# Patient Record
Sex: Female | Born: 1965 | Race: White | Hispanic: No | Smoking: Former smoker
Health system: Southern US, Community
[De-identification: ages and names within clinical notes are randomized; demographics above are authoritative.]

## PROBLEM LIST (undated history)

## (undated) DIAGNOSIS — C801 Malignant (primary) neoplasm, unspecified: Secondary | ICD-10-CM

## (undated) DIAGNOSIS — Z789 Other specified health status: Secondary | ICD-10-CM

## (undated) DIAGNOSIS — D649 Anemia, unspecified: Secondary | ICD-10-CM

## (undated) DIAGNOSIS — C539 Malignant neoplasm of cervix uteri, unspecified: Secondary | ICD-10-CM

## (undated) HISTORY — PX: MASS EXCISION: SHX2000

## (undated) HISTORY — PX: TONSILLECTOMY: SUR1361

## (undated) HISTORY — DX: Other specified health status: Z78.9

---

## 1998-04-24 ENCOUNTER — Emergency Department (HOSPITAL_COMMUNITY): Admission: EM | Admit: 1998-04-24 | Discharge: 1998-04-24 | Payer: Self-pay | Admitting: Internal Medicine

## 2008-08-10 ENCOUNTER — Emergency Department: Payer: Self-pay | Admitting: Emergency Medicine

## 2014-03-21 ENCOUNTER — Emergency Department: Payer: Self-pay | Admitting: Emergency Medicine

## 2018-12-07 DIAGNOSIS — J069 Acute upper respiratory infection, unspecified: Secondary | ICD-10-CM | POA: Diagnosis not present

## 2018-12-07 DIAGNOSIS — J0140 Acute pansinusitis, unspecified: Secondary | ICD-10-CM | POA: Diagnosis not present

## 2019-04-02 DIAGNOSIS — R109 Unspecified abdominal pain: Secondary | ICD-10-CM | POA: Diagnosis not present

## 2019-04-02 DIAGNOSIS — M545 Low back pain: Secondary | ICD-10-CM | POA: Diagnosis not present

## 2019-04-02 DIAGNOSIS — K5900 Constipation, unspecified: Secondary | ICD-10-CM | POA: Diagnosis not present

## 2019-04-03 DIAGNOSIS — R109 Unspecified abdominal pain: Secondary | ICD-10-CM | POA: Diagnosis not present

## 2019-04-06 ENCOUNTER — Telehealth: Payer: Self-pay | Admitting: Nurse Practitioner

## 2019-04-06 DIAGNOSIS — R1033 Periumbilical pain: Secondary | ICD-10-CM | POA: Diagnosis not present

## 2019-04-06 DIAGNOSIS — M545 Low back pain: Secondary | ICD-10-CM | POA: Diagnosis not present

## 2019-04-06 DIAGNOSIS — R1084 Generalized abdominal pain: Secondary | ICD-10-CM | POA: Diagnosis not present

## 2019-04-06 DIAGNOSIS — F17210 Nicotine dependence, cigarettes, uncomplicated: Secondary | ICD-10-CM | POA: Diagnosis not present

## 2019-04-06 DIAGNOSIS — R000 Tachycardia, unspecified: Secondary | ICD-10-CM | POA: Diagnosis not present

## 2019-04-06 DIAGNOSIS — R140 Abdominal distension (gaseous): Secondary | ICD-10-CM | POA: Diagnosis not present

## 2019-04-06 NOTE — Telephone Encounter (Signed)
Called pt to go over Script Screening for Covid-19, no answer left vm

## 2019-04-09 ENCOUNTER — Ambulatory Visit (INDEPENDENT_AMBULATORY_CARE_PROVIDER_SITE_OTHER): Payer: BLUE CROSS/BLUE SHIELD | Admitting: Nurse Practitioner

## 2019-04-09 ENCOUNTER — Other Ambulatory Visit: Payer: Self-pay

## 2019-04-09 ENCOUNTER — Ambulatory Visit
Admission: RE | Admit: 2019-04-09 | Discharge: 2019-04-09 | Disposition: A | Discharge status: Home / Self Care | Payer: BLUE CROSS/BLUE SHIELD | Source: Ambulatory Visit | Attending: Nurse Practitioner | Admitting: Nurse Practitioner

## 2019-04-09 ENCOUNTER — Encounter: Payer: Self-pay | Admitting: Nurse Practitioner

## 2019-04-09 VITALS — BP 126/80 | HR 94 | Temp 98.2°F | Ht 61.0 in | Wt 120.0 lb

## 2019-04-09 DIAGNOSIS — F17210 Nicotine dependence, cigarettes, uncomplicated: Secondary | ICD-10-CM | POA: Diagnosis not present

## 2019-04-09 DIAGNOSIS — Z1329 Encounter for screening for other suspected endocrine disorder: Secondary | ICD-10-CM | POA: Diagnosis not present

## 2019-04-09 DIAGNOSIS — M545 Low back pain, unspecified: Secondary | ICD-10-CM

## 2019-04-09 DIAGNOSIS — Z114 Encounter for screening for human immunodeficiency virus [HIV]: Secondary | ICD-10-CM

## 2019-04-09 DIAGNOSIS — R1084 Generalized abdominal pain: Secondary | ICD-10-CM

## 2019-04-09 DIAGNOSIS — Z13220 Encounter for screening for lipoid disorders: Secondary | ICD-10-CM

## 2019-04-09 DIAGNOSIS — E559 Vitamin D deficiency, unspecified: Secondary | ICD-10-CM

## 2019-04-09 LAB — UA/M W/RFLX CULTURE, ROUTINE
Bilirubin, UA: NEGATIVE
Glucose, UA: NEGATIVE
Ketones, UA: NEGATIVE
Leukocytes,UA: NEGATIVE
Nitrite, UA: NEGATIVE
Protein,UA: NEGATIVE
RBC, UA: NEGATIVE
Specific Gravity, UA: <1.005 — ABNORMAL LOW (ref 1.005–1.030)
Urobilinogen, Ur: 0.2 mg/dL (ref 0.2–1.0)
pH, UA: 6.5 (ref 5.0–7.5)

## 2019-04-09 MED ORDER — CYCLOBENZAPRINE HCL 10 MG PO TABS: 10.0000 mg | ORAL_TABLET | Freq: Three times a day (TID) | ORAL | 1 refills | Status: DC | PRN

## 2019-04-09 MED ORDER — MELOXICAM 7.5 MG PO TABS: 7.5000 mg | ORAL_TABLET | Freq: Two times a day (BID) | ORAL | 0 refills | Status: DC

## 2019-04-09 NOTE — Assessment & Plan Note (Signed)
I have recommended complete cessation of tobacco use. I have discussed various options available for assistance with tobacco cessation including over the counter methods (Nicotine gum, patch and lozenges). We also discussed prescription options (Chantix, Nicotine Inhaler / Nasal Spray). The patient is not interested in pursuing any prescription tobacco cessation options at this time.  

## 2019-04-09 NOTE — Patient Instructions (Signed)
Address: [address] [place], [place] [address] Phone: [phone]  Acute Back Pain, Adult Acute back pain is sudden and usually short-lived. It is often caused by an injury to the muscles and tissues in the back. The injury may result from:  A muscle or ligament getting overstretched or torn (strained). Ligaments are tissues that connect bones to each other. Lifting something improperly can cause a back strain.  Wear and tear (degeneration) of the spinal disks. Spinal disks are circular tissue that provides cushioning between the bones of the spine (vertebrae).  Twisting motions, such as while playing sports or doing yard work.  A hit to the back.  Arthritis. You may have a physical exam, lab tests, and imaging tests to find the cause of your pain. Acute back pain usually goes away with rest and home care. Follow these instructions at home: Managing pain, stiffness, and swelling  Take over-the-counter and prescription medicines only as told by your health care provider.  Your health care provider may recommend applying ice during the first 24-48 hours after your pain starts. To do this: ? Put ice in a plastic bag. ? Place a towel between your skin and the bag. ? Leave the ice on for 20 minutes, 2-3 times a day.  If directed, apply heat to the affected area as often as told by your health care provider. Use the heat source that your health care provider recommends, such as a moist heat pack or a heating pad. ? Place a towel between your skin and the heat source. ? Leave the heat on for 20-30 minutes. ? Remove the heat if your skin turns bright red. This is especially important if you are unable to feel pain, heat, or cold. You have a greater risk of getting burned. Activity   Do not stay in bed. Staying in bed for more than 1-2 days can delay your recovery.  Sit up and stand up straight. Avoid leaning forward when you sit, or hunching over when you stand. ? If you  work at a desk, sit close to it so you do not need to lean over. Keep your chin tucked in. Keep your neck drawn back, and keep your elbows bent at a right angle. Your arms should look like the letter "L." ? Sit high and close to the steering wheel when you drive. Add lower back (lumbar) support to your car seat, if needed.  Take short walks on even surfaces as soon as you are able. Try to increase the length of time you walk each day.  Do not sit, drive, or stand in one place for more than 30 minutes at a time. Sitting or standing for long periods of time can put stress on your back.  Do not drive or use heavy machinery while taking prescription pain medicine.  Use proper lifting techniques. When you bend and lift, use positions that put less stress on your back: ? [place] your knees. ? Keep the load close to your body. ? Avoid twisting.  Exercise regularly as told by your health care provider. Exercising helps your back heal faster and helps prevent back injuries by keeping muscles strong and flexible.  Work with a physical therapist to make a safe exercise program, as recommended by your health care provider. Do any exercises as told by your physical therapist. Lifestyle  Maintain a healthy weight. Extra weight puts stress on your back and makes it difficult to have good posture.  Avoid activities or  situations that make you feel anxious or stressed. Stress and anxiety increase muscle tension and can make back pain worse. Learn ways to manage anxiety and stress, such as through exercise. General instructions  Sleep on a firm mattress in a comfortable position. Try lying on your side with your knees slightly bent. If you lie on your back, put a pillow under your knees.  Follow your treatment plan as told by your health care provider. This may include: ? Cognitive or behavioral therapy. ? Acupuncture or massage therapy. ? Meditation or yoga. Contact a health care provider if:  You have  pain that is not relieved with rest or medicine.  You have increasing pain going down into your legs or buttocks.  Your pain does not improve after 2 weeks.  You have pain at night.  You lose weight without trying.  You have a fever or chills. Get help right away if:  You develop new bowel or bladder control problems.  You have unusual weakness or numbness in your arms or legs.  You develop nausea or vomiting.  You develop abdominal pain.  You feel faint. Summary  Acute back pain is sudden and usually short-lived.  Use proper lifting techniques. When you bend and lift, use positions that put less stress on your back.  Take over-the-counter and prescription medicines and apply heat or ice as directed by your health care provider. This information is not intended to replace advice given to you by your health care provider. Make sure you discuss any questions you have with your health care provider. Document Released: 10/25/2005 Document Revised: 06/01/2018 Document Reviewed: 06/08/2017 Elsevier Interactive Patient Education  2019 [employer].

## 2019-04-09 NOTE — Progress Notes (Signed)
New Patient Office Visit  Subjective:  Patient ID: Mariah Mcmahon, female    DOB: 03-01-1966  Age: 53 y.o. MRN: [id number]  CC:  Chief Complaint  Patient presents with  . New Patient (Initial Visit)  . Back Injury    Injured x 4 weeks ago. Did not let it rest after hurting it. Was initially 6/10, after continuing to press it was 11/10. Pain radiating down legs. Some bloating. Did see [facility] and then [facility]   . Health Maintenance    Will come back to address health maintenacne.     HPI Mariah Mcmahon presents for new patient visit to establish care.  Introduced to Designer, jewellery role and practice setting.  All questions answered.  Reports abdominal and back pain started 4 weeks ago.  She was working out at home, thought "I had just pulled something".  Then it began getting worse..  Reports pain started getting worse, thought it was a pinch nerve or herniated disc as pain started going different places.  States when the back pain comes "then the stomach bloat starts".  States they did do labs and imaging at recent UC visit at [place], near [facility].  States imaging was "only of belly", but this was "normal they told me".  No access to these records in [place].  At home her work out includes HIITT training, but due to gym being closed she had been doing work-out at home.  At this time she reports bother the back and the abdominal pain have improved some over past week from previous discomfort.  BACK PAIN Seen at [facility] ED on 04/06/2019 for abdominal pain and lower back pain.  Declined labs at ED visit as she reported she had labs a few days prior at [facility], but no notes in Care Everywhere on this visit.  Was prescribed Colace and Meloxicam, they also recommended referral to GI by her PCP.  At time she had no PCP until initial visit with this provider today.  Duration: weeks Mechanism of injury: exercising at home 4 weeks ago Location: low back , on both sides "right by my spine" Onset:  gradual Severity: 10/10 Quality: dull, aching and throbbing Frequency: constant Radiation: R leg above the knee and L leg above the knee Aggravating factors: laying Alleviating factors: ice, heat and NSAIDs & Meloxicam, but after in her for 10-12 hours the pain starts again Status: fluctuating Treatments attempted: rest, ice, heat and ibuprofen  Relief with NSAIDs?: moderate Nighttime pain:  yes Paresthesias / decreased sensation:  no Bowel / bladder incontinence:  no Fevers:  no Dysuria / urinary frequency:  no  ABDOMINAL PAIN  Seen at [facility] ED on 04/06/2019 for abdominal pain. Declined labs at ED visit as she reported she had labs a few days prior at [facility], but no notes in Care Everywhere on this visit.  Was prescribed Colace and Meloxicam, they also recommended referral to GI by her PCP.  At time she had no PCP until initial visit with this provider today. States the back pain started before the abdominal pain and feels two are related.  She reports she feels part of it is she went from "working out 6 days a week to barely nothing", feels that this affect bowel.  Currently has BM once a day, prior to Covid she had been having twice a day.  No straining.  Denies loose stools or constipation.   Duration:weeks Onset: gradual Severity: 10/10 Quality: dull and aching Location:  suprapubic". "  lower abdominal quadrants  Episode duration:  Radiation: yes, down into groin Frequency: intermittent Alleviating factors: Meloxicam, but after in her for 10-12 hours the pain starts again Aggravating factors: movement Status: fluctuating Treatments attempted: none Fever: no Nausea: no Vomiting: no Weight loss: no Decreased appetite: no Diarrhea: no Constipation: no Blood in stool: no Heartburn: no Jaundice: no Rash: no Dysuria/urinary frequency: no Hematuria: no History of sexually transmitted disease: no Recurrent NSAID use: only recently   NICOTINE DEPENDENCE: Is a current 1/2 PPD  smoker.  Discussed options for quiting and she is not interested at this time.  Agrees to preventative referrals at next visit, including colonoscopy and mammogram.  Discussed CT scan at age 33 for lung CA screening.  History reviewed. No pertinent past medical history.  Past Surgical History:  Procedure Laterality Date  . MASS EXCISION     Throat  . TONSILLECTOMY      Family History  Problem Relation Age of Onset  . Brain cancer Mother 39  . Alcohol abuse Father   . Heart disease Father   . Prostate cancer Father   . Heart attack Father     Social History   Socioeconomic History  . Marital status: Divorced    Spouse name: Not on file  . Number of children: Not on file  . Years of education: Not on file  . Highest education level: Not on file  Occupational History  . Occupation: [employer]    Employer: [employer]  . Financial resource strain: Not hard at all  . Food insecurity:    Worry: Never true    Inability: Never true  . Transportation needs:    Medical: No    Non-medical: No  Tobacco Use  . Smoking status: Current Every Day Smoker    Packs/day: 0.50    Years: 30.00    Pack years: 15.00    Types: Cigarettes  . Smokeless tobacco: Never Used  Substance and Sexual Activity  . Alcohol use: Not Currently  . Drug use: Never  . Sexual activity: Yes    Comment: not at moment  Lifestyle  . Physical activity:    Days per week: 6 days    Minutes per session: 40 min  . Stress: Not at all  Relationships  . Social connections:    Talks on phone: More than three times a week    Gets together: More than three times a week    Attends religious service: Never    Active member of club or organization: No    Attends meetings of clubs or organizations: Never    Relationship status: Divorced  . Intimate partner violence:    Fear of current or ex partner: No    Emotionally abused: No    Physically abused: No    Forced sexual activity: No   Other Topics Concern  . Not on file  Social History Narrative  . Not on file    ROS Review of Systems  Constitutional: Negative for activity change, appetite change, diaphoresis, fatigue and fever.  Respiratory: Negative for cough, chest tightness and shortness of breath.   Cardiovascular: Negative for chest pain, palpitations and leg swelling.  Gastrointestinal: Positive for abdominal pain. Negative for abdominal distention, constipation, diarrhea, nausea and vomiting.  Endocrine: Negative for cold intolerance, heat intolerance, polydipsia, polyphagia and polyuria.  Genitourinary: Negative for decreased urine volume, difficulty urinating, dysuria, flank pain, frequency, urgency and vaginal discharge.  Musculoskeletal: Positive for back pain.  Neurological:  Negative for dizziness, syncope, weakness, light-headedness, numbness and headaches.  Psychiatric/Behavioral: Negative.     Objective:   Today's Vitals: BP 126/80   Pulse 94   Temp 98.2 F (36.8 C) (Oral)   Ht 5\' 1"  (1.549 m)   Wt 120 lb (54.4 kg)   LMP 04/06/2019   SpO2 99%   BMI 22.67 kg/m   Physical Exam Vitals signs and nursing note reviewed.  Constitutional:      General: She is awake.     Appearance: She is well-developed.  HENT:     Head: Normocephalic.     Right Ear: Hearing normal.     Left Ear: Hearing normal.     Nose: Nose normal.     Mouth/Throat:     Mouth: Mucous membranes are moist.  Eyes:     General: Lids are normal.        Right eye: No discharge.        Left eye: No discharge.     Conjunctiva/sclera: Conjunctivae normal.     Pupils: Pupils are equal, round, and reactive to light.  Neck:     Musculoskeletal: Normal range of motion and neck supple.     Thyroid: No thyromegaly.     Vascular: No carotid bruit or JVD.  Cardiovascular:     Rate and Rhythm: Normal rate and regular rhythm.     Heart sounds: Normal heart sounds. No murmur. No gallop.   Pulmonary:     Effort: Pulmonary effort is  normal.     Breath sounds: Normal breath sounds.  Abdominal:     General: Bowel sounds are normal.     Palpations: Abdomen is soft. There is no hepatomegaly or splenomegaly.     Tenderness: There is abdominal tenderness in the suprapubic area. There is no right CVA tenderness, left CVA tenderness, guarding or rebound. Negative signs include Murphy's sign.     Comments: Mild suprapubic tenderness reported.    Musculoskeletal:     Lumbar back: She exhibits normal range of motion, no tenderness, no edema, no laceration, no pain and no spasm.     Right lower leg: No edema.     Left lower leg: No edema.     Comments: Full ROM lower back with no report of pain or tenderness.  No rashes noted.  Lymphadenopathy:     Cervical: No cervical adenopathy.  Skin:    General: Skin is warm and dry.  Neurological:     Mental Status: She is alert and oriented to person, place, and time.  Psychiatric:        Attention and Perception: Attention normal.        Mood and Affect: Mood normal.        Behavior: Behavior normal. Behavior is cooperative.        Thought Content: Thought content normal.        Judgment: Judgment normal.     Assessment & Plan:   Problem List Items Addressed This Visit      Other   Generalized abdominal pain    Acute, suspect related to back injury.  Will obtain baseline labs and urine.  She refuses GI referral at this time, wishes to have back assessed first by ortho and at next visit she has agreed to referral to GI for colonoscopy and preventative screenings.  Return in 4 weeks.      Relevant Orders   UA/M w/rflx Culture, Routine   Acute midline low back pain without sciatica - Primary  Acute x 4 weeks, suspect musculoskeletal due to recent injury while working out.  Imagining lumbar spine ordered.  Flexeril script sent, to take at night only and instructed not to take if working or driving.  Ortho referral for further evaluation due to c/o pain at night and radiation to  abdomen, discussed that possible referral to PT may be placed dependent on imaging results.  Will obtain baseline labs and urine at visit today, which she agrees with.  Refill on Meloxicam, recommend minimal use only and to try Tylenol + alternating heat/ice + rest at home.  Recommend use of back support while at work.  Return in 4 weeks.      Relevant Medications   cyclobenzaprine (FLEXERIL) 10 MG tablet   meloxicam (MOBIC) 7.5 MG tablet   Other Relevant Orders   DG Lumbar Spine Complete   CBC with Differential/Platelet   Comprehensive metabolic panel   Ambulatory referral to Orthopedics   Nicotine dependence, cigarettes, uncomplicated    I have recommended complete cessation of tobacco use. I have discussed various options available for assistance with tobacco cessation including over the counter methods (Nicotine gum, patch and lozenges). We also discussed prescription options (Chantix, Nicotine Inhaler / Nasal Spray). The patient is not interested in pursuing any prescription tobacco cessation options at this time.       Other Visit Diagnoses    Vitamin D deficiency       Reports h/o low level in past, recheck today.   Relevant Orders   VITAMIN D 25 Hydroxy (Vit-D Deficiency, Fractures)   Thyroid disorder screen       Since obtaining labs today patient agreed to initial screening labs along with labs for acute issues. TSH panel ordered.   Relevant Orders   TSH   Screening for HIV (human immunodeficiency virus)       Since obtaining labs today patient agreed to initial screening labs along with labs for acute issues. HIV screen ordered.   Relevant Orders   HIV Antibody (routine testing w rflx)   Screening cholesterol level       Since obtaining labs today patient agreed to initial screening labs along with labs for acute issues. Lipid panel ordered.   Relevant Orders   Lipid Panel w/o Chol/HDL Ratio      Outpatient Encounter Medications as of 04/09/2019  Medication Sig  .  meloxicam (MOBIC) 7.5 MG tablet Take 1 tablet (7.5 mg total) by mouth 2 (two) times a day.  . [DISCONTINUED] meloxicam (MOBIC) 7.5 MG tablet Take 7.5 mg by mouth 2 (two) times a day.  . cyclobenzaprine (FLEXERIL) 10 MG tablet Take 1 tablet (10 mg total) by mouth 3 (three) times daily as needed for muscle spasms.  Marland Kitchen docusate sodium (COLACE) 100 MG capsule Take by mouth.  . [DISCONTINUED] meloxicam (MOBIC) 7.5 MG tablet    No facility-administered encounter medications on file as of 04/09/2019.     Follow-up: Return in about 4 weeks (around 05/07/2019) for Annual physical.   Venita Lick, NP

## 2019-04-09 NOTE — Assessment & Plan Note (Signed)
Acute x 4 weeks, suspect musculoskeletal due to recent injury while working out.  Imagining lumbar spine ordered.  Flexeril script sent, to take at night only and instructed not to take if working or driving.  Ortho referral for further evaluation due to c/o pain at night and radiation to abdomen, discussed that possible referral to PT may be placed dependent on imaging results.  Will obtain baseline labs and urine at visit today, which she agrees with.  Refill on Meloxicam, recommend minimal use only and to try Tylenol + alternating heat/ice + rest at home.  Recommend use of back support while at work.  Return in 4 weeks.

## 2019-04-09 NOTE — Assessment & Plan Note (Signed)
Acute, suspect related to back injury.  Will obtain baseline labs and urine.  She refuses GI referral at this time, wishes to have back assessed first by ortho and at next visit she has agreed to referral to GI for colonoscopy and preventative screenings.  Return in 4 weeks.

## 2019-04-10 LAB — COMPREHENSIVE METABOLIC PANEL
ALT: 6 IU/L (ref 0–32)
AST: 24 IU/L (ref 0–40)
Albumin/Globulin Ratio: 1.4 (ref 1.2–2.2)
Albumin: 4 g/dL (ref 3.8–4.8)
Alkaline Phosphatase: 79 IU/L (ref 39–117)
BUN/Creatinine Ratio: 18 (ref 9–23)
BUN: 12 mg/dL (ref 6–24)
Bilirubin Total: 0.4 mg/dL (ref 0.0–1.2)
CO2: 23 mmol/L (ref 20–29)
Calcium: 9.4 mg/dL (ref 8.7–10.2)
Chloride: 99 mmol/L (ref 96–106)
Creatinine, Ser: 0.65 mg/dL (ref 0.57–1.00)
GFR calc Af Amer: 120 mL/min/{1.73_m2} (ref >59)
GFR calc non Af Amer: 104 mL/min/{1.73_m2} (ref >59)
Globulin, Total: 2.8 g/dL (ref 1.5–4.5)
Glucose: 87 mg/dL (ref 65–99)
Potassium: 4.5 mmol/L (ref 3.5–5.2)
Sodium: 138 mmol/L (ref 134–144)
Total Protein: 6.8 g/dL (ref 6.0–8.5)

## 2019-04-10 LAB — HIV ANTIBODY (ROUTINE TESTING W REFLEX): HIV Screen 4th Generation wRfx: NONREACTIVE

## 2019-04-10 LAB — CBC WITH DIFFERENTIAL/PLATELET
Basophils Absolute: 0.1 10*3/uL (ref 0.0–0.2)
Basos: 1 %
EOS (ABSOLUTE): 0.2 10*3/uL (ref 0.0–0.4)
Eos: 2 %
Hematocrit: 44.2 % (ref 34.0–46.6)
Hemoglobin: 14.5 g/dL (ref 11.1–15.9)
Immature Grans (Abs): 0 10*3/uL (ref 0.0–0.1)
Immature Granulocytes: 0 %
Lymphocytes Absolute: 1.3 10*3/uL (ref 0.7–3.1)
Lymphs: 16 %
MCH: 32.4 pg (ref 26.6–33.0)
MCHC: 32.8 g/dL (ref 31.5–35.7)
MCV: 99 fL — ABNORMAL HIGH (ref 79–97)
Monocytes Absolute: 0.7 10*3/uL (ref 0.1–0.9)
Monocytes: 9 %
Neutrophils Absolute: 5.9 10*3/uL (ref 1.4–7.0)
Neutrophils: 72 %
Platelets: 401 10*3/uL (ref 150–450)
RBC: 4.48 x10E6/uL (ref 3.77–5.28)
RDW: 13 % (ref 11.7–15.4)
WBC: 8.2 10*3/uL (ref 3.4–10.8)

## 2019-04-10 LAB — TSH: TSH: 1.26 u[IU]/mL (ref 0.450–4.500)

## 2019-04-10 LAB — LIPID PANEL W/O CHOL/HDL RATIO
Cholesterol, Total: 181 mg/dL (ref 100–199)
HDL: 55 mg/dL (ref >39)
LDL Calculated: 109 mg/dL — ABNORMAL HIGH (ref 0–99)
Triglycerides: 85 mg/dL (ref 0–149)
VLDL Cholesterol Cal: 17 mg/dL (ref 5–40)

## 2019-04-10 LAB — VITAMIN D 25 HYDROXY (VIT D DEFICIENCY, FRACTURES): Vit D, 25-Hydroxy: 36.5 ng/mL (ref 30.0–100.0)

## 2019-04-13 ENCOUNTER — Other Ambulatory Visit: Payer: Self-pay | Admitting: Nurse Practitioner

## 2019-04-13 MED ORDER — METHOCARBAMOL 500 MG PO TABS: 500.0000 mg | ORAL_TABLET | Freq: Two times a day (BID) | ORAL | 0 refills | Status: DC | PRN

## 2019-04-13 NOTE — Progress Notes (Signed)
Robaxin script for muscle pain.

## 2019-04-16 ENCOUNTER — Telehealth: Payer: Self-pay | Admitting: Nurse Practitioner

## 2019-04-16 NOTE — Telephone Encounter (Signed)
Called pt to go over script screening for covid-19 no answer left vm °

## 2019-04-16 NOTE — Telephone Encounter (Signed)
Appointment scheduled.

## 2019-04-17 ENCOUNTER — Other Ambulatory Visit: Payer: Self-pay

## 2019-04-17 ENCOUNTER — Ambulatory Visit (INDEPENDENT_AMBULATORY_CARE_PROVIDER_SITE_OTHER): Payer: BC Managed Care – PPO | Admitting: Nurse Practitioner

## 2019-04-17 ENCOUNTER — Other Ambulatory Visit (HOSPITAL_COMMUNITY)
Admission: RE | Admit: 2019-04-17 | Discharge: 2019-04-17 | Disposition: A | Discharge status: Home / Self Care | Payer: BC Managed Care – PPO | Source: Ambulatory Visit | Attending: Nurse Practitioner | Admitting: Nurse Practitioner

## 2019-04-17 ENCOUNTER — Encounter: Payer: Self-pay | Admitting: Nurse Practitioner

## 2019-04-17 VITALS — BP 129/79 | HR 88 | Temp 98.9°F | Ht 61.0 in | Wt 122.0 lb

## 2019-04-17 DIAGNOSIS — R1084 Generalized abdominal pain: Secondary | ICD-10-CM | POA: Insufficient documentation

## 2019-04-17 LAB — UA/M W/RFLX CULTURE, ROUTINE
Bilirubin, UA: NEGATIVE
Glucose, UA: NEGATIVE
Ketones, UA: NEGATIVE
Leukocytes,UA: NEGATIVE
Nitrite, UA: NEGATIVE
Protein,UA: NEGATIVE
Specific Gravity, UA: <1.005 — ABNORMAL LOW (ref 1.005–1.030)
Urobilinogen, Ur: 0.2 mg/dL (ref 0.2–1.0)
pH, UA: 6 (ref 5.0–7.5)

## 2019-04-17 LAB — PREGNANCY, URINE: Preg Test, Ur: NEGATIVE

## 2019-04-17 LAB — MICROSCOPIC EXAMINATION
Bacteria, UA: NONE SEEN
RBC, Urine: NONE SEEN /hpf (ref 0–2)
WBC, UA: NONE SEEN /hpf (ref 0–5)

## 2019-04-17 NOTE — Assessment & Plan Note (Addendum)
Acute, back pain improved but now reports ongoing bloating & bleeding after recent period and noticing "knot" in lower abdomen.  Pap obtained, although had scant blood on sample; will follow results.  UA positive for blood, but negative otherwise.  Pregnancy testing negative.  Urgent referral placed to GYN due to ongoing abdominal discomfort and recent continued bleeding + findings on exam today.  Will benefit from further assessment and recommendations.  Recent CBC with WNL H/H, MCV 99.  Return in 2 weeks for follow-up or sooner if worsening symptoms present.

## 2019-04-17 NOTE — Progress Notes (Signed)
BP 129/79   Pulse 88   Temp 98.9 F (37.2 C) (Oral)   Ht 5\' 1"  (1.549 m)   Wt 122 lb (55.3 kg)   LMP 04/06/2019   SpO2 99%   BMI 23.05 kg/m    Subjective:    Patient ID: Mariah Mcmahon, female    DOB: 08-04-1966, 53 y.o.   MRN: [id number]  HPI: Mariah Mcmahon is a 53 y.o. female  Chief Complaint  Patient presents with  . Abdominal Pain    x 5 weeks. Worsening. Feels like she is 3 months pregnant due to bloating.    ABDOMINAL PAIN & VAGINAL BLEEDING      Seen 04/09/2019 for new patient visit and complaint of back pain and abdominal pain.  Reports back pain has improved, but continues to have abdominal pain.  Had been seen at [facility] ED on 04/06/2019 and she declined labs and was given Meloxicam and Colace.  Reports pain when she sits, like a knot in her lower abdomen and bloating.  States the "knot" is in suprapubic area, she has never noticed it before and feels it has gotten bigger.  States knot at times move.      States last month, May, had an extended period (heavy for 3 days and then "kept bleeding" == total of 10 days) and then recent period (LMP 1 1/2 week ago) was normal (one week, heavy for first two to three days == baseline for her), although she reports she continues to bleed sporadically since this time.  Is using tampon today, but reports light periodic bleeding today. At baseline is having to use tampon or pad daily with ongoing bleeding, mostly light flow but had one episode that was slightly heavier.  States this ongoing bleeding is very light, but occasionally when has  bowel movement she notices more bright red blood from vagina. Did not have this pattern in May, no continued bleeding after her period in May.  Recent period is the first time she has continued to bleed, she has noticed continued bleeding every day at "some point".  No pap smear in awhile, "could not tell how long it has been".  Presently not been taking Colace.  On regular basis has two bowel movements a day, no  straining.   She states with pain medicine abdominal discomfort improves at this time.  Reports she can sleep now, which she could not do before.  States pain is not as severe as she felt when first visited provider.  Has noticed bloating over past month, especially since recent ongoing bleeding. No sexual activity in 3 months and no recent STD.  She denies SOB, CP, N&V, or dizziness.  Continues to smoke every day.  Duration:weeks Onset: gradual Severity: 6/10 at worst and 2/10 at best Quality: sharp and aching Location:  suprapubic". "lower abdominal quadrants  Episode duration: all day wax and wane Radiation: no Frequency: intermittent Alleviating factors: muscle relaxers and Motrin Aggravating factors: unknown Status: fluctuating Treatments attempted: none Fever: no Nausea: no Vomiting: no Weight loss: no Decreased appetite: no Diarrhea: no Constipation: no Blood in stool: no Heartburn: no Jaundice: no Rash: no Dysuria/urinary frequency: no Hematuria: no History of sexually transmitted disease: no Recurrent NSAID use: no  Relevant past medical, surgical, family and social history reviewed and updated as indicated. Interim medical history since our last visit reviewed. Allergies and medications reviewed and updated.  Review of Systems  Constitutional: Negative for activity change, appetite change, diaphoresis, fatigue and fever.  Respiratory: Negative for cough, chest tightness and shortness of breath.   Cardiovascular: Negative for chest pain, palpitations and leg swelling.  Gastrointestinal: Positive for abdominal distention and abdominal pain. Negative for constipation, diarrhea, nausea and vomiting.  Endocrine: Negative for cold intolerance and heat intolerance.  Genitourinary: Positive for menstrual problem and vaginal bleeding. Negative for dysuria, flank pain, hematuria, pelvic pain, urgency, vaginal discharge and vaginal pain.  Neurological: Negative for dizziness,  syncope, weakness, light-headedness, numbness and headaches.  Psychiatric/Behavioral: Negative.     Per HPI unless specifically indicated above     Objective:    BP 129/79   Pulse 88   Temp 98.9 F (37.2 C) (Oral)   Ht 5\' 1"  (1.549 m)   Wt 122 lb (55.3 kg)   LMP 04/06/2019   SpO2 99%   BMI 23.05 kg/m   Wt Readings from Last 3 Encounters:  04/17/19 122 lb (55.3 kg)  04/09/19 120 lb (54.4 kg)    Physical Exam Vitals signs and nursing note reviewed.  Constitutional:      General: She is awake. She is not in acute distress.    Appearance: She is well-developed. She is not ill-appearing.  HENT:     Head: Normocephalic.     Right Ear: Hearing normal.     Left Ear: Hearing normal.     Nose: Nose normal.     Mouth/Throat:     Mouth: Mucous membranes are moist.  Eyes:     General: Lids are normal.        Right eye: No discharge.        Left eye: No discharge.     Conjunctiva/sclera: Conjunctivae normal.     Pupils: Pupils are equal, round, and reactive to light.  Neck:     Musculoskeletal: Normal range of motion and neck supple.     Thyroid: No thyromegaly.     Vascular: No carotid bruit.  Cardiovascular:     Rate and Rhythm: Normal rate and regular rhythm.     Heart sounds: Normal heart sounds. No murmur. No gallop.   Pulmonary:     Effort: Pulmonary effort is normal. No accessory muscle usage or respiratory distress.     Breath sounds: Normal breath sounds.  Abdominal:     General: Bowel sounds are normal.     Palpations: Abdomen is soft. There is no hepatomegaly or splenomegaly.     Hernia: There is no hernia in the right inguinal area or left inguinal area.  Genitourinary:    Exam position: Lithotomy position.     Labia:        Right: No rash.        Left: No rash.      Vagina: Normal.     Cervix: Cervical bleeding present. No cervical motion tenderness, discharge or erythema.     Adnexa: Right adnexa normal and left adnexa normal.     Comments: Uterus size  on palpation slightly larger than a grapefruit, had patient palpate abdomen and she endorses "knot" she has noticed is same area as uterus which she reports not noticing before.  Noted scant old brown blood in vaginal vault and on speculum.  Cervix viewed and pap obtained.  On internal exam cervix midline and firm to palpation with no CMT. Musculoskeletal:     Right lower leg: No edema.     Left lower leg: No edema.  Skin:    General: Skin is warm and dry.  Neurological:     Mental Status:  She is alert and oriented to person, place, and time.  Psychiatric:        Attention and Perception: Attention normal.        Mood and Affect: Mood normal.        Behavior: Behavior normal. Behavior is cooperative.        Thought Content: Thought content normal.        Judgment: Judgment normal.     Results for orders placed or performed in visit on 04/17/19  Microscopic Examination  Result Value Ref Range   WBC, UA None seen 0 - 5 /hpf   RBC None seen 0 - 2 /hpf   Epithelial Cells (non renal) 0-10 0 - 10 /hpf   Bacteria, UA None seen None seen/Few  UA/M w/rflx Culture, Routine  Result Value Ref Range   Specific Gravity, UA <1.005 (L) 1.005 - 1.030   pH, UA 6.0 5.0 - 7.5   Color, UA Yellow Yellow   Appearance Ur Clear Clear   Leukocytes,UA Negative Negative   Protein,UA Negative Negative/Trace   Glucose, UA Negative Negative   Ketones, UA Negative Negative   RBC, UA Trace (A) Negative   Bilirubin, UA Negative Negative   Urobilinogen, Ur 0.2 0.2 - 1.0 mg/dL   Nitrite, UA Negative Negative   Microscopic Examination See below:   Pregnancy, urine  Result Value Ref Range   Preg Test, Ur Negative Negative      Assessment & Plan:   Problem List Items Addressed This Visit      Other   Generalized abdominal pain - Primary    Acute, back pain improved but now reports ongoing bloating & bleeding after recent period and noticing "knot" in lower abdomen.  Pap obtained, although had scant blood  on sample; will follow results.  UA positive for blood, but negative otherwise.  Pregnancy testing negative.  Urgent referral placed to GYN due to ongoing abdominal discomfort and recent continued bleeding + findings on exam today.  Will benefit from further assessment and recommendations.  Recent CBC with WNL H/H, MCV 99.  Return in 2 weeks for follow-up or sooner if worsening symptoms present.      Relevant Orders   UA/M w/rflx Culture, Routine (Completed)   Pregnancy, urine (Completed)   Ambulatory referral to Gynecology   Cytology - PAP       Follow up plan: Return in about 2 weeks (around 05/01/2019) for follow-up vaginal bleeding.

## 2019-04-17 NOTE — Patient Instructions (Signed)
Abnormal Uterine Bleeding  Abnormal uterine bleeding means bleeding more than usual from your uterus. It can include:   Bleeding between periods.   Bleeding after sex.   Bleeding that is heavier than normal.   Periods that last longer than usual.   Bleeding after you have stopped having your period (menopause).  There are many problems that may cause this. You should see a doctor for any kind of bleeding that is not normal. Treatment depends on the cause of the bleeding.  Follow these instructions at home:   Watch your condition for any changes.   Do not use tampons, douche, or have sex, if your doctor tells you not to.   Change your pads often.   Get regular well-woman exams. Make sure they include a pelvic exam and cervical cancer screening.   Keep all follow-up visits as told by your doctor. This is important.  Contact a doctor if:   The bleeding lasts more than one week.   You feel dizzy at times.   You feel like you are going to throw up (nauseous).   You throw up.  Get help right away if:   You pass out.   You have to change pads every hour.   You have belly (abdominal) pain.   You have a fever.   You get sweaty.   You get weak.   You passing large blood clots from your vagina.  Summary   Abnormal uterine bleeding means bleeding more than usual from your uterus.   There are many problems that may cause this. You should see a doctor for any kind of bleeding that is not normal.   Treatment depends on the cause of the bleeding.  This information is not intended to replace advice given to you by your health care provider. Make sure you discuss any questions you have with your health care provider.  Document Released: 08/22/2009 Document Revised: 10/19/2016 Document Reviewed: 10/19/2016  Elsevier Interactive Patient Education  2019 [employer] Inc[employer]

## 2019-04-20 LAB — CYTOLOGY - PAP: HPV: DETECTED — AB

## 2019-04-24 ENCOUNTER — Encounter: Payer: Self-pay | Admitting: Obstetrics and Gynecology

## 2019-04-24 ENCOUNTER — Ambulatory Visit (INDEPENDENT_AMBULATORY_CARE_PROVIDER_SITE_OTHER): Payer: BC Managed Care – PPO | Admitting: Obstetrics and Gynecology

## 2019-04-24 ENCOUNTER — Other Ambulatory Visit: Payer: Self-pay

## 2019-04-24 ENCOUNTER — Other Ambulatory Visit (HOSPITAL_COMMUNITY)
Admission: RE | Admit: 2019-04-24 | Discharge: 2019-04-24 | Disposition: A | Discharge status: Home / Self Care | Payer: BC Managed Care – PPO | Source: Ambulatory Visit | Attending: Obstetrics and Gynecology | Admitting: Obstetrics and Gynecology

## 2019-04-24 VITALS — BP 146/90 | HR 123 | Ht 62.0 in | Wt 118.0 lb

## 2019-04-24 DIAGNOSIS — B977 Papillomavirus as the cause of diseases classified elsewhere: Secondary | ICD-10-CM

## 2019-04-24 DIAGNOSIS — K6289 Other specified diseases of anus and rectum: Secondary | ICD-10-CM | POA: Insufficient documentation

## 2019-04-24 DIAGNOSIS — R102 Pelvic and perineal pain: Secondary | ICD-10-CM | POA: Diagnosis not present

## 2019-04-24 DIAGNOSIS — N939 Abnormal uterine and vaginal bleeding, unspecified: Secondary | ICD-10-CM

## 2019-04-24 DIAGNOSIS — N72 Inflammatory disease of cervix uteri: Secondary | ICD-10-CM | POA: Diagnosis not present

## 2019-04-24 DIAGNOSIS — D060 Carcinoma in situ of endocervix: Secondary | ICD-10-CM | POA: Diagnosis not present

## 2019-04-24 DIAGNOSIS — R87613 High grade squamous intraepithelial lesion on cytologic smear of cervix (HGSIL): Secondary | ICD-10-CM | POA: Diagnosis not present

## 2019-04-24 DIAGNOSIS — D069 Carcinoma in situ of cervix, unspecified: Secondary | ICD-10-CM | POA: Diagnosis not present

## 2019-04-24 DIAGNOSIS — N888 Other specified noninflammatory disorders of cervix uteri: Secondary | ICD-10-CM

## 2019-04-24 NOTE — Progress Notes (Signed)
Obstetrics & Gynecology Office Visit   Chief Complaint:  Chief Complaint  Patient presents with  . Vaginal Bleeding    referred by Mariah Mcmahon PCP    History of Present Illness:Mariah Mcmahon is a 53 y.o. woman who presents today in consultation at the request of Mariah Guarneri, NP at [facility] for AUB, pelvic pain, and pap obtained on 04/17/2019 showing HGSIL and HPV positive.  Denies a history of prior abnormal pap smears but some time since her last pap  BUN/Creatnine normal on 04/09/2019.  Reports abdominal pain, back pain, and distention over the past month.  Menses have become heavy and painful with irregular bleeding off and on.  She is having some bleeding today.  Pain is present outside of menses. She is a smoker   Pap/Treatment History: No prior pap available  Review of Systems: Review of Systems  Constitutional: Negative.   Gastrointestinal: Positive for abdominal pain.  Genitourinary: Negative.   Musculoskeletal: Positive for back pain.     Past Medical History:  Past Medical History:  Diagnosis Date  . No pertinent past medical history     Past Surgical History:  Past Surgical History:  Procedure Laterality Date  . MASS EXCISION     Throat  . TONSILLECTOMY      Gynecologic History: Patient's last menstrual period was 04/06/2019.  Obstetric History: G2P0011  Family History:  Family History  Problem Relation Age of Onset  . Brain cancer Mother 73  . Alcohol abuse Father   . Heart disease Father   . Prostate cancer Father   . Heart attack Father     Social History:  Social History   Socioeconomic History  . Marital status: Divorced    Spouse name: Not on file  . Number of children: Not on file  . Years of education: Not on file  . Highest education level: Not on file  Occupational History  . Occupation: [employer]    Employer: [name]  . Financial resource strain: Not hard at all  . Food  insecurity    Worry: Never true    Inability: Never true  . Transportation needs    Medical: No    Non-medical: No  Tobacco Use  . Smoking status: Current Every Day Smoker    Packs/day: 0.50    Years: 30.00    Pack years: 15.00    Types: Cigarettes  . Smokeless tobacco: Never Used  Substance and Sexual Activity  . Alcohol use: Not Currently  . Drug use: Never  . Sexual activity: Yes    Comment: not at moment  Lifestyle  . Physical activity    Days per week: 6 days    Minutes per session: 40 min  . Stress: Not at all  Relationships  . Social connections    Talks on phone: More than three times a week    Gets together: More than three times a week    Attends religious service: Never    Active member of club or organization: No    Attends meetings of clubs or organizations: Never    Relationship status: Divorced  . Intimate partner violence    Fear of current or ex partner: No    Emotionally abused: No    Physically abused: No    Forced sexual activity: No  Other Topics Concern  . Not on file  Social History Narrative  . Not on file    Allergies:  No Known Allergies  Medications: Prior to Admission medications   Medication Sig Start Date End Date Taking? Authorizing Provider  cyclobenzaprine (FLEXERIL) 10 MG tablet Take 1 tablet (10 mg total) by mouth 3 (three) times daily as needed for muscle spasms. 04/09/19  Yes Cannady, Jolene T, NP  meloxicam (MOBIC) 7.5 MG tablet Take 1 tablet (7.5 mg total) by mouth 2 (two) times a day. 04/09/19  Yes Cannady, Jolene T, NP  methocarbamol (ROBAXIN) 500 MG tablet Take 1 tablet (500 mg total) by mouth 2 (two) times daily as needed for muscle spasms (for lower back pain). 04/13/19  Yes Venita Lick, NP    Physical Exam Vitals:  Vitals:   04/24/19 1505  BP: (!) 146/90  Pulse: (!) 123   Patient's last menstrual period was 04/06/2019.  General: NAD HEENT: normocephalic, anicteric Thyroid: no enlargement, no palpable nodules  Abdomen: soft, suprapubic tenderness, no rebound, no guarding, umbilicus normal without lesions particularly no sister Wynona Schiefelbein node.   Pulmonary: No increased work of breathing Genitourinary:  External: Normal external female genitalia.  Normal urethral meatus, normal  Bartholin's and Skene's glands.    Vagina: Normal vaginal mucosa, no evidence of prolapse.    Cervix: Grossly abnormal in appearance, the cervix is deviated to the patient's right secondary to enlargement and mass effect of the left aspect of the cervix, tissue around the cervical os is friable with contact bleeding noted. Firm barrel shaped cervix on exam, no clear parametrial involvement noted.  Uterus: Irregular contour, non-mobile.  No CMT  Adnexa: ovaries non-enlarged, no adnexal masses  Rectal: deferred  Lymphatic: no evidence of inguinal lymphadenopathy Extremities: no edema, erythema, or tenderness Neurologic: Grossly intact Psychiatric: mood appropriate, affect full  Female chaperone present for pelvic and breast  portions of the physical exam   GYNECOLOGY CLINIC COLPOSCOPY PROCEDURE NOTE  53 y.o. [id number] here for colposcopy for high-grade squamous intraepithelial neoplasia  (HGSIL-encompassing moderate and severe dysplasia)  pap smear on 04/16/18. Discussed underlying role for HPV infection in the development of cervical dysplasia, its natural history and progression/regression, need for surveillance.  Is the patient  pregnant: No LMP: Patient's last menstrual period was 04/06/2019. Smoking status:  reports that she has been smoking cigarettes. She has a 15.00 pack-year smoking history. She has never used smokeless tobacco.  Patient given informed consent, signed copy in the chart, time out was performed.  The patient was position in dorsal lithotomy position. Speculum was placed the cervix was visualized.   After application of acetic acid colposcopic inspection of the cervix was undertaken.   Colposcopy  adequate, full visualization of transformation zone: No, secondary to angle of cervix and mass effect of left portion of the cervix Cervix grossly enlarged, friable with contact bleeding; corresponding biopsies obtained at 12 O'Clock, 3 O'clock, and 5 O'Clock.   ECC specimen obtained:  Yes  All specimens were labeled and sent to pathology.   Patient was given post procedure instructions.  Will follow up pathology and manage accordingly.  Routine preventative health maintenance measures emphasized.  Assessment: 53 y.o. G2P0011 follow up for HSIL HPV positive pap  Plan: Problem List Items Addressed This Visit    None    Visit Diagnoses    Abnormal uterine bleeding    -  Primary   Relevant Orders   US Transvaginal Non-OB   Pelvic pain in female       Relevant Orders   US Transvaginal Non-OB   HSIL (high grade squamous intraepithelial lesion) on  Pap smear of cervix       Relevant Orders   Surgical pathology   High risk human papilloma virus (HPV) infection of cervix       Relevant Orders   Surgical pathology      - Colposcopy conducted today.  Exam is very concerning with barell shaped cervix for uterine carcinoma.  A cervical fibroid is in the differential but deemed less likely given overall appearance and HSIL pap.   I shared my concerns with the patient and discussed that I would be contacting her once biopsy results are available.  Should we have normal pathology will proceed with TVUS to further work up.  - I had a lengthly discussion with Aaren D Mcmahon  regarding the cause of dysplasia of the lower genital tract (including immunosuppression in the setting of HPV exposure and tobacco exposure). I explained the potential for progression to invasive malignancy, the recurrent nature of these lesions (and the need for close continued followup). Results of today's pap will dictate need for further evaluation and follow up per ASCCP guidelines..  - She is comfortable with the plan and had  her questions answered.  - Return in about 1 week (around 05/01/2019) for GYN and follow up TVUS.   Malachy Mood, MD, [facility], [facility] 04/24/2019, 5:30 PM

## 2019-04-25 ENCOUNTER — Telehealth: Payer: Self-pay

## 2019-04-25 NOTE — Telephone Encounter (Signed)
Saw them on Friday and spoke to patient.  She saw GYN yesterday and they did colposcopy to assess for cervical CA.  Thank you.

## 2019-04-25 NOTE — Telephone Encounter (Signed)
Incoming call from [facility] with [facility] pathology, wanted to let us know that the pap was abnormal, please see results

## 2019-04-27 ENCOUNTER — Other Ambulatory Visit: Payer: Self-pay | Admitting: Obstetrics and Gynecology

## 2019-04-27 DIAGNOSIS — C530 Malignant neoplasm of endocervix: Secondary | ICD-10-CM

## 2019-04-27 DIAGNOSIS — D4959 Neoplasm of unspecified behavior of other genitourinary organ: Secondary | ICD-10-CM

## 2019-04-27 NOTE — Progress Notes (Signed)
Referral received from Dr. Georgianne Fick. He would like her to be seen 6/24 if possible. Records reviewed. Requested Ct CAP prior to visit if possible. We will arrange for appointment 6/24 at 1345. New patient coordinator will contact with the details.

## 2019-05-01 ENCOUNTER — Ambulatory Visit
Admission: RE | Admit: 2019-05-01 | Discharge: 2019-05-01 | Disposition: A | Discharge status: Home / Self Care | Payer: BC Managed Care – PPO | Source: Ambulatory Visit | Attending: Obstetrics and Gynecology | Admitting: Obstetrics and Gynecology

## 2019-05-01 ENCOUNTER — Other Ambulatory Visit: Payer: Self-pay

## 2019-05-01 DIAGNOSIS — D4959 Neoplasm of unspecified behavior of other genitourinary organ: Secondary | ICD-10-CM | POA: Diagnosis not present

## 2019-05-01 DIAGNOSIS — C539 Malignant neoplasm of cervix uteri, unspecified: Secondary | ICD-10-CM | POA: Diagnosis not present

## 2019-05-01 MED ORDER — IOHEXOL 300 MG/ML  SOLN
75.0000 mL | Freq: Once | INTRAMUSCULAR | Status: AC | PRN
  Administered 2019-05-01: 75 mL via INTRAVENOUS

## 2019-05-02 ENCOUNTER — Inpatient Hospital Stay
Admission: RE | Admit: 2019-05-02 | Discharge: 2019-05-02 | Disposition: A | Discharge status: Home / Self Care | Payer: BC Managed Care – PPO | Source: Ambulatory Visit | Attending: Radiation Oncology | Admitting: Radiation Oncology

## 2019-05-02 ENCOUNTER — Inpatient Hospital Stay: Payer: BC Managed Care – PPO | Attending: Obstetrics and Gynecology | Admitting: Obstetrics and Gynecology

## 2019-05-02 ENCOUNTER — Other Ambulatory Visit: Payer: Self-pay

## 2019-05-02 VITALS — BP 157/84 | HR 101 | Temp 99.0°F | Resp 20 | Ht 62.0 in | Wt 119.0 lb

## 2019-05-02 DIAGNOSIS — C530 Malignant neoplasm of endocervix: Secondary | ICD-10-CM | POA: Insufficient documentation

## 2019-05-02 DIAGNOSIS — C539 Malignant neoplasm of cervix uteri, unspecified: Secondary | ICD-10-CM | POA: Diagnosis not present

## 2019-05-02 DIAGNOSIS — D4959 Neoplasm of unspecified behavior of other genitourinary organ: Secondary | ICD-10-CM

## 2019-05-02 DIAGNOSIS — G893 Neoplasm related pain (acute) (chronic): Secondary | ICD-10-CM | POA: Diagnosis not present

## 2019-05-02 DIAGNOSIS — F17210 Nicotine dependence, cigarettes, uncomplicated: Secondary | ICD-10-CM | POA: Diagnosis not present

## 2019-05-02 NOTE — Progress Notes (Signed)
Referrals sent to medical and radiation oncology. PET ordered. Dr. Baruch Gouty meeting her today at initial gyn onc visit with Dr. Fransisca Connors

## 2019-05-02 NOTE — Progress Notes (Signed)
Gynecologic Oncology Consult Visit   Referring Provider: Dr. Georgianne Fick  Chief Complaint: Malignant Neoplasm of Endocervix  Subjective:  Mariah Mcmahon is a 53 y.o. G22P0011 female who is seen in consultation from Dr. Georgianne Fick for malignant neoplasm of endocervix.   Initially presented to PCP for abnormal uterine bleeding, pelvic and back pain.  Lumbar spine x-ray was normal.  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  She reported heavy and painful irregular bleeding.  Pain is present outside of menses.  She is a smoker with 15-pack-year history.  Denies history of abnormal Paps but prior results not available for review.  She presented to Dr. Georgianne Fick on 04/24/2019.  On exam, barrel-shaped with mass-effect of left portion of cervix, cervix grossly enlarged, friable with contact bleeding colposcopy was performed.  Urine pregnancy test was negative.    Diagnosis: 1.  Cervix, biopsy, 12:00 -High-grade squamous intraepithelial lesion, CIN-3 2.  Cervix, biopsy, 3:00 -High-grade squamous intraepithelial lesion, CIN-3 3.  Cervix, biopsy, 5:00 -High-grade squamous intraepithelial lesion, CIN-3 with foci suspicious for early stromal microinvasion 4.  Endocervix, curettage -High-grade squamous intraepithelial lesion, CIN-3  HIV screening-non-reactive (04/09/2019)  05/02/2019- CT C/A/P 1. Heterogeneous enlargement of the uterus and cervix with apparent soft tissue thickening in the upper vagina. Cervical and vaginal tissues not well evaluated by CT. 2. Bulky retroperitoneal lymphadenopathy in the abdomen with bilateral common iliac and pelvic sidewall lymphadenopathy in the pelvis. Imaging features consistent with metastatic disease. 3. 5 cm soft tissue lesion to the left of the bladder is consistent with a metastatic deposit. 4. Mild to moderate right hydroureteronephrosis with decreased perfusion to the right kidney. Right ureteral obstruction is at the level of the right pelvic  sidewall. 5. 2.2 cm mixed lytic and lucent lesion in the left sacrum, indeterminate, but metastatic disease not excluded.  6. Several 3-4 mm nodules identified in the lungs. Close attention on follow-up recommended as metastatic disease not excluded.  7. Small volume ascites.   Problem List: Patient Active Problem List   Diagnosis Date Noted  . Generalized abdominal pain 04/09/2019  . Acute midline low back pain without sciatica 04/09/2019  . Nicotine dependence, cigarettes, uncomplicated 38/88/2800    Past Medical History: Past Medical History:  Diagnosis Date  . No pertinent past medical history     Past Surgical History: Past Surgical History:  Procedure Laterality Date  . MASS EXCISION     Throat  . TONSILLECTOMY      Past Gynecologic History:  History of abnormal Paps: Per HPI Contraception: Sexually active: Not currently Menarche: 8th grade Details: 5 days  History of OCP/HRT use:   OB History:  OB History  Gravida Para Term Preterm AB Living  '2 1     1 1  '$ SAB TAB Ectopic Multiple Live Births               # Outcome Date GA Lbr Len/2nd Weight Sex Delivery Anes PTL Lv  2 AB           1 Para             Family History: Family History  Problem Relation Age of Onset  . Brain cancer Mother 62  . Alcohol abuse Father   . Heart disease Father   . Prostate cancer Father   . Heart attack Father     Social History: Social History   Socioeconomic History  . Marital status: Divorced    Spouse name: Not on file  .  Number of children: Not on file  . Years of education: Not on file  . Highest education level: Not on file  Occupational History  . Occupation: [employer]    Employer: [employer]  . Financial resource strain: Not hard at all  . Food insecurity    Worry: Never true    Inability: Never true  . Transportation needs    Medical: No    Non-medical: No  Tobacco Use  . Smoking status: Current Every Day Smoker     Packs/day: 0.50    Years: 30.00    Pack years: 15.00    Types: Cigarettes  . Smokeless tobacco: Never Used  Substance and Sexual Activity  . Alcohol use: Not Currently  . Drug use: Never  . Sexual activity: Yes    Comment: not at moment  Lifestyle  . Physical activity    Days per week: 6 days    Minutes per session: 40 min  . Stress: Not at all  Relationships  . Social connections    Talks on phone: More than three times a week    Gets together: More than three times a week    Attends religious service: Never    Active member of club or organization: No    Attends meetings of clubs or organizations: Never    Relationship status: Divorced  . Intimate partner violence    Fear of current or ex partner: No    Emotionally abused: No    Physically abused: No    Forced sexual activity: No  Other Topics Concern  . Not on file  Social History Narrative  . Not on file    Allergies: No Known Allergies  Current Medications: Current Outpatient Medications  Medication Sig Dispense Refill  . cyclobenzaprine (FLEXERIL) 10 MG tablet Take 1 tablet (10 mg total) by mouth 3 (three) times daily as needed for muscle spasms. 30 tablet 1  . meloxicam (MOBIC) 7.5 MG tablet Take 1 tablet (7.5 mg total) by mouth 2 (two) times a day. 60 tablet 0  . methocarbamol (ROBAXIN) 500 MG tablet Take 1 tablet (500 mg total) by mouth 2 (two) times daily as needed for muscle spasms (for lower back pain). 30 tablet 0   No current facility-administered medications for this visit.     Review of Systems General: negative for fevers, chills, fatigue, changes in sleep, changes in weight or appetite Skin: negative for changes in color, texture, moles or lesions Eyes: negative for changes in vision, pain, diplopia HEENT: negative for change in hearing, pain, discharge, tinnitus, vertigo, voice changes, sore throat, neck masses Pulmonary: negative for dyspnea, orthopnea, productive cough Cardiac: negative for  palpitations, syncope, pain, discomfort, pressure Gastrointestinal: negative for dysphagia, nausea, vomiting, jaundice, pain, constipation, diarrhea, hematemesis, hematochezia Genitourinary/Sexual: negative for dysuria, discharge, hesitancy, nocturia, retention, stones, infections, STD's, incontinence Ob/Gyn: positive for bleeding Musculoskeletal: negative for pain, stiffness, swelling, range of motion limitation Hematology: negative for easy bruising, bleeding Neurologic/Psych: negative for headaches, seizures, paralysis, weakness, tremor, change in gait, change in sensation, mood swings, depression, anxiety, change in memory   Objective:  Physical Examination:  Today's Vitals   05/02/19 1332  Resp: 20  Temp: 99 F (37.2 C)  TempSrc: Tympanic  Weight: 119 lb (54 kg)  Height: '5\' 2"'$  (1.575 m)  PainSc: 5   PainLoc: Back   Body mass index is 21.77 kg/m.  LMP 04/23/2019     ECOG Performance Status: 1 - Symptomatic but completely ambulatory  GENERAL: Patient is a well appearing female in no acute distress HEENT:  Sclerae anicteric.  Oropharynx clear and moist. Neck is supple.  NODES:  No cervical, supraclavicular, or axillary lymphadenopathy palpated.  LUNGS:  Clear to auscultation bilaterally.  No wheezes or rhonchi. HEART:  Regular rate and rhythm. No murmur appreciated. ABDOMEN:  Soft, nontender.  Positive, normoactive bowel sounds. No organomegaly palpated. MSK:  No focal spinal tenderness to palpation.  EXTREMITIES:  No peripheral edema.   SKIN:  Clear with no obvious rashes or skin changes.  NEURO:  Nonfocal. Well oriented.  Appropriate affect.  Pelvic: Exam Chaperoned by NP EGBUS: no lesions Cervix: Large barrel cervix that is irregular. No exophytic tumor, it is all growing into the cervix an expanding it.  Vagina: Blood in vault. No lesions, no discharge Uterus: 14 week size and prominent anteriorly.   Bimanual/RV: no masses, parametria smooth Rectovaginal:  confirmatory  Lab Review No labs on site today  Radiologic Imaging: Imaging independently reviewed by Dr. Fransisca Connors.  He agrees with findings.  Results discussed in detail with patient. -CT chest/abdomen/pelvis 05/01/2019 -DG lumbar spine 04/09/2019    Assessment:  Mariah Mcmahon is a 53 y.o. 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  There is 2.2 cm mixed lytic and lucent lesion in the left sacrum, indeterminate, but metastatic disease not excluded.  Several 3-4 mm nodules identified in the lungs.  Also has area in the sacrum concerning for metastatic disease. Right hydronephrosis noted with some decreased function. Cervical biopsy only suggestive of microinvasion, but biopsy surely just was not deep enough as the tumor is all submucosal and up in the cervix. Do not think another cervical biopsy is needed to establish the diagnosis.  CT scan clearly shows advanced cervical cancer.   Medical co-morbidities complicating care: Marland Kitchen  Plan:   Problem List Items Addressed This Visit    None    Visit Diagnoses    Malignant neoplasm of cervix, unspecified site [facility])    -  Primary     Discussed the clinical features of cervical cancer with extensive adenopathy and possible sacral metastasis.  Discussed with Dr Baruch Gouty from [place] and we will order PET scan to better evaluate metastatic disease.  Based on CT scan she could be amenable to treatment with chemoradiation including external radiation and brachytherapy with curative intent.  However, if metastatic disease goes higher than renals or if other disease found beyond a traditional radiation field, it may be best to start treatment with systemic therapy, carboplatin/taxol +/- bevacizumab. Then can decide on how radiation can be used to add to her treatment after a few cycles.  Unless she is having dramatic response, additional biopsy  will be needed to assess for TMB, MSI, PDL1 to determine if pembrolizumab would be an option.   She is taking meloxicam for pain and declines oxycodone presently.   Will order PET scan and then she will see Dr Baruch Gouty and Med Onc for treatment planning.  Also will refer to Dr Erlene Quan in Urology for right ureteral stent placement.   Discussed the extensive nature of her cervical cancer and its potentially life threatening nature.  Told her that the prognosis would become clearer in the next few months.    The patient's diagnosis, an outline of the further diagnostic and laboratory studies which will be required, the recommendation for surgery, and alternatives were discussed  with her and her accompanying family members.  All questions were answered to their satisfaction.  A total of 60 minutes were spent with the patient/family today; 40 % was spent in education, counseling and coordination of care for cervical cancer.    Mellody Drown, MD  CC:  Malachy Mood, MD [address] [place],  [place] [address] [phone]

## 2019-05-02 NOTE — Patient Instructions (Signed)
Steps to Quit Smoking    Smoking tobacco can be bad for your health. It can also affect almost every organ in your body. Smoking puts you and people around you at risk for many serious long-lasting (chronic) diseases. Quitting smoking is hard, but it is one of the best things that you can do for your health. It is never too late to quit.  What are the benefits of quitting smoking?  When you quit smoking, you lower your risk for getting serious diseases and conditions. They can include:  · Lung cancer or lung disease.  · Heart disease.  · Stroke.  · Heart attack.  · Not being able to have children (infertility).  · Weak bones (osteoporosis) and broken bones (fractures).  If you have coughing, wheezing, and shortness of breath, those symptoms may get better when you quit. You may also get sick less often. If you are pregnant, quitting smoking can help to lower your chances of having a baby of low birth weight.  What can I do to help me quit smoking?  Talk with your doctor about what can help you quit smoking. Some things you can do (strategies) include:  · Quitting smoking totally, instead of slowly cutting back how much you smoke over a period of time.  · Going to in-person counseling. You are more likely to quit if you go to many counseling sessions.  · Using resources and support systems, such as:  ? Online chats with a counselor.  ? Phone quitlines.  ? Printed self-help materials.  ? Support groups or group counseling.  ? Text messaging programs.  ? Mobile phone apps or applications.  · Taking medicines. Some of these medicines may have nicotine in them. If you are pregnant or breastfeeding, do not take any medicines to quit smoking unless your doctor says it is okay. Talk with your doctor about counseling or other things that can help you.  Talk with your doctor about using more than one strategy at the same time, such as taking medicines while you are also going to in-person counseling. This can help make  quitting easier.  What things can I do to make it easier to quit?  Quitting smoking might feel very hard at first, but there is a lot that you can do to make it easier. Take these steps:  · Talk to your family and friends. Ask them to support and encourage you.  · Call phone quitlines, reach out to support groups, or work with a counselor.  · Ask people who smoke to not smoke around you.  · Avoid places that make you want (trigger) to smoke, such as:  ? Bars.  ? Parties.  ? Smoke-break areas at work.  · Spend time with people who do not smoke.  · Lower the stress in your life. Stress can make you want to smoke. Try these things to help your stress:  ? Getting regular exercise.  ? Deep-breathing exercises.  ? Yoga.  ? Meditating.  ? Doing a body scan. To do this, close your eyes, focus on one area of your body at a time from head to toe, and notice which parts of your body are tense. Try to relax the muscles in those areas.  · Download or buy apps on your mobile phone or tablet that can help you stick to your quit plan. There are many free apps, such as QuitGuide from the CDC (Centers for Disease Control and Prevention). You can find more   support from smokefree.gov and other websites.  This information is not intended to replace advice given to you by your health care provider. Make sure you discuss any questions you have with your health care provider.  Document Released: 08/21/2009 Document Revised: 06/22/2016 Document Reviewed: 03/11/2015  Elsevier Interactive Patient Education © 2019 Elsevier Inc.

## 2019-05-03 ENCOUNTER — Other Ambulatory Visit: Payer: Self-pay | Admitting: Nurse Practitioner

## 2019-05-03 MED ORDER — CYCLOBENZAPRINE HCL 10 MG PO TABS: 10.0000 mg | ORAL_TABLET | Freq: Three times a day (TID) | ORAL | 1 refills | Status: DC | PRN

## 2019-05-03 MED ORDER — MELOXICAM 7.5 MG PO TABS: 7.5000 mg | ORAL_TABLET | Freq: Two times a day (BID) | ORAL | 1 refills | Status: DC

## 2019-05-03 MED ORDER — METHOCARBAMOL 500 MG PO TABS: 500.0000 mg | ORAL_TABLET | Freq: Two times a day (BID) | ORAL | 1 refills | Status: DC | PRN

## 2019-05-03 NOTE — Consult Note (Signed)
NEW PATIENT EVALUATION  Name: Mariah Mcmahon  MRN: [id number]  Date:   05/02/2019     DOB: Aug 23, 1966   This 53 y.o. female patient presents to the clinic for initial evaluation of obviously advanced cervical squamous cell carcinoma HPV positive as well as HGSIL.  REFERRING PHYSICIAN: Venita Lick, NP  CHIEF COMPLAINT: No chief complaint on file.   DIAGNOSIS: The encounter diagnosis was Malignant neoplasm of cervix, unspecified site [facility]).   PREVIOUS INVESTIGATIONS:  CT scans reviewed PET CT scan ordered Pathology report reviewed Clinical notes reviewed  HPI: Patient is a 53 year old female who presented with abnormal uterine bleeding lower back pain.  She underwent a examination and Pap smear on June June 9 showing HGSIL and HPV positive showing high-grade squamous epithelial lesion with early stromal invasion.  CT scan showed heterogeneous enlargement the uterus and cervix.  There was bulky retroperitoneal lymphadenopathy in the abdomen bilateral common iliac and pelvic sidewall lymphadenopathy in the pelvis consistent with metastatic disease.  There is also a 5 cm soft tissue lesion in the left of the bladder consistent with metastatic deposit.  She also has hydronephrosis on the right kidney with ureteral obstruction at the level of the right pelvic wall.  There was also a 2 cm mixed lucent lytic and lucent lesion left sacrum indeterminate but metastatic disease not excluded.  There are also several subcentimeter nodules identified in the lungs.  On examination by Dr. Georgianne Fick on 616 she had a barrel-shaped uterus with mass-effect on left posterior portion of the cervix.  She is seen today in conjunction with Dr. Fransisca Connors for evaluation.  Dr. Fransisca Connors does not feel the need for another biopsy.  We have ordered a PET CT scan for better delineation of her staging.  She continues to have some vaginal bleeding and lower back pain.  PLANNED TREATMENT REGIMEN: Probable concurrent  chemoradiation to be better delineated after PET CT scan  PAST MEDICAL HISTORY:  has a past medical history of No pertinent past medical history.    PAST SURGICAL HISTORY:  Past Surgical History:  Procedure Laterality Date  . MASS EXCISION     Throat  . TONSILLECTOMY      FAMILY HISTORY: family history includes Alcohol abuse in her father; Brain cancer (age of onset: 7) in her mother; Diabetes in her father; Heart attack in her father; Heart disease in her father; Prostate cancer in her father.  SOCIAL HISTORY:  reports that she has been smoking cigarettes. She has a 15.00 pack-year smoking history. She has never used smokeless tobacco. She reports previous alcohol use. She reports that she does not use drugs.  ALLERGIES: Patient has no known allergies.  MEDICATIONS:  Current Outpatient Medications  Medication Sig Dispense Refill  . cyclobenzaprine (FLEXERIL) 10 MG tablet Take 1 tablet (10 mg total) by mouth 3 (three) times daily as needed for muscle spasms. 90 tablet 1  . meloxicam (MOBIC) 7.5 MG tablet Take 1 tablet (7.5 mg total) by mouth 2 (two) times a day. 120 tablet 1  . methocarbamol (ROBAXIN) 500 MG tablet Take 1 tablet (500 mg total) by mouth 2 (two) times daily as needed for muscle spasms (for lower back pain). 120 tablet 1   No current facility-administered medications for this encounter.     ECOG PERFORMANCE STATUS:  1 - Symptomatic but completely ambulatory  REVIEW OF SYSTEMS: Patient denies any weight loss, fatigue, weakness, fever, chills or night sweats. Patient denies any loss of vision, blurred vision. Patient denies  any ringing  of the ears or hearing loss. No irregular heartbeat. Patient denies heart murmur or history of fainting. Patient denies any chest pain or pain radiating to her upper extremities. Patient denies any shortness of breath, difficulty breathing at night, cough or hemoptysis. Patient denies any swelling in the lower legs. Patient denies any  nausea vomiting, vomiting of blood, or coffee ground material in the vomitus. Patient denies any stomach pain. Patient states has had normal bowel movements no significant constipation or diarrhea. Patient denies any dysuria, hematuria or significant nocturia. Patient denies any problems walking, swelling in the joints or loss of balance. Patient denies any skin changes, loss of hair or loss of weight. Patient denies any excessive worrying or anxiety or significant depression. Patient denies any problems with insomnia. Patient denies excessive thirst, polyuria, polydipsia. Patient denies any swollen glands, patient denies easy bruising or easy bleeding. Patient denies any recent infections, allergies or URI. Patient "s visual fields have not changed significantly in recent time.   PHYSICAL EXAM: LMP 04/23/2019  Well-developed well-nourished patient in NAD. HEENT reveals PERLA, EOMI, discs not visualized.  Oral cavity is clear. No oral mucosal lesions are identified. Neck is clear without evidence of cervical or supraclavicular adenopathy. Lungs are clear to A&P. Cardiac examination is essentially unremarkable with regular rate and rhythm without murmur rub or thrill. Abdomen is benign with no organomegaly or masses noted. Motor sensory and DTR levels are equal and symmetric in the upper and lower extremities. Cranial nerves II through XII are grossly intact. Proprioception is intact. No peripheral adenopathy or edema is identified. No motor or sensory levels are noted. Crude visual fields are within normal range.  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  She also a PET CT scan for definitive staging.  Patient may benefit from upfront chemotherapy based on the extreme  bulky nature of her periaortic lymph node involvement.  If there is no further distant disease would treat with curative intent with concurrent chemoradiation as well as brachytherapy.  Risks and benefits of treatment were reviewed with the patient.  Side effect such as lower blood count skin reaction increased chance of diarrhea and increased lower urinary tract symptoms all were discussed in detail with the patient.  I have set her up for follow-up a day or so after PET scan and also she will meet with medical oncology on that day.  I will brief medical oncology Dr. Grayland Ormond prior to his seeing the patient.  Patient comprehends her initial treatment plan well.  I would like to take this opportunity to thank you for allowing me to participate in the care of your patient.Noreene Filbert, MD

## 2019-05-03 NOTE — Progress Notes (Signed)
Refills on Meloxicam and muscle relaxer's provided.

## 2019-05-04 ENCOUNTER — Encounter: Payer: Self-pay | Admitting: Nurse Practitioner

## 2019-05-04 DIAGNOSIS — C539 Malignant neoplasm of cervix uteri, unspecified: Secondary | ICD-10-CM | POA: Insufficient documentation

## 2019-05-06 NOTE — Progress Notes (Signed)
[place]  [phone] [phone]  ID: Mariah Mcmahon OB: 1966-05-12  MR#: [id number]  IHK#:[id number]  Patient Care Team: Venita Lick, NP as PCP - General (Nurse Practitioner) Clent Jacks, RN as Oncology Nurse Navigator  CHIEF COMPLAINT: Stage IIIb cervical cancer  INTERVAL HISTORY: Patient is a 53 year old female who initially presented with back pain and abnormal uterine bleeding.  Subsequent imaging and biopsy revealed the above-stated cervical cancer.  She otherwise feels well and is asymptomatic.  She has no neurologic complaints.  She denies any recent fevers or illnesses.  She has a good appetite and denies weight loss.  She has no chest pain, shortness of breath, cough, or hemoptysis.  She denies any nausea, vomiting, constipation, or diarrhea.  She has no urinary complaints.  Patient otherwise feels well and offers no further specific complaints today.  REVIEW OF SYSTEMS:   Review of Systems  Constitutional: Negative.  Negative for fever, malaise/fatigue and weight loss.  Respiratory: Negative.  Negative for cough and shortness of breath.   Cardiovascular: Negative.  Negative for chest pain and leg swelling.  Gastrointestinal: Negative.  Negative for abdominal pain.  Genitourinary: Negative.  Negative for hematuria.       Abnormal uterine bleeding.  Musculoskeletal: Negative.  Negative for back pain.  Skin: Negative.  Negative for rash.  Neurological: Negative.  Negative for dizziness, focal weakness, weakness and headaches.  Psychiatric/Behavioral: The patient is nervous/anxious.     As per HPI. Otherwise, a complete review of systems is negative.  PAST MEDICAL HISTORY: Past Medical History:  Diagnosis Date   No pertinent past medical history     PAST SURGICAL HISTORY: Past Surgical History:  Procedure Laterality Date   MASS EXCISION     Throat   TONSILLECTOMY      FAMILY HISTORY: Family History  Problem  Relation Age of Onset   Brain cancer Mother 65   Alcohol abuse Father    Heart disease Father    Prostate cancer Father    Heart attack Father    Diabetes Father     ADVANCED DIRECTIVES (Y/N):  N  HEALTH MAINTENANCE: Social History   Tobacco Use   Smoking status: Current Every Day Smoker    Packs/day: 0.50    Years: 30.00    Pack years: 15.00    Types: Cigarettes   Smokeless tobacco: Never Used  Substance Use Topics   Alcohol use: Not Currently   Drug use: Never     Colonoscopy:  PAP:  Bone density:  Lipid panel:  No Known Allergies  Current Outpatient Medications  Medication Sig Dispense Refill   meloxicam (MOBIC) 7.5 MG tablet Take 1 tablet (7.5 mg total) by mouth 2 (two) times a day. 120 tablet 1   methocarbamol (ROBAXIN) 500 MG tablet Take 1 tablet (500 mg total) by mouth 2 (two) times daily as needed for muscle spasms (for lower back pain). 120 tablet 1   cyclobenzaprine (FLEXERIL) 10 MG tablet Take 1 tablet (10 mg total) by mouth 3 (three) times daily as needed for muscle spasms. (Patient not taking: Reported on 05/09/2019) 90 tablet 1   No current facility-administered medications for this visit.     OBJECTIVE: Vitals:   05/09/19 1138  BP: (!) 143/90  Pulse: 78  Temp: 97.8 F (36.6 C)     Body mass index is 21.38 kg/m.    ECOG FS:0 - Asymptomatic  General: Well-developed, well-nourished, no acute distress. Eyes: Pink conjunctiva, anicteric sclera. HEENT:  Normocephalic, moist mucous membranes, clear oropharnyx. Lungs: Clear to auscultation bilaterally. Heart: Regular rate and rhythm. No rubs, murmurs, or gallops. Abdomen: Soft, nontender, nondistended. No organomegaly noted, normoactive bowel sounds. Musculoskeletal: No edema, cyanosis, or clubbing. Neuro: Alert, answering all questions appropriately. Cranial nerves grossly intact. Skin: No rashes or petechiae noted. Psych: Normal affect. Lymphatics: No cervical, calvicular, axillary or  inguinal LAD.   LAB RESULTS:  Lab Results  Component Value Date   NA 138 04/09/2019   K 4.5 04/09/2019   CL 99 04/09/2019   CO2 23 04/09/2019   GLUCOSE 87 04/09/2019   BUN 12 04/09/2019   CREATININE 0.65 04/09/2019   CALCIUM 9.4 04/09/2019   PROT 6.8 04/09/2019   ALBUMIN 4.0 04/09/2019   AST 24 04/09/2019   ALT 6 04/09/2019   ALKPHOS 79 04/09/2019   BILITOT 0.4 04/09/2019   GFRNONAA 104 04/09/2019   GFRAA 120 04/09/2019    Lab Results  Component Value Date   WBC 8.2 04/09/2019   NEUTROABS 5.9 04/09/2019   HGB 14.5 04/09/2019   HCT 44.2 04/09/2019   MCV 99 (H) 04/09/2019   PLT 401 04/09/2019     STUDIES: Ct Chest W Contrast  Result Date: 05/01/2019 CLINICAL DATA:  Abnormal Pap smear.  Cervical neoplasm.  Staging. EXAM: CT CHEST, ABDOMEN, AND PELVIS WITH CONTRAST TECHNIQUE: Multidetector CT imaging of the chest, abdomen and pelvis was performed following the standard protocol during bolus administration of intravenous contrast. CONTRAST:  55mL OMNIPAQUE IOHEXOL 300 MG/ML  SOLN COMPARISON:  None. FINDINGS: CT CHEST FINDINGS Cardiovascular: The heart size is normal. No substantial pericardial effusion. No thoracic aortic aneurysm. Mediastinum/Nodes: No mediastinal lymphadenopathy. There is no hilar lymphadenopathy. Small lymph nodes are seen in the axillary regions bilaterally. The esophagus has normal imaging features. Lungs/Pleura: Scattered tiny centrilobular ground-glass nodules are seen in the lungs bilaterally with an upper lung predominance. Imaging features compatible with smoking related lung disease (respiratory bronchiolitis-associated intersitial lung disease). 3 mm right upper lobe solid nodule is visible on image 54/series 4. 3 mm right middle lobe nodule is visible on 76/4. 4 mm left lower lobe nodule identified on 101/4. No focal airspace consolidation no pleural effusion. Musculoskeletal: No worrisome lytic or sclerotic osseous abnormality. CT ABDOMEN PELVIS  FINDINGS Hepatobiliary: No suspicious focal abnormality within the liver parenchyma. There is no evidence for gallstones, gallbladder wall thickening, or pericholecystic fluid. No intrahepatic or extrahepatic biliary dilation. Pancreas: No focal mass lesion. No dilatation of the main duct. No intraparenchymal cyst. No peripancreatic edema. Spleen: No splenomegaly. No focal mass lesion. Adrenals/Urinary Tract: No adrenal nodule or mass. Mild to moderate right hydroureteronephrosis identified with right ureteral dilatation extending down to the right pelvic sidewall. Decreased perfusion to the right kidney is compatible with obstructive uropathy. Left kidney and ureter unremarkable. Bladder is nondistended. Stomach/Bowel: Stomach is unremarkable. No gastric wall thickening. No evidence of outlet obstruction. Duodenum is normally positioned as is the ligament of Treitz. No small bowel wall thickening. No small bowel dilatation. No gross colonic mass. No colonic wall thickening. Vascular/Lymphatic: There is abdominal aortic atherosclerosis without aneurysm. Portal vein and superior mesenteric vein are patent. Bulky retroperitoneal lymphadenopathy is identified in the abdomen. Nodal conglomeration circumferentially encases the aorta just inferior to the renal arteries. This nodal mass measures 3.4 x 5.3 cm (image 58/series 2). Just proximal to the aortic bifurcation, nodal conglomeration encasing the distal aorta measures 3.5 x 3.2 cm. 1.3 cm short axis right common iliac node is visible on 77/2. 1.0 cm short  axis lymph node is seen along the left common iliac chain (81/2). Lymphadenopathy is noted in the pelvic sidewalls bilaterally, right greater than left. Index 1.8 cm short axis right pelvic sidewall node is visible on 91/2. Another right pelvic sidewall lymph node measures 2.4 cm short axis on 95/2. Reproductive: Uterus appears enlarged and heterogeneous, measuring 14.2 x 7.3 x 7.0 cm. Cervix is prominent  heterogeneous with apparent wall thickening in the upper vagina. Other: Small volume free fluid noted adjacent to the liver and in the cul-de-sac. 3.3 x 4.9 cm irregular soft tissue lesion is identified to the left of the bladder on 102/2. Nodularity in the cul-de-sac is suspicious for peritoneal disease. Musculoskeletal: 2.2 cm mixed lytic and lucent lesion is identified in the left sacrum with tiny sclerotic foci evident in the left iliac bone. IMPRESSION: 1. Heterogeneous enlargement of the uterus and cervix with apparent soft tissue thickening in the upper vagina. Cervical and vaginal tissues not well evaluated by CT. 2. Bulky retroperitoneal lymphadenopathy in the abdomen with bilateral common iliac and pelvic sidewall lymphadenopathy in the pelvis. Imaging features consistent with metastatic disease. 3. 5 cm soft tissue lesion to the left of the bladder is consistent with a metastatic deposit. 4. Mild to moderate right hydroureteronephrosis with decreased perfusion to the right kidney. Right ureteral obstruction is at the level of the right pelvic sidewall. 5. 2.2 cm mixed lytic and lucent lesion in the left sacrum, indeterminate, but metastatic disease not excluded. 6. Several 3-4 mm nodules identified in the lungs. Close attention on follow-up recommended as metastatic disease not excluded. 7. Small volume ascites. Electronically Signed   By: Misty Stanley M.D.   On: 05/01/2019 16:38   Ct Abdomen Pelvis W Contrast  Result Date: 05/01/2019 CLINICAL DATA:  Abnormal Pap smear.  Cervical neoplasm.  Staging. EXAM: CT CHEST, ABDOMEN, AND PELVIS WITH CONTRAST TECHNIQUE: Multidetector CT imaging of the chest, abdomen and pelvis was performed following the standard protocol during bolus administration of intravenous contrast. CONTRAST:  8mL OMNIPAQUE IOHEXOL 300 MG/ML  SOLN COMPARISON:  None. FINDINGS: CT CHEST FINDINGS Cardiovascular: The heart size is normal. No substantial pericardial effusion. No thoracic  aortic aneurysm. Mediastinum/Nodes: No mediastinal lymphadenopathy. There is no hilar lymphadenopathy. Small lymph nodes are seen in the axillary regions bilaterally. The esophagus has normal imaging features. Lungs/Pleura: Scattered tiny centrilobular ground-glass nodules are seen in the lungs bilaterally with an upper lung predominance. Imaging features compatible with smoking related lung disease (respiratory bronchiolitis-associated intersitial lung disease). 3 mm right upper lobe solid nodule is visible on image 54/series 4. 3 mm right middle lobe nodule is visible on 76/4. 4 mm left lower lobe nodule identified on 101/4. No focal airspace consolidation no pleural effusion. Musculoskeletal: No worrisome lytic or sclerotic osseous abnormality. CT ABDOMEN PELVIS FINDINGS Hepatobiliary: No suspicious focal abnormality within the liver parenchyma. There is no evidence for gallstones, gallbladder wall thickening, or pericholecystic fluid. No intrahepatic or extrahepatic biliary dilation. Pancreas: No focal mass lesion. No dilatation of the main duct. No intraparenchymal cyst. No peripancreatic edema. Spleen: No splenomegaly. No focal mass lesion. Adrenals/Urinary Tract: No adrenal nodule or mass. Mild to moderate right hydroureteronephrosis identified with right ureteral dilatation extending down to the right pelvic sidewall. Decreased perfusion to the right kidney is compatible with obstructive uropathy. Left kidney and ureter unremarkable. Bladder is nondistended. Stomach/Bowel: Stomach is unremarkable. No gastric wall thickening. No evidence of outlet obstruction. Duodenum is normally positioned as is the ligament of Treitz. No small bowel wall  thickening. No small bowel dilatation. No gross colonic mass. No colonic wall thickening. Vascular/Lymphatic: There is abdominal aortic atherosclerosis without aneurysm. Portal vein and superior mesenteric vein are patent. Bulky retroperitoneal lymphadenopathy is  identified in the abdomen. Nodal conglomeration circumferentially encases the aorta just inferior to the renal arteries. This nodal mass measures 3.4 x 5.3 cm (image 58/series 2). Just proximal to the aortic bifurcation, nodal conglomeration encasing the distal aorta measures 3.5 x 3.2 cm. 1.3 cm short axis right common iliac node is visible on 77/2. 1.0 cm short axis lymph node is seen along the left common iliac chain (81/2). Lymphadenopathy is noted in the pelvic sidewalls bilaterally, right greater than left. Index 1.8 cm short axis right pelvic sidewall node is visible on 91/2. Another right pelvic sidewall lymph node measures 2.4 cm short axis on 95/2. Reproductive: Uterus appears enlarged and heterogeneous, measuring 14.2 x 7.3 x 7.0 cm. Cervix is prominent heterogeneous with apparent wall thickening in the upper vagina. Other: Small volume free fluid noted adjacent to the liver and in the cul-de-sac. 3.3 x 4.9 cm irregular soft tissue lesion is identified to the left of the bladder on 102/2. Nodularity in the cul-de-sac is suspicious for peritoneal disease. Musculoskeletal: 2.2 cm mixed lytic and lucent lesion is identified in the left sacrum with tiny sclerotic foci evident in the left iliac bone. IMPRESSION: 1. Heterogeneous enlargement of the uterus and cervix with apparent soft tissue thickening in the upper vagina. Cervical and vaginal tissues not well evaluated by CT. 2. Bulky retroperitoneal lymphadenopathy in the abdomen with bilateral common iliac and pelvic sidewall lymphadenopathy in the pelvis. Imaging features consistent with metastatic disease. 3. 5 cm soft tissue lesion to the left of the bladder is consistent with a metastatic deposit. 4. Mild to moderate right hydroureteronephrosis with decreased perfusion to the right kidney. Right ureteral obstruction is at the level of the right pelvic sidewall. 5. 2.2 cm mixed lytic and lucent lesion in the left sacrum, indeterminate, but metastatic  disease not excluded. 6. Several 3-4 mm nodules identified in the lungs. Close attention on follow-up recommended as metastatic disease not excluded. 7. Small volume ascites. Electronically Signed   By: Misty Stanley M.D.   On: 05/01/2019 16:38   Nm Pet Image Initial (pi) Skull Base To Thigh  Result Date: 05/07/2019 CLINICAL DATA:  Initial treatment strategy for cervical cancer. EXAM: NUCLEAR MEDICINE PET SKULL BASE TO THIGH TECHNIQUE: 6.5 mCi F-18 FDG was injected intravenously. Full-ring PET imaging was performed from the skull base to thigh after the radiotracer. CT data was obtained and used for attenuation correction and anatomic localization. Fasting blood glucose: 94 mg/dl COMPARISON:  05/01/2019 CT chest, abdomen and pelvis. FINDINGS: Mediastinal blood pool activity: SUV max 2.4 Liver activity: SUV max NA NECK: No hypermetabolic lymph nodes in the neck. Incidental CT findings: none CHEST: No hypermetabolic axillary, mediastinal or hilar lymph nodes. No hypermetabolic pulmonary findings. Incidental CT findings: No acute consolidative airspace disease or lung masses. A few scattered small solid pulmonary nodules in both lungs, largest 4 mm in the medial basilar left lower lobe (series 3/image 119), below PET resolution. ABDOMEN/PELVIS: Hypermetabolic bilateral retrocrural lymphadenopathy. Representative 1.0 cm left retrocrural node with max SUV 7.0 (series 3/image 127). Multiple enlarged hypermetabolic left para-aortic and aortocaval nodes. Representative 2.9 cm left para-aortic node with max SUV 12.8 (series 3/image 152). Representative 1.9 cm aortocaval node with max SUV 9.7 (series 3/image 158). Hypermetabolic bilateral common iliac, right external iliac and right internal iliac lymphadenopathy.  Representative 2.1 cm left common iliac node with max SUV 12.3 (series 3/image 186). Representative 1.8 cm right external iliac node with max SUV 16.5 (series 3/image 212). Representative 1.3 cm right internal  iliac node with max SUV 13.1 (series 3/image 202). Hypermetabolic mass in the cervix with max SUV 19.1, poorly delineated on the noncontrast CT images. Hypermetabolism throughout the enlarged lower uterine segment and body of the uterus compatible with malignant involvement of the uterus with max SUV 14.7. Small foci of hypermetabolism at the periphery of the tampon within the anterior vagina without mass correlate on the CT images, favor urinary contamination. Hypermetabolic 6.1 x 3.4 cm soft tissue implant in the anterior left pelvis (series 3/image 216) surrounded by small volume pelvic ascites with max SUV 6.5 (series 3/image 216). No abnormal hypermetabolic activity within the liver, pancreas, adrenal glands, or spleen. Incidental CT findings: Mildly atherosclerotic nonaneurysmal abdominal aorta. Mild right hydroureteronephrosis to the level of the upper pelvic right ureter. SKELETON: No focal hypermetabolic activity to suggest skeletal metastasis. No hypermetabolism associated with a mixed lytic and sclerotic upper left sacral lesion on series 3/image 192. Incidental CT findings: none IMPRESSION: 1. Hypermetabolic mass in the cervix compatible with known primary cervical malignancy. Hypermetabolism throughout the enlarged lower uterine segment and body of the uterus compatible with malignant involvement of the uterus. 2. Hypermetabolic right internal and external iliac, bilateral common iliac, aortocaval, left para-aortic and bilateral retrocrural lymphadenopathy compatible with metastatic nodal disease. 3. Hypermetabolic soft tissue implant in the anterior left pelvis surrounded by small volume pelvic ascites, favor pelvic peritoneal metastasis. 4. No hypermetabolic osseous metastatic disease. No hypermetabolism associated with the mixed lytic and sclerotic upper left sacral lesion, which is probably benign. 5. Tiny bilateral scattered solid pulmonary nodules, below PET resolution, indeterminate, recommend  attention on close chest CT follow-up in 3 months. 6.  Aortic Atherosclerosis (ICD10-I70.0). Electronically Signed   By: Ilona Sorrel M.D.   On: 05/07/2019 16:02    ASSESSMENT: Stage IIIb cervical cancer.  PLAN:    1.  Stage IIIb cervical cancer: Biopsy results from April 24, 2019 confirmed the diagnosis.  PET scan results from May 07, 2019 reviewed independently and reported as above with extensive nodal disease as well as mass-effect causing possible hydronephrosis.  Case discussed with gynecology oncology as well as radiation oncology.  Patient is not a surgical candidate as of right now, but will likely benefit from concurrent radiation along with weekly cisplatin 40 mg/m2.  This will be followed by [facility] at [facility].  If patient still has residual disease after her brachii therapy, she will benefit from systemic treatment with carboplatinum/Taxol or cisplatin/Taxol plus or minus bevacizumab.  Return to clinic on Thursday, May 16, 2021 initiate cycle 1 of weekly cisplatin. 2.  Hydronephrosis: Seen on CT scan.  Patient's creatinine is within normal limits.  Monitor, particularly while getting cisplatin. 3.  Venous access: Port placement was discussed, but patient would like to defer at this time.  I spent a total of 60 minutes face-to-face with the patient of which greater than 50% of the visit was spent in counseling and coordination of care as detailed above.  Patient expressed understanding and was in agreement with this plan. She also understands that She can call clinic at any time with any questions, concerns, or complaints.   Cancer Staging Cervical cancer ([place]) Staging form: Cervix Uteri, AJCC 8th Edition - Clinical: FIGO Stage IIIB (cT3b, cN1, cM0) - Signed by Lloyd Huger, MD on  05/09/2019   Lloyd Huger, MD   05/09/2019 6:32 PM

## 2019-05-07 ENCOUNTER — Encounter
Admission: RE | Admit: 2019-05-07 | Discharge: 2019-05-07 | Disposition: A | Discharge status: Home / Self Care | Payer: BC Managed Care – PPO | Source: Ambulatory Visit | Attending: Obstetrics and Gynecology | Admitting: Obstetrics and Gynecology

## 2019-05-07 ENCOUNTER — Other Ambulatory Visit: Payer: Self-pay

## 2019-05-07 DIAGNOSIS — D4959 Neoplasm of unspecified behavior of other genitourinary organ: Secondary | ICD-10-CM | POA: Insufficient documentation

## 2019-05-07 DIAGNOSIS — C539 Malignant neoplasm of cervix uteri, unspecified: Secondary | ICD-10-CM | POA: Diagnosis not present

## 2019-05-07 LAB — GLUCOSE, CAPILLARY: Glucose-Capillary: 94 mg/dL (ref 70–99)

## 2019-05-07 MED ORDER — FLUDEOXYGLUCOSE F - 18 (FDG) INJECTION
6.2000 | Freq: Once | INTRAVENOUS | Status: AC | PRN
  Administered 2019-05-07: 6.5 via INTRAVENOUS

## 2019-05-08 ENCOUNTER — Other Ambulatory Visit: Payer: BC Managed Care – PPO

## 2019-05-08 ENCOUNTER — Ambulatory Visit: Payer: BC Managed Care – PPO | Admitting: Obstetrics and Gynecology

## 2019-05-09 ENCOUNTER — Inpatient Hospital Stay: Payer: BC Managed Care – PPO | Attending: Oncology | Admitting: Oncology

## 2019-05-09 ENCOUNTER — Ambulatory Visit
Admission: RE | Admit: 2019-05-09 | Discharge: 2019-05-09 | Disposition: A | Discharge status: Home / Self Care | Payer: BC Managed Care – PPO | Source: Ambulatory Visit | Attending: Radiation Oncology | Admitting: Radiation Oncology

## 2019-05-09 ENCOUNTER — Encounter: Payer: Self-pay | Admitting: Radiation Oncology

## 2019-05-09 ENCOUNTER — Other Ambulatory Visit: Payer: Self-pay

## 2019-05-09 ENCOUNTER — Encounter: Payer: Self-pay | Admitting: Oncology

## 2019-05-09 VITALS — BP 162/96 | HR 113 | Temp 98.9°F | Resp 18 | Wt 117.1 lb

## 2019-05-09 VITALS — BP 143/90 | HR 78 | Temp 97.8°F | Ht 62.0 in | Wt 116.9 lb

## 2019-05-09 DIAGNOSIS — Z5111 Encounter for antineoplastic chemotherapy: Secondary | ICD-10-CM | POA: Insufficient documentation

## 2019-05-09 DIAGNOSIS — Z79899 Other long term (current) drug therapy: Secondary | ICD-10-CM | POA: Insufficient documentation

## 2019-05-09 DIAGNOSIS — C539 Malignant neoplasm of cervix uteri, unspecified: Secondary | ICD-10-CM | POA: Insufficient documentation

## 2019-05-09 DIAGNOSIS — R2242 Localized swelling, mass and lump, left lower limb: Secondary | ICD-10-CM | POA: Insufficient documentation

## 2019-05-09 DIAGNOSIS — Z923 Personal history of irradiation: Secondary | ICD-10-CM | POA: Diagnosis not present

## 2019-05-09 DIAGNOSIS — G893 Neoplasm related pain (acute) (chronic): Secondary | ICD-10-CM | POA: Insufficient documentation

## 2019-05-09 DIAGNOSIS — C530 Malignant neoplasm of endocervix: Secondary | ICD-10-CM | POA: Insufficient documentation

## 2019-05-09 DIAGNOSIS — C786 Secondary malignant neoplasm of retroperitoneum and peritoneum: Secondary | ICD-10-CM | POA: Diagnosis not present

## 2019-05-09 DIAGNOSIS — D649 Anemia, unspecified: Secondary | ICD-10-CM | POA: Diagnosis not present

## 2019-05-09 DIAGNOSIS — F17210 Nicotine dependence, cigarettes, uncomplicated: Secondary | ICD-10-CM | POA: Diagnosis not present

## 2019-05-09 DIAGNOSIS — R591 Generalized enlarged lymph nodes: Secondary | ICD-10-CM | POA: Diagnosis not present

## 2019-05-09 DIAGNOSIS — R918 Other nonspecific abnormal finding of lung field: Secondary | ICD-10-CM | POA: Diagnosis not present

## 2019-05-09 DIAGNOSIS — R87820 Cervical low risk human papillomavirus (HPV) DNA test positive: Secondary | ICD-10-CM | POA: Diagnosis not present

## 2019-05-09 DIAGNOSIS — R0602 Shortness of breath: Secondary | ICD-10-CM | POA: Insufficient documentation

## 2019-05-09 DIAGNOSIS — N131 Hydronephrosis with ureteral stricture, not elsewhere classified: Secondary | ICD-10-CM | POA: Diagnosis not present

## 2019-05-09 MED ORDER — ONDANSETRON HCL 8 MG PO TABS: 8.0000 mg | ORAL_TABLET | Freq: Two times a day (BID) | ORAL | 2 refills | Status: DC | PRN

## 2019-05-09 MED ORDER — PROCHLORPERAZINE MALEATE 10 MG PO TABS: 10.0000 mg | ORAL_TABLET | Freq: Four times a day (QID) | ORAL | 2 refills | Status: DC | PRN

## 2019-05-09 NOTE — Progress Notes (Signed)
Radiation Oncology Follow up Note  Name: Mariah Mcmahon   Date:   05/09/2019 MRN:  [id number] DOB: 03/20/1966    This 53 y.o. 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  She had a PET CT scan.  Again confirming hypermetabolic activity in the cervix compatible with known malignancy.  Hypermetabolic activity was noted throughout the enlarged lower uterine segment body of the uterus compatible with malignant involvement of the uterus.  There was also internal/external iliac bilateral common iliac aortocaval left periaortic and bilateral retrocrural lymphadenopathy compatible with metastatic disease there is also hypermetabolic soft tissue implant in the anterior left pelvis favoring pelvic peritoneal metastasis.  There was no hypermetabolic osseous disease and a lesion of appearing sclerotic and lytic in the left sacral region is probably benign.  She does have tiny bilateral scattered sub-solid pulmonary nodules below PET resolution which we will follow.  She continues to bleed.  She is seen today for recommendations and will be also seeing medical oncology today.  COMPLICATIONS OF TREATMENT: none  FOLLOW UP COMPLIANCE: keeps appointments   PHYSICAL EXAM:  BP (!) 162/96   Pulse (!) 113   Temp 98.9 F (37.2 C) (Tympanic)   Resp 18   Wt 117 lb 1 oz (53.1 kg)   LMP 04/23/2019 Comment: Chart says 04/23/2019. Pt said she started bleeding in May and hasn't stopped yet.  BMI 21.41 kg/m  Well-developed well-nourished patient in NAD. HEENT reveals PERLA, EOMI, discs not visualized.  Oral cavity is clear. No oral mucosal lesions are identified. Neck is clear without evidence of cervical or supraclavicular adenopathy. Lungs are clear to A&P.  Cardiac examination is essentially unremarkable with regular rate and rhythm without murmur rub or thrill. Abdomen is benign with no organomegaly or masses noted. Motor sensory and DTR levels are equal and symmetric in the upper and lower extremities. Cranial nerves II through XII are grossly intact. Proprioception is intact. No peripheral adenopathy or edema is identified. No motor or sensory levels are noted. Crude visual fields are within normal range.  RADIOLOGY RESULTS: PET CT scan reviewed compatible with above-stated findings  PLAN: At this time I like to go ahead with concurrent chemoradiation.  Based on the periaortic metastatic disease with split my treatments into extended whole pelvic field followed by periaortic radiation.  Do not believe the patient can tolerate the extremely large field needed to incorporate all tumor in a single course of treatment.  I have also discussed with medical oncology concurrent chemotherapy which will be arranged.  I would plan on delivering 4500 cGy to her whole pelvis using PET CT fusion study for treatment field delineation.  Risks and benefits of treatment occluding increased lower urinary tract symptoms diarrhea fatigue alteration of blood counts skin reaction all were discussed with the patient.  Patient will also be seeing urology for consideration of stent placement.  Patient will be seeing Dr. Grayland Ormond this morning.  We will coordinate her care.  There will be extra effort by both professional staff as well as technical staff to coordinate and manage concurrent chemoradiation and ensuing side effects during  treatments.  Patient comprehends her treatment plan well.  I would like to take this opportunity to thank you for allowing me to participate in  the care of your patient.Noreene Filbert, MD

## 2019-05-09 NOTE — Progress Notes (Signed)
START OFF PATHWAY REGIMEN - Uterine   [id number]:Cisplatin 40 mg/m2 IV D1 q7 Days + RT:   A cycle is every 7 days:     Cisplatin   **Always confirm dose/schedule in your pharmacy ordering system**  Patient Characteristics: Endometrioid Histology, Newly Diagnosed, Medically Inoperable, Clinical Stage III Histology: Endometrioid Histology Therapeutic Status: Newly Diagnosed AJCC T Category: T3a AJCC N Category: N2a AJCC M Category: M0 AJCC 8 Stage Grouping: IIIC2 Surgical Status: Medically Inoperable Intent of Therapy: Non-Curative / Palliative Intent, Discussed with Patient

## 2019-05-09 NOTE — Progress Notes (Signed)
Patient is here today to establish care for her malignant neoplasm of cervix.

## 2019-05-10 ENCOUNTER — Ambulatory Visit
Admission: RE | Admit: 2019-05-10 | Discharge: 2019-05-10 | Disposition: A | Discharge status: Home / Self Care | Payer: BC Managed Care – PPO | Source: Ambulatory Visit | Attending: Radiation Oncology | Admitting: Radiation Oncology

## 2019-05-10 ENCOUNTER — Other Ambulatory Visit: Payer: Self-pay

## 2019-05-10 DIAGNOSIS — C772 Secondary and unspecified malignant neoplasm of intra-abdominal lymph nodes: Secondary | ICD-10-CM | POA: Insufficient documentation

## 2019-05-10 DIAGNOSIS — N289 Disorder of kidney and ureter, unspecified: Secondary | ICD-10-CM | POA: Insufficient documentation

## 2019-05-10 DIAGNOSIS — Z510 Encounter for antineoplastic radiation therapy: Secondary | ICD-10-CM | POA: Insufficient documentation

## 2019-05-10 DIAGNOSIS — D649 Anemia, unspecified: Secondary | ICD-10-CM | POA: Diagnosis not present

## 2019-05-10 DIAGNOSIS — Z5111 Encounter for antineoplastic chemotherapy: Secondary | ICD-10-CM | POA: Insufficient documentation

## 2019-05-10 DIAGNOSIS — C539 Malignant neoplasm of cervix uteri, unspecified: Secondary | ICD-10-CM | POA: Diagnosis not present

## 2019-05-10 DIAGNOSIS — C530 Malignant neoplasm of endocervix: Secondary | ICD-10-CM | POA: Diagnosis not present

## 2019-05-10 NOTE — Patient Instructions (Signed)
Cisplatin injection What is this medicine? CISPLATIN (SIS pla tin) is a chemotherapy drug. It targets fast dividing cells, like cancer cells, and causes these cells to die. This medicine is used to treat many types of cancer like bladder, ovarian, and testicular cancers. This medicine may be used for other purposes; ask your health care provider or pharmacist if you have questions. COMMON BRAND NAME(S): Platinol, Platinol -AQ What should I tell my health care provider before I take this medicine? They need to know if you have any of these conditions:  blood disorders  hearing problems  kidney disease  recent or ongoing radiation therapy  an unusual or allergic reaction to cisplatin, carboplatin, other chemotherapy, other medicines, foods, dyes, or preservatives  pregnant or trying to get pregnant  breast-feeding How should I use this medicine? This drug is given as an infusion into a vein. It is administered in a hospital or clinic by a specially trained health care professional. Talk to your pediatrician regarding the use of this medicine in children. Special care may be needed. Overdosage: If you think you have taken too much of this medicine contact a poison control center or emergency room at once. NOTE: This medicine is only for you. Do not share this medicine with others. What if I miss a dose? It is important not to miss a dose. Call your doctor or health care professional if you are unable to keep an appointment. What may interact with this medicine?  dofetilide  foscarnet  medicines for seizures  medicines to increase blood counts like filgrastim, pegfilgrastim, sargramostim  probenecid  pyridoxine used with altretamine  rituximab  some antibiotics like amikacin, gentamicin, neomycin, polymyxin B, streptomycin, tobramycin  sulfinpyrazone  vaccines  zalcitabine Talk to your doctor or health care professional before taking any of these  medicines:  acetaminophen  aspirin  ibuprofen  ketoprofen  naproxen This list may not describe all possible interactions. Give your health care provider a list of all the medicines, herbs, non-prescription drugs, or dietary supplements you use. Also tell them if you smoke, drink alcohol, or use illegal drugs. Some items may interact with your medicine. What should I watch for while using this medicine? Your condition will be monitored carefully while you are receiving this medicine. You will need important blood work done while you are taking this medicine. This drug may make you feel generally unwell. This is not uncommon, as chemotherapy can affect healthy cells as well as cancer cells. Report any side effects. Continue your course of treatment even though you feel ill unless your doctor tells you to stop. In some cases, you may be given additional medicines to help with side effects. Follow all directions for their use. Call your doctor or health care professional for advice if you get a fever, chills or sore throat, or other symptoms of a cold or flu. Do not treat yourself. This drug decreases your body's ability to fight infections. Try to avoid being around people who are sick. This medicine may increase your risk to bruise or bleed. Call your doctor or health care professional if you notice any unusual bleeding. Be careful brushing and flossing your teeth or using a toothpick because you may get an infection or bleed more easily. If you have any dental work done, tell your dentist you are receiving this medicine. Avoid taking products that contain aspirin, acetaminophen, ibuprofen, naproxen, or ketoprofen unless instructed by your doctor. These medicines may hide a fever. Do not become pregnant while  taking this medicine. Women should inform their doctor if they wish to become pregnant or think they might be pregnant. There is a potential for serious side effects to an unborn child. Talk  to your health care professional or pharmacist for more information. Do not breast-feed an infant while taking this medicine. Drink fluids as directed while you are taking this medicine. This will help protect your kidneys. Call your doctor or health care professional if you get diarrhea. Do not treat yourself. What side effects may I notice from receiving this medicine? Side effects that you should report to your doctor or health care professional as soon as possible:  allergic reactions like skin rash, itching or hives, swelling of the face, lips, or tongue  signs of infection - fever or chills, cough, sore throat, pain or difficulty passing urine  signs of decreased platelets or bleeding - bruising, pinpoint red spots on the skin, black, tarry stools, nosebleeds  signs of decreased red blood cells - unusually weak or tired, fainting spells, lightheadedness  breathing problems  changes in hearing  gout pain  low blood counts - This drug may decrease the number of white blood cells, red blood cells and platelets. You may be at increased risk for infections and bleeding.  nausea and vomiting  pain, swelling, redness or irritation at the injection site  pain, tingling, numbness in the hands or feet  problems with balance, movement  trouble passing urine or change in the amount of urine Side effects that usually do not require medical attention (report to your doctor or health care professional if they continue or are bothersome):  changes in vision  loss of appetite  metallic taste in the mouth or changes in taste This list may not describe all possible side effects. Call your doctor for medical advice about side effects. You may report side effects to FDA at 1-800-FDA-1088. Where should I keep my medicine? This drug is given in a hospital or clinic and will not be stored at home. NOTE: This sheet is a summary. It may not cover all possible information. If you have questions  about this medicine, talk to your doctor, pharmacist, or health care provider.  2020 [employer] (2008-01-30 14:40:54)

## 2019-05-12 DIAGNOSIS — Z006 Encounter for examination for normal comparison and control in clinical research program: Secondary | ICD-10-CM | POA: Insufficient documentation

## 2019-05-12 DIAGNOSIS — Z7189 Other specified counseling: Secondary | ICD-10-CM | POA: Insufficient documentation

## 2019-05-12 NOTE — Progress Notes (Signed)
[place]  [phone] [phone]  ID: Mariah Mcmahon OB: 1966/05/13  MR#: [id number]  IBB#:[id number]  Patient Care Team: Venita Lick, NP as PCP - General (Nurse Practitioner) Clent Jacks, RN as Oncology Nurse Navigator  CHIEF COMPLAINT: Stage IIIb cervical cancer  INTERVAL HISTORY: Patient returns to clinic today for further evaluation and initiation of cycle 1 of weekly cisplatin.  She continues to be anxious, but otherwise feels well.  She continues to have abnormal vaginal bleeding. She has no neurologic complaints.  She denies any recent fevers or illnesses.  She has a good appetite and denies weight loss.  She has no chest pain, shortness of breath, cough, or hemoptysis.  She denies any nausea, vomiting, constipation, or diarrhea.  She has no urinary complaints.  Patient offers no further specific complaints today.  REVIEW OF SYSTEMS:   Review of Systems  Constitutional: Negative.  Negative for fever, malaise/fatigue and weight loss.  Respiratory: Negative.  Negative for cough and shortness of breath.   Cardiovascular: Negative.  Negative for chest pain and leg swelling.  Gastrointestinal: Negative.  Negative for abdominal pain.  Genitourinary: Negative.  Negative for hematuria.       Abnormal uterine bleeding.  Musculoskeletal: Negative.  Negative for back pain.  Skin: Negative.  Negative for rash.  Neurological: Negative.  Negative for dizziness, focal weakness, weakness and headaches.  Psychiatric/Behavioral: The patient is nervous/anxious.     As per HPI. Otherwise, a complete review of systems is negative.  PAST MEDICAL HISTORY: Past Medical History:  Diagnosis Date   No pertinent past medical history     PAST SURGICAL HISTORY: Past Surgical History:  Procedure Laterality Date   MASS EXCISION     Throat   TONSILLECTOMY      FAMILY HISTORY: Family History  Problem Relation Age of Onset   Brain cancer  Mother 44   Alcohol abuse Father    Heart disease Father    Prostate cancer Father    Heart attack Father    Diabetes Father     ADVANCED DIRECTIVES (Y/N):  N  HEALTH MAINTENANCE: Social History   Tobacco Use   Smoking status: Current Every Day Smoker    Packs/day: 0.50    Years: 30.00    Pack years: 15.00    Types: Cigarettes   Smokeless tobacco: Never Used  Substance Use Topics   Alcohol use: Not Currently   Drug use: Never     Colonoscopy:  PAP:  Bone density:  Lipid panel:  No Known Allergies  Current Outpatient Medications  Medication Sig Dispense Refill   meloxicam (MOBIC) 7.5 MG tablet Take 1 tablet (7.5 mg total) by mouth 2 (two) times a day. 120 tablet 1   methocarbamol (ROBAXIN) 500 MG tablet Take 1 tablet (500 mg total) by mouth 2 (two) times daily as needed for muscle spasms (for lower back pain). 120 tablet 1   ondansetron (ZOFRAN) 8 MG tablet Take 1 tablet (8 mg total) by mouth 2 (two) times daily as needed. Start on the third day after chemotherapy. 30 tablet 2   prochlorperazine (COMPAZINE) 10 MG tablet Take 1 tablet (10 mg total) by mouth every 6 (six) hours as needed (Nausea or vomiting). 60 tablet 2   traMADol (ULTRAM) 50 MG tablet Take 1 tablet (50 mg total) by mouth every 6 (six) hours as needed. 30 tablet 0   No current facility-administered medications for this visit.     OBJECTIVE: Vitals:  05/17/19 0929  BP: (!) 162/89  Pulse: 91  Temp: 97.7 F (36.5 C)     Body mass index is 22.45 kg/m.    ECOG FS:0 - Asymptomatic  General: Well-developed, well-nourished, no acute distress. Eyes: Pink conjunctiva, anicteric sclera. HEENT: Normocephalic, moist mucous membranes. Lungs: Clear to auscultation bilaterally. Heart: Regular rate and rhythm. No rubs, murmurs, or gallops. Abdomen: Soft, nontender, nondistended. No organomegaly noted, normoactive bowel sounds. Musculoskeletal: No edema, cyanosis, or clubbing. Neuro: Alert,  answering all questions appropriately. Cranial nerves grossly intact. Skin: No rashes or petechiae noted. Psych: Normal affect.   LAB RESULTS:  Lab Results  Component Value Date   NA 140 05/17/2019   K 4.8 05/17/2019   CL 103 05/17/2019   CO2 26 05/17/2019   GLUCOSE 104 (H) 05/17/2019   BUN 25 (H) 05/17/2019   CREATININE 1.28 (H) 05/17/2019   CALCIUM 9.5 05/17/2019   PROT 6.8 04/09/2019   ALBUMIN 4.0 04/09/2019   AST 24 04/09/2019   ALT 6 04/09/2019   ALKPHOS 79 04/09/2019   BILITOT 0.4 04/09/2019   GFRNONAA 49 (L) 05/17/2019   GFRAA 56 (L) 05/17/2019    Lab Results  Component Value Date   WBC 9.1 05/17/2019   NEUTROABS 6.3 05/17/2019   HGB 8.7 (L) 05/17/2019   HCT 27.0 (L) 05/17/2019   MCV 95.7 05/17/2019   PLT 411 (H) 05/17/2019     STUDIES: Ct Chest W Contrast  Result Date: 05/01/2019 CLINICAL DATA:  Abnormal Pap smear.  Cervical neoplasm.  Staging. EXAM: CT CHEST, ABDOMEN, AND PELVIS WITH CONTRAST TECHNIQUE: Multidetector CT imaging of the chest, abdomen and pelvis was performed following the standard protocol during bolus administration of intravenous contrast. CONTRAST:  59m OMNIPAQUE IOHEXOL 300 MG/ML  SOLN COMPARISON:  None. FINDINGS: CT CHEST FINDINGS Cardiovascular: The heart size is normal. No substantial pericardial effusion. No thoracic aortic aneurysm. Mediastinum/Nodes: No mediastinal lymphadenopathy. There is no hilar lymphadenopathy. Small lymph nodes are seen in the axillary regions bilaterally. The esophagus has normal imaging features. Lungs/Pleura: Scattered tiny centrilobular ground-glass nodules are seen in the lungs bilaterally with an upper lung predominance. Imaging features compatible with smoking related lung disease (respiratory bronchiolitis-associated intersitial lung disease). 3 mm right upper lobe solid nodule is visible on image 54/series 4. 3 mm right middle lobe nodule is visible on 76/4. 4 mm left lower lobe nodule identified on 101/4. No  focal airspace consolidation no pleural effusion. Musculoskeletal: No worrisome lytic or sclerotic osseous abnormality. CT ABDOMEN PELVIS FINDINGS Hepatobiliary: No suspicious focal abnormality within the liver parenchyma. There is no evidence for gallstones, gallbladder wall thickening, or pericholecystic fluid. No intrahepatic or extrahepatic biliary dilation. Pancreas: No focal mass lesion. No dilatation of the main duct. No intraparenchymal cyst. No peripancreatic edema. Spleen: No splenomegaly. No focal mass lesion. Adrenals/Urinary Tract: No adrenal nodule or mass. Mild to moderate right hydroureteronephrosis identified with right ureteral dilatation extending down to the right pelvic sidewall. Decreased perfusion to the right kidney is compatible with obstructive uropathy. Left kidney and ureter unremarkable. Bladder is nondistended. Stomach/Bowel: Stomach is unremarkable. No gastric wall thickening. No evidence of outlet obstruction. Duodenum is normally positioned as is the ligament of Treitz. No small bowel wall thickening. No small bowel dilatation. No gross colonic mass. No colonic wall thickening. Vascular/Lymphatic: There is abdominal aortic atherosclerosis without aneurysm. Portal vein and superior mesenteric vein are patent. Bulky retroperitoneal lymphadenopathy is identified in the abdomen. Nodal conglomeration circumferentially encases the aorta just inferior to the renal arteries. This  nodal mass measures 3.4 x 5.3 cm (image 58/series 2). Just proximal to the aortic bifurcation, nodal conglomeration encasing the distal aorta measures 3.5 x 3.2 cm. 1.3 cm short axis right common iliac node is visible on 77/2. 1.0 cm short axis lymph node is seen along the left common iliac chain (81/2). Lymphadenopathy is noted in the pelvic sidewalls bilaterally, right greater than left. Index 1.8 cm short axis right pelvic sidewall node is visible on 91/2. Another right pelvic sidewall lymph node measures 2.4 cm  short axis on 95/2. Reproductive: Uterus appears enlarged and heterogeneous, measuring 14.2 x 7.3 x 7.0 cm. Cervix is prominent heterogeneous with apparent wall thickening in the upper vagina. Other: Small volume free fluid noted adjacent to the liver and in the cul-de-sac. 3.3 x 4.9 cm irregular soft tissue lesion is identified to the left of the bladder on 102/2. Nodularity in the cul-de-sac is suspicious for peritoneal disease. Musculoskeletal: 2.2 cm mixed lytic and lucent lesion is identified in the left sacrum with tiny sclerotic foci evident in the left iliac bone. IMPRESSION: 1. Heterogeneous enlargement of the uterus and cervix with apparent soft tissue thickening in the upper vagina. Cervical and vaginal tissues not well evaluated by CT. 2. Bulky retroperitoneal lymphadenopathy in the abdomen with bilateral common iliac and pelvic sidewall lymphadenopathy in the pelvis. Imaging features consistent with metastatic disease. 3. 5 cm soft tissue lesion to the left of the bladder is consistent with a metastatic deposit. 4. Mild to moderate right hydroureteronephrosis with decreased perfusion to the right kidney. Right ureteral obstruction is at the level of the right pelvic sidewall. 5. 2.2 cm mixed lytic and lucent lesion in the left sacrum, indeterminate, but metastatic disease not excluded. 6. Several 3-4 mm nodules identified in the lungs. Close attention on follow-up recommended as metastatic disease not excluded. 7. Small volume ascites. Electronically Signed   By: Misty Stanley M.D.   On: 05/01/2019 16:38   Ct Abdomen Pelvis W Contrast  Result Date: 05/01/2019 CLINICAL DATA:  Abnormal Pap smear.  Cervical neoplasm.  Staging. EXAM: CT CHEST, ABDOMEN, AND PELVIS WITH CONTRAST TECHNIQUE: Multidetector CT imaging of the chest, abdomen and pelvis was performed following the standard protocol during bolus administration of intravenous contrast. CONTRAST:  63m OMNIPAQUE IOHEXOL 300 MG/ML  SOLN COMPARISON:   None. FINDINGS: CT CHEST FINDINGS Cardiovascular: The heart size is normal. No substantial pericardial effusion. No thoracic aortic aneurysm. Mediastinum/Nodes: No mediastinal lymphadenopathy. There is no hilar lymphadenopathy. Small lymph nodes are seen in the axillary regions bilaterally. The esophagus has normal imaging features. Lungs/Pleura: Scattered tiny centrilobular ground-glass nodules are seen in the lungs bilaterally with an upper lung predominance. Imaging features compatible with smoking related lung disease (respiratory bronchiolitis-associated intersitial lung disease). 3 mm right upper lobe solid nodule is visible on image 54/series 4. 3 mm right middle lobe nodule is visible on 76/4. 4 mm left lower lobe nodule identified on 101/4. No focal airspace consolidation no pleural effusion. Musculoskeletal: No worrisome lytic or sclerotic osseous abnormality. CT ABDOMEN PELVIS FINDINGS Hepatobiliary: No suspicious focal abnormality within the liver parenchyma. There is no evidence for gallstones, gallbladder wall thickening, or pericholecystic fluid. No intrahepatic or extrahepatic biliary dilation. Pancreas: No focal mass lesion. No dilatation of the main duct. No intraparenchymal cyst. No peripancreatic edema. Spleen: No splenomegaly. No focal mass lesion. Adrenals/Urinary Tract: No adrenal nodule or mass. Mild to moderate right hydroureteronephrosis identified with right ureteral dilatation extending down to the right pelvic sidewall. Decreased perfusion to the  right kidney is compatible with obstructive uropathy. Left kidney and ureter unremarkable. Bladder is nondistended. Stomach/Bowel: Stomach is unremarkable. No gastric wall thickening. No evidence of outlet obstruction. Duodenum is normally positioned as is the ligament of Treitz. No small bowel wall thickening. No small bowel dilatation. No gross colonic mass. No colonic wall thickening. Vascular/Lymphatic: There is abdominal aortic  atherosclerosis without aneurysm. Portal vein and superior mesenteric vein are patent. Bulky retroperitoneal lymphadenopathy is identified in the abdomen. Nodal conglomeration circumferentially encases the aorta just inferior to the renal arteries. This nodal mass measures 3.4 x 5.3 cm (image 58/series 2). Just proximal to the aortic bifurcation, nodal conglomeration encasing the distal aorta measures 3.5 x 3.2 cm. 1.3 cm short axis right common iliac node is visible on 77/2. 1.0 cm short axis lymph node is seen along the left common iliac chain (81/2). Lymphadenopathy is noted in the pelvic sidewalls bilaterally, right greater than left. Index 1.8 cm short axis right pelvic sidewall node is visible on 91/2. Another right pelvic sidewall lymph node measures 2.4 cm short axis on 95/2. Reproductive: Uterus appears enlarged and heterogeneous, measuring 14.2 x 7.3 x 7.0 cm. Cervix is prominent heterogeneous with apparent wall thickening in the upper vagina. Other: Small volume free fluid noted adjacent to the liver and in the cul-de-sac. 3.3 x 4.9 cm irregular soft tissue lesion is identified to the left of the bladder on 102/2. Nodularity in the cul-de-sac is suspicious for peritoneal disease. Musculoskeletal: 2.2 cm mixed lytic and lucent lesion is identified in the left sacrum with tiny sclerotic foci evident in the left iliac bone. IMPRESSION: 1. Heterogeneous enlargement of the uterus and cervix with apparent soft tissue thickening in the upper vagina. Cervical and vaginal tissues not well evaluated by CT. 2. Bulky retroperitoneal lymphadenopathy in the abdomen with bilateral common iliac and pelvic sidewall lymphadenopathy in the pelvis. Imaging features consistent with metastatic disease. 3. 5 cm soft tissue lesion to the left of the bladder is consistent with a metastatic deposit. 4. Mild to moderate right hydroureteronephrosis with decreased perfusion to the right kidney. Right ureteral obstruction is at the  level of the right pelvic sidewall. 5. 2.2 cm mixed lytic and lucent lesion in the left sacrum, indeterminate, but metastatic disease not excluded. 6. Several 3-4 mm nodules identified in the lungs. Close attention on follow-up recommended as metastatic disease not excluded. 7. Small volume ascites. Electronically Signed   By: Misty Stanley M.D.   On: 05/01/2019 16:38   Nm Pet Image Initial (pi) Skull Base To Thigh  Result Date: 05/07/2019 CLINICAL DATA:  Initial treatment strategy for cervical cancer. EXAM: NUCLEAR MEDICINE PET SKULL BASE TO THIGH TECHNIQUE: 6.5 mCi F-18 FDG was injected intravenously. Full-ring PET imaging was performed from the skull base to thigh after the radiotracer. CT data was obtained and used for attenuation correction and anatomic localization. Fasting blood glucose: 94 mg/dl COMPARISON:  05/01/2019 CT chest, abdomen and pelvis. FINDINGS: Mediastinal blood pool activity: SUV max 2.4 Liver activity: SUV max NA NECK: No hypermetabolic lymph nodes in the neck. Incidental CT findings: none CHEST: No hypermetabolic axillary, mediastinal or hilar lymph nodes. No hypermetabolic pulmonary findings. Incidental CT findings: No acute consolidative airspace disease or lung masses. A few scattered small solid pulmonary nodules in both lungs, largest 4 mm in the medial basilar left lower lobe (series 3/image 119), below PET resolution. ABDOMEN/PELVIS: Hypermetabolic bilateral retrocrural lymphadenopathy. Representative 1.0 cm left retrocrural node with max SUV 7.0 (series 3/image 127). Multiple enlarged hypermetabolic  left para-aortic and aortocaval nodes. Representative 2.9 cm left para-aortic node with max SUV 12.8 (series 3/image 152). Representative 1.9 cm aortocaval node with max SUV 9.7 (series 3/image 158). Hypermetabolic bilateral common iliac, right external iliac and right internal iliac lymphadenopathy. Representative 2.1 cm left common iliac node with max SUV 12.3 (series 3/image 186).  Representative 1.8 cm right external iliac node with max SUV 16.5 (series 3/image 212). Representative 1.3 cm right internal iliac node with max SUV 13.1 (series 3/image 202). Hypermetabolic mass in the cervix with max SUV 19.1, poorly delineated on the noncontrast CT images. Hypermetabolism throughout the enlarged lower uterine segment and body of the uterus compatible with malignant involvement of the uterus with max SUV 14.7. Small foci of hypermetabolism at the periphery of the tampon within the anterior vagina without mass correlate on the CT images, favor urinary contamination. Hypermetabolic 6.1 x 3.4 cm soft tissue implant in the anterior left pelvis (series 3/image 216) surrounded by small volume pelvic ascites with max SUV 6.5 (series 3/image 216). No abnormal hypermetabolic activity within the liver, pancreas, adrenal glands, or spleen. Incidental CT findings: Mildly atherosclerotic nonaneurysmal abdominal aorta. Mild right hydroureteronephrosis to the level of the upper pelvic right ureter. SKELETON: No focal hypermetabolic activity to suggest skeletal metastasis. No hypermetabolism associated with a mixed lytic and sclerotic upper left sacral lesion on series 3/image 192. Incidental CT findings: none IMPRESSION: 1. Hypermetabolic mass in the cervix compatible with known primary cervical malignancy. Hypermetabolism throughout the enlarged lower uterine segment and body of the uterus compatible with malignant involvement of the uterus. 2. Hypermetabolic right internal and external iliac, bilateral common iliac, aortocaval, left para-aortic and bilateral retrocrural lymphadenopathy compatible with metastatic nodal disease. 3. Hypermetabolic soft tissue implant in the anterior left pelvis surrounded by small volume pelvic ascites, favor pelvic peritoneal metastasis. 4. No hypermetabolic osseous metastatic disease. No hypermetabolism associated with the mixed lytic and sclerotic upper left sacral lesion,  which is probably benign. 5. Tiny bilateral scattered solid pulmonary nodules, below PET resolution, indeterminate, recommend attention on close chest CT follow-up in 3 months. 6.  Aortic Atherosclerosis (ICD10-I70.0). Electronically Signed   By: Ilona Sorrel M.D.   On: 05/07/2019 16:02    ASSESSMENT: Stage IIIb cervical cancer.  PLAN:    1.  Stage IIIb cervical cancer: Biopsy results from April 24, 2019 confirmed the diagnosis.  PET scan results from May 07, 2019 reviewed independently and reported as above with extensive nodal disease as well as mass-effect causing possible hydronephrosis.  Case discussed with gynecology oncology as well as radiation oncology.  Patient is not a surgical candidate as of right now, but will likely benefit from daily XRT along with concurrent weekly cisplatin 40 mg/m2.  Plan to reimage after the conclusion of XRT to determine if brachii therapy is possible.  We will also considered rebiopsy with interventional radiology to obtain additional tissue for PDL-1 testing.  If there is still residual disease, she benefit from systemic treatment with carboplatinum/Taxol or cisplatin/Taxol plus or minus bevacizumab.  Proceed with cycle 1 of weekly cisplatin today. 2.  Hydronephrosis: Seen on CT scan.  Monitor closely. 3.  Venous access: Port placement was discussed, but patient would like to defer at this time. 4.  Renal insufficiency: Patient's creatinine has increased to 1.28.  Monitor. 5.  Anemia: Patient's hemoglobin is 8.7, will draw iron stores with next laboratory visit and consider IV iron in the future.   Patient expressed understanding and was in agreement with this plan.  She also understands that She can call clinic at any time with any questions, concerns, or complaints.   Cancer Staging Cervical cancer ([place]) Staging form: Cervix Uteri, AJCC 8th Edition - Clinical: FIGO Stage IIIB (cT3b, cN1, cM0) - Signed by Lloyd Huger, MD on 05/09/2019   Lloyd Huger, MD   05/20/2019 6:19 AM

## 2019-05-14 ENCOUNTER — Other Ambulatory Visit: Payer: Self-pay

## 2019-05-14 ENCOUNTER — Inpatient Hospital Stay: Payer: BC Managed Care – PPO

## 2019-05-14 DIAGNOSIS — N289 Disorder of kidney and ureter, unspecified: Secondary | ICD-10-CM | POA: Diagnosis not present

## 2019-05-14 DIAGNOSIS — C530 Malignant neoplasm of endocervix: Secondary | ICD-10-CM | POA: Diagnosis not present

## 2019-05-14 DIAGNOSIS — Z5111 Encounter for antineoplastic chemotherapy: Secondary | ICD-10-CM | POA: Diagnosis not present

## 2019-05-14 DIAGNOSIS — C539 Malignant neoplasm of cervix uteri, unspecified: Secondary | ICD-10-CM | POA: Diagnosis not present

## 2019-05-14 DIAGNOSIS — C772 Secondary and unspecified malignant neoplasm of intra-abdominal lymph nodes: Secondary | ICD-10-CM | POA: Diagnosis not present

## 2019-05-14 DIAGNOSIS — Z510 Encounter for antineoplastic radiation therapy: Secondary | ICD-10-CM | POA: Diagnosis not present

## 2019-05-14 DIAGNOSIS — D649 Anemia, unspecified: Secondary | ICD-10-CM | POA: Diagnosis not present

## 2019-05-16 ENCOUNTER — Ambulatory Visit
Admission: RE | Admit: 2019-05-16 | Discharge: 2019-05-16 | Disposition: A | Discharge status: Home / Self Care | Payer: BC Managed Care – PPO | Source: Ambulatory Visit | Attending: Radiation Oncology | Admitting: Radiation Oncology

## 2019-05-16 ENCOUNTER — Other Ambulatory Visit: Payer: Self-pay

## 2019-05-16 ENCOUNTER — Encounter: Payer: BLUE CROSS/BLUE SHIELD | Admitting: Nurse Practitioner

## 2019-05-16 DIAGNOSIS — C539 Malignant neoplasm of cervix uteri, unspecified: Secondary | ICD-10-CM | POA: Diagnosis not present

## 2019-05-16 DIAGNOSIS — C772 Secondary and unspecified malignant neoplasm of intra-abdominal lymph nodes: Secondary | ICD-10-CM | POA: Diagnosis not present

## 2019-05-16 DIAGNOSIS — N289 Disorder of kidney and ureter, unspecified: Secondary | ICD-10-CM | POA: Diagnosis not present

## 2019-05-16 DIAGNOSIS — D649 Anemia, unspecified: Secondary | ICD-10-CM | POA: Diagnosis not present

## 2019-05-16 DIAGNOSIS — Z510 Encounter for antineoplastic radiation therapy: Secondary | ICD-10-CM | POA: Diagnosis not present

## 2019-05-16 DIAGNOSIS — C530 Malignant neoplasm of endocervix: Secondary | ICD-10-CM | POA: Diagnosis not present

## 2019-05-16 DIAGNOSIS — Z5111 Encounter for antineoplastic chemotherapy: Secondary | ICD-10-CM | POA: Diagnosis not present

## 2019-05-17 ENCOUNTER — Other Ambulatory Visit: Payer: Self-pay

## 2019-05-17 ENCOUNTER — Inpatient Hospital Stay (HOSPITAL_BASED_OUTPATIENT_CLINIC_OR_DEPARTMENT_OTHER): Payer: BC Managed Care – PPO | Admitting: Oncology

## 2019-05-17 ENCOUNTER — Encounter: Payer: Self-pay | Admitting: Oncology

## 2019-05-17 ENCOUNTER — Inpatient Hospital Stay: Payer: BC Managed Care – PPO

## 2019-05-17 ENCOUNTER — Ambulatory Visit
Admission: RE | Admit: 2019-05-17 | Discharge: 2019-05-17 | Disposition: A | Discharge status: Home / Self Care | Payer: BC Managed Care – PPO | Source: Ambulatory Visit | Attending: Radiation Oncology | Admitting: Radiation Oncology

## 2019-05-17 ENCOUNTER — Other Ambulatory Visit: Payer: Self-pay | Admitting: *Deleted

## 2019-05-17 VITALS — BP 162/89 | HR 91 | Temp 97.7°F | Ht 61.0 in | Wt 118.8 lb

## 2019-05-17 DIAGNOSIS — N131 Hydronephrosis with ureteral stricture, not elsewhere classified: Secondary | ICD-10-CM

## 2019-05-17 DIAGNOSIS — C539 Malignant neoplasm of cervix uteri, unspecified: Secondary | ICD-10-CM

## 2019-05-17 DIAGNOSIS — Z5111 Encounter for antineoplastic chemotherapy: Secondary | ICD-10-CM | POA: Diagnosis not present

## 2019-05-17 DIAGNOSIS — N289 Disorder of kidney and ureter, unspecified: Secondary | ICD-10-CM | POA: Diagnosis not present

## 2019-05-17 DIAGNOSIS — C772 Secondary and unspecified malignant neoplasm of intra-abdominal lymph nodes: Secondary | ICD-10-CM | POA: Diagnosis not present

## 2019-05-17 DIAGNOSIS — C530 Malignant neoplasm of endocervix: Secondary | ICD-10-CM | POA: Diagnosis not present

## 2019-05-17 DIAGNOSIS — F17210 Nicotine dependence, cigarettes, uncomplicated: Secondary | ICD-10-CM

## 2019-05-17 DIAGNOSIS — Z510 Encounter for antineoplastic radiation therapy: Secondary | ICD-10-CM | POA: Diagnosis not present

## 2019-05-17 DIAGNOSIS — Z7189 Other specified counseling: Secondary | ICD-10-CM

## 2019-05-17 DIAGNOSIS — Z79899 Other long term (current) drug therapy: Secondary | ICD-10-CM

## 2019-05-17 DIAGNOSIS — D649 Anemia, unspecified: Secondary | ICD-10-CM

## 2019-05-17 DIAGNOSIS — C786 Secondary malignant neoplasm of retroperitoneum and peritoneum: Secondary | ICD-10-CM

## 2019-05-17 LAB — CBC WITH DIFFERENTIAL/PLATELET
Abs Immature Granulocytes: 0.02 10*3/uL (ref 0.00–0.07)
Basophils Absolute: 0.1 10*3/uL (ref 0.0–0.1)
Basophils Relative: 1 %
Eosinophils Absolute: 0.2 10*3/uL (ref 0.0–0.5)
Eosinophils Relative: 2 %
HCT: 27 % — ABNORMAL LOW (ref 36.0–46.0)
Hemoglobin: 8.7 g/dL — ABNORMAL LOW (ref 12.0–15.0)
Immature Granulocytes: 0 %
Lymphocytes Relative: 16 %
Lymphs Abs: 1.5 10*3/uL (ref 0.7–4.0)
MCH: 30.9 pg (ref 26.0–34.0)
MCHC: 32.2 g/dL (ref 30.0–36.0)
MCV: 95.7 fL (ref 80.0–100.0)
Monocytes Absolute: 1.1 10*3/uL — ABNORMAL HIGH (ref 0.1–1.0)
Monocytes Relative: 12 %
Neutro Abs: 6.3 10*3/uL (ref 1.7–7.7)
Neutrophils Relative %: 69 %
Platelets: 411 10*3/uL — ABNORMAL HIGH (ref 150–400)
RBC: 2.82 MIL/uL — ABNORMAL LOW (ref 3.87–5.11)
RDW: 13.3 % (ref 11.5–15.5)
WBC: 9.1 10*3/uL (ref 4.0–10.5)
nRBC: 0 % (ref 0.0–0.2)

## 2019-05-17 LAB — BASIC METABOLIC PANEL
Anion gap: 11 (ref 5–15)
BUN: 25 mg/dL — ABNORMAL HIGH (ref 6–20)
CO2: 26 mmol/L (ref 22–32)
Calcium: 9.5 mg/dL (ref 8.9–10.3)
Chloride: 103 mmol/L (ref 98–111)
Creatinine, Ser: 1.28 mg/dL — ABNORMAL HIGH (ref 0.44–1.00)
GFR calc Af Amer: 56 mL/min — ABNORMAL LOW (ref >60)
GFR calc non Af Amer: 49 mL/min — ABNORMAL LOW (ref >60)
Glucose, Bld: 104 mg/dL — ABNORMAL HIGH (ref 70–99)
Potassium: 4.8 mmol/L (ref 3.5–5.1)
Sodium: 140 mmol/L (ref 135–145)

## 2019-05-17 MED ORDER — PALONOSETRON HCL INJECTION 0.25 MG/5ML
0.2500 mg | Freq: Once | INTRAVENOUS | Status: AC
  Administered 2019-05-17: 13:00:00 0.25 mg via INTRAVENOUS
  Filled 2019-05-17: qty 5

## 2019-05-17 MED ORDER — SODIUM CHLORIDE 0.9 % IV SOLN
Freq: Once | INTRAVENOUS | Status: AC
  Administered 2019-05-17: 13:00:00 via INTRAVENOUS
  Filled 2019-05-17: qty 5

## 2019-05-17 MED ORDER — TRAMADOL HCL 50 MG PO TABS: 50.0000 mg | ORAL_TABLET | Freq: Four times a day (QID) | ORAL | 0 refills | Status: DC | PRN

## 2019-05-17 MED ORDER — POTASSIUM CHLORIDE 2 MEQ/ML IV SOLN
Freq: Once | INTRAVENOUS | Status: AC
  Administered 2019-05-17: 11:00:00 via INTRAVENOUS
  Filled 2019-05-17: qty 1000

## 2019-05-17 MED ORDER — SODIUM CHLORIDE 0.9 % IV SOLN
40.0000 mg/m2 | Freq: Once | INTRAVENOUS | Status: AC
  Administered 2019-05-17: 14:00:00 61 mg via INTRAVENOUS
  Filled 2019-05-17: qty 61

## 2019-05-17 MED ORDER — SODIUM CHLORIDE 0.9 % IV SOLN
Freq: Once | INTRAVENOUS | Status: AC
  Administered 2019-05-17: 11:00:00 via INTRAVENOUS
  Filled 2019-05-17: qty 250

## 2019-05-17 NOTE — Progress Notes (Signed)
Patient is here today to start her new chemo therapy. Patient would like to get a refill on her Meloxicam.

## 2019-05-17 NOTE — Progress Notes (Signed)
[place]  Telephone:([phone] [phone]  Patient Care Team: Venita Lick, NP as PCP - General (Nurse Practitioner) Clent Jacks, RN as Oncology Nurse Navigator   Name of the patient: Mariah Mcmahon  [id number]  1966-04-05   Date of visit: 05/17/19  Diagnosis-stage IIIb cervical cancer  Chief complaint/Reason for visit- Initial Meeting for [facility], preparing for starting chemotherapy  Heme/Onc history:  Oncology History  Cervical cancer ([place])  05/04/2019 Initial Diagnosis   Cervical cancer ([place])   05/09/2019 Cancer Staging   Staging form: Cervix Uteri, AJCC 8th Edition - Clinical: FIGO Stage IIIB (cT3b, cN1, cM0) - Signed by Lloyd Huger, MD on 05/09/2019   05/17/2019 -  Chemotherapy   The patient had palonosetron (ALOXI) injection 0.25 mg, 0.25 mg, Intravenous,  Once, 1 of 7 cycles CISplatin (PLATINOL) 61 mg in sodium chloride 0.9 % 250 mL chemo infusion, 40 mg/m2 = 61 mg, Intravenous,  Once, 1 of 7 cycles fosaprepitant (EMEND) 150 mg, dexamethasone (DECADRON) 12 mg in sodium chloride 0.9 % 145 mL IVPB, , Intravenous,  Once, 1 of 7 cycles  for chemotherapy treatment.      Interval history-patient is a 53 year old female who presents to chemo care clinic today for initial meeting in preparation for starting chemotherapy. I introduced the chemo care clinic and we discussed that the role of the clinic is to assist those who are at an increased risk of emergency room visits and/or complications during the course of chemotherapy treatment. We discussed that the increased risk takes into account factors such as age, performance status, and co-morbidities. We also discussed that for some, this might include barriers to care such as not having a primary care provider, lack of insurance/transportation, or not being able to afford medications. We discussed that the goal of the program is to help prevent  unplanned ER visits and help reduce complications during chemotherapy. We do this by discussing specific risk factors to each individual and identifying ways that we can help improve these risk factors and reduce barriers to care.   ECOG FS:1 - Symptomatic but completely ambulatory  Review of systems- Review of Systems  Constitutional: Positive for malaise/fatigue. Negative for chills, fever and weight loss.  HENT: Negative for congestion, ear pain and tinnitus.   Eyes: Negative.  Negative for blurred vision and double vision.  Respiratory: Negative.  Negative for cough, sputum production and shortness of breath.   Cardiovascular: Negative.  Negative for chest pain, palpitations and leg swelling.  Gastrointestinal: Positive for abdominal pain. Negative for constipation, diarrhea, nausea and vomiting.  Genitourinary: Positive for flank pain. Negative for dysuria, frequency and urgency.  Musculoskeletal: Negative for back pain and falls.  Skin: Negative.  Negative for rash.  Neurological: Negative.  Negative for weakness and headaches.  Endo/Heme/Allergies: Negative.  Does not bruise/bleed easily.  Psychiatric/Behavioral: Negative for depression. The patient is nervous/anxious. The patient does not have insomnia.      Current treatment-cycle 1 weekly cisplatin with concurrent radiation beginning today.  No Known Allergies  Past Medical History:  Diagnosis Date   No pertinent past medical history     Past Surgical History:  Procedure Laterality Date   MASS EXCISION     Throat   TONSILLECTOMY      Social History   Socioeconomic History   Marital status: Divorced    Spouse name: Not on file   Number of children: Not on file   Years  of education: Not on file   Highest education level: Not on file  Occupational History   Occupation: [employer]    Employer: [employer] resource strain: Not hard at all   Food insecurity    Worry:  Never true    Inability: Never true   Transportation needs    Medical: No    Non-medical: No  Tobacco Use   Smoking status: Current Every Day Smoker    Packs/day: 0.50    Years: 30.00    Pack years: 15.00    Types: Cigarettes   Smokeless tobacco: Never Used  Substance and Sexual Activity   Alcohol use: Not Currently   Drug use: Never   Sexual activity: Yes    Comment: not at moment  Lifestyle   Physical activity    Days per week: 6 days    Minutes per session: 40 min   Stress: Not at all  Relationships   Social connections    Talks on phone: More than three times a week    Gets together: More than three times a week    Attends religious service: Never    Active member of club or organization: No    Attends meetings of clubs or organizations: Never    Relationship status: Divorced   Intimate partner violence    Fear of current or ex partner: No    Emotionally abused: No    Physically abused: No    Forced sexual activity: No  Other Topics Concern   Not on file  Social History Narrative   Not on file    Family History  Problem Relation Age of Onset   Brain cancer Mother 72   Alcohol abuse Father    Heart disease Father    Prostate cancer Father    Heart attack Father    Diabetes Father      Current Outpatient Medications:    meloxicam (MOBIC) 7.5 MG tablet, Take 1 tablet (7.5 mg total) by mouth 2 (two) times a day., Disp: 120 tablet, Rfl: 1   methocarbamol (ROBAXIN) 500 MG tablet, Take 1 tablet (500 mg total) by mouth 2 (two) times daily as needed for muscle spasms (for lower back pain)., Disp: 120 tablet, Rfl: 1   ondansetron (ZOFRAN) 8 MG tablet, Take 1 tablet (8 mg total) by mouth 2 (two) times daily as needed. Start on the third day after chemotherapy., Disp: 30 tablet, Rfl: 2   prochlorperazine (COMPAZINE) 10 MG tablet, Take 1 tablet (10 mg total) by mouth every 6 (six) hours as needed (Nausea or vomiting)., Disp: 60 tablet, Rfl: 2    traMADol (ULTRAM) 50 MG tablet, Take 1 tablet (50 mg total) by mouth every 6 (six) hours as needed., Disp: 30 tablet, Rfl: 0  Physical exam: There were no vitals filed for this visit. Physical Exam Constitutional:      Appearance: Normal appearance.  HENT:     Head: Normocephalic and atraumatic.  Eyes:     Pupils: Pupils are equal, round, and reactive to light.  Neck:     Musculoskeletal: Normal range of motion.  Cardiovascular:     Rate and Rhythm: Normal rate and regular rhythm.     Heart sounds: Normal heart sounds. No murmur.  Pulmonary:     Effort: Pulmonary effort is normal.     Breath sounds: Normal breath sounds. No wheezing.  Abdominal:     General: Bowel sounds are normal. There is no distension.  Palpations: Abdomen is soft.     Tenderness: There is no abdominal tenderness.  Musculoskeletal: Normal range of motion.  Skin:    General: Skin is warm and dry.     Findings: No rash.  Neurological:     Mental Status: She is alert and oriented to person, place, and time.  Psychiatric:        Judgment: Judgment normal.      CMP Latest Ref Rng & Units 05/17/2019  Glucose 70 - 99 mg/dL 104(H)  BUN 6 - 20 mg/dL 25(H)  Creatinine 0.44 - 1.00 mg/dL 1.28(H)  Sodium 135 - 145 mmol/L 140  Potassium 3.5 - 5.1 mmol/L 4.8  Chloride 98 - 111 mmol/L 103  CO2 22 - 32 mmol/L 26  Calcium 8.9 - 10.3 mg/dL 9.5  Total Protein 6.0 - 8.5 g/dL -  Total Bilirubin 0.0 - 1.2 mg/dL -  Alkaline Phos 39 - 117 IU/L -  AST 0 - 40 IU/L -  ALT 0 - 32 IU/L -   CBC Latest Ref Rng & Units 05/17/2019  WBC 4.0 - 10.5 K/uL 9.1  Hemoglobin 12.0 - 15.0 g/dL 8.7(L)  Hematocrit 36.0 - 46.0 % 27.0(L)  Platelets 150 - 400 K/uL 411(H)    No images are attached to the encounter.  Ct Chest W Contrast  Result Date: 05/01/2019 CLINICAL DATA:  Abnormal Pap smear.  Cervical neoplasm.  Staging. EXAM: CT CHEST, ABDOMEN, AND PELVIS WITH CONTRAST TECHNIQUE: Multidetector CT imaging of the chest, abdomen and  pelvis was performed following the standard protocol during bolus administration of intravenous contrast. CONTRAST:  82m OMNIPAQUE IOHEXOL 300 MG/ML  SOLN COMPARISON:  None. FINDINGS: CT CHEST FINDINGS Cardiovascular: The heart size is normal. No substantial pericardial effusion. No thoracic aortic aneurysm. Mediastinum/Nodes: No mediastinal lymphadenopathy. There is no hilar lymphadenopathy. Small lymph nodes are seen in the axillary regions bilaterally. The esophagus has normal imaging features. Lungs/Pleura: Scattered tiny centrilobular ground-glass nodules are seen in the lungs bilaterally with an upper lung predominance. Imaging features compatible with smoking related lung disease (respiratory bronchiolitis-associated intersitial lung disease). 3 mm right upper lobe solid nodule is visible on image 54/series 4. 3 mm right middle lobe nodule is visible on 76/4. 4 mm left lower lobe nodule identified on 101/4. No focal airspace consolidation no pleural effusion. Musculoskeletal: No worrisome lytic or sclerotic osseous abnormality. CT ABDOMEN PELVIS FINDINGS Hepatobiliary: No suspicious focal abnormality within the liver parenchyma. There is no evidence for gallstones, gallbladder wall thickening, or pericholecystic fluid. No intrahepatic or extrahepatic biliary dilation. Pancreas: No focal mass lesion. No dilatation of the main duct. No intraparenchymal cyst. No peripancreatic edema. Spleen: No splenomegaly. No focal mass lesion. Adrenals/Urinary Tract: No adrenal nodule or mass. Mild to moderate right hydroureteronephrosis identified with right ureteral dilatation extending down to the right pelvic sidewall. Decreased perfusion to the right kidney is compatible with obstructive uropathy. Left kidney and ureter unremarkable. Bladder is nondistended. Stomach/Bowel: Stomach is unremarkable. No gastric wall thickening. No evidence of outlet obstruction. Duodenum is normally positioned as is the ligament of Treitz.  No small bowel wall thickening. No small bowel dilatation. No gross colonic mass. No colonic wall thickening. Vascular/Lymphatic: There is abdominal aortic atherosclerosis without aneurysm. Portal vein and superior mesenteric vein are patent. Bulky retroperitoneal lymphadenopathy is identified in the abdomen. Nodal conglomeration circumferentially encases the aorta just inferior to the renal arteries. This nodal mass measures 3.4 x 5.3 cm (image 58/series 2). Just proximal to the aortic bifurcation, nodal conglomeration encasing the  distal aorta measures 3.5 x 3.2 cm. 1.3 cm short axis right common iliac node is visible on 77/2. 1.0 cm short axis lymph node is seen along the left common iliac chain (81/2). Lymphadenopathy is noted in the pelvic sidewalls bilaterally, right greater than left. Index 1.8 cm short axis right pelvic sidewall node is visible on 91/2. Another right pelvic sidewall lymph node measures 2.4 cm short axis on 95/2. Reproductive: Uterus appears enlarged and heterogeneous, measuring 14.2 x 7.3 x 7.0 cm. Cervix is prominent heterogeneous with apparent wall thickening in the upper vagina. Other: Small volume free fluid noted adjacent to the liver and in the cul-de-sac. 3.3 x 4.9 cm irregular soft tissue lesion is identified to the left of the bladder on 102/2. Nodularity in the cul-de-sac is suspicious for peritoneal disease. Musculoskeletal: 2.2 cm mixed lytic and lucent lesion is identified in the left sacrum with tiny sclerotic foci evident in the left iliac bone. IMPRESSION: 1. Heterogeneous enlargement of the uterus and cervix with apparent soft tissue thickening in the upper vagina. Cervical and vaginal tissues not well evaluated by CT. 2. Bulky retroperitoneal lymphadenopathy in the abdomen with bilateral common iliac and pelvic sidewall lymphadenopathy in the pelvis. Imaging features consistent with metastatic disease. 3. 5 cm soft tissue lesion to the left of the bladder is consistent with  a metastatic deposit. 4. Mild to moderate right hydroureteronephrosis with decreased perfusion to the right kidney. Right ureteral obstruction is at the level of the right pelvic sidewall. 5. 2.2 cm mixed lytic and lucent lesion in the left sacrum, indeterminate, but metastatic disease not excluded. 6. Several 3-4 mm nodules identified in the lungs. Close attention on follow-up recommended as metastatic disease not excluded. 7. Small volume ascites. Electronically Signed   By: Misty Stanley M.D.   On: 05/01/2019 16:38   Ct Abdomen Pelvis W Contrast  Result Date: 05/01/2019 CLINICAL DATA:  Abnormal Pap smear.  Cervical neoplasm.  Staging. EXAM: CT CHEST, ABDOMEN, AND PELVIS WITH CONTRAST TECHNIQUE: Multidetector CT imaging of the chest, abdomen and pelvis was performed following the standard protocol during bolus administration of intravenous contrast. CONTRAST:  15m OMNIPAQUE IOHEXOL 300 MG/ML  SOLN COMPARISON:  None. FINDINGS: CT CHEST FINDINGS Cardiovascular: The heart size is normal. No substantial pericardial effusion. No thoracic aortic aneurysm. Mediastinum/Nodes: No mediastinal lymphadenopathy. There is no hilar lymphadenopathy. Small lymph nodes are seen in the axillary regions bilaterally. The esophagus has normal imaging features. Lungs/Pleura: Scattered tiny centrilobular ground-glass nodules are seen in the lungs bilaterally with an upper lung predominance. Imaging features compatible with smoking related lung disease (respiratory bronchiolitis-associated intersitial lung disease). 3 mm right upper lobe solid nodule is visible on image 54/series 4. 3 mm right middle lobe nodule is visible on 76/4. 4 mm left lower lobe nodule identified on 101/4. No focal airspace consolidation no pleural effusion. Musculoskeletal: No worrisome lytic or sclerotic osseous abnormality. CT ABDOMEN PELVIS FINDINGS Hepatobiliary: No suspicious focal abnormality within the liver parenchyma. There is no evidence for  gallstones, gallbladder wall thickening, or pericholecystic fluid. No intrahepatic or extrahepatic biliary dilation. Pancreas: No focal mass lesion. No dilatation of the main duct. No intraparenchymal cyst. No peripancreatic edema. Spleen: No splenomegaly. No focal mass lesion. Adrenals/Urinary Tract: No adrenal nodule or mass. Mild to moderate right hydroureteronephrosis identified with right ureteral dilatation extending down to the right pelvic sidewall. Decreased perfusion to the right kidney is compatible with obstructive uropathy. Left kidney and ureter unremarkable. Bladder is nondistended. Stomach/Bowel: Stomach is unremarkable. No  gastric wall thickening. No evidence of outlet obstruction. Duodenum is normally positioned as is the ligament of Treitz. No small bowel wall thickening. No small bowel dilatation. No gross colonic mass. No colonic wall thickening. Vascular/Lymphatic: There is abdominal aortic atherosclerosis without aneurysm. Portal vein and superior mesenteric vein are patent. Bulky retroperitoneal lymphadenopathy is identified in the abdomen. Nodal conglomeration circumferentially encases the aorta just inferior to the renal arteries. This nodal mass measures 3.4 x 5.3 cm (image 58/series 2). Just proximal to the aortic bifurcation, nodal conglomeration encasing the distal aorta measures 3.5 x 3.2 cm. 1.3 cm short axis right common iliac node is visible on 77/2. 1.0 cm short axis lymph node is seen along the left common iliac chain (81/2). Lymphadenopathy is noted in the pelvic sidewalls bilaterally, right greater than left. Index 1.8 cm short axis right pelvic sidewall node is visible on 91/2. Another right pelvic sidewall lymph node measures 2.4 cm short axis on 95/2. Reproductive: Uterus appears enlarged and heterogeneous, measuring 14.2 x 7.3 x 7.0 cm. Cervix is prominent heterogeneous with apparent wall thickening in the upper vagina. Other: Small volume free fluid noted adjacent to the  liver and in the cul-de-sac. 3.3 x 4.9 cm irregular soft tissue lesion is identified to the left of the bladder on 102/2. Nodularity in the cul-de-sac is suspicious for peritoneal disease. Musculoskeletal: 2.2 cm mixed lytic and lucent lesion is identified in the left sacrum with tiny sclerotic foci evident in the left iliac bone. IMPRESSION: 1. Heterogeneous enlargement of the uterus and cervix with apparent soft tissue thickening in the upper vagina. Cervical and vaginal tissues not well evaluated by CT. 2. Bulky retroperitoneal lymphadenopathy in the abdomen with bilateral common iliac and pelvic sidewall lymphadenopathy in the pelvis. Imaging features consistent with metastatic disease. 3. 5 cm soft tissue lesion to the left of the bladder is consistent with a metastatic deposit. 4. Mild to moderate right hydroureteronephrosis with decreased perfusion to the right kidney. Right ureteral obstruction is at the level of the right pelvic sidewall. 5. 2.2 cm mixed lytic and lucent lesion in the left sacrum, indeterminate, but metastatic disease not excluded. 6. Several 3-4 mm nodules identified in the lungs. Close attention on follow-up recommended as metastatic disease not excluded. 7. Small volume ascites. Electronically Signed   By: Misty Stanley M.D.   On: 05/01/2019 16:38   Nm Pet Image Initial (pi) Skull Base To Thigh  Result Date: 05/07/2019 CLINICAL DATA:  Initial treatment strategy for cervical cancer. EXAM: NUCLEAR MEDICINE PET SKULL BASE TO THIGH TECHNIQUE: 6.5 mCi F-18 FDG was injected intravenously. Full-ring PET imaging was performed from the skull base to thigh after the radiotracer. CT data was obtained and used for attenuation correction and anatomic localization. Fasting blood glucose: 94 mg/dl COMPARISON:  05/01/2019 CT chest, abdomen and pelvis. FINDINGS: Mediastinal blood pool activity: SUV max 2.4 Liver activity: SUV max NA NECK: No hypermetabolic lymph nodes in the neck. Incidental CT  findings: none CHEST: No hypermetabolic axillary, mediastinal or hilar lymph nodes. No hypermetabolic pulmonary findings. Incidental CT findings: No acute consolidative airspace disease or lung masses. A few scattered small solid pulmonary nodules in both lungs, largest 4 mm in the medial basilar left lower lobe (series 3/image 119), below PET resolution. ABDOMEN/PELVIS: Hypermetabolic bilateral retrocrural lymphadenopathy. Representative 1.0 cm left retrocrural node with max SUV 7.0 (series 3/image 127). Multiple enlarged hypermetabolic left para-aortic and aortocaval nodes. Representative 2.9 cm left para-aortic node with max SUV 12.8 (series 3/image 152). Representative 1.9  cm aortocaval node with max SUV 9.7 (series 3/image 158). Hypermetabolic bilateral common iliac, right external iliac and right internal iliac lymphadenopathy. Representative 2.1 cm left common iliac node with max SUV 12.3 (series 3/image 186). Representative 1.8 cm right external iliac node with max SUV 16.5 (series 3/image 212). Representative 1.3 cm right internal iliac node with max SUV 13.1 (series 3/image 202). Hypermetabolic mass in the cervix with max SUV 19.1, poorly delineated on the noncontrast CT images. Hypermetabolism throughout the enlarged lower uterine segment and body of the uterus compatible with malignant involvement of the uterus with max SUV 14.7. Small foci of hypermetabolism at the periphery of the tampon within the anterior vagina without mass correlate on the CT images, favor urinary contamination. Hypermetabolic 6.1 x 3.4 cm soft tissue implant in the anterior left pelvis (series 3/image 216) surrounded by small volume pelvic ascites with max SUV 6.5 (series 3/image 216). No abnormal hypermetabolic activity within the liver, pancreas, adrenal glands, or spleen. Incidental CT findings: Mildly atherosclerotic nonaneurysmal abdominal aorta. Mild right hydroureteronephrosis to the level of the upper pelvic right ureter.  SKELETON: No focal hypermetabolic activity to suggest skeletal metastasis. No hypermetabolism associated with a mixed lytic and sclerotic upper left sacral lesion on series 3/image 192. Incidental CT findings: none IMPRESSION: 1. Hypermetabolic mass in the cervix compatible with known primary cervical malignancy. Hypermetabolism throughout the enlarged lower uterine segment and body of the uterus compatible with malignant involvement of the uterus. 2. Hypermetabolic right internal and external iliac, bilateral common iliac, aortocaval, left para-aortic and bilateral retrocrural lymphadenopathy compatible with metastatic nodal disease. 3. Hypermetabolic soft tissue implant in the anterior left pelvis surrounded by small volume pelvic ascites, favor pelvic peritoneal metastasis. 4. No hypermetabolic osseous metastatic disease. No hypermetabolism associated with the mixed lytic and sclerotic upper left sacral lesion, which is probably benign. 5. Tiny bilateral scattered solid pulmonary nodules, below PET resolution, indeterminate, recommend attention on close chest CT follow-up in 3 months. 6.  Aortic Atherosclerosis (ICD10-I70.0). Electronically Signed   By: Ilona Sorrel M.D.   On: 05/07/2019 16:02     Assessment and plan- Patient is a 53 y.o. female who presents to [facility] for initial meeting in preparation for starting chemotherapy for the treatment of weekly cisplatin with concurrent radiation that begins today 05/17/2019.   1. Cancer-stage III cervical cancer.  Patient initially presented with back pain and abnormal uterine bleeding prompting imaging and biopsy which revealed cervical cancer.  PET scan on 05/07/2019 reported with extensive nodal disease as well as mass effect causing possible hydronephrosis.  Plan is for concurrent radiation along with weekly cisplatin followed by brachii [facility] at [facility].  If residual disease status post brachii therapy, plan is for systemic treatment with carbo/Taxol  or cisplatin/Taxol plus or minus bevacizumab.  Scheduled for first cycle of weekly cisplatin today.  2. Chemo Care Clinic/High Risk for ER/Hospitalization during chemotherapy- We discussed the role of the chemo care clinic and identified patient specific risk factors. I discussed that patient was identified as high risk primarily based on: Stage of cancer and possible hydronephrosis that was identified on CT scan.  Labs are within normal limits.   Current PCP-Jolene Deri Fuelling, NP Hospital Admissions- 0 ED Visits-visit on 04/06/2019 resulting in her diagnosis.  3. Social Determinants of Health- we discussed that social determinants of health may have significant impacts on health and outcomes for cancer patients.  Today we discussed specific social determinants of performance status, alcohol use, depression, financial needs, food insecurity,  housing, interpersonal violence, social connections, stress, tobacco use, and transportation.  After lengthy discussion the following were identified as areas of need:   Based on financial insecurity: Estefania lives alone but her son lives close by.  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  She is not retired but has been encouraged to stay at home given current COVID-19 pandemic and diagnosis.  FMLA including short-term and long-term disability paperwork has been filled out and sent to appropriate individuals.  We discussed that living with cancer can create tremendous financial burden.  We discussed options for assistance. I asked that if assistance is needed in affording medications or paying bills to please let [place]now so that we can provide assistance.  Based on food insecurity-we discussed options for food including social services.  We will also notify [facility]ab[facility] Listercrater to see if cancer center can provide support.  Patient informed of food pantry at cancer center and was provided with care package today.  Please notify nursing if un-met needs.    Based on concern of stress-we discussed options for managing stress including healthy eating, exercise as well as participating in no charge counseling services at the cancer center and support groups.  If these are of interest, patient can notify either myself or primary nursing team.  Based on transportation need: She drives herself and has support from her son should she need it. We discussed options for transportation including [employer]a, paratransit, bus routes, link transit, taxi/uber/lyft, and cancer center van.  I have notified primary oncology team who will help assist with arranging vLucianne Leitransportation for appointments when needed. We also discussed options for transportation on short notice/acute visits.   4. Co-morbidities Complicating Care: None.  Patient has minimal pain in bilateral hips and suprapubic area.  Previously on Robaxin and Mobic.  Recently taken off Mobic due to concerns of bleeding and switched to tramadol 50 mg every 6 hours as needed.  5. Palliative Care- based on stage of cancer and/or identified needs today, I will refer patient to palliative care for goals of care and advanced care planning.  This is scheduled for 05/24/2019.  We also [facility] and methods of contacting clinic/provider. She denies needing specific assistance at this time and She will be followed by , KMariea ClontsRN (Nurse Navigator).   Visit Diagnosis 1. Malignant neoplasm of cervix, unspecified site ([facility]     Patient expressed understanding and was in agreement with this plan. She also understands that She can call clinic at any time with any questions, concerns, or complaints.   A total of (25) minutes of face-to-face time was spent with this patient with greater than 50% of that time in counseling and care-coordination.  JFaythe Casa NP, AGNP-C [place](work cell) [phone] (office)  CC: Dr. FGrayland Ormond

## 2019-05-17 NOTE — Progress Notes (Signed)
Request for PDL-1 and MSI sent to [facility] Pathology for [id number].

## 2019-05-18 ENCOUNTER — Other Ambulatory Visit: Payer: Self-pay

## 2019-05-18 ENCOUNTER — Ambulatory Visit
Admission: RE | Admit: 2019-05-18 | Discharge: 2019-05-18 | Disposition: A | Discharge status: Home / Self Care | Payer: BC Managed Care – PPO | Source: Ambulatory Visit | Attending: Radiation Oncology | Admitting: Radiation Oncology

## 2019-05-18 ENCOUNTER — Telehealth: Payer: Self-pay

## 2019-05-18 DIAGNOSIS — D649 Anemia, unspecified: Secondary | ICD-10-CM | POA: Diagnosis not present

## 2019-05-18 DIAGNOSIS — Z510 Encounter for antineoplastic radiation therapy: Secondary | ICD-10-CM | POA: Diagnosis not present

## 2019-05-18 DIAGNOSIS — C530 Malignant neoplasm of endocervix: Secondary | ICD-10-CM | POA: Diagnosis not present

## 2019-05-18 DIAGNOSIS — C539 Malignant neoplasm of cervix uteri, unspecified: Secondary | ICD-10-CM | POA: Diagnosis not present

## 2019-05-18 DIAGNOSIS — C772 Secondary and unspecified malignant neoplasm of intra-abdominal lymph nodes: Secondary | ICD-10-CM | POA: Diagnosis not present

## 2019-05-18 DIAGNOSIS — Z5111 Encounter for antineoplastic chemotherapy: Secondary | ICD-10-CM | POA: Diagnosis not present

## 2019-05-18 DIAGNOSIS — N289 Disorder of kidney and ureter, unspecified: Secondary | ICD-10-CM | POA: Diagnosis not present

## 2019-05-18 NOTE — Telephone Encounter (Signed)
Discussed with IR who recommends repeating cervical biopsy. Based on review of imaging, they recommend endocervical biopsy at 9:00. Discussed with Dr. Fransisca Connors who advises that it may be difficult to get enough tissue as a cervical biopsy and that if she fails primary chemo-radiation then he recommends ct directed biopsy.

## 2019-05-18 NOTE — Telephone Encounter (Signed)
Received call from [place], at [facility], Pathology. There is insufficient tissue on block to run PDL-1 or MSI.

## 2019-05-18 NOTE — Telephone Encounter (Signed)
Ok, thanks.

## 2019-05-18 NOTE — Telephone Encounter (Signed)
error 

## 2019-05-21 ENCOUNTER — Ambulatory Visit
Admission: RE | Admit: 2019-05-21 | Discharge: 2019-05-21 | Disposition: A | Discharge status: Home / Self Care | Payer: BC Managed Care – PPO | Source: Ambulatory Visit | Attending: Radiation Oncology | Admitting: Radiation Oncology

## 2019-05-21 ENCOUNTER — Other Ambulatory Visit: Payer: Self-pay

## 2019-05-21 DIAGNOSIS — C539 Malignant neoplasm of cervix uteri, unspecified: Secondary | ICD-10-CM | POA: Diagnosis not present

## 2019-05-21 DIAGNOSIS — N289 Disorder of kidney and ureter, unspecified: Secondary | ICD-10-CM | POA: Diagnosis not present

## 2019-05-21 DIAGNOSIS — D649 Anemia, unspecified: Secondary | ICD-10-CM | POA: Diagnosis not present

## 2019-05-21 DIAGNOSIS — Z510 Encounter for antineoplastic radiation therapy: Secondary | ICD-10-CM | POA: Diagnosis not present

## 2019-05-21 DIAGNOSIS — C772 Secondary and unspecified malignant neoplasm of intra-abdominal lymph nodes: Secondary | ICD-10-CM | POA: Diagnosis not present

## 2019-05-21 DIAGNOSIS — C530 Malignant neoplasm of endocervix: Secondary | ICD-10-CM | POA: Diagnosis not present

## 2019-05-21 DIAGNOSIS — Z5111 Encounter for antineoplastic chemotherapy: Secondary | ICD-10-CM | POA: Diagnosis not present

## 2019-05-22 ENCOUNTER — Ambulatory Visit
Admission: RE | Admit: 2019-05-22 | Discharge: 2019-05-22 | Disposition: A | Discharge status: Home / Self Care | Payer: BC Managed Care – PPO | Source: Ambulatory Visit | Attending: Radiation Oncology | Admitting: Radiation Oncology

## 2019-05-22 ENCOUNTER — Other Ambulatory Visit: Payer: Self-pay

## 2019-05-22 DIAGNOSIS — Z510 Encounter for antineoplastic radiation therapy: Secondary | ICD-10-CM | POA: Diagnosis not present

## 2019-05-22 DIAGNOSIS — C530 Malignant neoplasm of endocervix: Secondary | ICD-10-CM | POA: Diagnosis not present

## 2019-05-22 DIAGNOSIS — C539 Malignant neoplasm of cervix uteri, unspecified: Secondary | ICD-10-CM | POA: Diagnosis not present

## 2019-05-22 DIAGNOSIS — Z5111 Encounter for antineoplastic chemotherapy: Secondary | ICD-10-CM | POA: Diagnosis not present

## 2019-05-22 DIAGNOSIS — D649 Anemia, unspecified: Secondary | ICD-10-CM | POA: Diagnosis not present

## 2019-05-22 DIAGNOSIS — C772 Secondary and unspecified malignant neoplasm of intra-abdominal lymph nodes: Secondary | ICD-10-CM | POA: Diagnosis not present

## 2019-05-22 DIAGNOSIS — N289 Disorder of kidney and ureter, unspecified: Secondary | ICD-10-CM | POA: Diagnosis not present

## 2019-05-23 ENCOUNTER — Other Ambulatory Visit: Payer: Self-pay

## 2019-05-23 ENCOUNTER — Ambulatory Visit
Admission: RE | Admit: 2019-05-23 | Discharge: 2019-05-23 | Disposition: A | Discharge status: Home / Self Care | Payer: BC Managed Care – PPO | Source: Ambulatory Visit | Attending: Radiation Oncology | Admitting: Radiation Oncology

## 2019-05-23 ENCOUNTER — Telehealth: Payer: Self-pay

## 2019-05-23 DIAGNOSIS — N289 Disorder of kidney and ureter, unspecified: Secondary | ICD-10-CM | POA: Diagnosis not present

## 2019-05-23 DIAGNOSIS — D649 Anemia, unspecified: Secondary | ICD-10-CM | POA: Diagnosis not present

## 2019-05-23 DIAGNOSIS — C539 Malignant neoplasm of cervix uteri, unspecified: Secondary | ICD-10-CM | POA: Diagnosis not present

## 2019-05-23 DIAGNOSIS — C530 Malignant neoplasm of endocervix: Secondary | ICD-10-CM | POA: Diagnosis not present

## 2019-05-23 DIAGNOSIS — Z510 Encounter for antineoplastic radiation therapy: Secondary | ICD-10-CM | POA: Diagnosis not present

## 2019-05-23 DIAGNOSIS — Z5111 Encounter for antineoplastic chemotherapy: Secondary | ICD-10-CM | POA: Diagnosis not present

## 2019-05-23 DIAGNOSIS — C772 Secondary and unspecified malignant neoplasm of intra-abdominal lymph nodes: Secondary | ICD-10-CM | POA: Diagnosis not present

## 2019-05-23 NOTE — Telephone Encounter (Signed)
Patient's FMLA form was filled out and faxed.

## 2019-05-24 ENCOUNTER — Inpatient Hospital Stay: Payer: BC Managed Care – PPO

## 2019-05-24 ENCOUNTER — Other Ambulatory Visit: Payer: Self-pay

## 2019-05-24 ENCOUNTER — Inpatient Hospital Stay (HOSPITAL_BASED_OUTPATIENT_CLINIC_OR_DEPARTMENT_OTHER): Payer: BC Managed Care – PPO | Admitting: Hospice and Palliative Medicine

## 2019-05-24 ENCOUNTER — Encounter: Payer: Self-pay | Admitting: Oncology

## 2019-05-24 ENCOUNTER — Inpatient Hospital Stay (HOSPITAL_BASED_OUTPATIENT_CLINIC_OR_DEPARTMENT_OTHER): Payer: BC Managed Care – PPO | Admitting: Oncology

## 2019-05-24 ENCOUNTER — Ambulatory Visit
Admission: RE | Admit: 2019-05-24 | Discharge: 2019-05-24 | Disposition: A | Discharge status: Home / Self Care | Payer: BC Managed Care – PPO | Source: Ambulatory Visit | Attending: Radiation Oncology | Admitting: Radiation Oncology

## 2019-05-24 VITALS — BP 150/88 | HR 87 | Temp 98.5°F | Ht 61.0 in | Wt 117.0 lb

## 2019-05-24 DIAGNOSIS — N131 Hydronephrosis with ureteral stricture, not elsewhere classified: Secondary | ICD-10-CM

## 2019-05-24 DIAGNOSIS — Z515 Encounter for palliative care: Secondary | ICD-10-CM

## 2019-05-24 DIAGNOSIS — C539 Malignant neoplasm of cervix uteri, unspecified: Secondary | ICD-10-CM

## 2019-05-24 DIAGNOSIS — G893 Neoplasm related pain (acute) (chronic): Secondary | ICD-10-CM

## 2019-05-24 DIAGNOSIS — C530 Malignant neoplasm of endocervix: Secondary | ICD-10-CM | POA: Diagnosis not present

## 2019-05-24 DIAGNOSIS — D649 Anemia, unspecified: Secondary | ICD-10-CM | POA: Diagnosis not present

## 2019-05-24 DIAGNOSIS — Z510 Encounter for antineoplastic radiation therapy: Secondary | ICD-10-CM | POA: Diagnosis not present

## 2019-05-24 DIAGNOSIS — Z79899 Other long term (current) drug therapy: Secondary | ICD-10-CM

## 2019-05-24 DIAGNOSIS — C786 Secondary malignant neoplasm of retroperitoneum and peritoneum: Secondary | ICD-10-CM

## 2019-05-24 DIAGNOSIS — F17210 Nicotine dependence, cigarettes, uncomplicated: Secondary | ICD-10-CM

## 2019-05-24 DIAGNOSIS — D500 Iron deficiency anemia secondary to blood loss (chronic): Secondary | ICD-10-CM

## 2019-05-24 DIAGNOSIS — Z5111 Encounter for antineoplastic chemotherapy: Secondary | ICD-10-CM | POA: Diagnosis not present

## 2019-05-24 DIAGNOSIS — C772 Secondary and unspecified malignant neoplasm of intra-abdominal lymph nodes: Secondary | ICD-10-CM | POA: Diagnosis not present

## 2019-05-24 DIAGNOSIS — N289 Disorder of kidney and ureter, unspecified: Secondary | ICD-10-CM | POA: Diagnosis not present

## 2019-05-24 LAB — BASIC METABOLIC PANEL
Anion gap: 10 (ref 5–15)
BUN: 20 mg/dL (ref 6–20)
CO2: 25 mmol/L (ref 22–32)
Calcium: 8.7 mg/dL — ABNORMAL LOW (ref 8.9–10.3)
Chloride: 101 mmol/L (ref 98–111)
Creatinine, Ser: 1.26 mg/dL — ABNORMAL HIGH (ref 0.44–1.00)
GFR calc Af Amer: 58 mL/min — ABNORMAL LOW (ref >60)
GFR calc non Af Amer: 50 mL/min — ABNORMAL LOW (ref >60)
Glucose, Bld: 122 mg/dL — ABNORMAL HIGH (ref 70–99)
Potassium: 3.9 mmol/L (ref 3.5–5.1)
Sodium: 136 mmol/L (ref 135–145)

## 2019-05-24 LAB — CBC WITH DIFFERENTIAL/PLATELET
Abs Immature Granulocytes: 0.16 10*3/uL — ABNORMAL HIGH (ref 0.00–0.07)
Basophils Absolute: 0 10*3/uL (ref 0.0–0.1)
Basophils Relative: 0 %
Eosinophils Absolute: 0.1 10*3/uL (ref 0.0–0.5)
Eosinophils Relative: 1 %
HCT: 23.3 % — ABNORMAL LOW (ref 36.0–46.0)
Hemoglobin: 7.4 g/dL — ABNORMAL LOW (ref 12.0–15.0)
Immature Granulocytes: 2 %
Lymphocytes Relative: 6 %
Lymphs Abs: 0.5 10*3/uL — ABNORMAL LOW (ref 0.7–4.0)
MCH: 30.1 pg (ref 26.0–34.0)
MCHC: 31.8 g/dL (ref 30.0–36.0)
MCV: 94.7 fL (ref 80.0–100.0)
Monocytes Absolute: 0.7 10*3/uL (ref 0.1–1.0)
Monocytes Relative: 8 %
Neutro Abs: 7.5 10*3/uL (ref 1.7–7.7)
Neutrophils Relative %: 83 %
Platelets: 437 10*3/uL — ABNORMAL HIGH (ref 150–400)
RBC: 2.46 MIL/uL — ABNORMAL LOW (ref 3.87–5.11)
RDW: 13.5 % (ref 11.5–15.5)
WBC: 9.1 10*3/uL (ref 4.0–10.5)
nRBC: 0 % (ref 0.0–0.2)

## 2019-05-24 LAB — IRON AND TIBC
Iron: 19 ug/dL — ABNORMAL LOW (ref 28–170)
Saturation Ratios: 7 % — ABNORMAL LOW (ref 10.4–31.8)
TIBC: 262 ug/dL (ref 250–450)
UIBC: 243 ug/dL

## 2019-05-24 LAB — FERRITIN: Ferritin: 70 ng/mL (ref 11–307)

## 2019-05-24 MED ORDER — PALONOSETRON HCL INJECTION 0.25 MG/5ML
0.2500 mg | Freq: Once | INTRAVENOUS | Status: AC
  Administered 2019-05-24: 0.25 mg via INTRAVENOUS
  Filled 2019-05-24: qty 5

## 2019-05-24 MED ORDER — SODIUM CHLORIDE 0.9 % IV SOLN
Freq: Once | INTRAVENOUS | Status: AC
  Administered 2019-05-24: 11:00:00 via INTRAVENOUS
  Filled 2019-05-24: qty 250

## 2019-05-24 MED ORDER — POTASSIUM CHLORIDE 2 MEQ/ML IV SOLN
Freq: Once | INTRAVENOUS | Status: AC
  Administered 2019-05-24: 11:00:00 via INTRAVENOUS
  Filled 2019-05-24: qty 1000

## 2019-05-24 MED ORDER — SODIUM CHLORIDE 0.9 % IV SOLN
40.0000 mg/m2 | Freq: Once | INTRAVENOUS | Status: AC
  Administered 2019-05-24: 61 mg via INTRAVENOUS
  Filled 2019-05-24: qty 61

## 2019-05-24 MED ORDER — HYDROCODONE-ACETAMINOPHEN 5-325 MG PO TABS: 1.0000 | ORAL_TABLET | Freq: Four times a day (QID) | ORAL | 0 refills | Status: DC | PRN

## 2019-05-24 MED ORDER — SODIUM CHLORIDE 0.9 % IV SOLN
Freq: Once | INTRAVENOUS | Status: AC
  Administered 2019-05-24: 13:00:00 via INTRAVENOUS
  Filled 2019-05-24: qty 5

## 2019-05-24 NOTE — Progress Notes (Signed)
[place]  [phone] [phone]   Name: KEYONNA COMUNALE Date: 05/24/2019 MRN: [id number]  DOB: September 02, 1966  Patient Care Team: Venita Lick, NP as PCP - General (Nurse Practitioner) Clent Jacks, RN as Oncology Nurse Navigator    REASON FOR CONSULTATION: Palliative Care consult requested for this 53 y.o. 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  Patient is felt not to be a surgical candidate and treatment was initiated with concurrent chemoradiation.  Patient was referred to palliative care to help address goals and manage ongoing symptoms.   SOCIAL HISTORY:     reports that she has been smoking cigarettes. She has a 15.00 pack-year smoking history. She has never used smokeless tobacco. She reports previous alcohol use. She reports that she does not use drugs.  Patient is not married.  She lives at home alone.  She has a son who is involved in her care, who lives nearby.  Patient previously worked at [employer] in [place] and recently stopped working due to [employer].  ADVANCE DIRECTIVES:  Does not have  CODE STATUS:   PAST MEDICAL HISTORY: Past Medical History:  Diagnosis Date   No pertinent past medical history     PAST SURGICAL HISTORY:  Past Surgical History:  Procedure Laterality Date   MASS EXCISION     Throat   TONSILLECTOMY      HEMATOLOGY/ONCOLOGY HISTORY:  Oncology History  Cervical cancer ([place])  05/04/2019 Initial Diagnosis   Cervical cancer ([place])   05/09/2019 Cancer Staging   Staging form: Cervix Uteri, AJCC 8th Edition - Clinical: FIGO Stage IIIB (cT3b, cN1, cM0) - Signed by Lloyd Huger, MD on 05/09/2019   05/17/2019 -  Chemotherapy   The patient had palonosetron (ALOXI) injection 0.25 mg, 0.25 mg, Intravenous,  Once, 2 of 7 cycles Administration:  0.25 mg (05/17/2019) CISplatin (PLATINOL) 61 mg in sodium chloride 0.9 % 250 mL chemo infusion, 40 mg/m2 = 61 mg, Intravenous,  Once, 2 of 7 cycles Administration: 61 mg (05/17/2019) fosaprepitant (EMEND) 150 mg, dexamethasone (DECADRON) 12 mg in sodium chloride 0.9 % 145 mL IVPB, , Intravenous,  Once, 2 of 7 cycles Administration:  (05/17/2019)  for chemotherapy treatment.      ALLERGIES:  has No Known Allergies.  MEDICATIONS:  Current Outpatient Medications  Medication Sig Dispense Refill   cyclobenzaprine (FLEXERIL) 10 MG tablet Take 1 tablet by mouth 1 day or 1 dose.     meloxicam (MOBIC) 7.5 MG tablet Take 1 tablet (7.5 mg total) by mouth 2 (two) times a day. 120 tablet 1   methocarbamol (ROBAXIN) 500 MG tablet Take 1 tablet (500 mg total) by mouth 2 (two) times daily as needed for muscle spasms (for lower back pain). 120 tablet 1   ondansetron (ZOFRAN) 8 MG tablet Take 1 tablet (8 mg total) by mouth 2 (two) times daily as needed. Start on the third day after chemotherapy. 30 tablet 2   prochlorperazine (COMPAZINE) 10 MG tablet Take 1 tablet (10 mg total) by mouth every 6 (six) hours as needed (Nausea or vomiting). 60 tablet 2   traMADol (ULTRAM) 50 MG tablet Take 1 tablet (50 mg total) by mouth every 6 (six) hours as needed. 30 tablet 0   No current facility-administered medications for this visit.    Facility-Administered Medications Ordered in Other Visits  Medication Dose Route Frequency Provider  Last Rate Last Dose   dextrose 5 % and 0.45% NaCl 1,000 mL with potassium chloride 20 mEq, magnesium sulfate 12 mEq infusion   Intravenous Once Lloyd Huger, MD   Stopped at 05/24/19 1513    VITAL SIGNS: There were no vitals taken for this visit. There were no vitals filed for this visit.  Estimated body mass index is 22.11 kg/m as calculated from the following:   Height as of an earlier encounter on 05/24/19: '5\' 1"'$  (1.549 m).   Weight as of an earlier encounter on 05/24/19:  117 lb (53.1 kg).  LABS: CBC:    Component Value Date/Time   WBC 9.1 05/24/2019 0842   HGB 7.4 (L) 05/24/2019 0842   HGB 14.5 04/09/2019 0918   HCT 23.3 (L) 05/24/2019 0842   HCT 44.2 04/09/2019 0918   PLT 437 (H) 05/24/2019 0842   PLT 401 04/09/2019 0918   MCV 94.7 05/24/2019 0842   MCV 99 (H) 04/09/2019 0918   NEUTROABS 7.5 05/24/2019 0842   NEUTROABS 5.9 04/09/2019 0918   LYMPHSABS 0.5 (L) 05/24/2019 0842   LYMPHSABS 1.3 04/09/2019 0918   MONOABS 0.7 05/24/2019 0842   EOSABS 0.1 05/24/2019 0842   EOSABS 0.2 04/09/2019 0918   BASOSABS 0.0 05/24/2019 0842   BASOSABS 0.1 04/09/2019 0918   Comprehensive Metabolic Panel:    Component Value Date/Time   NA 136 05/24/2019 0842   NA 138 04/09/2019 0918   K 3.9 05/24/2019 0842   CL 101 05/24/2019 0842   CO2 25 05/24/2019 0842   BUN 20 05/24/2019 0842   BUN 12 04/09/2019 0918   CREATININE 1.26 (H) 05/24/2019 0842   GLUCOSE 122 (H) 05/24/2019 0842   CALCIUM 8.7 (L) 05/24/2019 0842   AST 24 04/09/2019 0918   ALT 6 04/09/2019 0918   ALKPHOS 79 04/09/2019 0918   BILITOT 0.4 04/09/2019 0918   PROT 6.8 04/09/2019 0918   ALBUMIN 4.0 04/09/2019 0918    RADIOGRAPHIC STUDIES: Ct Chest W Contrast  Result Date: 05/01/2019 CLINICAL DATA:  Abnormal Pap smear.  Cervical neoplasm.  Staging. EXAM: CT CHEST, ABDOMEN, AND PELVIS WITH CONTRAST TECHNIQUE: Multidetector CT imaging of the chest, abdomen and pelvis was performed following the standard protocol during bolus administration of intravenous contrast. CONTRAST:  49m OMNIPAQUE IOHEXOL 300 MG/ML  SOLN COMPARISON:  None. FINDINGS: CT CHEST FINDINGS Cardiovascular: The heart size is normal. No substantial pericardial effusion. No thoracic aortic aneurysm. Mediastinum/Nodes: No mediastinal lymphadenopathy. There is no hilar lymphadenopathy. Small lymph nodes are seen in the axillary regions bilaterally. The esophagus has normal imaging features. Lungs/Pleura: Scattered tiny centrilobular  ground-glass nodules are seen in the lungs bilaterally with an upper lung predominance. Imaging features compatible with smoking related lung disease (respiratory bronchiolitis-associated intersitial lung disease). 3 mm right upper lobe solid nodule is visible on image 54/series 4. 3 mm right middle lobe nodule is visible on 76/4. 4 mm left lower lobe nodule identified on 101/4. No focal airspace consolidation no pleural effusion. Musculoskeletal: No worrisome lytic or sclerotic osseous abnormality. CT ABDOMEN PELVIS FINDINGS Hepatobiliary: No suspicious focal abnormality within the liver parenchyma. There is no evidence for gallstones, gallbladder wall thickening, or pericholecystic fluid. No intrahepatic or extrahepatic biliary dilation. Pancreas: No focal mass lesion. No dilatation of the main duct. No intraparenchymal cyst. No peripancreatic edema. Spleen: No splenomegaly. No focal mass lesion. Adrenals/Urinary Tract: No adrenal nodule or mass. Mild to moderate right hydroureteronephrosis identified with right ureteral dilatation extending down to the right pelvic sidewall. Decreased  perfusion to the right kidney is compatible with obstructive uropathy. Left kidney and ureter unremarkable. Bladder is nondistended. Stomach/Bowel: Stomach is unremarkable. No gastric wall thickening. No evidence of outlet obstruction. Duodenum is normally positioned as is the ligament of Treitz. No small bowel wall thickening. No small bowel dilatation. No gross colonic mass. No colonic wall thickening. Vascular/Lymphatic: There is abdominal aortic atherosclerosis without aneurysm. Portal vein and superior mesenteric vein are patent. Bulky retroperitoneal lymphadenopathy is identified in the abdomen. Nodal conglomeration circumferentially encases the aorta just inferior to the renal arteries. This nodal mass measures 3.4 x 5.3 cm (image 58/series 2). Just proximal to the aortic bifurcation, nodal conglomeration encasing the distal  aorta measures 3.5 x 3.2 cm. 1.3 cm short axis right common iliac node is visible on 77/2. 1.0 cm short axis lymph node is seen along the left common iliac chain (81/2). Lymphadenopathy is noted in the pelvic sidewalls bilaterally, right greater than left. Index 1.8 cm short axis right pelvic sidewall node is visible on 91/2. Another right pelvic sidewall lymph node measures 2.4 cm short axis on 95/2. Reproductive: Uterus appears enlarged and heterogeneous, measuring 14.2 x 7.3 x 7.0 cm. Cervix is prominent heterogeneous with apparent wall thickening in the upper vagina. Other: Small volume free fluid noted adjacent to the liver and in the cul-de-sac. 3.3 x 4.9 cm irregular soft tissue lesion is identified to the left of the bladder on 102/2. Nodularity in the cul-de-sac is suspicious for peritoneal disease. Musculoskeletal: 2.2 cm mixed lytic and lucent lesion is identified in the left sacrum with tiny sclerotic foci evident in the left iliac bone. IMPRESSION: 1. Heterogeneous enlargement of the uterus and cervix with apparent soft tissue thickening in the upper vagina. Cervical and vaginal tissues not well evaluated by CT. 2. Bulky retroperitoneal lymphadenopathy in the abdomen with bilateral common iliac and pelvic sidewall lymphadenopathy in the pelvis. Imaging features consistent with metastatic disease. 3. 5 cm soft tissue lesion to the left of the bladder is consistent with a metastatic deposit. 4. Mild to moderate right hydroureteronephrosis with decreased perfusion to the right kidney. Right ureteral obstruction is at the level of the right pelvic sidewall. 5. 2.2 cm mixed lytic and lucent lesion in the left sacrum, indeterminate, but metastatic disease not excluded. 6. Several 3-4 mm nodules identified in the lungs. Close attention on follow-up recommended as metastatic disease not excluded. 7. Small volume ascites. Electronically Signed   By: Misty Stanley M.D.   On: 05/01/2019 16:38   Ct Abdomen Pelvis  W Contrast  Result Date: 05/01/2019 CLINICAL DATA:  Abnormal Pap smear.  Cervical neoplasm.  Staging. EXAM: CT CHEST, ABDOMEN, AND PELVIS WITH CONTRAST TECHNIQUE: Multidetector CT imaging of the chest, abdomen and pelvis was performed following the standard protocol during bolus administration of intravenous contrast. CONTRAST:  26m OMNIPAQUE IOHEXOL 300 MG/ML  SOLN COMPARISON:  None. FINDINGS: CT CHEST FINDINGS Cardiovascular: The heart size is normal. No substantial pericardial effusion. No thoracic aortic aneurysm. Mediastinum/Nodes: No mediastinal lymphadenopathy. There is no hilar lymphadenopathy. Small lymph nodes are seen in the axillary regions bilaterally. The esophagus has normal imaging features. Lungs/Pleura: Scattered tiny centrilobular ground-glass nodules are seen in the lungs bilaterally with an upper lung predominance. Imaging features compatible with smoking related lung disease (respiratory bronchiolitis-associated intersitial lung disease). 3 mm right upper lobe solid nodule is visible on image 54/series 4. 3 mm right middle lobe nodule is visible on 76/4. 4 mm left lower lobe nodule identified on 101/4. No focal airspace  consolidation no pleural effusion. Musculoskeletal: No worrisome lytic or sclerotic osseous abnormality. CT ABDOMEN PELVIS FINDINGS Hepatobiliary: No suspicious focal abnormality within the liver parenchyma. There is no evidence for gallstones, gallbladder wall thickening, or pericholecystic fluid. No intrahepatic or extrahepatic biliary dilation. Pancreas: No focal mass lesion. No dilatation of the main duct. No intraparenchymal cyst. No peripancreatic edema. Spleen: No splenomegaly. No focal mass lesion. Adrenals/Urinary Tract: No adrenal nodule or mass. Mild to moderate right hydroureteronephrosis identified with right ureteral dilatation extending down to the right pelvic sidewall. Decreased perfusion to the right kidney is compatible with obstructive uropathy. Left  kidney and ureter unremarkable. Bladder is nondistended. Stomach/Bowel: Stomach is unremarkable. No gastric wall thickening. No evidence of outlet obstruction. Duodenum is normally positioned as is the ligament of Treitz. No small bowel wall thickening. No small bowel dilatation. No gross colonic mass. No colonic wall thickening. Vascular/Lymphatic: There is abdominal aortic atherosclerosis without aneurysm. Portal vein and superior mesenteric vein are patent. Bulky retroperitoneal lymphadenopathy is identified in the abdomen. Nodal conglomeration circumferentially encases the aorta just inferior to the renal arteries. This nodal mass measures 3.4 x 5.3 cm (image 58/series 2). Just proximal to the aortic bifurcation, nodal conglomeration encasing the distal aorta measures 3.5 x 3.2 cm. 1.3 cm short axis right common iliac node is visible on 77/2. 1.0 cm short axis lymph node is seen along the left common iliac chain (81/2). Lymphadenopathy is noted in the pelvic sidewalls bilaterally, right greater than left. Index 1.8 cm short axis right pelvic sidewall node is visible on 91/2. Another right pelvic sidewall lymph node measures 2.4 cm short axis on 95/2. Reproductive: Uterus appears enlarged and heterogeneous, measuring 14.2 x 7.3 x 7.0 cm. Cervix is prominent heterogeneous with apparent wall thickening in the upper vagina. Other: Small volume free fluid noted adjacent to the liver and in the cul-de-sac. 3.3 x 4.9 cm irregular soft tissue lesion is identified to the left of the bladder on 102/2. Nodularity in the cul-de-sac is suspicious for peritoneal disease. Musculoskeletal: 2.2 cm mixed lytic and lucent lesion is identified in the left sacrum with tiny sclerotic foci evident in the left iliac bone. IMPRESSION: 1. Heterogeneous enlargement of the uterus and cervix with apparent soft tissue thickening in the upper vagina. Cervical and vaginal tissues not well evaluated by CT. 2. Bulky retroperitoneal  lymphadenopathy in the abdomen with bilateral common iliac and pelvic sidewall lymphadenopathy in the pelvis. Imaging features consistent with metastatic disease. 3. 5 cm soft tissue lesion to the left of the bladder is consistent with a metastatic deposit. 4. Mild to moderate right hydroureteronephrosis with decreased perfusion to the right kidney. Right ureteral obstruction is at the level of the right pelvic sidewall. 5. 2.2 cm mixed lytic and lucent lesion in the left sacrum, indeterminate, but metastatic disease not excluded. 6. Several 3-4 mm nodules identified in the lungs. Close attention on follow-up recommended as metastatic disease not excluded. 7. Small volume ascites. Electronically Signed   By: Misty Stanley M.D.   On: 05/01/2019 16:38   Nm Pet Image Initial (pi) Skull Base To Thigh  Result Date: 05/07/2019 CLINICAL DATA:  Initial treatment strategy for cervical cancer. EXAM: NUCLEAR MEDICINE PET SKULL BASE TO THIGH TECHNIQUE: 6.5 mCi F-18 FDG was injected intravenously. Full-ring PET imaging was performed from the skull base to thigh after the radiotracer. CT data was obtained and used for attenuation correction and anatomic localization. Fasting blood glucose: 94 mg/dl COMPARISON:  05/01/2019 CT chest, abdomen and pelvis. FINDINGS:  Mediastinal blood pool activity: SUV max 2.4 Liver activity: SUV max NA NECK: No hypermetabolic lymph nodes in the neck. Incidental CT findings: none CHEST: No hypermetabolic axillary, mediastinal or hilar lymph nodes. No hypermetabolic pulmonary findings. Incidental CT findings: No acute consolidative airspace disease or lung masses. A few scattered small solid pulmonary nodules in both lungs, largest 4 mm in the medial basilar left lower lobe (series 3/image 119), below PET resolution. ABDOMEN/PELVIS: Hypermetabolic bilateral retrocrural lymphadenopathy. Representative 1.0 cm left retrocrural node with max SUV 7.0 (series 3/image 127). Multiple enlarged  hypermetabolic left para-aortic and aortocaval nodes. Representative 2.9 cm left para-aortic node with max SUV 12.8 (series 3/image 152). Representative 1.9 cm aortocaval node with max SUV 9.7 (series 3/image 158). Hypermetabolic bilateral common iliac, right external iliac and right internal iliac lymphadenopathy. Representative 2.1 cm left common iliac node with max SUV 12.3 (series 3/image 186). Representative 1.8 cm right external iliac node with max SUV 16.5 (series 3/image 212). Representative 1.3 cm right internal iliac node with max SUV 13.1 (series 3/image 202). Hypermetabolic mass in the cervix with max SUV 19.1, poorly delineated on the noncontrast CT images. Hypermetabolism throughout the enlarged lower uterine segment and body of the uterus compatible with malignant involvement of the uterus with max SUV 14.7. Small foci of hypermetabolism at the periphery of the tampon within the anterior vagina without mass correlate on the CT images, favor urinary contamination. Hypermetabolic 6.1 x 3.4 cm soft tissue implant in the anterior left pelvis (series 3/image 216) surrounded by small volume pelvic ascites with max SUV 6.5 (series 3/image 216). No abnormal hypermetabolic activity within the liver, pancreas, adrenal glands, or spleen. Incidental CT findings: Mildly atherosclerotic nonaneurysmal abdominal aorta. Mild right hydroureteronephrosis to the level of the upper pelvic right ureter. SKELETON: No focal hypermetabolic activity to suggest skeletal metastasis. No hypermetabolism associated with a mixed lytic and sclerotic upper left sacral lesion on series 3/image 192. Incidental CT findings: none IMPRESSION: 1. Hypermetabolic mass in the cervix compatible with known primary cervical malignancy. Hypermetabolism throughout the enlarged lower uterine segment and body of the uterus compatible with malignant involvement of the uterus. 2. Hypermetabolic right internal and external iliac, bilateral common iliac,  aortocaval, left para-aortic and bilateral retrocrural lymphadenopathy compatible with metastatic nodal disease. 3. Hypermetabolic soft tissue implant in the anterior left pelvis surrounded by small volume pelvic ascites, favor pelvic peritoneal metastasis. 4. No hypermetabolic osseous metastatic disease. No hypermetabolism associated with the mixed lytic and sclerotic upper left sacral lesion, which is probably benign. 5. Tiny bilateral scattered solid pulmonary nodules, below PET resolution, indeterminate, recommend attention on close chest CT follow-up in 3 months. 6.  Aortic Atherosclerosis (ICD10-I70.0). Electronically Signed   By: Ilona Sorrel M.D.   On: 05/07/2019 16:02    PERFORMANCE STATUS (ECOG) : 1 - Symptomatic but completely ambulatory  Review of Systems Unless otherwise noted, a complete review of systems is negative.  Physical Exam General: NAD, frail appearing, thin Pulmonary: Unlabored Extremities: no edema Skin: no rashes Neurological: Weakness but otherwise nonfocal  IMPRESSION: I met with patient today while she received her infusion.  Introduced palliative care services and attempted establish therapeutic rapport.  Patient reports doing reasonably well.  She has had fairly persistent abdominal pain but did not like the way the tramadol made her feel.  She felt the tramadol increased her bloating and nausea.  She would like to try another pain regimen.  We will discontinue tramadol and start PRN Norco.  Patient denies other distressing  symptoms.  She reports her appetite is adequate.  No weight loss noted.    She denies insomnia and feels she is coping reasonably well with her diagnosis.  Patient lives at home alone.  She has a son who is involved.  Patient is no longer working.  We discussed ACP documents and patient took home with her a MOST Form to review.  Patient verbalized an interest in planning for the possibility of a poor outcome in the future.  However, her  goals seem aligned with current treatment plan.  PLAN: -Continue current scope of treatment -DC tramadol and meloxicam (history of bleeding) -Start Norco 5-325 mg every 6 hours as needed for pain -Prophylactic bowel regimen -ACP documents and most form reviewed -RTC in 2 weeks   Patient expressed understanding and was in agreement with this plan. She also understands that She can call the clinic at any time with any questions, concerns, or complaints.     Time Total: 30 minutes  Visit consisted of counseling and education dealing with the complex and emotionally intense issues of symptom management and palliative care in the setting of serious and potentially life-threatening illness.Greater than 50%  of this time was spent counseling and coordinating care related to the above assessment and plan.  Signed by: Altha Harm, PhD, NP-C [phone] (Work Cell)

## 2019-05-24 NOTE — Progress Notes (Signed)
Patient stated that she had been doing better now. However, on Saturday and Sunday she was not feeling well. Patient had been nauseated but doing better now. Patient is currently feeling anxious.

## 2019-05-24 NOTE — Progress Notes (Signed)
[place]  [phone] [phone]  ID: Mariah Mcmahon OB: Feb 09, 1966  MR#: [id number]  MWN#:[id number]  Patient Care Team: Venita Lick, NP as PCP - General (Nurse Practitioner) Clent Jacks, RN as Oncology Nurse Navigator  CHIEF COMPLAINT: Stage IIIb cervical cancer  INTERVAL HISTORY: Patient returns to clinic today for further evaluation and consideration of cycle 2 of weekly cisplatin.  She tolerated her first infusion well without significant side effects.  She continues to have mild abdominal pain and bloating, but her abnormal vaginal bleeding has improved.  She continues to be anxious.  She has no neurologic complaints.  She denies any recent fevers or illnesses.  She has a good appetite and denies weight loss.  She has no chest pain, shortness of breath, cough, or hemoptysis.  She denies any nausea, vomiting, constipation, or diarrhea.  She has no urinary complaints.  Patient offers no further specific complaints today.  REVIEW OF SYSTEMS:   Review of Systems  Constitutional: Negative.  Negative for fever, malaise/fatigue and weight loss.  Respiratory: Negative.  Negative for cough and shortness of breath.   Cardiovascular: Negative.  Negative for chest pain and leg swelling.  Gastrointestinal: Positive for abdominal pain.  Genitourinary: Negative.  Negative for hematuria.       Abnormal uterine bleeding.  Musculoskeletal: Negative.  Negative for back pain.  Skin: Negative.  Negative for rash.  Neurological: Negative.  Negative for dizziness, focal weakness, weakness and headaches.  Psychiatric/Behavioral: The patient is nervous/anxious.     As per HPI. Otherwise, a complete review of systems is negative.  PAST MEDICAL HISTORY: Past Medical History:  Diagnosis Date   No pertinent past medical history     PAST SURGICAL HISTORY: Past Surgical History:  Procedure Laterality Date   MASS EXCISION     Throat    TONSILLECTOMY      FAMILY HISTORY: Family History  Problem Relation Age of Onset   Brain cancer Mother 35   Alcohol abuse Father    Heart disease Father    Prostate cancer Father    Heart attack Father    Diabetes Father     ADVANCED DIRECTIVES (Y/N):  N  HEALTH MAINTENANCE: Social History   Tobacco Use   Smoking status: Current Every Day Smoker    Packs/day: 0.50    Years: 30.00    Pack years: 15.00    Types: Cigarettes   Smokeless tobacco: Never Used  Substance Use Topics   Alcohol use: Not Currently   Drug use: Never     Colonoscopy:  PAP:  Bone density:  Lipid panel:  No Known Allergies  Current Outpatient Medications  Medication Sig Dispense Refill   cyclobenzaprine (FLEXERIL) 10 MG tablet Take 1 tablet by mouth 1 day or 1 dose.     methocarbamol (ROBAXIN) 500 MG tablet Take 1 tablet (500 mg total) by mouth 2 (two) times daily as needed for muscle spasms (for lower back pain). 120 tablet 1   ondansetron (ZOFRAN) 8 MG tablet Take 1 tablet (8 mg total) by mouth 2 (two) times daily as needed. Start on the third day after chemotherapy. 30 tablet 2   prochlorperazine (COMPAZINE) 10 MG tablet Take 1 tablet (10 mg total) by mouth every 6 (six) hours as needed (Nausea or vomiting). 60 tablet 2   HYDROcodone-acetaminophen (NORCO) 5-325 MG tablet Take 1 tablet by mouth every 6 (six) hours as needed for moderate pain. 45 tablet 0   No current facility-administered medications  for this visit.     OBJECTIVE: Vitals:   05/24/19 0933  BP: (!) 150/88  Pulse: 87  Temp: 98.5 F (36.9 C)     Body mass index is 22.11 kg/m.    ECOG FS:0 - Asymptomatic  General: Well-developed, well-nourished, no acute distress. Eyes: Pink conjunctiva, anicteric sclera. HEENT: Normocephalic, moist mucous membranes. Lungs: Clear to auscultation bilaterally. Heart: Regular rate and rhythm. No rubs, murmurs, or gallops. Abdomen: Soft, nontender, nondistended. No organomegaly  noted, normoactive bowel sounds. Musculoskeletal: No edema, cyanosis, or clubbing. Neuro: Alert, answering all questions appropriately. Cranial nerves grossly intact. Skin: No rashes or petechiae noted. Psych: Normal affect.  LAB RESULTS:  Lab Results  Component Value Date   NA 136 05/24/2019   K 3.9 05/24/2019   CL 101 05/24/2019   CO2 25 05/24/2019   GLUCOSE 122 (H) 05/24/2019   BUN 20 05/24/2019   CREATININE 1.26 (H) 05/24/2019   CALCIUM 8.7 (L) 05/24/2019   PROT 6.8 04/09/2019   ALBUMIN 4.0 04/09/2019   AST 24 04/09/2019   ALT 6 04/09/2019   ALKPHOS 79 04/09/2019   BILITOT 0.4 04/09/2019   GFRNONAA 50 (L) 05/24/2019   GFRAA 58 (L) 05/24/2019    Lab Results  Component Value Date   WBC 9.1 05/24/2019   NEUTROABS 7.5 05/24/2019   HGB 7.4 (L) 05/24/2019   HCT 23.3 (L) 05/24/2019   MCV 94.7 05/24/2019   PLT 437 (H) 05/24/2019     STUDIES: Ct Chest W Contrast  Result Date: 05/01/2019 CLINICAL DATA:  Abnormal Pap smear.  Cervical neoplasm.  Staging. EXAM: CT CHEST, ABDOMEN, AND PELVIS WITH CONTRAST TECHNIQUE: Multidetector CT imaging of the chest, abdomen and pelvis was performed following the standard protocol during bolus administration of intravenous contrast. CONTRAST:  78m OMNIPAQUE IOHEXOL 300 MG/ML  SOLN COMPARISON:  None. FINDINGS: CT CHEST FINDINGS Cardiovascular: The heart size is normal. No substantial pericardial effusion. No thoracic aortic aneurysm. Mediastinum/Nodes: No mediastinal lymphadenopathy. There is no hilar lymphadenopathy. Small lymph nodes are seen in the axillary regions bilaterally. The esophagus has normal imaging features. Lungs/Pleura: Scattered tiny centrilobular ground-glass nodules are seen in the lungs bilaterally with an upper lung predominance. Imaging features compatible with smoking related lung disease (respiratory bronchiolitis-associated intersitial lung disease). 3 mm right upper lobe solid nodule is visible on image 54/series 4. 3 mm  right middle lobe nodule is visible on 76/4. 4 mm left lower lobe nodule identified on 101/4. No focal airspace consolidation no pleural effusion. Musculoskeletal: No worrisome lytic or sclerotic osseous abnormality. CT ABDOMEN PELVIS FINDINGS Hepatobiliary: No suspicious focal abnormality within the liver parenchyma. There is no evidence for gallstones, gallbladder wall thickening, or pericholecystic fluid. No intrahepatic or extrahepatic biliary dilation. Pancreas: No focal mass lesion. No dilatation of the main duct. No intraparenchymal cyst. No peripancreatic edema. Spleen: No splenomegaly. No focal mass lesion. Adrenals/Urinary Tract: No adrenal nodule or mass. Mild to moderate right hydroureteronephrosis identified with right ureteral dilatation extending down to the right pelvic sidewall. Decreased perfusion to the right kidney is compatible with obstructive uropathy. Left kidney and ureter unremarkable. Bladder is nondistended. Stomach/Bowel: Stomach is unremarkable. No gastric wall thickening. No evidence of outlet obstruction. Duodenum is normally positioned as is the ligament of Treitz. No small bowel wall thickening. No small bowel dilatation. No gross colonic mass. No colonic wall thickening. Vascular/Lymphatic: There is abdominal aortic atherosclerosis without aneurysm. Portal vein and superior mesenteric vein are patent. Bulky retroperitoneal lymphadenopathy is identified in the abdomen. Nodal conglomeration circumferentially  encases the aorta just inferior to the renal arteries. This nodal mass measures 3.4 x 5.3 cm (image 58/series 2). Just proximal to the aortic bifurcation, nodal conglomeration encasing the distal aorta measures 3.5 x 3.2 cm. 1.3 cm short axis right common iliac node is visible on 77/2. 1.0 cm short axis lymph node is seen along the left common iliac chain (81/2). Lymphadenopathy is noted in the pelvic sidewalls bilaterally, right greater than left. Index 1.8 cm short axis right  pelvic sidewall node is visible on 91/2. Another right pelvic sidewall lymph node measures 2.4 cm short axis on 95/2. Reproductive: Uterus appears enlarged and heterogeneous, measuring 14.2 x 7.3 x 7.0 cm. Cervix is prominent heterogeneous with apparent wall thickening in the upper vagina. Other: Small volume free fluid noted adjacent to the liver and in the cul-de-sac. 3.3 x 4.9 cm irregular soft tissue lesion is identified to the left of the bladder on 102/2. Nodularity in the cul-de-sac is suspicious for peritoneal disease. Musculoskeletal: 2.2 cm mixed lytic and lucent lesion is identified in the left sacrum with tiny sclerotic foci evident in the left iliac bone. IMPRESSION: 1. Heterogeneous enlargement of the uterus and cervix with apparent soft tissue thickening in the upper vagina. Cervical and vaginal tissues not well evaluated by CT. 2. Bulky retroperitoneal lymphadenopathy in the abdomen with bilateral common iliac and pelvic sidewall lymphadenopathy in the pelvis. Imaging features consistent with metastatic disease. 3. 5 cm soft tissue lesion to the left of the bladder is consistent with a metastatic deposit. 4. Mild to moderate right hydroureteronephrosis with decreased perfusion to the right kidney. Right ureteral obstruction is at the level of the right pelvic sidewall. 5. 2.2 cm mixed lytic and lucent lesion in the left sacrum, indeterminate, but metastatic disease not excluded. 6. Several 3-4 mm nodules identified in the lungs. Close attention on follow-up recommended as metastatic disease not excluded. 7. Small volume ascites. Electronically Signed   By: Misty Stanley M.D.   On: 05/01/2019 16:38   Ct Abdomen Pelvis W Contrast  Result Date: 05/01/2019 CLINICAL DATA:  Abnormal Pap smear.  Cervical neoplasm.  Staging. EXAM: CT CHEST, ABDOMEN, AND PELVIS WITH CONTRAST TECHNIQUE: Multidetector CT imaging of the chest, abdomen and pelvis was performed following the standard protocol during bolus  administration of intravenous contrast. CONTRAST:  24m OMNIPAQUE IOHEXOL 300 MG/ML  SOLN COMPARISON:  None. FINDINGS: CT CHEST FINDINGS Cardiovascular: The heart size is normal. No substantial pericardial effusion. No thoracic aortic aneurysm. Mediastinum/Nodes: No mediastinal lymphadenopathy. There is no hilar lymphadenopathy. Small lymph nodes are seen in the axillary regions bilaterally. The esophagus has normal imaging features. Lungs/Pleura: Scattered tiny centrilobular ground-glass nodules are seen in the lungs bilaterally with an upper lung predominance. Imaging features compatible with smoking related lung disease (respiratory bronchiolitis-associated intersitial lung disease). 3 mm right upper lobe solid nodule is visible on image 54/series 4. 3 mm right middle lobe nodule is visible on 76/4. 4 mm left lower lobe nodule identified on 101/4. No focal airspace consolidation no pleural effusion. Musculoskeletal: No worrisome lytic or sclerotic osseous abnormality. CT ABDOMEN PELVIS FINDINGS Hepatobiliary: No suspicious focal abnormality within the liver parenchyma. There is no evidence for gallstones, gallbladder wall thickening, or pericholecystic fluid. No intrahepatic or extrahepatic biliary dilation. Pancreas: No focal mass lesion. No dilatation of the main duct. No intraparenchymal cyst. No peripancreatic edema. Spleen: No splenomegaly. No focal mass lesion. Adrenals/Urinary Tract: No adrenal nodule or mass. Mild to moderate right hydroureteronephrosis identified with right ureteral dilatation extending  down to the right pelvic sidewall. Decreased perfusion to the right kidney is compatible with obstructive uropathy. Left kidney and ureter unremarkable. Bladder is nondistended. Stomach/Bowel: Stomach is unremarkable. No gastric wall thickening. No evidence of outlet obstruction. Duodenum is normally positioned as is the ligament of Treitz. No small bowel wall thickening. No small bowel dilatation. No  gross colonic mass. No colonic wall thickening. Vascular/Lymphatic: There is abdominal aortic atherosclerosis without aneurysm. Portal vein and superior mesenteric vein are patent. Bulky retroperitoneal lymphadenopathy is identified in the abdomen. Nodal conglomeration circumferentially encases the aorta just inferior to the renal arteries. This nodal mass measures 3.4 x 5.3 cm (image 58/series 2). Just proximal to the aortic bifurcation, nodal conglomeration encasing the distal aorta measures 3.5 x 3.2 cm. 1.3 cm short axis right common iliac node is visible on 77/2. 1.0 cm short axis lymph node is seen along the left common iliac chain (81/2). Lymphadenopathy is noted in the pelvic sidewalls bilaterally, right greater than left. Index 1.8 cm short axis right pelvic sidewall node is visible on 91/2. Another right pelvic sidewall lymph node measures 2.4 cm short axis on 95/2. Reproductive: Uterus appears enlarged and heterogeneous, measuring 14.2 x 7.3 x 7.0 cm. Cervix is prominent heterogeneous with apparent wall thickening in the upper vagina. Other: Small volume free fluid noted adjacent to the liver and in the cul-de-sac. 3.3 x 4.9 cm irregular soft tissue lesion is identified to the left of the bladder on 102/2. Nodularity in the cul-de-sac is suspicious for peritoneal disease. Musculoskeletal: 2.2 cm mixed lytic and lucent lesion is identified in the left sacrum with tiny sclerotic foci evident in the left iliac bone. IMPRESSION: 1. Heterogeneous enlargement of the uterus and cervix with apparent soft tissue thickening in the upper vagina. Cervical and vaginal tissues not well evaluated by CT. 2. Bulky retroperitoneal lymphadenopathy in the abdomen with bilateral common iliac and pelvic sidewall lymphadenopathy in the pelvis. Imaging features consistent with metastatic disease. 3. 5 cm soft tissue lesion to the left of the bladder is consistent with a metastatic deposit. 4. Mild to moderate right  hydroureteronephrosis with decreased perfusion to the right kidney. Right ureteral obstruction is at the level of the right pelvic sidewall. 5. 2.2 cm mixed lytic and lucent lesion in the left sacrum, indeterminate, but metastatic disease not excluded. 6. Several 3-4 mm nodules identified in the lungs. Close attention on follow-up recommended as metastatic disease not excluded. 7. Small volume ascites. Electronically Signed   By: Misty Stanley M.D.   On: 05/01/2019 16:38   Nm Pet Image Initial (pi) Skull Base To Thigh  Result Date: 05/07/2019 CLINICAL DATA:  Initial treatment strategy for cervical cancer. EXAM: NUCLEAR MEDICINE PET SKULL BASE TO THIGH TECHNIQUE: 6.5 mCi F-18 FDG was injected intravenously. Full-ring PET imaging was performed from the skull base to thigh after the radiotracer. CT data was obtained and used for attenuation correction and anatomic localization. Fasting blood glucose: 94 mg/dl COMPARISON:  05/01/2019 CT chest, abdomen and pelvis. FINDINGS: Mediastinal blood pool activity: SUV max 2.4 Liver activity: SUV max NA NECK: No hypermetabolic lymph nodes in the neck. Incidental CT findings: none CHEST: No hypermetabolic axillary, mediastinal or hilar lymph nodes. No hypermetabolic pulmonary findings. Incidental CT findings: No acute consolidative airspace disease or lung masses. A few scattered small solid pulmonary nodules in both lungs, largest 4 mm in the medial basilar left lower lobe (series 3/image 119), below PET resolution. ABDOMEN/PELVIS: Hypermetabolic bilateral retrocrural lymphadenopathy. Representative 1.0 cm left retrocrural node  with max SUV 7.0 (series 3/image 127). Multiple enlarged hypermetabolic left para-aortic and aortocaval nodes. Representative 2.9 cm left para-aortic node with max SUV 12.8 (series 3/image 152). Representative 1.9 cm aortocaval node with max SUV 9.7 (series 3/image 158). Hypermetabolic bilateral common iliac, right external iliac and right internal  iliac lymphadenopathy. Representative 2.1 cm left common iliac node with max SUV 12.3 (series 3/image 186). Representative 1.8 cm right external iliac node with max SUV 16.5 (series 3/image 212). Representative 1.3 cm right internal iliac node with max SUV 13.1 (series 3/image 202). Hypermetabolic mass in the cervix with max SUV 19.1, poorly delineated on the noncontrast CT images. Hypermetabolism throughout the enlarged lower uterine segment and body of the uterus compatible with malignant involvement of the uterus with max SUV 14.7. Small foci of hypermetabolism at the periphery of the tampon within the anterior vagina without mass correlate on the CT images, favor urinary contamination. Hypermetabolic 6.1 x 3.4 cm soft tissue implant in the anterior left pelvis (series 3/image 216) surrounded by small volume pelvic ascites with max SUV 6.5 (series 3/image 216). No abnormal hypermetabolic activity within the liver, pancreas, adrenal glands, or spleen. Incidental CT findings: Mildly atherosclerotic nonaneurysmal abdominal aorta. Mild right hydroureteronephrosis to the level of the upper pelvic right ureter. SKELETON: No focal hypermetabolic activity to suggest skeletal metastasis. No hypermetabolism associated with a mixed lytic and sclerotic upper left sacral lesion on series 3/image 192. Incidental CT findings: none IMPRESSION: 1. Hypermetabolic mass in the cervix compatible with known primary cervical malignancy. Hypermetabolism throughout the enlarged lower uterine segment and body of the uterus compatible with malignant involvement of the uterus. 2. Hypermetabolic right internal and external iliac, bilateral common iliac, aortocaval, left para-aortic and bilateral retrocrural lymphadenopathy compatible with metastatic nodal disease. 3. Hypermetabolic soft tissue implant in the anterior left pelvis surrounded by small volume pelvic ascites, favor pelvic peritoneal metastasis. 4. No hypermetabolic osseous  metastatic disease. No hypermetabolism associated with the mixed lytic and sclerotic upper left sacral lesion, which is probably benign. 5. Tiny bilateral scattered solid pulmonary nodules, below PET resolution, indeterminate, recommend attention on close chest CT follow-up in 3 months. 6.  Aortic Atherosclerosis (ICD10-I70.0). Electronically Signed   By: Ilona Sorrel M.D.   On: 05/07/2019 16:02    ASSESSMENT: Stage IIIb cervical cancer.  PLAN:    1.  Stage IIIb cervical cancer: Biopsy results from April 24, 2019 confirmed the diagnosis.  PET scan results from May 07, 2019 reviewed independently with extensive nodal disease as well as mass-effect causing possible hydronephrosis.  Case discussed with gynecology oncology as well as radiation oncology.  Patient is not a surgical candidate as of right now, but will likely benefit from daily XRT along with concurrent weekly cisplatin 40 mg/m2.  Plan to reimage after the conclusion of XRT to determine if brachii therapy is possible.  We will also considered rebiopsy with interventional radiology to obtain additional tissue for PDL-1 testing.  If there is still residual disease, she benefit from systemic treatment with carboplatinum/Taxol or cisplatin/Taxol plus or minus bevacizumab.  Continue daily XRT.  Proceed with cycle 2 of weekly cisplatin today.  Return to clinic in 1 week for further evaluation and consideration of cycle 3. 2.  Hydronephrosis: Seen on CT scan.  Monitor closely. 3.  Venous access: Port placement was discussed, but patient would like to defer at this time. 4.  Renal insufficiency: Patient's creatinine is increased, but stable at 1.26. 5.  Anemia: Patient's hemoglobin has trended down and  is now 7.4.  She has a decreased total iron and iron saturation ratio, therefore will receive Feraheme with her next infusion. 6.  Abdominal pain: Continue hydrocodone as prescribed.   Patient expressed understanding and was in agreement with this  plan. She also understands that She can call clinic at any time with any questions, concerns, or complaints.   Cancer Staging Cervical cancer ([place]) Staging form: Cervix Uteri, AJCC 8th Edition - Clinical: FIGO Stage IIIB (cT3b, cN1, cM0) - Signed by Lloyd Huger, MD on 05/09/2019   Lloyd Huger, MD   05/25/2019 6:42 AM

## 2019-05-25 ENCOUNTER — Ambulatory Visit
Admission: RE | Admit: 2019-05-25 | Discharge: 2019-05-25 | Disposition: A | Discharge status: Home / Self Care | Payer: BC Managed Care – PPO | Source: Ambulatory Visit | Attending: Radiation Oncology | Admitting: Radiation Oncology

## 2019-05-25 ENCOUNTER — Other Ambulatory Visit: Payer: Self-pay

## 2019-05-25 DIAGNOSIS — D649 Anemia, unspecified: Secondary | ICD-10-CM | POA: Diagnosis not present

## 2019-05-25 DIAGNOSIS — N289 Disorder of kidney and ureter, unspecified: Secondary | ICD-10-CM | POA: Diagnosis not present

## 2019-05-25 DIAGNOSIS — C530 Malignant neoplasm of endocervix: Secondary | ICD-10-CM | POA: Diagnosis not present

## 2019-05-25 DIAGNOSIS — C772 Secondary and unspecified malignant neoplasm of intra-abdominal lymph nodes: Secondary | ICD-10-CM | POA: Diagnosis not present

## 2019-05-25 DIAGNOSIS — C539 Malignant neoplasm of cervix uteri, unspecified: Secondary | ICD-10-CM | POA: Diagnosis not present

## 2019-05-25 DIAGNOSIS — Z5111 Encounter for antineoplastic chemotherapy: Secondary | ICD-10-CM | POA: Diagnosis not present

## 2019-05-25 DIAGNOSIS — Z510 Encounter for antineoplastic radiation therapy: Secondary | ICD-10-CM | POA: Diagnosis not present

## 2019-05-27 NOTE — Progress Notes (Signed)
[place]  [phone] [phone]  ID: Mariah Mcmahon OB: Apr 29, 1966  MR#: [id number]  BWL#:[id number]  Patient Care Team: Venita Lick, NP as PCP - General (Nurse Practitioner) Clent Jacks, RN as Oncology Nurse Navigator  CHIEF COMPLAINT: Stage IIIb cervical cancer  INTERVAL HISTORY: Patient returns to clinic today for further evaluation and consideration of cycle 3 of weekly cisplatin.  She continues to have chronic weakness and fatigue, but otherwise feels well.  She states her abnormal vaginal bleeding has resolved.  She does not complain of abdominal pain or bloating today.  She continues to be anxious.  She has no neurologic complaints.  She denies any recent fevers or illnesses.  She has a good appetite and denies weight loss.  She has no chest pain, shortness of breath, cough, or hemoptysis.  She denies any nausea, vomiting, constipation, or diarrhea.  She has no urinary complaints.  Patient offers no further specific complaints today.  REVIEW OF SYSTEMS:   Review of Systems  Constitutional: Positive for malaise/fatigue. Negative for fever and weight loss.  Respiratory: Negative.  Negative for cough and shortness of breath.   Cardiovascular: Negative.  Negative for chest pain and leg swelling.  Gastrointestinal: Negative for abdominal pain.  Genitourinary: Negative.  Negative for hematuria.  Musculoskeletal: Negative.  Negative for back pain.  Skin: Negative.  Negative for rash.  Neurological: Positive for weakness. Negative for dizziness, focal weakness and headaches.  Psychiatric/Behavioral: The patient is nervous/anxious.     As per HPI. Otherwise, a complete review of systems is negative.  PAST MEDICAL HISTORY: Past Medical History:  Diagnosis Date   No pertinent past medical history     PAST SURGICAL HISTORY: Past Surgical History:  Procedure Laterality Date   MASS EXCISION     Throat   TONSILLECTOMY       FAMILY HISTORY: Family History  Problem Relation Age of Onset   Brain cancer Mother 90   Alcohol abuse Father    Heart disease Father    Prostate cancer Father    Heart attack Father    Diabetes Father     ADVANCED DIRECTIVES (Y/N):  N  HEALTH MAINTENANCE: Social History   Tobacco Use   Smoking status: Current Every Day Smoker    Packs/day: 0.50    Years: 30.00    Pack years: 15.00    Types: Cigarettes   Smokeless tobacco: Never Used  Substance Use Topics   Alcohol use: Not Currently   Drug use: Never     Colonoscopy:  PAP:  Bone density:  Lipid panel:  No Known Allergies  Current Outpatient Medications  Medication Sig Dispense Refill   cyclobenzaprine (FLEXERIL) 10 MG tablet Take 1 tablet by mouth 1 day or 1 dose.     meloxicam (MOBIC) 7.5 MG tablet Take 7.5 mg by mouth daily.     methocarbamol (ROBAXIN) 500 MG tablet Take 1 tablet (500 mg total) by mouth 2 (two) times daily as needed for muscle spasms (for lower back pain). 120 tablet 1   ondansetron (ZOFRAN) 8 MG tablet Take 1 tablet (8 mg total) by mouth 2 (two) times daily as needed. Start on the third day after chemotherapy. 30 tablet 2   prochlorperazine (COMPAZINE) 10 MG tablet Take 1 tablet (10 mg total) by mouth every 6 (six) hours as needed (Nausea or vomiting). 60 tablet 2   HYDROcodone-acetaminophen (NORCO) 5-325 MG tablet Take 1 tablet by mouth every 6 (six) hours as needed for  moderate pain. (Patient not taking: Reported on 05/31/2019) 45 tablet 0   No current facility-administered medications for this visit.     OBJECTIVE: Vitals:   05/31/19 0913  BP: 136/77  Pulse: (!) 108  Temp: 98.9 F (37.2 C)     Body mass index is 21.2 kg/m.    ECOG FS:0 - Asymptomatic  General: Thin, no acute distress. Eyes: Pink conjunctiva, anicteric sclera. HEENT: Normocephalic, moist mucous membranes. Lungs: Clear to auscultation bilaterally. Heart: Regular rate and rhythm. No rubs, murmurs,  or gallops. Abdomen: Soft, nontender, nondistended. No organomegaly noted, normoactive bowel sounds. Musculoskeletal: No edema, cyanosis, or clubbing. Neuro: Alert, answering all questions appropriately. Cranial nerves grossly intact. Skin: No rashes or petechiae noted. Psych: Normal affect.  LAB RESULTS:  Lab Results  Component Value Date   NA 136 05/31/2019   K 3.3 (L) 05/31/2019   CL 102 05/31/2019   CO2 24 05/31/2019   GLUCOSE 132 (H) 05/31/2019   BUN 20 05/31/2019   CREATININE 1.17 (H) 05/31/2019   CALCIUM 8.6 (L) 05/31/2019   PROT 6.8 04/09/2019   ALBUMIN 4.0 04/09/2019   AST 24 04/09/2019   ALT 6 04/09/2019   ALKPHOS 79 04/09/2019   BILITOT 0.4 04/09/2019   GFRNONAA 54 (L) 05/31/2019   GFRAA >60 05/31/2019    Lab Results  Component Value Date   WBC 8.1 05/31/2019   NEUTROABS 7.0 05/31/2019   HGB 6.6 (L) 05/31/2019   HCT 20.6 (L) 05/31/2019   MCV 95.8 05/31/2019   PLT 273 05/31/2019     STUDIES: Nm Pet Image Initial (pi) Skull Base To Thigh  Result Date: 05/07/2019 CLINICAL DATA:  Initial treatment strategy for cervical cancer. EXAM: NUCLEAR MEDICINE PET SKULL BASE TO THIGH TECHNIQUE: 6.5 mCi F-18 FDG was injected intravenously. Full-ring PET imaging was performed from the skull base to thigh after the radiotracer. CT data was obtained and used for attenuation correction and anatomic localization. Fasting blood glucose: 94 mg/dl COMPARISON:  05/01/2019 CT chest, abdomen and pelvis. FINDINGS: Mediastinal blood pool activity: SUV max 2.4 Liver activity: SUV max NA NECK: No hypermetabolic lymph nodes in the neck. Incidental CT findings: none CHEST: No hypermetabolic axillary, mediastinal or hilar lymph nodes. No hypermetabolic pulmonary findings. Incidental CT findings: No acute consolidative airspace disease or lung masses. A few scattered small solid pulmonary nodules in both lungs, largest 4 mm in the medial basilar left lower lobe (series 3/image 119), below PET  resolution. ABDOMEN/PELVIS: Hypermetabolic bilateral retrocrural lymphadenopathy. Representative 1.0 cm left retrocrural node with max SUV 7.0 (series 3/image 127). Multiple enlarged hypermetabolic left para-aortic and aortocaval nodes. Representative 2.9 cm left para-aortic node with max SUV 12.8 (series 3/image 152). Representative 1.9 cm aortocaval node with max SUV 9.7 (series 3/image 158). Hypermetabolic bilateral common iliac, right external iliac and right internal iliac lymphadenopathy. Representative 2.1 cm left common iliac node with max SUV 12.3 (series 3/image 186). Representative 1.8 cm right external iliac node with max SUV 16.5 (series 3/image 212). Representative 1.3 cm right internal iliac node with max SUV 13.1 (series 3/image 202). Hypermetabolic mass in the cervix with max SUV 19.1, poorly delineated on the noncontrast CT images. Hypermetabolism throughout the enlarged lower uterine segment and body of the uterus compatible with malignant involvement of the uterus with max SUV 14.7. Small foci of hypermetabolism at the periphery of the tampon within the anterior vagina without mass correlate on the CT images, favor urinary contamination. Hypermetabolic 6.1 x 3.4 cm soft tissue implant in the anterior  left pelvis (series 3/image 216) surrounded by small volume pelvic ascites with max SUV 6.5 (series 3/image 216). No abnormal hypermetabolic activity within the liver, pancreas, adrenal glands, or spleen. Incidental CT findings: Mildly atherosclerotic nonaneurysmal abdominal aorta. Mild right hydroureteronephrosis to the level of the upper pelvic right ureter. SKELETON: No focal hypermetabolic activity to suggest skeletal metastasis. No hypermetabolism associated with a mixed lytic and sclerotic upper left sacral lesion on series 3/image 192. Incidental CT findings: none IMPRESSION: 1. Hypermetabolic mass in the cervix compatible with known primary cervical malignancy. Hypermetabolism throughout the  enlarged lower uterine segment and body of the uterus compatible with malignant involvement of the uterus. 2. Hypermetabolic right internal and external iliac, bilateral common iliac, aortocaval, left para-aortic and bilateral retrocrural lymphadenopathy compatible with metastatic nodal disease. 3. Hypermetabolic soft tissue implant in the anterior left pelvis surrounded by small volume pelvic ascites, favor pelvic peritoneal metastasis. 4. No hypermetabolic osseous metastatic disease. No hypermetabolism associated with the mixed lytic and sclerotic upper left sacral lesion, which is probably benign. 5. Tiny bilateral scattered solid pulmonary nodules, below PET resolution, indeterminate, recommend attention on close chest CT follow-up in 3 months. 6.  Aortic Atherosclerosis (ICD10-I70.0). Electronically Signed   By: Ilona Sorrel M.D.   On: 05/07/2019 16:02    ASSESSMENT: Stage IIIb cervical cancer.  PLAN:    1.  Stage IIIb cervical cancer: Biopsy results from April 24, 2019 confirmed the diagnosis.  PET scan results from May 07, 2019 reviewed independently with extensive nodal disease as well as mass-effect causing possible hydronephrosis.  Case discussed with gynecology oncology as well as radiation oncology.  Patient is not a surgical candidate as of right now, but will likely benefit from daily XRT along with concurrent weekly cisplatin 40 mg/m2.  Plan to reimage after the conclusion of XRT to determine if brachii therapy is possible.  Will also considered rebiopsy with interventional radiology to obtain additional tissue for PDL-1 testing.  If there is still residual disease, she benefit from systemic treatment with carboplatinum/Taxol or cisplatin/Taxol plus or minus bevacizumab.  Continue daily XRT.  Proceed with cycle 3 of weekly cisplatin today.  Return to clinic in 1 week for further evaluation and consideration of cycle 4.   2.  Hydronephrosis: Seen on CT scan.  Monitor closely. 3.  Venous  access: Port placement was discussed, but patient would like to defer at this time. 4.  Renal insufficiency: Patient's creatinine is only mildly elevated at 1.17.  Monitor. 5.  Anemia: Patient's hemoglobin has trended down despite receiving IV iron this past Monday.  Patient reports her abnormal uterine bleeding has resolved.  Return to clinic tomorrow for 1 unit of packed red blood cells.  Proceed with treatment as above.  6.  Abdominal pain: Improved.  Continue hydrocodone as prescribed.  Patient also continues to use meloxicam sparingly.   Patient expressed understanding and was in agreement with this plan. She also understands that She can call clinic at any time with any questions, concerns, or complaints.   Cancer Staging Cervical cancer ([place]) Staging form: Cervix Uteri, AJCC 8th Edition - Clinical: FIGO Stage IIIB (cT3b, cN1, cM0) - Signed by Lloyd Huger, MD on 05/09/2019   Lloyd Huger, MD   06/01/2019 6:23 AM

## 2019-05-28 ENCOUNTER — Ambulatory Visit
Admission: RE | Admit: 2019-05-28 | Discharge: 2019-05-28 | Disposition: A | Discharge status: Home / Self Care | Payer: BC Managed Care – PPO | Source: Ambulatory Visit | Attending: Radiation Oncology | Admitting: Radiation Oncology

## 2019-05-28 ENCOUNTER — Inpatient Hospital Stay: Payer: BC Managed Care – PPO

## 2019-05-28 ENCOUNTER — Other Ambulatory Visit: Payer: Self-pay

## 2019-05-28 VITALS — BP 137/83 | HR 103 | Temp 97.3°F | Resp 20

## 2019-05-28 DIAGNOSIS — C530 Malignant neoplasm of endocervix: Secondary | ICD-10-CM | POA: Diagnosis not present

## 2019-05-28 DIAGNOSIS — Z510 Encounter for antineoplastic radiation therapy: Secondary | ICD-10-CM | POA: Diagnosis not present

## 2019-05-28 DIAGNOSIS — Z5111 Encounter for antineoplastic chemotherapy: Secondary | ICD-10-CM | POA: Diagnosis not present

## 2019-05-28 DIAGNOSIS — N289 Disorder of kidney and ureter, unspecified: Secondary | ICD-10-CM | POA: Diagnosis not present

## 2019-05-28 DIAGNOSIS — C772 Secondary and unspecified malignant neoplasm of intra-abdominal lymph nodes: Secondary | ICD-10-CM | POA: Diagnosis not present

## 2019-05-28 DIAGNOSIS — C539 Malignant neoplasm of cervix uteri, unspecified: Secondary | ICD-10-CM | POA: Diagnosis not present

## 2019-05-28 DIAGNOSIS — D500 Iron deficiency anemia secondary to blood loss (chronic): Secondary | ICD-10-CM

## 2019-05-28 DIAGNOSIS — D649 Anemia, unspecified: Secondary | ICD-10-CM | POA: Diagnosis not present

## 2019-05-28 MED ORDER — SODIUM CHLORIDE 0.9 % IV SOLN
Freq: Once | INTRAVENOUS | Status: AC
  Administered 2019-05-28: 11:00:00 via INTRAVENOUS
  Filled 2019-05-28: qty 250

## 2019-05-28 MED ORDER — SODIUM CHLORIDE 0.9 % IV SOLN
510.0000 mg | Freq: Once | INTRAVENOUS | Status: AC
  Administered 2019-05-28: 510 mg via INTRAVENOUS
  Filled 2019-05-28: qty 17

## 2019-05-29 ENCOUNTER — Ambulatory Visit
Admission: RE | Admit: 2019-05-29 | Discharge: 2019-05-29 | Disposition: A | Discharge status: Home / Self Care | Payer: BC Managed Care – PPO | Source: Ambulatory Visit | Attending: Radiation Oncology | Admitting: Radiation Oncology

## 2019-05-29 ENCOUNTER — Other Ambulatory Visit: Payer: Self-pay

## 2019-05-29 ENCOUNTER — Other Ambulatory Visit: Payer: Self-pay | Admitting: *Deleted

## 2019-05-29 DIAGNOSIS — Z5111 Encounter for antineoplastic chemotherapy: Secondary | ICD-10-CM | POA: Diagnosis not present

## 2019-05-29 DIAGNOSIS — Z510 Encounter for antineoplastic radiation therapy: Secondary | ICD-10-CM | POA: Diagnosis not present

## 2019-05-29 DIAGNOSIS — C772 Secondary and unspecified malignant neoplasm of intra-abdominal lymph nodes: Secondary | ICD-10-CM | POA: Diagnosis not present

## 2019-05-29 DIAGNOSIS — N289 Disorder of kidney and ureter, unspecified: Secondary | ICD-10-CM | POA: Diagnosis not present

## 2019-05-29 DIAGNOSIS — D649 Anemia, unspecified: Secondary | ICD-10-CM | POA: Diagnosis not present

## 2019-05-29 DIAGNOSIS — C539 Malignant neoplasm of cervix uteri, unspecified: Secondary | ICD-10-CM | POA: Diagnosis not present

## 2019-05-29 DIAGNOSIS — C530 Malignant neoplasm of endocervix: Secondary | ICD-10-CM | POA: Diagnosis not present

## 2019-05-30 ENCOUNTER — Ambulatory Visit
Admission: RE | Admit: 2019-05-30 | Discharge: 2019-05-30 | Disposition: A | Discharge status: Home / Self Care | Payer: BC Managed Care – PPO | Source: Ambulatory Visit | Attending: Radiation Oncology | Admitting: Radiation Oncology

## 2019-05-30 ENCOUNTER — Other Ambulatory Visit: Payer: Self-pay

## 2019-05-30 DIAGNOSIS — D649 Anemia, unspecified: Secondary | ICD-10-CM | POA: Diagnosis not present

## 2019-05-30 DIAGNOSIS — Z510 Encounter for antineoplastic radiation therapy: Secondary | ICD-10-CM | POA: Diagnosis not present

## 2019-05-30 DIAGNOSIS — N289 Disorder of kidney and ureter, unspecified: Secondary | ICD-10-CM | POA: Diagnosis not present

## 2019-05-30 DIAGNOSIS — C539 Malignant neoplasm of cervix uteri, unspecified: Secondary | ICD-10-CM | POA: Diagnosis not present

## 2019-05-30 DIAGNOSIS — C772 Secondary and unspecified malignant neoplasm of intra-abdominal lymph nodes: Secondary | ICD-10-CM | POA: Diagnosis not present

## 2019-05-30 DIAGNOSIS — C530 Malignant neoplasm of endocervix: Secondary | ICD-10-CM | POA: Diagnosis not present

## 2019-05-30 DIAGNOSIS — Z5111 Encounter for antineoplastic chemotherapy: Secondary | ICD-10-CM | POA: Diagnosis not present

## 2019-05-31 ENCOUNTER — Inpatient Hospital Stay: Payer: BC Managed Care – PPO

## 2019-05-31 ENCOUNTER — Inpatient Hospital Stay (HOSPITAL_BASED_OUTPATIENT_CLINIC_OR_DEPARTMENT_OTHER): Payer: BC Managed Care – PPO | Admitting: Oncology

## 2019-05-31 ENCOUNTER — Encounter: Payer: Self-pay | Admitting: Oncology

## 2019-05-31 ENCOUNTER — Inpatient Hospital Stay (HOSPITAL_BASED_OUTPATIENT_CLINIC_OR_DEPARTMENT_OTHER): Payer: BC Managed Care – PPO | Admitting: Hospice and Palliative Medicine

## 2019-05-31 ENCOUNTER — Other Ambulatory Visit: Payer: Self-pay

## 2019-05-31 ENCOUNTER — Ambulatory Visit
Admission: RE | Admit: 2019-05-31 | Discharge: 2019-05-31 | Disposition: A | Discharge status: Home / Self Care | Payer: BC Managed Care – PPO | Source: Ambulatory Visit | Attending: Radiation Oncology | Admitting: Radiation Oncology

## 2019-05-31 VITALS — BP 117/76 | HR 80 | Resp 18

## 2019-05-31 VITALS — BP 136/77 | HR 108 | Temp 98.9°F | Ht 62.0 in | Wt 115.9 lb

## 2019-05-31 DIAGNOSIS — D649 Anemia, unspecified: Secondary | ICD-10-CM | POA: Diagnosis not present

## 2019-05-31 DIAGNOSIS — Z79899 Other long term (current) drug therapy: Secondary | ICD-10-CM

## 2019-05-31 DIAGNOSIS — C772 Secondary and unspecified malignant neoplasm of intra-abdominal lymph nodes: Secondary | ICD-10-CM | POA: Diagnosis not present

## 2019-05-31 DIAGNOSIS — N131 Hydronephrosis with ureteral stricture, not elsewhere classified: Secondary | ICD-10-CM

## 2019-05-31 DIAGNOSIS — Z515 Encounter for palliative care: Secondary | ICD-10-CM

## 2019-05-31 DIAGNOSIS — C539 Malignant neoplasm of cervix uteri, unspecified: Secondary | ICD-10-CM

## 2019-05-31 DIAGNOSIS — C530 Malignant neoplasm of endocervix: Secondary | ICD-10-CM

## 2019-05-31 DIAGNOSIS — Z5111 Encounter for antineoplastic chemotherapy: Secondary | ICD-10-CM | POA: Diagnosis not present

## 2019-05-31 DIAGNOSIS — Z510 Encounter for antineoplastic radiation therapy: Secondary | ICD-10-CM | POA: Diagnosis not present

## 2019-05-31 DIAGNOSIS — F17210 Nicotine dependence, cigarettes, uncomplicated: Secondary | ICD-10-CM

## 2019-05-31 DIAGNOSIS — C786 Secondary malignant neoplasm of retroperitoneum and peritoneum: Secondary | ICD-10-CM

## 2019-05-31 DIAGNOSIS — G893 Neoplasm related pain (acute) (chronic): Secondary | ICD-10-CM

## 2019-05-31 DIAGNOSIS — N289 Disorder of kidney and ureter, unspecified: Secondary | ICD-10-CM | POA: Diagnosis not present

## 2019-05-31 LAB — BASIC METABOLIC PANEL
Anion gap: 10 (ref 5–15)
BUN: 20 mg/dL (ref 6–20)
CO2: 24 mmol/L (ref 22–32)
Calcium: 8.6 mg/dL — ABNORMAL LOW (ref 8.9–10.3)
Chloride: 102 mmol/L (ref 98–111)
Creatinine, Ser: 1.17 mg/dL — ABNORMAL HIGH (ref 0.44–1.00)
GFR calc Af Amer: >60 mL/min (ref >60)
GFR calc non Af Amer: 54 mL/min — ABNORMAL LOW (ref >60)
Glucose, Bld: 132 mg/dL — ABNORMAL HIGH (ref 70–99)
Potassium: 3.3 mmol/L — ABNORMAL LOW (ref 3.5–5.1)
Sodium: 136 mmol/L (ref 135–145)

## 2019-05-31 LAB — CBC WITH DIFFERENTIAL/PLATELET
Abs Immature Granulocytes: 0.1 10*3/uL — ABNORMAL HIGH (ref 0.00–0.07)
Basophils Absolute: 0 10*3/uL (ref 0.0–0.1)
Basophils Relative: 0 %
Eosinophils Absolute: 0.1 10*3/uL (ref 0.0–0.5)
Eosinophils Relative: 1 %
HCT: 20.6 % — ABNORMAL LOW (ref 36.0–46.0)
Hemoglobin: 6.6 g/dL — ABNORMAL LOW (ref 12.0–15.0)
Immature Granulocytes: 1 %
Lymphocytes Relative: 2 %
Lymphs Abs: 0.2 10*3/uL — ABNORMAL LOW (ref 0.7–4.0)
MCH: 30.7 pg (ref 26.0–34.0)
MCHC: 32 g/dL (ref 30.0–36.0)
MCV: 95.8 fL (ref 80.0–100.0)
Monocytes Absolute: 0.7 10*3/uL (ref 0.1–1.0)
Monocytes Relative: 9 %
Neutro Abs: 7 10*3/uL (ref 1.7–7.7)
Neutrophils Relative %: 87 %
Platelets: 273 10*3/uL (ref 150–400)
RBC: 2.15 MIL/uL — ABNORMAL LOW (ref 3.87–5.11)
RDW: 14.4 % (ref 11.5–15.5)
WBC: 8.1 10*3/uL (ref 4.0–10.5)
nRBC: 0 % (ref 0.0–0.2)

## 2019-05-31 LAB — SAMPLE TO BLOOD BANK

## 2019-05-31 LAB — PREPARE RBC (CROSSMATCH)

## 2019-05-31 LAB — ABO/RH: ABO/RH(D): A POS

## 2019-05-31 MED ORDER — SODIUM CHLORIDE 0.9 % IV SOLN
40.0000 mg/m2 | Freq: Once | INTRAVENOUS | Status: AC
  Administered 2019-05-31: 14:00:00 61 mg via INTRAVENOUS
  Filled 2019-05-31: qty 61

## 2019-05-31 MED ORDER — POTASSIUM CHLORIDE 2 MEQ/ML IV SOLN
Freq: Once | INTRAVENOUS | Status: AC
  Administered 2019-05-31: 11:00:00 via INTRAVENOUS
  Filled 2019-05-31: qty 1000

## 2019-05-31 MED ORDER — SODIUM CHLORIDE 0.9 % IV SOLN
Freq: Once | INTRAVENOUS | Status: AC
  Administered 2019-05-31: 10:00:00 via INTRAVENOUS
  Filled 2019-05-31: qty 250

## 2019-05-31 MED ORDER — SODIUM CHLORIDE 0.9 % IV SOLN
Freq: Once | INTRAVENOUS | Status: AC
  Administered 2019-05-31: 13:00:00 via INTRAVENOUS
  Filled 2019-05-31: qty 5

## 2019-05-31 MED ORDER — PALONOSETRON HCL INJECTION 0.25 MG/5ML
0.2500 mg | Freq: Once | INTRAVENOUS | Status: AC
  Administered 2019-05-31: 0.25 mg via INTRAVENOUS
  Filled 2019-05-31: qty 5

## 2019-05-31 NOTE — Progress Notes (Signed)
Patient stated that she continues to have right hip pain. Patient stated that she would like something different for her pain.

## 2019-05-31 NOTE — Progress Notes (Signed)
[facility]  Telephone:([phone] [phone]) [phone]   Name: Mariah Mcmahon Date: 05/31/2019 MRN: [id number]  DOB: January 01, 1966  Patient Care Team: Venita Lick, NP as PCP - General (Nurse Practitioner) Clent Jacks, RN as Oncology Nurse Navigator    REASON FOR CONSULTATION: Palliative Care consult requested for this 53 y.o. 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  Patient is felt not to be a surgical candidate and treatment was initiated with concurrent chemoradiation.  Patient was referred to palliative care to help address goals and manage ongoing symptoms.   SOCIAL HISTORY:     reports that she has been smoking cigarettes. She has a 15.00 pack-year smoking history. She has never used smokeless tobacco. She reports previous alcohol use. She reports that she does not use drugs.  Patient is not married.  She lives at home alone.  She has a son who is involved in her care, who lives nearby.  Patient previously worked at [employer] in [place] and recently stopped working due to [employer].  ADVANCE DIRECTIVES:  Does not have  CODE STATUS:   PAST MEDICAL HISTORY: Past Medical History:  Diagnosis Date  . No pertinent past medical history     PAST SURGICAL HISTORY:  Past Surgical History:  Procedure Laterality Date  . MASS EXCISION     Throat  . TONSILLECTOMY      HEMATOLOGY/ONCOLOGY HISTORY:  Oncology History  Cervical cancer ([place])  05/04/2019 Initial Diagnosis   Cervical cancer ([place])   05/09/2019 Cancer Staging   Staging form: Cervix Uteri, AJCC 8th Edition - Clinical: FIGO Stage IIIB (cT3b, cN1, cM0) - Signed by Lloyd Huger, MD on 05/09/2019   05/17/2019 -  Chemotherapy   The patient had palonosetron (ALOXI) injection 0.25 mg, 0.25 mg, Intravenous,  Once, 3 of 7 cycles Administration:  0.25 mg (05/17/2019), 0.25 mg (05/24/2019) CISplatin (PLATINOL) 61 mg in sodium chloride 0.9 % 250 mL chemo infusion, 40 mg/m2 = 61 mg, Intravenous,  Once, 3 of 7 cycles Administration: 61 mg (05/17/2019), 61 mg (05/24/2019) fosaprepitant (EMEND) 150 mg, dexamethasone (DECADRON) 12 mg in sodium chloride 0.9 % 145 mL IVPB, , Intravenous,  Once, 3 of 7 cycles Administration:  (05/17/2019),  (05/24/2019)  for chemotherapy treatment.      ALLERGIES:  has No Known Allergies.  MEDICATIONS:  Current Outpatient Medications  Medication Sig Dispense Refill  . cyclobenzaprine (FLEXERIL) 10 MG tablet Take 1 tablet by mouth 1 day or 1 dose.    Marland Kitchen HYDROcodone-acetaminophen (NORCO) 5-325 MG tablet Take 1 tablet by mouth every 6 (six) hours as needed for moderate pain. (Patient not taking: Reported on 05/31/2019) 45 tablet 0  . meloxicam (MOBIC) 7.5 MG tablet Take 7.5 mg by mouth daily.    . methocarbamol (ROBAXIN) 500 MG tablet Take 1 tablet (500 mg total) by mouth 2 (two) times daily as needed for muscle spasms (for lower back pain). 120 tablet 1  . ondansetron (ZOFRAN) 8 MG tablet Take 1 tablet (8 mg total) by mouth 2 (two) times daily as needed. Start on the third day after chemotherapy. 30 tablet 2  . prochlorperazine (COMPAZINE) 10 MG tablet Take 1 tablet (10 mg total) by mouth every 6 (six) hours as needed (Nausea or vomiting). 60 tablet 2   No current facility-administered medications for this visit.    Facility-Administered Medications Ordered in Other Visits  Medication Dose Route Frequency Provider Last Rate Last Dose  . CISplatin (PLATINOL) 61 mg in sodium chloride 0.9 % 250 mL chemo infusion  40 mg/m2 (Treatment Plan Recorded) Intravenous Once Lloyd Huger, MD      . fosaprepitant (EMEND) 150 mg, dexamethasone (DECADRON) 12 mg in sodium chloride 0.9 % 145 mL IVPB   Intravenous Once Lloyd Huger, MD      . palonosetron (ALOXI) injection 0.25 mg  0.25 mg Intravenous Once Lloyd Huger,  MD        VITAL SIGNS: There were no vitals taken for this visit. There were no vitals filed for this visit.  Estimated body mass index is 21.2 kg/m as calculated from the following:   Height as of an earlier encounter on 05/31/19: '5\' 2"'$  (1.575 m).   Weight as of an earlier encounter on 05/31/19: 115 lb 14.4 oz (52.6 kg).  LABS: CBC:    Component Value Date/Time   WBC 8.1 05/31/2019 0857   HGB 6.6 (L) 05/31/2019 0857   HGB 14.5 04/09/2019 0918   HCT 20.6 (L) 05/31/2019 0857   HCT 44.2 04/09/2019 0918   PLT 273 05/31/2019 0857   PLT 401 04/09/2019 0918   MCV 95.8 05/31/2019 0857   MCV 99 (H) 04/09/2019 0918   NEUTROABS 7.0 05/31/2019 0857   NEUTROABS 5.9 04/09/2019 0918   LYMPHSABS 0.2 (L) 05/31/2019 0857   LYMPHSABS 1.3 04/09/2019 0918   MONOABS 0.7 05/31/2019 0857   EOSABS 0.1 05/31/2019 0857   EOSABS 0.2 04/09/2019 0918   BASOSABS 0.0 05/31/2019 0857   BASOSABS 0.1 04/09/2019 0918   Comprehensive Metabolic Panel:    Component Value Date/Time   NA 136 05/31/2019 0857   NA 138 04/09/2019 0918   K 3.3 (L) 05/31/2019 0857   CL 102 05/31/2019 0857   CO2 24 05/31/2019 0857   BUN 20 05/31/2019 0857   BUN 12 04/09/2019 0918   CREATININE 1.17 (H) 05/31/2019 0857   GLUCOSE 132 (H) 05/31/2019 0857   CALCIUM 8.6 (L) 05/31/2019 0857   AST 24 04/09/2019 0918   ALT 6 04/09/2019 0918   ALKPHOS 79 04/09/2019 0918   BILITOT 0.4 04/09/2019 0918   PROT 6.8 04/09/2019 0918   ALBUMIN 4.0 04/09/2019 0918    RADIOGRAPHIC STUDIES: Nm Pet Image Initial (pi) Skull Base To Thigh  Result Date: 05/07/2019 CLINICAL DATA:  Initial treatment strategy for cervical cancer. EXAM: NUCLEAR MEDICINE PET SKULL BASE TO THIGH TECHNIQUE: 6.5 mCi F-18 FDG was injected intravenously. Full-ring PET imaging was performed from the skull base to thigh after the radiotracer. CT data was obtained and used for attenuation correction and anatomic localization. Fasting blood glucose: 94 mg/dl COMPARISON:   05/01/2019 CT chest, abdomen and pelvis. FINDINGS: Mediastinal blood pool activity: SUV max 2.4 Liver activity: SUV max NA NECK: No hypermetabolic lymph nodes in the neck. Incidental CT findings: none CHEST: No hypermetabolic axillary, mediastinal or hilar lymph nodes. No hypermetabolic pulmonary findings. Incidental CT findings: No acute consolidative airspace disease or lung masses. A few scattered small solid pulmonary nodules in both lungs, largest 4 mm in the medial basilar left lower lobe (series 3/image 119), below PET resolution. ABDOMEN/PELVIS: Hypermetabolic bilateral retrocrural lymphadenopathy. Representative 1.0 cm left retrocrural node with max SUV 7.0 (series 3/image 127). Multiple enlarged hypermetabolic left para-aortic and aortocaval nodes. Representative 2.9 cm left para-aortic node with max SUV 12.8 (series 3/image 152). Representative 1.9 cm aortocaval node with max SUV 9.7 (series 3/image 158). Hypermetabolic bilateral common iliac,  right external iliac and right internal iliac lymphadenopathy. Representative 2.1 cm left common iliac node with max SUV 12.3 (series 3/image 186). Representative 1.8 cm right external iliac node with max SUV 16.5 (series 3/image 212). Representative 1.3 cm right internal iliac node with max SUV 13.1 (series 3/image 202). Hypermetabolic mass in the cervix with max SUV 19.1, poorly delineated on the noncontrast CT images. Hypermetabolism throughout the enlarged lower uterine segment and body of the uterus compatible with malignant involvement of the uterus with max SUV 14.7. Small foci of hypermetabolism at the periphery of the tampon within the anterior vagina without mass correlate on the CT images, favor urinary contamination. Hypermetabolic 6.1 x 3.4 cm soft tissue implant in the anterior left pelvis (series 3/image 216) surrounded by small volume pelvic ascites with max SUV 6.5 (series 3/image 216). No abnormal hypermetabolic activity within the liver, pancreas,  adrenal glands, or spleen. Incidental CT findings: Mildly atherosclerotic nonaneurysmal abdominal aorta. Mild right hydroureteronephrosis to the level of the upper pelvic right ureter. SKELETON: No focal hypermetabolic activity to suggest skeletal metastasis. No hypermetabolism associated with a mixed lytic and sclerotic upper left sacral lesion on series 3/image 192. Incidental CT findings: none IMPRESSION: 1. Hypermetabolic mass in the cervix compatible with known primary cervical malignancy. Hypermetabolism throughout the enlarged lower uterine segment and body of the uterus compatible with malignant involvement of the uterus. 2. Hypermetabolic right internal and external iliac, bilateral common iliac, aortocaval, left para-aortic and bilateral retrocrural lymphadenopathy compatible with metastatic nodal disease. 3. Hypermetabolic soft tissue implant in the anterior left pelvis surrounded by small volume pelvic ascites, favor pelvic peritoneal metastasis. 4. No hypermetabolic osseous metastatic disease. No hypermetabolism associated with the mixed lytic and sclerotic upper left sacral lesion, which is probably benign. 5. Tiny bilateral scattered solid pulmonary nodules, below PET resolution, indeterminate, recommend attention on close chest CT follow-up in 3 months. 6.  Aortic Atherosclerosis (ICD10-I70.0). Electronically Signed   By: Ilona Sorrel M.D.   On: 05/07/2019 16:02    PERFORMANCE STATUS (ECOG) : 1 - Symptomatic but completely ambulatory  Review of Systems Unless otherwise noted, a complete review of systems is negative.  Physical Exam General: NAD, frail appearing, thin Pulmonary: Unlabored Extremities: no edema Skin: no rashes Neurological: Weakness but otherwise nonfocal  IMPRESSION: I met with patient today while she received her infusion.  Patient reports doing reasonably well without any significant changes or concerns.  She reports not tolerating the Norco and did not like the  way it made her feel.  She has resumed taking meloxicam although we have previously discouraged it due to concerns about bleeding.  Patient denies any active bleeding.  We also discussed use of acetaminophen if needed for pain.  We again discussed advance care planning.  Patient says she is interested in completing a MOST Form but wants to speak to her son about decisions.  PLAN: -Continue current scope of treatment -ACP documents and most form reviewed -RTC in 4 weeks   Patient expressed understanding and was in agreement with this plan. She also understands that She can call the clinic at any time with any questions, concerns, or complaints.     Time Total: 15 minutes  Visit consisted of counseling and education dealing with the complex and emotionally intense issues of symptom management and palliative care in the setting of serious and potentially life-threatening illness.Greater than 50%  of this time was spent counseling and coordinating care related to the above assessment and plan.  Signed by:  Altha Harm, PhD, NP-C [phone] (Work Cell)

## 2019-05-31 NOTE — Progress Notes (Signed)
HR 80 on recheck.  Hgb 6.6 ok to proceed per md

## 2019-06-01 ENCOUNTER — Other Ambulatory Visit: Payer: Self-pay

## 2019-06-01 ENCOUNTER — Inpatient Hospital Stay: Payer: BC Managed Care – PPO

## 2019-06-01 ENCOUNTER — Ambulatory Visit
Admission: RE | Admit: 2019-06-01 | Discharge: 2019-06-01 | Disposition: A | Discharge status: Home / Self Care | Payer: BC Managed Care – PPO | Source: Ambulatory Visit | Attending: Radiation Oncology | Admitting: Radiation Oncology

## 2019-06-01 DIAGNOSIS — D649 Anemia, unspecified: Secondary | ICD-10-CM | POA: Diagnosis not present

## 2019-06-01 DIAGNOSIS — C539 Malignant neoplasm of cervix uteri, unspecified: Secondary | ICD-10-CM

## 2019-06-01 DIAGNOSIS — Z510 Encounter for antineoplastic radiation therapy: Secondary | ICD-10-CM | POA: Diagnosis not present

## 2019-06-01 DIAGNOSIS — C772 Secondary and unspecified malignant neoplasm of intra-abdominal lymph nodes: Secondary | ICD-10-CM | POA: Diagnosis not present

## 2019-06-01 DIAGNOSIS — N289 Disorder of kidney and ureter, unspecified: Secondary | ICD-10-CM | POA: Diagnosis not present

## 2019-06-01 DIAGNOSIS — C530 Malignant neoplasm of endocervix: Secondary | ICD-10-CM | POA: Diagnosis not present

## 2019-06-01 DIAGNOSIS — Z5111 Encounter for antineoplastic chemotherapy: Secondary | ICD-10-CM | POA: Diagnosis not present

## 2019-06-01 MED ORDER — DIPHENHYDRAMINE HCL 50 MG/ML IJ SOLN
25.0000 mg | Freq: Once | INTRAMUSCULAR | Status: AC
  Administered 2019-06-01: 11:00:00 25 mg via INTRAVENOUS
  Filled 2019-06-01: qty 1

## 2019-06-01 MED ORDER — ACETAMINOPHEN 325 MG PO TABS
650.0000 mg | ORAL_TABLET | Freq: Once | ORAL | Status: AC
  Administered 2019-06-01: 11:00:00 650 mg via ORAL
  Filled 2019-06-01: qty 2

## 2019-06-01 MED ORDER — SODIUM CHLORIDE 0.9% IV SOLUTION
250.0000 mL | Freq: Once | INTRAVENOUS | Status: AC
  Administered 2019-06-01: 250 mL via INTRAVENOUS
  Filled 2019-06-01: qty 250

## 2019-06-01 NOTE — Progress Notes (Signed)
Pt tolerated her first blood transfusion well, with no signs of reaction or complication. VSS prior and throughout transfusion. VSS prior to discharge. RN educated pt if complications arise at home that she needs to call 911 immediately. Pt verbalizes understanding and all questions answered at this time.   Azana Kiesler [employer]

## 2019-06-02 LAB — BPAM RBC
Blood Product Expiration Date: 202008132359
ISSUE DATE / TIME: 202007241132
Unit Type and Rh: 6200

## 2019-06-02 LAB — TYPE AND SCREEN
ABO/RH(D): A POS
Antibody Screen: NEGATIVE
Unit division: 0

## 2019-06-03 NOTE — Progress Notes (Deleted)
[place]  [phone] [phone]  ID: Mariah Mcmahon OB: 03-26-1966  MR#: [id number]  PJK#:[id number]  Patient Care Team: Venita Lick, NP as PCP - General (Nurse Practitioner) Clent Jacks, RN as Oncology Nurse Navigator  CHIEF COMPLAINT: Stage IIIb cervical cancer  INTERVAL HISTORY: Patient returns to clinic today for further evaluation and consideration of cycle 3 of weekly cisplatin.  She continues to have chronic weakness and fatigue, but otherwise feels well.  She states her abnormal vaginal bleeding has resolved.  She does not complain of abdominal pain or bloating today.  She continues to be anxious.  She has no neurologic complaints.  She denies any recent fevers or illnesses.  She has a good appetite and denies weight loss.  She has no chest pain, shortness of breath, cough, or hemoptysis.  She denies any nausea, vomiting, constipation, or diarrhea.  She has no urinary complaints.  Patient offers no further specific complaints today.  REVIEW OF SYSTEMS:   Review of Systems  Constitutional: Positive for malaise/fatigue. Negative for fever and weight loss.  Respiratory: Negative.  Negative for cough and shortness of breath.   Cardiovascular: Negative.  Negative for chest pain and leg swelling.  Gastrointestinal: Negative for abdominal pain.  Genitourinary: Negative.  Negative for hematuria.  Musculoskeletal: Negative.  Negative for back pain.  Skin: Negative.  Negative for rash.  Neurological: Positive for weakness. Negative for dizziness, focal weakness and headaches.  Psychiatric/Behavioral: The patient is nervous/anxious.     As per HPI. Otherwise, a complete review of systems is negative.  PAST MEDICAL HISTORY: Past Medical History:  Diagnosis Date   No pertinent past medical history     PAST SURGICAL HISTORY: Past Surgical History:  Procedure Laterality Date   MASS EXCISION     Throat   TONSILLECTOMY       FAMILY HISTORY: Family History  Problem Relation Age of Onset   Brain cancer Mother 31   Alcohol abuse Father    Heart disease Father    Prostate cancer Father    Heart attack Father    Diabetes Father     ADVANCED DIRECTIVES (Y/N):  N  HEALTH MAINTENANCE: Social History   Tobacco Use   Smoking status: Current Every Day Smoker    Packs/day: 0.50    Years: 30.00    Pack years: 15.00    Types: Cigarettes   Smokeless tobacco: Never Used  Substance Use Topics   Alcohol use: Not Currently   Drug use: Never     Colonoscopy:  PAP:  Bone density:  Lipid panel:  No Known Allergies  Current Outpatient Medications  Medication Sig Dispense Refill   cyclobenzaprine (FLEXERIL) 10 MG tablet Take 1 tablet by mouth 1 day or 1 dose.     HYDROcodone-acetaminophen (NORCO) 5-325 MG tablet Take 1 tablet by mouth every 6 (six) hours as needed for moderate pain. (Patient not taking: Reported on 05/31/2019) 45 tablet 0   meloxicam (MOBIC) 7.5 MG tablet Take 7.5 mg by mouth daily.     methocarbamol (ROBAXIN) 500 MG tablet Take 1 tablet (500 mg total) by mouth 2 (two) times daily as needed for muscle spasms (for lower back pain). 120 tablet 1   ondansetron (ZOFRAN) 8 MG tablet Take 1 tablet (8 mg total) by mouth 2 (two) times daily as needed. Start on the third day after chemotherapy. 30 tablet 2   prochlorperazine (COMPAZINE) 10 MG tablet Take 1 tablet (10 mg total) by mouth every  6 (six) hours as needed (Nausea or vomiting). 60 tablet 2   No current facility-administered medications for this visit.     OBJECTIVE: There were no vitals filed for this visit.   There is no height or weight on file to calculate BMI.    ECOG FS:0 - Asymptomatic  General: Thin, no acute distress. Eyes: Pink conjunctiva, anicteric sclera. HEENT: Normocephalic, moist mucous membranes. Lungs: Clear to auscultation bilaterally. Heart: Regular rate and rhythm. No rubs, murmurs, or  gallops. Abdomen: Soft, nontender, nondistended. No organomegaly noted, normoactive bowel sounds. Musculoskeletal: No edema, cyanosis, or clubbing. Neuro: Alert, answering all questions appropriately. Cranial nerves grossly intact. Skin: No rashes or petechiae noted. Psych: Normal affect.  LAB RESULTS:  Lab Results  Component Value Date   NA 136 05/31/2019   K 3.3 (L) 05/31/2019   CL 102 05/31/2019   CO2 24 05/31/2019   GLUCOSE 132 (H) 05/31/2019   BUN 20 05/31/2019   CREATININE 1.17 (H) 05/31/2019   CALCIUM 8.6 (L) 05/31/2019   PROT 6.8 04/09/2019   ALBUMIN 4.0 04/09/2019   AST 24 04/09/2019   ALT 6 04/09/2019   ALKPHOS 79 04/09/2019   BILITOT 0.4 04/09/2019   GFRNONAA 54 (L) 05/31/2019   GFRAA >60 05/31/2019    Lab Results  Component Value Date   WBC 8.1 05/31/2019   NEUTROABS 7.0 05/31/2019   HGB 6.6 (L) 05/31/2019   HCT 20.6 (L) 05/31/2019   MCV 95.8 05/31/2019   PLT 273 05/31/2019     STUDIES: Nm Pet Image Initial (pi) Skull Base To Thigh  Result Date: 05/07/2019 CLINICAL DATA:  Initial treatment strategy for cervical cancer. EXAM: NUCLEAR MEDICINE PET SKULL BASE TO THIGH TECHNIQUE: 6.5 mCi F-18 FDG was injected intravenously. Full-ring PET imaging was performed from the skull base to thigh after the radiotracer. CT data was obtained and used for attenuation correction and anatomic localization. Fasting blood glucose: 94 mg/dl COMPARISON:  05/01/2019 CT chest, abdomen and pelvis. FINDINGS: Mediastinal blood pool activity: SUV max 2.4 Liver activity: SUV max NA NECK: No hypermetabolic lymph nodes in the neck. Incidental CT findings: none CHEST: No hypermetabolic axillary, mediastinal or hilar lymph nodes. No hypermetabolic pulmonary findings. Incidental CT findings: No acute consolidative airspace disease or lung masses. A few scattered small solid pulmonary nodules in both lungs, largest 4 mm in the medial basilar left lower lobe (series 3/image 119), below PET  resolution. ABDOMEN/PELVIS: Hypermetabolic bilateral retrocrural lymphadenopathy. Representative 1.0 cm left retrocrural node with max SUV 7.0 (series 3/image 127). Multiple enlarged hypermetabolic left para-aortic and aortocaval nodes. Representative 2.9 cm left para-aortic node with max SUV 12.8 (series 3/image 152). Representative 1.9 cm aortocaval node with max SUV 9.7 (series 3/image 158). Hypermetabolic bilateral common iliac, right external iliac and right internal iliac lymphadenopathy. Representative 2.1 cm left common iliac node with max SUV 12.3 (series 3/image 186). Representative 1.8 cm right external iliac node with max SUV 16.5 (series 3/image 212). Representative 1.3 cm right internal iliac node with max SUV 13.1 (series 3/image 202). Hypermetabolic mass in the cervix with max SUV 19.1, poorly delineated on the noncontrast CT images. Hypermetabolism throughout the enlarged lower uterine segment and body of the uterus compatible with malignant involvement of the uterus with max SUV 14.7. Small foci of hypermetabolism at the periphery of the tampon within the anterior vagina without mass correlate on the CT images, favor urinary contamination. Hypermetabolic 6.1 x 3.4 cm soft tissue implant in the anterior left pelvis (series 3/image 216) surrounded by  small volume pelvic ascites with max SUV 6.5 (series 3/image 216). No abnormal hypermetabolic activity within the liver, pancreas, adrenal glands, or spleen. Incidental CT findings: Mildly atherosclerotic nonaneurysmal abdominal aorta. Mild right hydroureteronephrosis to the level of the upper pelvic right ureter. SKELETON: No focal hypermetabolic activity to suggest skeletal metastasis. No hypermetabolism associated with a mixed lytic and sclerotic upper left sacral lesion on series 3/image 192. Incidental CT findings: none IMPRESSION: 1. Hypermetabolic mass in the cervix compatible with known primary cervical malignancy. Hypermetabolism throughout the  enlarged lower uterine segment and body of the uterus compatible with malignant involvement of the uterus. 2. Hypermetabolic right internal and external iliac, bilateral common iliac, aortocaval, left para-aortic and bilateral retrocrural lymphadenopathy compatible with metastatic nodal disease. 3. Hypermetabolic soft tissue implant in the anterior left pelvis surrounded by small volume pelvic ascites, favor pelvic peritoneal metastasis. 4. No hypermetabolic osseous metastatic disease. No hypermetabolism associated with the mixed lytic and sclerotic upper left sacral lesion, which is probably benign. 5. Tiny bilateral scattered solid pulmonary nodules, below PET resolution, indeterminate, recommend attention on close chest CT follow-up in 3 months. 6.  Aortic Atherosclerosis (ICD10-I70.0). Electronically Signed   By: Ilona Sorrel M.D.   On: 05/07/2019 16:02    ASSESSMENT: Stage IIIb cervical cancer.  PLAN:    1.  Stage IIIb cervical cancer: Biopsy results from April 24, 2019 confirmed the diagnosis.  PET scan results from May 07, 2019 reviewed independently with extensive nodal disease as well as mass-effect causing possible hydronephrosis.  Case discussed with gynecology oncology as well as radiation oncology.  Patient is not a surgical candidate as of right now, but will likely benefit from daily XRT along with concurrent weekly cisplatin 40 mg/m2.  Plan to reimage after the conclusion of XRT to determine if brachii therapy is possible.  Will also considered rebiopsy with interventional radiology to obtain additional tissue for PDL-1 testing.  If there is still residual disease, she benefit from systemic treatment with carboplatinum/Taxol or cisplatin/Taxol plus or minus bevacizumab.  Continue daily XRT.  Proceed with cycle 3 of weekly cisplatin today.  Return to clinic in 1 week for further evaluation and consideration of cycle 4.   2.  Hydronephrosis: Seen on CT scan.  Monitor closely. 3.  Venous  access: Port placement was discussed, but patient would like to defer at this time. 4.  Renal insufficiency: Patient's creatinine is only mildly elevated at 1.17.  Monitor. 5.  Anemia: Patient's hemoglobin has trended down despite receiving IV iron this past Monday.  Patient reports her abnormal uterine bleeding has resolved.  Return to clinic tomorrow for 1 unit of packed red blood cells.  Proceed with treatment as above.  6.  Abdominal pain: Improved.  Continue hydrocodone as prescribed.  Patient also continues to use meloxicam sparingly.   Patient expressed understanding and was in agreement with this plan. She also understands that She can call clinic at any time with any questions, concerns, or complaints.   Cancer Staging Cervical cancer [facility]) Staging form: Cervix Uteri, AJCC 8th Edition - Clinical: FIGO Stage IIIB (cT3b, cN1, cM0) - Signed by Lloyd Huger, MD on 05/09/2019   Lloyd Huger, MD   06/03/2019 11:39 AM

## 2019-06-04 ENCOUNTER — Other Ambulatory Visit: Payer: Self-pay

## 2019-06-04 ENCOUNTER — Ambulatory Visit
Admission: RE | Admit: 2019-06-04 | Discharge: 2019-06-04 | Disposition: A | Discharge status: Home / Self Care | Payer: BC Managed Care – PPO | Source: Ambulatory Visit | Attending: Radiation Oncology | Admitting: Radiation Oncology

## 2019-06-04 DIAGNOSIS — I2699 Other pulmonary embolism without acute cor pulmonale: Secondary | ICD-10-CM | POA: Diagnosis not present

## 2019-06-04 DIAGNOSIS — I82422 Acute embolism and thrombosis of left iliac vein: Secondary | ICD-10-CM | POA: Diagnosis not present

## 2019-06-04 DIAGNOSIS — Z8249 Family history of ischemic heart disease and other diseases of the circulatory system: Secondary | ICD-10-CM | POA: Diagnosis not present

## 2019-06-04 DIAGNOSIS — C539 Malignant neoplasm of cervix uteri, unspecified: Secondary | ICD-10-CM | POA: Diagnosis not present

## 2019-06-04 DIAGNOSIS — E340 Carcinoid syndrome: Secondary | ICD-10-CM | POA: Diagnosis not present

## 2019-06-04 DIAGNOSIS — N1330 Unspecified hydronephrosis: Secondary | ICD-10-CM | POA: Diagnosis not present

## 2019-06-04 DIAGNOSIS — Z791 Long term (current) use of non-steroidal anti-inflammatories (NSAID): Secondary | ICD-10-CM | POA: Diagnosis not present

## 2019-06-04 DIAGNOSIS — Z833 Family history of diabetes mellitus: Secondary | ICD-10-CM | POA: Diagnosis not present

## 2019-06-04 DIAGNOSIS — R0902 Hypoxemia: Secondary | ICD-10-CM | POA: Diagnosis not present

## 2019-06-04 DIAGNOSIS — Z79899 Other long term (current) drug therapy: Secondary | ICD-10-CM | POA: Diagnosis not present

## 2019-06-04 DIAGNOSIS — I82402 Acute embolism and thrombosis of unspecified deep veins of left lower extremity: Secondary | ICD-10-CM | POA: Diagnosis not present

## 2019-06-04 DIAGNOSIS — I871 Compression of vein: Secondary | ICD-10-CM | POA: Diagnosis not present

## 2019-06-04 DIAGNOSIS — Z825 Family history of asthma and other chronic lower respiratory diseases: Secondary | ICD-10-CM | POA: Diagnosis not present

## 2019-06-04 DIAGNOSIS — Z923 Personal history of irradiation: Secondary | ICD-10-CM | POA: Diagnosis not present

## 2019-06-04 DIAGNOSIS — D62 Acute posthemorrhagic anemia: Secondary | ICD-10-CM | POA: Diagnosis not present

## 2019-06-04 DIAGNOSIS — G893 Neoplasm related pain (acute) (chronic): Secondary | ICD-10-CM | POA: Diagnosis not present

## 2019-06-04 DIAGNOSIS — I82409 Acute embolism and thrombosis of unspecified deep veins of unspecified lower extremity: Secondary | ICD-10-CM | POA: Diagnosis not present

## 2019-06-04 DIAGNOSIS — F17210 Nicotine dependence, cigarettes, uncomplicated: Secondary | ICD-10-CM | POA: Diagnosis not present

## 2019-06-04 DIAGNOSIS — D649 Anemia, unspecified: Secondary | ICD-10-CM | POA: Diagnosis not present

## 2019-06-04 DIAGNOSIS — I82412 Acute embolism and thrombosis of left femoral vein: Secondary | ICD-10-CM | POA: Diagnosis not present

## 2019-06-04 DIAGNOSIS — R2242 Localized swelling, mass and lump, left lower limb: Secondary | ICD-10-CM | POA: Diagnosis not present

## 2019-06-04 DIAGNOSIS — I2693 Single subsegmental pulmonary embolism without acute cor pulmonale: Secondary | ICD-10-CM | POA: Diagnosis not present

## 2019-06-04 DIAGNOSIS — R0602 Shortness of breath: Secondary | ICD-10-CM | POA: Diagnosis not present

## 2019-06-04 DIAGNOSIS — C772 Secondary and unspecified malignant neoplasm of intra-abdominal lymph nodes: Secondary | ICD-10-CM | POA: Diagnosis not present

## 2019-06-04 DIAGNOSIS — Z20828 Contact with and (suspected) exposure to other viral communicable diseases: Secondary | ICD-10-CM | POA: Diagnosis not present

## 2019-06-04 DIAGNOSIS — I82432 Acute embolism and thrombosis of left popliteal vein: Secondary | ICD-10-CM | POA: Diagnosis not present

## 2019-06-04 DIAGNOSIS — Z03818 Encounter for observation for suspected exposure to other biological agents ruled out: Secondary | ICD-10-CM | POA: Diagnosis not present

## 2019-06-04 DIAGNOSIS — C530 Malignant neoplasm of endocervix: Secondary | ICD-10-CM | POA: Diagnosis not present

## 2019-06-04 DIAGNOSIS — C786 Secondary malignant neoplasm of retroperitoneum and peritoneum: Secondary | ICD-10-CM | POA: Diagnosis not present

## 2019-06-05 ENCOUNTER — Other Ambulatory Visit: Payer: Self-pay

## 2019-06-05 ENCOUNTER — Encounter
Admission: EM | Disposition: A | Discharge status: Home / Self Care | Payer: Self-pay | Source: Home / Self Care | Attending: Specialist

## 2019-06-05 ENCOUNTER — Emergency Department: Payer: BC Managed Care – PPO

## 2019-06-05 ENCOUNTER — Other Ambulatory Visit: Payer: Self-pay | Admitting: *Deleted

## 2019-06-05 ENCOUNTER — Encounter: Payer: Self-pay | Admitting: Emergency Medicine

## 2019-06-05 ENCOUNTER — Other Ambulatory Visit (INDEPENDENT_AMBULATORY_CARE_PROVIDER_SITE_OTHER): Payer: Self-pay | Admitting: Vascular Surgery

## 2019-06-05 ENCOUNTER — Ambulatory Visit: Payer: BC Managed Care – PPO

## 2019-06-05 ENCOUNTER — Inpatient Hospital Stay: Payer: BC Managed Care – PPO

## 2019-06-05 ENCOUNTER — Inpatient Hospital Stay (HOSPITAL_BASED_OUTPATIENT_CLINIC_OR_DEPARTMENT_OTHER): Payer: BC Managed Care – PPO | Admitting: Oncology

## 2019-06-05 ENCOUNTER — Inpatient Hospital Stay
Admission: EM | Admit: 2019-06-05 | Discharge: 2019-06-07 | DRG: 270 | Disposition: A | Discharge status: Home / Self Care | Payer: BC Managed Care – PPO | Attending: Specialist | Admitting: Specialist

## 2019-06-05 DIAGNOSIS — C786 Secondary malignant neoplasm of retroperitoneum and peritoneum: Secondary | ICD-10-CM

## 2019-06-05 DIAGNOSIS — C530 Malignant neoplasm of endocervix: Secondary | ICD-10-CM | POA: Diagnosis not present

## 2019-06-05 DIAGNOSIS — I2699 Other pulmonary embolism without acute cor pulmonale: Secondary | ICD-10-CM | POA: Diagnosis present

## 2019-06-05 DIAGNOSIS — I82432 Acute embolism and thrombosis of left popliteal vein: Secondary | ICD-10-CM | POA: Diagnosis present

## 2019-06-05 DIAGNOSIS — E340 Carcinoid syndrome: Secondary | ICD-10-CM | POA: Diagnosis present

## 2019-06-05 DIAGNOSIS — I82409 Acute embolism and thrombosis of unspecified deep veins of unspecified lower extremity: Secondary | ICD-10-CM | POA: Diagnosis present

## 2019-06-05 DIAGNOSIS — N1330 Unspecified hydronephrosis: Secondary | ICD-10-CM | POA: Diagnosis present

## 2019-06-05 DIAGNOSIS — Z923 Personal history of irradiation: Secondary | ICD-10-CM

## 2019-06-05 DIAGNOSIS — C772 Secondary and unspecified malignant neoplasm of intra-abdominal lymph nodes: Secondary | ICD-10-CM | POA: Diagnosis present

## 2019-06-05 DIAGNOSIS — Z20828 Contact with and (suspected) exposure to other viral communicable diseases: Secondary | ICD-10-CM | POA: Diagnosis present

## 2019-06-05 DIAGNOSIS — Z833 Family history of diabetes mellitus: Secondary | ICD-10-CM | POA: Diagnosis not present

## 2019-06-05 DIAGNOSIS — I82402 Acute embolism and thrombosis of unspecified deep veins of left lower extremity: Secondary | ICD-10-CM

## 2019-06-05 DIAGNOSIS — I2693 Single subsegmental pulmonary embolism without acute cor pulmonale: Secondary | ICD-10-CM

## 2019-06-05 DIAGNOSIS — Z79899 Other long term (current) drug therapy: Secondary | ICD-10-CM

## 2019-06-05 DIAGNOSIS — Z825 Family history of asthma and other chronic lower respiratory diseases: Secondary | ICD-10-CM

## 2019-06-05 DIAGNOSIS — I82412 Acute embolism and thrombosis of left femoral vein: Secondary | ICD-10-CM

## 2019-06-05 DIAGNOSIS — G893 Neoplasm related pain (acute) (chronic): Secondary | ICD-10-CM | POA: Diagnosis present

## 2019-06-05 DIAGNOSIS — Z791 Long term (current) use of non-steroidal anti-inflammatories (NSAID): Secondary | ICD-10-CM | POA: Diagnosis not present

## 2019-06-05 DIAGNOSIS — R0602 Shortness of breath: Secondary | ICD-10-CM

## 2019-06-05 DIAGNOSIS — C539 Malignant neoplasm of cervix uteri, unspecified: Secondary | ICD-10-CM

## 2019-06-05 DIAGNOSIS — Z8249 Family history of ischemic heart disease and other diseases of the circulatory system: Secondary | ICD-10-CM | POA: Diagnosis not present

## 2019-06-05 DIAGNOSIS — D500 Iron deficiency anemia secondary to blood loss (chronic): Secondary | ICD-10-CM

## 2019-06-05 DIAGNOSIS — R2242 Localized swelling, mass and lump, left lower limb: Secondary | ICD-10-CM | POA: Diagnosis not present

## 2019-06-05 DIAGNOSIS — N131 Hydronephrosis with ureteral stricture, not elsewhere classified: Secondary | ICD-10-CM

## 2019-06-05 DIAGNOSIS — D62 Acute posthemorrhagic anemia: Secondary | ICD-10-CM | POA: Diagnosis present

## 2019-06-05 DIAGNOSIS — F17210 Nicotine dependence, cigarettes, uncomplicated: Secondary | ICD-10-CM | POA: Diagnosis present

## 2019-06-05 DIAGNOSIS — D649 Anemia, unspecified: Secondary | ICD-10-CM

## 2019-06-05 DIAGNOSIS — R0902 Hypoxemia: Secondary | ICD-10-CM | POA: Diagnosis present

## 2019-06-05 DIAGNOSIS — I82422 Acute embolism and thrombosis of left iliac vein: Secondary | ICD-10-CM | POA: Diagnosis present

## 2019-06-05 DIAGNOSIS — I871 Compression of vein: Secondary | ICD-10-CM | POA: Diagnosis present

## 2019-06-05 HISTORY — DX: Malignant (primary) neoplasm, unspecified: C80.1

## 2019-06-05 HISTORY — PX: PERIPHERAL VASCULAR THROMBECTOMY: CATH118306

## 2019-06-05 LAB — COMPREHENSIVE METABOLIC PANEL
ALT: 9 U/L (ref 0–44)
AST: 14 U/L — ABNORMAL LOW (ref 15–41)
Albumin: 2.9 g/dL — ABNORMAL LOW (ref 3.5–5.0)
Alkaline Phosphatase: 85 U/L (ref 38–126)
Anion gap: 10 (ref 5–15)
BUN: 19 mg/dL (ref 6–20)
CO2: 23 mmol/L (ref 22–32)
Calcium: 8.1 mg/dL — ABNORMAL LOW (ref 8.9–10.3)
Chloride: 102 mmol/L (ref 98–111)
Creatinine, Ser: 1.05 mg/dL — ABNORMAL HIGH (ref 0.44–1.00)
GFR calc Af Amer: >60 mL/min (ref >60)
GFR calc non Af Amer: >60 mL/min (ref >60)
Glucose, Bld: 97 mg/dL (ref 70–99)
Potassium: 3.7 mmol/L (ref 3.5–5.1)
Sodium: 135 mmol/L (ref 135–145)
Total Bilirubin: 0.4 mg/dL (ref 0.3–1.2)
Total Protein: 6.4 g/dL — ABNORMAL LOW (ref 6.5–8.1)

## 2019-06-05 LAB — CBC WITH DIFFERENTIAL/PLATELET
Abs Immature Granulocytes: 0.06 10*3/uL (ref 0.00–0.07)
Basophils Absolute: 0 10*3/uL (ref 0.0–0.1)
Basophils Relative: 0 %
Eosinophils Absolute: 0.2 10*3/uL (ref 0.0–0.5)
Eosinophils Relative: 3 %
HCT: 27.4 % — ABNORMAL LOW (ref 36.0–46.0)
Hemoglobin: 8.8 g/dL — ABNORMAL LOW (ref 12.0–15.0)
Immature Granulocytes: 1 %
Lymphocytes Relative: 4 %
Lymphs Abs: 0.3 10*3/uL — ABNORMAL LOW (ref 0.7–4.0)
MCH: 29.9 pg (ref 26.0–34.0)
MCHC: 32.1 g/dL (ref 30.0–36.0)
MCV: 93.2 fL (ref 80.0–100.0)
Monocytes Absolute: 0.6 10*3/uL (ref 0.1–1.0)
Monocytes Relative: 10 %
Neutro Abs: 5.2 10*3/uL (ref 1.7–7.7)
Neutrophils Relative %: 82 %
Platelets: 184 10*3/uL (ref 150–400)
RBC: 2.94 MIL/uL — ABNORMAL LOW (ref 3.87–5.11)
RDW: 16.3 % — ABNORMAL HIGH (ref 11.5–15.5)
WBC: 6.3 10*3/uL (ref 4.0–10.5)
nRBC: 0 % (ref 0.0–0.2)

## 2019-06-05 LAB — PROTIME-INR
INR: 1.1 (ref 0.8–1.2)
Prothrombin Time: 14.2 seconds (ref 11.4–15.2)

## 2019-06-05 LAB — HEPARIN LEVEL (UNFRACTIONATED): Heparin Unfractionated: <0.1 IU/mL — ABNORMAL LOW (ref 0.30–0.70)

## 2019-06-05 LAB — SARS CORONAVIRUS 2 BY RT PCR (HOSPITAL ORDER, PERFORMED IN ~~LOC~~ HOSPITAL LAB): SARS Coronavirus 2: NEGATIVE

## 2019-06-05 LAB — APTT: aPTT: 27 seconds (ref 24–36)

## 2019-06-05 SURGERY — PERIPHERAL VASCULAR THROMBECTOMY
Anesthesia: Moderate Sedation | Laterality: Left

## 2019-06-05 MED ORDER — SODIUM CHLORIDE 0.9 % IV SOLN
INTRAVENOUS | Status: DC
  Administered 2019-06-05: 16:00:00 via INTRAVENOUS

## 2019-06-05 MED ORDER — MELOXICAM 7.5 MG PO TABS
7.5000 mg | ORAL_TABLET | Freq: Once | ORAL | Status: DC
  Filled 2019-06-05: qty 1

## 2019-06-05 MED ORDER — FAMOTIDINE 20 MG PO TABS: 40.0000 mg | ORAL_TABLET | Freq: Once | ORAL | Status: DC | PRN

## 2019-06-05 MED ORDER — HYDROCODONE-ACETAMINOPHEN 5-325 MG PO TABS
1.0000 | ORAL_TABLET | Freq: Four times a day (QID) | ORAL | Status: DC | PRN
  Administered 2019-06-06 (×2): 1 via ORAL
  Filled 2019-06-05 (×2): qty 1

## 2019-06-05 MED ORDER — ALTEPLASE 2 MG IJ SOLR
INTRAMUSCULAR | Status: AC
  Filled 2019-06-05: qty 8

## 2019-06-05 MED ORDER — ALTEPLASE 2 MG IJ SOLR
INTRAMUSCULAR | Status: DC | PRN
  Administered 2019-06-05: 8 mg

## 2019-06-05 MED ORDER — HYDROMORPHONE HCL 1 MG/ML IJ SOLN
1.0000 mg | Freq: Once | INTRAMUSCULAR | Status: AC
  Administered 2019-06-05: 1 mg via INTRAVENOUS

## 2019-06-05 MED ORDER — MIDAZOLAM HCL 5 MG/5ML IJ SOLN
INTRAMUSCULAR | Status: AC
  Filled 2019-06-05: qty 5

## 2019-06-05 MED ORDER — MIDAZOLAM HCL 2 MG/2ML IJ SOLN
INTRAMUSCULAR | Status: DC | PRN
  Administered 2019-06-05 (×3): 2 mg via INTRAVENOUS
  Administered 2019-06-05 (×2): 1 mg via INTRAVENOUS

## 2019-06-05 MED ORDER — CEFAZOLIN SODIUM-DEXTROSE 2-4 GM/100ML-% IV SOLN
2.0000 g | Freq: Once | INTRAVENOUS | Status: AC
  Administered 2019-06-05: 2 g via INTRAVENOUS
  Filled 2019-06-05: qty 100

## 2019-06-05 MED ORDER — HEPARIN SODIUM (PORCINE) 1000 UNIT/ML IJ SOLN
INTRAMUSCULAR | Status: AC
  Filled 2019-06-05: qty 1

## 2019-06-05 MED ORDER — MIDAZOLAM HCL 2 MG/2ML IJ SOLN
INTRAMUSCULAR | Status: AC
  Filled 2019-06-05: qty 2

## 2019-06-05 MED ORDER — SODIUM CHLORIDE 0.9 % IV BOLUS
250.0000 mL | Freq: Once | INTRAVENOUS | Status: AC
  Administered 2019-06-05: 250 mL via INTRAVENOUS

## 2019-06-05 MED ORDER — ACETAMINOPHEN 325 MG PO TABS
ORAL_TABLET | ORAL | Status: AC
  Filled 2019-06-05: qty 2

## 2019-06-05 MED ORDER — FENTANYL CITRATE (PF) 100 MCG/2ML IJ SOLN
INTRAMUSCULAR | Status: DC | PRN
  Administered 2019-06-05 (×2): 50 ug via INTRAVENOUS
  Administered 2019-06-05 (×3): 25 ug via INTRAVENOUS

## 2019-06-05 MED ORDER — HYDROMORPHONE HCL 1 MG/ML IJ SOLN
1.0000 mg | Freq: Once | INTRAMUSCULAR | Status: AC | PRN
  Administered 2019-06-05: 0.5 mg via INTRAVENOUS

## 2019-06-05 MED ORDER — ONDANSETRON HCL 4 MG/2ML IJ SOLN: 4.0000 mg | Freq: Four times a day (QID) | INTRAMUSCULAR | Status: DC | PRN

## 2019-06-05 MED ORDER — MEPERIDINE HCL 25 MG/ML IJ SOLN
12.5000 mg | Freq: Once | INTRAMUSCULAR | Status: AC
  Administered 2019-06-05: 12.5 mg via INTRAVENOUS
  Filled 2019-06-05: qty 1

## 2019-06-05 MED ORDER — ONDANSETRON HCL 4 MG PO TABS: 4.0000 mg | ORAL_TABLET | Freq: Four times a day (QID) | ORAL | Status: DC | PRN

## 2019-06-05 MED ORDER — DIPHENHYDRAMINE HCL 50 MG/ML IJ SOLN: 50.0000 mg | Freq: Once | INTRAMUSCULAR | Status: DC | PRN

## 2019-06-05 MED ORDER — HEPARIN SODIUM (PORCINE) 1000 UNIT/ML IJ SOLN
INTRAMUSCULAR | Status: DC | PRN
  Administered 2019-06-05: 3000 [IU] via INTRAVENOUS

## 2019-06-05 MED ORDER — ACETAMINOPHEN 325 MG PO TABS
650.0000 mg | ORAL_TABLET | Freq: Four times a day (QID) | ORAL | Status: DC | PRN
  Administered 2019-06-05 – 2019-06-07 (×3): 650 mg via ORAL
  Filled 2019-06-05 (×2): qty 2

## 2019-06-05 MED ORDER — SODIUM CHLORIDE 0.9 % IV SOLN
1000.0000 mL | Freq: Once | INTRAVENOUS | Status: AC
  Administered 2019-06-05: 1000 mL via INTRAVENOUS

## 2019-06-05 MED ORDER — FENTANYL CITRATE (PF) 100 MCG/2ML IJ SOLN
INTRAMUSCULAR | Status: AC
  Filled 2019-06-05: qty 2

## 2019-06-05 MED ORDER — METHOCARBAMOL 500 MG PO TABS
500.0000 mg | ORAL_TABLET | Freq: Two times a day (BID) | ORAL | Status: DC | PRN
  Administered 2019-06-05 – 2019-06-07 (×4): 500 mg via ORAL
  Filled 2019-06-05 (×7): qty 1

## 2019-06-05 MED ORDER — METHYLPREDNISOLONE SODIUM SUCC 125 MG IJ SOLR: 125.0000 mg | Freq: Once | INTRAMUSCULAR | Status: DC | PRN

## 2019-06-05 MED ORDER — HEPARIN BOLUS VIA INFUSION
3000.0000 [IU] | Freq: Once | INTRAVENOUS | Status: AC
  Administered 2019-06-05: 15:00:00 3000 [IU] via INTRAVENOUS
  Filled 2019-06-05: qty 3000

## 2019-06-05 MED ORDER — HEPARIN (PORCINE) 25000 UT/250ML-% IV SOLN
1400.0000 [IU]/h | INTRAVENOUS | Status: DC
  Administered 2019-06-05: 850 [IU]/h via INTRAVENOUS
  Administered 2019-06-06: 1200 [IU]/h via INTRAVENOUS
  Filled 2019-06-05 (×2): qty 250

## 2019-06-05 MED ORDER — HEPARIN BOLUS VIA INFUSION
1600.0000 [IU] | Freq: Once | INTRAVENOUS | Status: AC
  Administered 2019-06-05: 23:00:00 1600 [IU] via INTRAVENOUS
  Filled 2019-06-05: qty 1600

## 2019-06-05 MED ORDER — CEFAZOLIN SODIUM-DEXTROSE 1-4 GM/50ML-% IV SOLN
1.0000 g | INTRAVENOUS | Status: DC
  Filled 2019-06-05 (×2): qty 50

## 2019-06-05 MED ORDER — CEFAZOLIN SODIUM-DEXTROSE 2-4 GM/100ML-% IV SOLN
INTRAVENOUS | Status: AC
  Filled 2019-06-05: qty 100

## 2019-06-05 MED ORDER — MORPHINE SULFATE (PF) 4 MG/ML IV SOLN
2.0000 mg | INTRAVENOUS | Status: DC | PRN
  Administered 2019-06-06 (×2): 2 mg via INTRAVENOUS
  Filled 2019-06-05 (×2): qty 1

## 2019-06-05 MED ORDER — HYDROMORPHONE HCL 1 MG/ML IJ SOLN
INTRAMUSCULAR | Status: AC
  Administered 2019-06-05: 0.5 mg via INTRAVENOUS
  Filled 2019-06-05: qty 1

## 2019-06-05 MED ORDER — IODIXANOL 320 MG/ML IV SOLN
INTRAVENOUS | Status: DC | PRN
  Administered 2019-06-05: 19:00:00 95 mL via INTRAVENOUS

## 2019-06-05 MED ORDER — SENNOSIDES-DOCUSATE SODIUM 8.6-50 MG PO TABS: 1.0000 | ORAL_TABLET | Freq: Every evening | ORAL | Status: DC | PRN

## 2019-06-05 MED ORDER — HYDROMORPHONE HCL 1 MG/ML IJ SOLN
INTRAMUSCULAR | Status: AC
  Filled 2019-06-05: qty 1

## 2019-06-05 MED ORDER — CYCLOBENZAPRINE HCL 10 MG PO TABS: 10.0000 mg | ORAL_TABLET | ORAL | Status: DC

## 2019-06-05 MED ORDER — MIDAZOLAM HCL 2 MG/ML PO SYRP: 8.0000 mg | ORAL_SOLUTION | Freq: Once | ORAL | Status: DC | PRN

## 2019-06-05 MED ORDER — IOHEXOL 350 MG/ML SOLN
60.0000 mL | Freq: Once | INTRAVENOUS | Status: AC | PRN
  Administered 2019-06-05: 16:00:00 60 mL via INTRAVENOUS

## 2019-06-05 SURGICAL SUPPLY — 28 items
BALLN DORADO 8X100X80 (BALLOONS) ×3
BALLN LUTONIX AV 12X40X75 (BALLOONS) ×6
BALLOON DORADO 8X100X80 (BALLOONS) IMPLANT
BALLOON LUTONIX AV 12X40X75 (BALLOONS) IMPLANT
CANISTER PENUMBRA ENGINE (MISCELLANEOUS) ×2 IMPLANT
CATH BEACON 5 .035 65 KMP TIP (CATHETERS) ×2 IMPLANT
CATH BEACON 5 .038 100 VERT TP (CATHETERS) ×2 IMPLANT
CATH INDIGO 12XTORQ 100 (CATHETERS) ×2 IMPLANT
CATH INFUS 90CMX50CM (CATHETERS) ×2
CATH INFUS UNIFUSE 90X50 5FR (CATHETERS) IMPLANT
DEVICE PRESTO INFLATION (MISCELLANEOUS) ×2 IMPLANT
DEVICE SAFEGUARD 24CM (GAUZE/BANDAGES/DRESSINGS) ×2 IMPLANT
DEVICE TORQUE .025-.038 (MISCELLANEOUS) ×2 IMPLANT
DRAPE FEMORAL ANGIO W/ POUCH (DRAPES) ×2 IMPLANT
INTRODUCER PERFORM 12 30 .038 (SHEATH) ×2 IMPLANT
KIT CATH CVC 3 LUMEN 7FR 8IN (MISCELLANEOUS) ×2 IMPLANT
KIT FEMORAL DEL DENALI (Miscellaneous) ×2 IMPLANT
NDL ENTRY 21GA 7CM ECHOTIP (NEEDLE) IMPLANT
NEEDLE ENTRY 21GA 7CM ECHOTIP (NEEDLE) ×3 IMPLANT
PACK ANGIOGRAPHY (CUSTOM PROCEDURE TRAY) ×3 IMPLANT
SET INTRO CAPELLA COAXIAL (SET/KITS/TRAYS/PACK) ×2 IMPLANT
SHIELD RADPAD DADD DRAPE 4X9 (MISCELLANEOUS) ×2 IMPLANT
STENT VENOVO 14X60X80 (Permanent Stent) ×2 IMPLANT
SUT SILK 0 FSL (SUTURE) ×2 IMPLANT
WIRE AQUATRACK .035X260CM (WIRE) ×2 IMPLANT
WIRE J 3MM .035X145CM (WIRE) ×2 IMPLANT
WIRE MAGIC TOR.035 180C (WIRE) ×2 IMPLANT
WIRE MAGIC TORQUE 260C (WIRE) ×2 IMPLANT

## 2019-06-05 NOTE — ED Notes (Signed)
Phlebotomist unable to get blood. Reports she is sending another phlebotomist to try for blood draw

## 2019-06-05 NOTE — Op Note (Signed)
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 Ultrasound guidance for vascular access to the left common femoral vein and left popliteal vein 2. Catheter placement into the inferior vena cava for placement of IVC filter 3. Inferior venacavogram 4. Placement of a Denali IVC filter infrarenal L2 level 5. Introduction catheter into venous system second order catheter placement left popliteal approach 6. Infusion thrombolysis with 8 mg of TPA 7. Mechanical thrombectomy of the left popliteal, SFV, common femoral, external iliac and common iliac vein using the penumbra CAT 12 catheter 8.  Percutaneous transluminal angioplasty and stent placement using a 14 x 60 the Novo stent postdilated to 12 mm with Lutonix drug-eluting balloon left common iliac 9.  Placement of a right IJ triple-lumen catheter with ultrasound guidance  SURGEON: Hortencia Pilar  ASSISTANT(S): None  ANESTHESIA: Conscious sedation was administered by the interventional radiology RN under my direct supervision. IV Versed plus fentanyl were utilized. Continuous ECG, pulse oximetry and blood pressure was monitored throughout the entire procedure.  Conscious sedation was administered for a total of 135 minutes.  ESTIMATED BLOOD LOSS: minimal  Contrast:95 cc  Fluoroscopy time:14.5 minutes  FINDING(S): 1. Patent IVC; thrombus within the left popliteal, superficial femoral vein and common femoral vein as well as the external and common iliac veins on the left.  Following thrombectomy a greater than 90% stricture of the left common iliac  vein is identified.  SPECIMEN(S): none  INDICATIONS:  Mariah Mcmahon is a 53 y.o. year old female who presents with massive swelling of the left leg quite painful in association with pleuritic chest pains and hypoxia as well as shortness of breath. Inferior vena cava filter is indicated for this reason. Risks and benefits including filter thrombosis, migration, fracture, bleeding, and infection were all discussed. We discussed that all IVC filters that we place can be removed if desired from the patient once the need for the filter has passed.   DESCRIPTION: After obtaining full informed written consent, the patient was brought back to the vascular suite. The skin was sterilely prepped and draped in a sterile surgical field was created.  Ultrasound was placed in a sterile sleeve.  The left common femoral vein was image with the ultrasound.  It was echolucent and compressible indicating patency.  Image was recorded for the permanent record. The left common femoral vein was accessed under direct ultrasound guidance without difficulty with a micropuncture needle and a microwire was advanced without difficulty.   A microsheath was  then inserted and then a J-wire was then placed. The dilator is passed over the wire and the delivery sheath was placed into the inferior vena cava. Inferior venacavogram was performed. This demonstrated a patent IVC with the level of the renal veins at L1-L2. The [facility] filter was then deployed into the inferior vena cava at the level of the inferior margin of L2 just below the renal veins. The delivery sheath was then removed. Pressure was held. Sterile dressings were placed. The patient tolerated the procedure well.  The patient's right neck was then prepped and draped in sterile fashion.  New gown and gloves were donned.  Ultrasound was placed in a new sterile sleeve and used to evaluate the right neck.  Internal jugular vein was echolucent and compressible indicating  patency.  1% lidocaine was infiltrated into the soft tissues.  The right internal jugular vein was accessed with a Seldinger needle.  J-wire was then advanced without difficulty.  The dilator was advanced over the wire and the triple-lumen catheter passed over the wire without difficulty.  All 3 lm were aspirated and flushed.  Catheter was then secured to the neck with 2-0 silk sutures.  Evaluation under fluoroscopic guidance showed the tip of the catheter is well within the right atrium extending toward the ventricle.  The catheter was repositioned under fluoroscopic guidance by cutting the silk sutures and then re-securing the catheter after the pimple back to the atrial caval junction verified under fluoroscopy.  Sterile dressing with a Biopatch was then applied.  The patient was then repositioned to the prone and the popliteal fossa of the left leg was prepped and draped in a sterile fashion. Ultrasound was placed in a sterile sleeve. Ultrasound is utilized to gain access to the popliteal vein. Vein is known to have thrombus within it by previous ultrasound. It is therefore identified by its enlarged size with heterogenous thrombus and non-compressibility. 1% lidocaine is infiltrated in soft tissues and subsequent microneedle is inserted into the popliteal vein without difficulty. Images recorded for the permanent record and the puncture is made under real-time visualization. Microwire is then advanced under fluoroscopic guidance and noted to follow the path of the superficial femoral vein. Therefore the micro-sheath was placed followed by a Magic torque wire and subsequently an 12 [place] sheath.  KMP catheter together with the Magic torque wire then advanced up to the level of the common iliac and hand injection contrast is utilized to demonstrate that the common iliac vein on the left is occluded with thrombus visualized in the external iliac vein.  The inferior vena cava is known to be patent from the  above filter placement. Hand injection contrast demonstrates that there is a large burden of occlusive thrombus within the common femoral superficial femoral and popliteal veins.  Using a Glidewire and the Kumpe catheter the occlusion in the left common iliac vein is crossed verifying by hand-injection that we are within the inferior vena cava.  Magic torque wire was then inserted.  An infusion catheter with a 50 cm infusion length is then prepped on the field and 8 mg of TPA is reconstituted 25 cc. This is then laced throughout the left lower extremity venous system.  The TPA is then allowed to dwell. Subsequently, the penumbra CAT 12 catheter is engaged in the aspiration mode and aspiration of the common iliac vein as well as the external iliac vein and the common femoral as well as the popliteal and superficial femoral veins is performed 2  passes are made one being pulled back from the vena cava and one being advanced upward from the sheath.    Follow-up imaging now demonstrates that essentially all the thrombus is been eliminated from the left leg venous system. There does not appear to be any abnormality of the vein such as a stricture or stenosis within the popliteal superficial femoral or common veins. Within the common iliac vein there is a high-grade residual narrowing and therefore initially a 10 x 100 [place] balloon was inflated to 16 atm for 1 minute and subsequently follow-up imaging demonstrates greater than 50% residual stenosis and a 14 x 60 the Novo stent was deployed across the common iliac and postdilated with 212 x 40 Lutonix drug-eluting balloons.  Follow-up imaging now demonstrates wide patency with less than 10% residual stenosis of the left common iliac.  Image of the IVC and filter demonstrates that the IVC and the filter remained widely patent with no evidence of thrombus within the filter. Given that the patient now is widely patent with minimal residual thrombus from the distal  popliteal to the IVC the procedure is terminated. The wires removed the sheath is removed pressures held and a pressure dressing with Coban and is then applied. The patient is then returned to the supine position  Interpretation: Initial images demonstrated normal vena cava filter is placed without difficulty. Initial images the left lower extremity then demonstrated extensive thrombus throughout the popliteal and femoral veins and the iliac veins remained widely patent. Following intervention described above there is near-total resolution of thrombus throughout the left leg venous system.  This uncovers a subtotal occlusion of the common iliac which is subsequently stented yielding a widely patent vein with less than 10% residual stenosis..  COMPLICATIONS: None  CONDITION: Stable  Hortencia Pilar  06/05/2019,6:49 PM

## 2019-06-05 NOTE — Consult Note (Signed)
[facility] V[facility] Consult Note  MRN : [id number]  Mariah Mcmahon is a 53 y.o. (06-20-1966) female who presents with chief complaint of  Chief Complaint  Patient presents with  . 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  The patient endorses a history of an acute intense pain located in her left lower extremity that started this morning.  This pain is associated with swelling.  The patient feels like her leg is going to "burst".  The pain radiates down the left leg.  The patient denies any recent surgery or trauma, travel/long periods of immobilization or history of DVT/PE in the past.  The patient was noted to be "hyperventilating" by the triage nurse earlier today however at this time, the patient denies any shortness of breath or chest pain.  Patient denies any fever, nausea vomiting.  06/05/19 Venous Duplex:  Examination is positive for extensive acute occlusive DVT extending from the left common femoral vein through the left popliteal veins.  Vascular Surgery was consulted by Dr. Corky Downs for further recommendations.  Current Facility-Administered Medications  Medication Dose Route Frequency Provider Last Rate Last Dose  . cyclobenzaprine (FLEXERIL) tablet 10 mg  10 mg Oral 1 day or 1 dose Gouru, Aruna, MD      . heparin bolus via infusion 3,000 Units  3,000 Units Intravenous Once Gouru, Aruna, MD       Followed by  . heparin ADULT infusion 100 units/mL (25000 units/211mL sodium chloride 0.45%)  850 Units/hr Intravenous Continuous Gouru, Aruna, MD      . HYDROcodone-acetaminophen (NORCO/VICODIN) 5-325 MG per tablet 1 tablet  1 tablet Oral Q6H PRN Gouru, Aruna, MD      . methocarbamol  (ROBAXIN) tablet 500 mg  500 mg Oral BID PRN Gouru, Aruna, MD      . ondansetron (ZOFRAN) tablet 4 mg  4 mg Oral Q6H PRN Gouru, Aruna, MD       Or  . ondansetron (ZOFRAN) injection 4 mg  4 mg Intravenous Q6H PRN Gouru, Aruna, MD      . senna-docusate (Senokot-S) tablet 1 tablet  1 tablet Oral QHS PRN Gouru, Aruna, MD       Current Outpatient Medications  Medication Sig Dispense Refill  . meloxicam (MOBIC) 7.5 MG tablet Take 7.5 mg by mouth daily.    . methocarbamol (ROBAXIN) 500 MG tablet Take 1 tablet (500 mg total) by mouth 2 (two) times daily as needed for muscle spasms (for lower back pain). 120 tablet 1  . cyclobenzaprine (FLEXERIL) 10 MG tablet Take 1 tablet by mouth 1 day or 1 dose.    Marland Kitchen HYDROcodone-acetaminophen (NORCO) 5-325 MG tablet Take 1 tablet by mouth every 6 (six) hours as needed for moderate pain. (Patient not taking: Reported on 05/31/2019) 45 tablet 0  . ondansetron (ZOFRAN) 8 MG tablet Take 1 tablet (8 mg total) by mouth 2 (two) times daily as needed. Start on the third day after chemotherapy. 30 tablet 2  . prochlorperazine (COMPAZINE) 10 MG tablet Take 1 tablet (10 mg total) by mouth every 6 (six) hours as needed (Nausea or vomiting). (Patient not taking: Reported on 06/05/2019) 60 tablet 2   Past Medical History:  Diagnosis Date  . Cancer ([place])  cwervical cancer  . No pertinent past medical history    Past Surgical History:  Procedure Laterality Date  . MASS EXCISION     Throat  . TONSILLECTOMY     Social History Social History   Tobacco Use  . Smoking status: Current Every Day Smoker    Packs/day: 0.50    Years: 30.00    Pack years: 15.00    Types: Cigarettes  . Smokeless tobacco: Never Used  Substance Use Topics  . Alcohol use: Not Currently  . Drug use: Never   Family History Family History  Problem Relation Age of Onset  . Brain cancer Mother 74  . Alcohol abuse Father   . Heart disease Father   . Prostate cancer Father   . Heart attack  Father   . Diabetes Father   Denies family history of peripheral artery disease, venous disease and bleeding/clotting disorders.  No Known Allergies  REVIEW OF SYSTEMS (Negative unless checked)  Constitutional: [] Weight loss  [] Fever  [] Chills Cardiac: [] Chest pain   [] Chest pressure   [] Palpitations   [] Shortness of breath when laying flat   [] Shortness of breath at rest   [] Shortness of breath with exertion. Vascular:  [x] Pain in legs with walking   [x] Pain in legs at rest   [x] Pain in legs when laying flat   [] Claudication   [] Pain in feet when walking  [] Pain in feet at rest  [] Pain in feet when laying flat   [] History of DVT   [] Phlebitis   [x] Swelling in legs   [] Varicose veins   [] Non-healing ulcers Pulmonary:   [] Uses home oxygen   [] Productive cough   [] Hemoptysis   [] Wheeze  [] COPD   [] Asthma Neurologic:  [] Dizziness  [] Blackouts   [] Seizures   [] History of stroke   [] History of TIA  [] Aphasia   [] Temporary blindness   [] Dysphagia   [] Weakness or numbness in arms   [] Weakness or numbness in legs Musculoskeletal:  [] Arthritis   [] Joint swelling   [] Joint pain   [] Low back pain Hematologic:  [] Easy bruising  [] Easy bleeding   [] Hypercoagulable state   [] Anemic  [] Hepatitis Gastrointestinal:  [] Blood in stool   [] Vomiting blood  [] Gastroesophageal reflux/heartburn   [] Difficulty swallowing. Genitourinary:  [] Chronic kidney disease   [] Difficult urination  [] Frequent urination  [] Burning with urination   [] Blood in urine Skin:  [] Rashes   [] Ulcers   [] Wounds Psychological:  [] History of anxiety   []  History of major depression.  Physical Examination  Vitals:   06/05/19 1209 06/05/19 1228 06/05/19 1251 06/05/19 1300  BP:    106/71  Pulse: 84 75 73 76  Resp: 20 20 17  (!) 22  Temp:      TempSrc:      SpO2: 100% 100% 100% 100%  Weight:      Height:       Body mass index is 20.67 kg/m. Gen:  WD/WN, NAD - on room air Head: [place]/AT, No temporalis wasting. Prominent temp pulse not  noted. Ear/Nose/Throat: Hearing grossly intact, nares w/o erythema or drainage, oropharynx w/o Erythema/Exudate Eyes: Sclera non-icteric, conjunctiva clear Neck: Trachea midline.  No JVD.  Pulmonary:  Good air movement, respirations not labored, equal bilaterally.  Cardiac: RRR, normal S1, S2. Vascular:  Vessel Right Left  Radial Palpable Palpable  Ulnar Palpable Palpable  Brachial Palpable Palpable  Carotid Palpable, without bruit Palpable, without bruit  Aorta Not palpable N/A  Femoral Palpable Palpable  Popliteal Palpable Palpable  PT Palpable Palpable  DP Palpable Palpable  Left Lower Extremity: Thigh soft, calf soft. Extremity is moderately swollen from the thigh distally. Palpable pedal pulses. No pain with dorsiflexion. No acute vascular compromise to the left lower extremity at this time. Motor / sensory intact.   Gastrointestinal: soft, non-tender/non-distended. No guarding/reflex.  Musculoskeletal: M/S 5/5 throughout.  Extremities without ischemic changes.  No deformity or atrophy.  Neurologic: Sensation grossly intact in extremities.  Symmetrical.  Speech is fluent. Motor exam as listed above. Psychiatric: Judgment intact, Mood & affect appropriate for pt's clinical situation. Dermatologic: No rashes or ulcers noted.  No cellulitis or open wounds. Lymph : No Cervical, Axillary, or Inguinal lymphadenopathy.  CBC Lab Results  Component Value Date   WBC 6.3 06/05/2019   HGB 8.8 (L) 06/05/2019   HCT 27.4 (L) 06/05/2019   MCV 93.2 06/05/2019   PLT 184 06/05/2019   BMET    Component Value Date/Time   NA 136 05/31/2019 0857   NA 138 04/09/2019 0918   K 3.3 (L) 05/31/2019 0857   CL 102 05/31/2019 0857   CO2 24 05/31/2019 0857   GLUCOSE 132 (H) 05/31/2019 0857   BUN 20 05/31/2019 0857   BUN 12 04/09/2019 0918   CREATININE 1.17 (H) 05/31/2019 0857   CALCIUM 8.6 (L) 05/31/2019 0857   GFRNONAA 54 (L) 05/31/2019 0857   GFRAA >60 05/31/2019 0857   Estimated  Creatinine Clearance: 45.5 mL/min (A) (by C-G formula based on SCr of 1.17 mg/dL (H)).  COAG No results found for: INR, PROTIME  Radiology Dg Chest 1 View  Result Date: 06/05/2019 CLINICAL DATA:  Shortness of breath and leg swelling. EXAM: CHEST  1 VIEW COMPARISON:  Chest CT 05/01/2019 FINDINGS: The cardiac silhouette, mediastinal and hilar contours are within normal limits and stable. The lungs are clear of an acute process. No infiltrates, edema or effusions. No worrisome pulmonary lesions. The bony thorax is intact. IMPRESSION: No acute cardiopulmonary findings and no worrisome pulmonary lesions. Electronically Signed   By: Marijo Sanes M.D.   On: 06/05/2019 11:39   Nm Pet Image Initial (pi) Skull Base To Thigh  Result Date: 05/07/2019 CLINICAL DATA:  Initial treatment strategy for cervical cancer. EXAM: NUCLEAR MEDICINE PET SKULL BASE TO THIGH TECHNIQUE: 6.5 mCi F-18 FDG was injected intravenously. Full-ring PET imaging was performed from the skull base to thigh after the radiotracer. CT data was obtained and used for attenuation correction and anatomic localization. Fasting blood glucose: 94 mg/dl COMPARISON:  05/01/2019 CT chest, abdomen and pelvis. FINDINGS: Mediastinal blood pool activity: SUV max 2.4 Liver activity: SUV max NA NECK: No hypermetabolic lymph nodes in the neck. Incidental CT findings: none CHEST: No hypermetabolic axillary, mediastinal or hilar lymph nodes. No hypermetabolic pulmonary findings. Incidental CT findings: No acute consolidative airspace disease or lung masses. A few scattered small solid pulmonary nodules in both lungs, largest 4 mm in the medial basilar left lower lobe (series 3/image 119), below PET resolution. ABDOMEN/PELVIS: Hypermetabolic bilateral retrocrural lymphadenopathy. Representative 1.0 cm left retrocrural node with max SUV 7.0 (series 3/image 127). Multiple enlarged hypermetabolic left para-aortic and aortocaval nodes. Representative 2.9 cm left  para-aortic node with max SUV 12.8 (series 3/image 152). Representative 1.9 cm aortocaval node with max SUV 9.7 (series 3/image 158). Hypermetabolic bilateral common iliac, right external iliac and right internal iliac lymphadenopathy. Representative 2.1 cm left common iliac node with max SUV 12.3 (series 3/image 186). Representative 1.8 cm right external iliac node with max SUV 16.5 (series 3/image 212). Representative 1.3 cm right internal iliac node with  max SUV 13.1 (series 3/image 202). Hypermetabolic mass in the cervix with max SUV 19.1, poorly delineated on the noncontrast CT images. Hypermetabolism throughout the enlarged lower uterine segment and body of the uterus compatible with malignant involvement of the uterus with max SUV 14.7. Small foci of hypermetabolism at the periphery of the tampon within the anterior vagina without mass correlate on the CT images, favor urinary contamination. Hypermetabolic 6.1 x 3.4 cm soft tissue implant in the anterior left pelvis (series 3/image 216) surrounded by small volume pelvic ascites with max SUV 6.5 (series 3/image 216). No abnormal hypermetabolic activity within the liver, pancreas, adrenal glands, or spleen. Incidental CT findings: Mildly atherosclerotic nonaneurysmal abdominal aorta. Mild right hydroureteronephrosis to the level of the upper pelvic right ureter. SKELETON: No focal hypermetabolic activity to suggest skeletal metastasis. No hypermetabolism associated with a mixed lytic and sclerotic upper left sacral lesion on series 3/image 192. Incidental CT findings: none IMPRESSION: 1. Hypermetabolic mass in the cervix compatible with known primary cervical malignancy. Hypermetabolism throughout the enlarged lower uterine segment and body of the uterus compatible with malignant involvement of the uterus. 2. Hypermetabolic right internal and external iliac, bilateral common iliac, aortocaval, left para-aortic and bilateral retrocrural lymphadenopathy  compatible with metastatic nodal disease. 3. Hypermetabolic soft tissue implant in the anterior left pelvis surrounded by small volume pelvic ascites, favor pelvic peritoneal metastasis. 4. No hypermetabolic osseous metastatic disease. No hypermetabolism associated with the mixed lytic and sclerotic upper left sacral lesion, which is probably benign. 5. Tiny bilateral scattered solid pulmonary nodules, below PET resolution, indeterminate, recommend attention on close chest CT follow-up in 3 months. 6.  Aortic Atherosclerosis (ICD10-I70.0). Electronically Signed   By: Ilona Sorrel M.D.   On: 05/07/2019 16:02   US Venous Img Lower Unilateral Left  Result Date: 06/05/2019 CLINICAL DATA:  Left lower extremity edema. History of smoking and malignancy. Evaluate for DVT. EXAM: LEFT LOWER EXTREMITY VENOUS DOPPLER ULTRASOUND TECHNIQUE: Gray-scale sonography with graded compression, as well as color Doppler and duplex ultrasound were performed to evaluate the lower extremity deep venous systems from the level of the common femoral vein and including the common femoral, femoral, profunda femoral, popliteal and calf veins including the posterior tibial, peroneal and gastrocnemius veins when visible. The superficial great saphenous vein was also interrogated. Spectral Doppler was utilized to evaluate flow at rest and with distal augmentation maneuvers in the common femoral, femoral and popliteal veins. COMPARISON:  None. FINDINGS: Contralateral Common Femoral Vein: Respiratory phasicity is normal and symmetric with the symptomatic side. No evidence of thrombus. Normal compressibility. There is mixed echogenic expansile occlusive thrombus extending from the left common femoral vein (image 8) through the saphenofemoral junction and proximal aspect of the greater saphenous vein (image 12) as well as the imaged aspects of the left deep femoral vein (image 15). There is hypoechoic occlusive thrombus involving the proximal (image  19), mid (image 22) and distal (image 26) aspects of the left femoral vein, extending to involve the proximal (image 30) and distal (image 34) aspects of the popliteal vein as well as both paired left posterior tibial and peroneal veins. Other Findings:  None. IMPRESSION: Examination is positive for extensive acute occlusive DVT extending from the left common femoral vein through the left popliteal veins. Electronically Signed   By: Sandi Mariscal M.D.   On: 06/05/2019 12:20   Assessment/Plan The patient is a 53 year old female with a past medical history of active tobacco abuse with known history of stage IIIb squamous cell  cancer of the endocervix with possible metastases to lymph nodes, lungs and sacrum currently receiving chemotherapy and radiation.  Found to have an extensive left lower extremity DVT 1.  Left lower extremity DVT: Patient with an acute occlusive extensive DVT from the left common femoral vein distally to the popliteal.  Patient with progressively worsening pain and discomfort left lower extremity.  Recommend a left lower extremity venous lysis in an attempt to assess the patient's anatomy, lessen the clot burden, improve her left lower extremity symptoms and decrease postphlebitic symptoms.  We will also place an IVC filter as the patient is currently undergoing chemo and radiation for stage III cervical cancer and at some point in the future may not tolerate oral anticoagulation.  At this point, the patient will be on oral anticoagulation for at least 6 months.  CTA still pending.  Procedure, risks and benefits explained to the patient.  All questions answered.  Patient wishes to proceed. 2.  Poor venous access: There is multiple attempts to draw blood and place an IV in the patient which were unsuccessful.  We will place a central line for patient comfort and to be able to draw labs such as every 6 hour P DTs while the patient is on heparin.  Procedure, risks and benefits explained to the  patient all questions answered.  Patient wishes to proceed. 3. We had a discussion for approximately three minutes regarding the absolute need for smoking cessation due to the deleterious nature of tobacco on the vascular system. We discussed the tobacco use would diminish patency of any intervention, and likely significantly worsen progressio of disease. We discussed multiple agents for quitting including replacement therapy or medications to reduce cravings such as Chantix. The patient voices their understanding of the importance of smoking cessation.  Discussed with Dr. Francene Castle, PA-C  06/05/2019 2:08 PM  This note was created with Dragon medical transcription system.  Any error is purely unintentional.

## 2019-06-05 NOTE — ED Notes (Signed)
Attempted twice at blood draw with no success. Asked shannon RN and Dierdre Highman Paramedic to look for blood draw. Patient also needs second IV.

## 2019-06-05 NOTE — H&P (Signed)
[facility] [place] NAME: Mariah Mcmahon    MR#:  [id number]  DATE OF BIRTH:  11-Jul-1966  DATE OF ADMISSION:  06/05/2019  PRIMARY CARE PHYSICIAN: Venita Lick, NP   REQUESTING/REFERRING PHYSICIAN: Kinner MD  CHIEF COMPLAINT:  Leg swelling and shortness of breath  HISTORY OF PRESENT ILLNESS:  Mariah Mcmahon  is a 53 y.o. 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 Grayland Ormond has noticed left leg swelling.  Patient was seen by oncology PA today and was sent over to the emergency department.  Venous Dopplers have revealed DVT and ED physician has discussed with Dr. Delana Meyer who might consider placing IVC filter.  At this point of time CT angiogram of the chest is pending.  COVID  test is negative and patient is getting started on heparin drip.  Patient denies any chest pain or shortness of breath with respiration.  PAST MEDICAL HISTORY:   Past Medical History:  Diagnosis Date  . Cancer ([place])    cwervical cancer  . No pertinent past medical history     PAST SURGICAL HISTOIRY:   Past Surgical History:  Procedure Laterality Date  . MASS EXCISION     Throat  . TONSILLECTOMY      SOCIAL HISTORY:   Social History   Tobacco Use  . Smoking status: Current Every Day Smoker    Packs/day: 0.50    Years: 30.00    Pack years: 15.00    Types: Cigarettes  . Smokeless tobacco: Never Used  Substance Use Topics  . Alcohol use: Not Currently    FAMILY HISTORY:   Family History  Problem Relation Age of Onset  . Brain cancer Mother 15  . Alcohol abuse Father   . Heart disease Father   . Prostate cancer Father   . Heart attack Father   . Diabetes Father     DRUG ALLERGIES:  No Known Allergies  REVIEW OF SYSTEMS:  CONSTITUTIONAL: No fever, fatigue or  weakness.  EYES: No blurred or double vision.  EARS, NOSE, AND THROAT: No tinnitus or ear pain.  RESPIRATORY: No cough, reports some shortness of breath with exertion but denies any while resting, denies wheezing or hemoptysis.  CARDIOVASCULAR: No chest pain, orthopnea, edema.  GASTROINTESTINAL: No nausea, vomiting, diarrhea or abdominal pain.  GENITOURINARY: No dysuria, hematuria.  ENDOCRINE: No polyuria, nocturia,  HEMATOLOGY: No anemia, easy bruising or bleeding SKIN: No rash or lesion. MUSCULOSKELETAL: Left leg swelling and pain  nEUROLOGIC: No tingling, numbness, weakness.  PSYCHIATRY: No anxiety or depression.   MEDICATIONS AT HOME:   Prior to Admission medications   Medication Sig Start Date End Date Taking? Authorizing Provider  meloxicam (MOBIC) 7.5 MG tablet Take 7.5 mg by mouth daily.   Yes [provider]  methocarbamol (ROBAXIN) 500 MG tablet Take 1 tablet (500 mg total) by mouth 2 (two) times daily as needed for muscle spasms (for lower back pain). 05/03/19  Yes Cannady, Jolene T, NP  cyclobenzaprine (FLEXERIL) 10 MG tablet Take 1 tablet by mouth 1 day or 1 dose. 05/20/19   [provider]  HYDROcodone-acetaminophen (NORCO) 5-325 MG tablet Take 1 tablet by mouth every 6 (six) hours as needed for moderate pain. Patient not taking: Reported on 05/31/2019 05/24/19   Borders, Kirt Boys, NP  ondansetron [facility]) 8  MG tablet Take 1 tablet (8 mg total) by mouth 2 (two) times daily as needed. Start on the third day after chemotherapy. 05/09/19   Lloyd Huger, MD  prochlorperazine (COMPAZINE) 10 MG tablet Take 1 tablet (10 mg total) by mouth every 6 (six) hours as needed (Nausea or vomiting). Patient not taking: Reported on 06/05/2019 05/09/19   Lloyd Huger, MD      VITAL SIGNS:  Blood pressure 106/71, pulse 76, temperature 98.9 F (37.2 C), temperature source Oral, resp. rate (!) 22, height 5\' 2"  (1.575 m), weight 51.3 kg, last menstrual period 03/14/2019,  SpO2 100 %.  PHYSICAL EXAMINATION:  GENERAL:  53 y.o.-year-old patient lying in the bed with no acute distress.  EYES: Pupils equal, round, reactive to light and accommodation. No scleral icterus. Extraocular muscles intact.  HEENT: Head atraumatic, normocephalic. Oropharynx and nasopharynx clear.  NECK:  Supple, no jugular venous distention. No thyroid enlargement, no tenderness.  LUNGS: Normal breath sounds bilaterally, no wheezing, rales,rhonchi or crepitation. No use of accessory muscles of respiration.  CARDIOVASCULAR: S1, S2 normal. No murmurs, rubs, or gallops.  ABDOMEN: Soft, nontender, nondistended. Bowel sounds present.  EXTREMITIES: Left leg is tender and edematous no pedal edema, cyanosis, or clubbing.  NEUROLOGIC: Cranial nerves II through XII are intact. Muscle strength 5/5 in all extremities. Sensation intact. Gait not checked.  PSYCHIATRIC: The patient is alert and oriented x 3.  SKIN: No obvious rash, lesion, or ulcer.   LABORATORY PANEL:   CBC Recent Labs  Lab 06/05/19 1058  WBC 6.3  HGB 8.8*  HCT 27.4*  PLT 184   ------------------------------------------------------------------------------------------------------------------  Chemistries  Recent Labs  Lab 05/31/19 0857  NA 136  K 3.3*  CL 102  CO2 24  GLUCOSE 132*  BUN 20  CREATININE 1.17*  CALCIUM 8.6*   ------------------------------------------------------------------------------------------------------------------  Cardiac Enzymes No results for input(s): TROPONINI in the last 168 hours. ------------------------------------------------------------------------------------------------------------------  RADIOLOGY:  Dg Chest 1 View  Result Date: 06/05/2019 CLINICAL DATA:  Shortness of breath and leg swelling. EXAM: CHEST  1 VIEW COMPARISON:  Chest CT 05/01/2019 FINDINGS: The cardiac silhouette, mediastinal and hilar contours are within normal limits and stable. The lungs are clear of an acute  process. No infiltrates, edema or effusions. No worrisome pulmonary lesions. The bony thorax is intact. IMPRESSION: No acute cardiopulmonary findings and no worrisome pulmonary lesions. Electronically Signed   By: Marijo Sanes M.D.   On: 06/05/2019 11:39   US Venous Img Lower Unilateral Left  Result Date: 06/05/2019 CLINICAL DATA:  Left lower extremity edema. History of smoking and malignancy. Evaluate for DVT. EXAM: LEFT LOWER EXTREMITY VENOUS DOPPLER ULTRASOUND TECHNIQUE: Gray-scale sonography with graded compression, as well as color Doppler and duplex ultrasound were performed to evaluate the lower extremity deep venous systems from the level of the common femoral vein and including the common femoral, femoral, profunda femoral, popliteal and calf veins including the posterior tibial, peroneal and gastrocnemius veins when visible. The superficial great saphenous vein was also interrogated. Spectral Doppler was utilized to evaluate flow at rest and with distal augmentation maneuvers in the common femoral, femoral and popliteal veins. COMPARISON:  None. FINDINGS: Contralateral Common Femoral Vein: Respiratory phasicity is normal and symmetric with the symptomatic side. No evidence of thrombus. Normal compressibility. There is mixed echogenic expansile occlusive thrombus extending from the left common femoral vein (image 8) through the saphenofemoral junction and proximal aspect of the greater saphenous vein (image 12) as well as the imaged aspects of the left  deep femoral vein (image 15). There is hypoechoic occlusive thrombus involving the proximal (image 19), mid (image 22) and distal (image 26) aspects of the left femoral vein, extending to involve the proximal (image 30) and distal (image 34) aspects of the popliteal vein as well as both paired left posterior tibial and peroneal veins. Other Findings:  None. IMPRESSION: Examination is positive for extensive acute occlusive DVT extending from the left  common femoral vein through the left popliteal veins. Electronically Signed   By: Sandi Mariscal M.D.   On: 06/05/2019 12:20    EKG:   Orders placed or performed during the hospital encounter of 06/05/19  . ED EKG  . ED EKG    IMPRESSION AND PLAN:   #Acute left leg pain Admit to MedSurg unit Venous Dopplers positive for extensive acute occlusive DVT extending from the left common femoral vein through the left popliteal veins. Pain management as needed Vascular surgery consult placed and secured text message sent to Dr. Delana Meyer to see the patient for possible filter placement Patient is getting started on heparin drip by the ED physician   #Dyspnea  CT angiogram of the chest is pending to rule out pulmonary embolism  #Stage stage III endocervical cancer with possible lung mets Currently patient is on chemoradiation therapy Oncology  consult placed and to Dr. Grayland Ormond notified  #Normocytic anemia Follow-up with [place] oncology continue close monitoring  DVT prophylaxis with heparin drip  All the records are reviewed and case discussed with ED provider. Management plans discussed with the patient, family and they are in agreement.  CODE STATUS: FC  TOTAL TIME TAKING CARE OF THIS PATIENT: 49minutes.   Note: This dictation was prepared with Dragon dictation along with smaller phrase technology. Any transcriptional errors that result from this process are unintentional.  Nicholes Mango M.D on 06/05/2019 at 1:33 PM  Between 7am to 6pm - Pager - [phone]  After 6pm go to [email or web address] - password [email or web address] [facility]  Office  [phone]  CC: Primary care physician; Venita Lick, NP

## 2019-06-05 NOTE — ED Notes (Signed)
Lab contacted to send phlebotomist for green and blue top blood draw

## 2019-06-05 NOTE — Progress Notes (Signed)
ANTICOAGULATION CONSULT NOTE - Initial Consult  Pharmacy Consult for Heparin  Indication: DVT  No Known Allergies  Patient Measurements: Height: 5\' 2"  (157.5 cm) Weight: 116 lb 13.5 oz (53 kg) IBW/kg (Calculated) : 50.1 Heparin Dosing Weight:  53 kg   Vital Signs: Temp: 101.4 F (38.6 C) (07/28 2147) Temp Source: Oral (07/28 2147) BP: 143/74 (07/28 2147) Pulse Rate: 91 (07/28 2147)  Labs: Recent Labs    06/05/19 1058 06/05/19 1425 06/05/19 2121  HGB 8.8*  --   --   HCT 27.4*  --   --   PLT 184  --   --   APTT  --  27  --   LABPROT  --  14.2  --   INR  --  1.1  --   HEPARINUNFRC  --   --  <0.10*  CREATININE  --  1.05*  --     Estimated Creatinine Clearance: 50.7 mL/min (A) (by C-G formula based on SCr of 1.05 mg/dL (H)).   Medical History: Past Medical History:  Diagnosis Date  . Cancer ([place])    cwervical cancer  . No pertinent past medical history     Medications:  Medications Prior to Admission  Medication Sig Dispense Refill Last Dose  . meloxicam (MOBIC) 7.5 MG tablet Take 7.5 mg by mouth daily.   06/05/2019 at 0600  . methocarbamol (ROBAXIN) 500 MG tablet Take 1 tablet (500 mg total) by mouth 2 (two) times daily as needed for muscle spasms (for lower back pain). 120 tablet 1 06/05/2019 at 0600  . cyclobenzaprine (FLEXERIL) 10 MG tablet Take 1 tablet by mouth 1 day or 1 dose.   Completed Course at Unknown time  . HYDROcodone-acetaminophen (NORCO) 5-325 MG tablet Take 1 tablet by mouth every 6 (six) hours as needed for moderate pain. (Patient not taking: Reported on 05/31/2019) 45 tablet 0   . ondansetron (ZOFRAN) 8 MG tablet Take 1 tablet (8 mg total) by mouth 2 (two) times daily as needed. Start on the third day after chemotherapy. 30 tablet 2 unknown at prn  . prochlorperazine (COMPAZINE) 10 MG tablet Take 1 tablet (10 mg total) by mouth every 6 (six) hours as needed (Nausea or vomiting). (Patient not taking: Reported on 06/05/2019) 60 tablet 2 Not Taking at  Unknown time    Assessment: Pharmacy has been consulted to initiate Heparin Drip on 53yo patient admitted with DVT in left leg. Patient has no prior history of anticoagulant use. Baseline labs have been ordered and are pending.  Goal of Therapy:  Heparin level 0.3-0.7 units/ml Monitor platelets by anticoagulation protocol: Yes   Plan:  Give 3000 units bolus x 1 Start heparin infusion at 870 units/hr Check anti-Xa level in 6 hours and daily while on heparin Continue to monitor H&H and platelets  7/28:  HL @ 2131 = < 0.1 Will order Heparin 1600 units IV X 1 and increase drip rate to 1050 units/hr.  Will recheck HL 6 hrs after rate change.  Skyann Ganim D 06/05/2019,10:35 PM

## 2019-06-05 NOTE — ED Notes (Signed)
Pt still in US at this time.

## 2019-06-05 NOTE — ED Provider Notes (Signed)
[facility] Emergency Department Provider Note   ____________________________________________    I have reviewed the triage vital signs and the nursing notes.   HISTORY  Chief Complaint Shortness of Breath     HPI Mariah Mcmahon is a 53 y.o. female currently being treated for cervical cancer with chemotherapy and radiation who presents today with left lower leg swelling.  Patient reports she noticed this this morning.  She felt well yesterday without leg swelling.  Denies shortness of breath or chest pain or pleurisy.  No history of DVT.  Describes mild tightness sensation in her leg but no pain.  Does not take anything for this.  Past Medical History:  Diagnosis Date  . Cancer ([place])    cwervical cancer  . No pertinent past medical history     Patient Active Problem List   Diagnosis Date Noted  . DVT of lower limb, acute ([place]) 06/05/2019  . Iron deficiency anemia due to chronic blood loss 05/24/2019  . Goals of care, counseling/discussion 05/12/2019  . Cervical cancer ([place]) 05/04/2019  . Generalized abdominal pain 04/09/2019  . Acute midline low back pain without sciatica 04/09/2019  . Nicotine dependence, cigarettes, uncomplicated 35/57/3220    Past Surgical History:  Procedure Laterality Date  . MASS EXCISION     Throat  . TONSILLECTOMY      Prior to Admission medications   Medication Sig Start Date End Date Taking? Authorizing Provider  meloxicam (MOBIC) 7.5 MG tablet Take 7.5 mg by mouth daily.   Yes [provider]  methocarbamol (ROBAXIN) 500 MG tablet Take 1 tablet (500 mg total) by mouth 2 (two) times daily as needed for muscle spasms (for lower back pain). 05/03/19  Yes Cannady, Jolene T, NP  cyclobenzaprine (FLEXERIL) 10 MG tablet Take 1 tablet by mouth 1 day or 1 dose. 05/20/19   [provider]  HYDROcodone-acetaminophen (NORCO) 5-325 MG tablet Take 1 tablet by mouth every 6 (six) hours as needed for moderate  pain. Patient not taking: Reported on 05/31/2019 05/24/19   Borders, Kirt Boys, NP  ondansetron (ZOFRAN) 8 MG tablet Take 1 tablet (8 mg total) by mouth 2 (two) times daily as needed. Start on the third day after chemotherapy. 05/09/19   Lloyd Huger, MD  prochlorperazine (COMPAZINE) 10 MG tablet Take 1 tablet (10 mg total) by mouth every 6 (six) hours as needed (Nausea or vomiting). Patient not taking: Reported on 06/05/2019 05/09/19   Lloyd Huger, MD     Allergies Patient has no known allergies.  Family History  Problem Relation Age of Onset  . Brain cancer Mother 62  . Alcohol abuse Father   . Heart disease Father   . Prostate cancer Father   . Heart attack Father   . Diabetes Father     Social History Social History   Tobacco Use  . Smoking status: Current Every Day Smoker    Packs/day: 0.50    Years: 30.00    Pack years: 15.00    Types: Cigarettes  . Smokeless tobacco: Never Used  Substance Use Topics  . Alcohol use: Not Currently  . Drug use: Never    Review of Systems  Constitutional: No fever/chills Eyes: No visual changes.  ENT: No sore throat. Cardiovascular: Denies chest pain. Respiratory: Denies shortness of breath.  No pleurisy Gastrointestinal: No abdominal pain. Genitourinary: Negative for dysuria. Musculoskeletal: As above Skin: Negative for rash. Neurological: Negative for headaches or weakness   ____________________________________________  PHYSICAL EXAM:  VITAL SIGNS: ED Triage Vitals  Enc Vitals Group     BP 06/05/19 1034 (!) 90/56     Pulse Rate 06/05/19 1034 (!) 103     Resp 06/05/19 1034 (!) 24     Temp 06/05/19 1034 98.9 F (37.2 C)     Temp Source 06/05/19 1034 Oral     SpO2 06/05/19 1034 100 %     Weight 06/05/19 1034 51.3 kg (113 lb)     Height 06/05/19 1034 1.575 m (5\' 2" )     Head Circumference --      Peak Flow --      Pain Score 06/05/19 1037 0     Pain Loc --      Pain Edu? --      Excl. in [place]? --      Constitutional: Alert and oriented.  Eyes: Conjunctivae are normal.    Mouth/Throat: Mucous membranes are moist.    Cardiovascular: Normal rate, regular rhythm. Grossly normal heart sounds.  Good peripheral circulation. Respiratory: Normal respiratory effort.  No retractions. Lungs CTAB. Gastrointestinal: Soft and nontender. No distention.  No CVA tenderness. Genitourinary: deferred Musculoskeletal: Left lower extremity is significantly more swollen than the right extremity, certainly concerning for DVT, foot is warm and well perfused, concern for phlegmasia Neurologic:  Normal speech and language. No gross focal neurologic deficits are appreciated.  Skin:  Skin is warm, dry and intact. No rash noted. Psychiatric: Mood and affect are normal. Speech and behavior are normal.  ____________________________________________   LABS (all labs ordered are listed, but only abnormal results are displayed)  Labs Reviewed  CBC WITH DIFFERENTIAL/PLATELET - Abnormal; Notable for the following components:      Result Value   RBC 2.94 (*)    Hemoglobin 8.8 (*)    HCT 27.4 (*)    RDW 16.3 (*)    Lymphs Abs 0.3 (*)    All other components within normal limits  SARS CORONAVIRUS 2 (HOSPITAL ORDER, [place] [facility])  APTT  PROTIME-INR  COMPREHENSIVE METABOLIC PANEL  PROTIME-INR  APTT   ____________________________________________  EKG  None ____________________________________________  RADIOLOGY  Chest x-ray unremarkable Ultrasound extensive acute occlusive DVT extending from the left common femoral vein to the left popliteal veins ____________________________________________   PROCEDURES  Procedure(s) performed: No  Procedures   Critical Care performed: yes  CRITICAL CARE Performed by: Lavonia Drafts   Total critical care time: 30 minutes  Critical care time was exclusive of separately billable procedures and treating other patients.  Critical care  was necessary to treat or prevent imminent or life-threatening deterioration.  Critical care was time spent personally by me on the following activities: development of treatment plan with patient and/or surrogate as well as nursing, discussions with consultants, evaluation of patient's response to treatment, examination of patient, obtaining history from patient or surrogate, ordering and performing treatments and interventions, ordering and review of laboratory studies, ordering and review of radiographic studies, pulse oximetry and re-evaluation of patient's condition.  ____________________________________________   INITIAL IMPRESSION / ASSESSMENT AND PLAN / ED COURSE  Pertinent labs & imaging results that were available during my care of the patient were reviewed by me and considered in my medical decision making (see chart for details).  Patient presents with acute swelling of the left leg, foot is warm and well perfused, strongly suspicious for DVT especially given active CA.  Ultrasound confirms significant DVT, consulted vascular surgery, they will see the patient in the  ED.  Heparin started.  Admitted to the hospital service    ____________________________________________   FINAL CLINICAL IMPRESSION(S) / ED DIAGNOSES  Final diagnoses:  Acute deep vein thrombosis (DVT) of femoral vein of left lower extremity ([place])        Note:  This document was prepared using Dragon voice recognition software and may include unintentional dictation errors.   Lavonia Drafts, MD 06/05/19 [phone]

## 2019-06-05 NOTE — ED Notes (Signed)
Joelene Millin PA at bedside

## 2019-06-05 NOTE — ED Notes (Signed)
ED TO INPATIENT HANDOFF REPORT  ED Nurse Name and Phone #: Ripley Bogosian 14  S Name/Age/Gender Mariah Mcmahon 53 y.o. female Room/Bed: ED15A/ED15A  Code Status   Code Status: Full Code  Home/SNF/Other Home Patient oriented to: self, place, time and situation Is this baseline? Yes   Triage Complete: Triage complete  Chief Complaint leg swelling;sob  Triage Note C/O SOB and left leg swelling.  STates symptom onset was this morning.    Patient is hyperventilating in triage.     Allergies No Known Allergies  Level of Care/Admitting Diagnosis ED Disposition    ED Disposition Condition [facility] Area: [place] [[id number]]  Level of Care: Med-Surg [16]  Covid Evaluation: Person Under Investigation (PUI)  Diagnosis: DVT of lower limb, acute [facility]) [[id number]]  Admitting Physician: Nicholes Mango [[id number]]  Attending Physician: Nicholes Mango [[id number]]  Estimated length of stay: past midnight tomorrow  Certification:: I certify this patient will need inpatient services for at least 2 midnights  Bed request comments: 1c  PT Class (Do Not Modify): Inpatient [101]  PT Acc Code (Do Not Modify): Private [1]       B Medical/Surgery History Past Medical History:  Diagnosis Date  . Cancer ([place])    cwervical cancer  . No pertinent past medical history    Past Surgical History:  Procedure Laterality Date  . MASS EXCISION     Throat  . TONSILLECTOMY       A IV Location/Drains/Wounds Patient Lines/Drains/Airways Status   Active Line/Drains/Airways    Name:   Placement date:   Placement time:   Site:   Days:   Peripheral IV 06/05/19 Right Forearm   06/05/19    1058    Forearm   less than 1          Intake/Output Last 24 hours No intake or output data in the 24 hours ending 06/05/19 1359  Labs/Imaging Results for orders placed or performed during the hospital encounter of 06/05/19 (from the past 48 hour(s))  CBC with Differential     Status:  Abnormal   Collection Time: 06/05/19 10:58 AM  Result Value Ref Range   WBC 6.3 4.0 - 10.5 K/uL   RBC 2.94 (L) 3.87 - 5.11 MIL/uL   Hemoglobin 8.8 (L) 12.0 - 15.0 g/dL   HCT 27.4 (L) 36.0 - 46.0 %   MCV 93.2 80.0 - 100.0 fL   MCH 29.9 26.0 - 34.0 pg   MCHC 32.1 30.0 - 36.0 g/dL   RDW 16.3 (H) 11.5 - 15.5 %   Platelets 184 150 - 400 K/uL   nRBC 0.0 0.0 - 0.2 %   Neutrophils Relative % 82 %   Neutro Abs 5.2 1.7 - 7.7 K/uL   Lymphocytes Relative 4 %   Lymphs Abs 0.3 (L) 0.7 - 4.0 K/uL   Monocytes Relative 10 %   Monocytes Absolute 0.6 0.1 - 1.0 K/uL   Eosinophils Relative 3 %   Eosinophils Absolute 0.2 0.0 - 0.5 K/uL   Basophils Relative 0 %   Basophils Absolute 0.0 0.0 - 0.1 K/uL   Immature Granulocytes 1 %   Abs Immature Granulocytes 0.06 0.00 - 0.07 K/uL    Comment: Performed at [facility], [address], [place], [place] [address]  SARS Coronavirus 2 (CEPHEID - Performed in [place] hospital lab), Hosp Order     Status: None   Collection Time: 06/05/19 11:51 AM   Specimen: Nasopharyngeal Swab  Result  Value Ref Range   SARS Coronavirus 2 NEGATIVE NEGATIVE    Comment: (NOTE) If result is NEGATIVE SARS-CoV-2 target nucleic acids are NOT DETECTED. The SARS-CoV-2 RNA is generally detectable in upper and lower  respiratory specimens during the acute phase of infection. The lowest  concentration of SARS-CoV-2 viral copies this assay can detect is 250  copies / mL. A negative result does not preclude SARS-CoV-2 infection  and should not be used as the sole basis for treatment or other  patient management decisions.  A negative result may occur with  improper specimen collection / handling, submission of specimen other  than nasopharyngeal swab, presence of viral mutation(s) within the  areas targeted by this assay, and inadequate number of viral copies  (<250 copies / mL). A negative result must be combined with clinical  observations, patient history, and  epidemiological information. If result is POSITIVE SARS-CoV-2 target nucleic acids are DETECTED. The SARS-CoV-2 RNA is generally detectable in upper and lower  respiratory specimens dur ing the acute phase of infection.  Positive  results are indicative of active infection with SARS-CoV-2.  Clinical  correlation with patient history and other diagnostic information is  necessary to determine patient infection status.  Positive results do  not rule out bacterial infection or co-infection with other viruses. If result is PRESUMPTIVE POSTIVE SARS-CoV-2 nucleic acids MAY BE PRESENT.   A presumptive positive result was obtained on the submitted specimen  and confirmed on repeat testing.  While 2019 novel coronavirus  (SARS-CoV-2) nucleic acids may be present in the submitted sample  additional confirmatory testing may be necessary for epidemiological  and / or clinical management purposes  to differentiate between  SARS-CoV-2 and other Sarbecovirus currently known to infect humans.  If clinically indicated additional testing with an alternate test  methodology [phone]) is advised. The SARS-CoV-2 RNA is generally  detectable in upper and lower respiratory sp ecimens during the acute  phase of infection. The expected result is Negative. Fact Sheet for Patients:  StrictlyIdeas.no Fact Sheet for Healthcare Providers: BankingDealers.co.za This test is not yet approved or cleared by the [place] FDA and has been authorized for detection and/or diagnosis of SARS-CoV-2 by FDA under an Emergency Use Authorization (EUA).  This EUA will remain in effect (meaning this test can be used) for the duration of the COVID-19 declaration under Section 564(b)(1) of the Act, 21 U.S.C. section 360bbb-3(b)(1), unless the authorization is terminated or revoked sooner. Performed at [facility], [address] [place], [place] [address]    Dg  Chest 1 View  Result Date: 06/05/2019 CLINICAL DATA:  Shortness of breath and leg swelling. EXAM: CHEST  1 VIEW COMPARISON:  Chest CT 05/01/2019 FINDINGS: The cardiac silhouette, mediastinal and hilar contours are within normal limits and stable. The lungs are clear of an acute process. No infiltrates, edema or effusions. No worrisome pulmonary lesions. The bony thorax is intact. IMPRESSION: No acute cardiopulmonary findings and no worrisome pulmonary lesions. Electronically Signed   By: Marijo Sanes M.D.   On: 06/05/2019 11:39   US Venous Img Lower Unilateral Left  Result Date: 06/05/2019 CLINICAL DATA:  Left lower extremity edema. History of smoking and malignancy. Evaluate for DVT. EXAM: LEFT LOWER EXTREMITY VENOUS DOPPLER ULTRASOUND TECHNIQUE: Gray-scale sonography with graded compression, as well as color Doppler and duplex ultrasound were performed to evaluate the lower extremity deep venous systems from the level of the common femoral vein and including the common femoral, femoral, profunda femoral, popliteal and  calf veins including the posterior tibial, peroneal and gastrocnemius veins when visible. The superficial great saphenous vein was also interrogated. Spectral Doppler was utilized to evaluate flow at rest and with distal augmentation maneuvers in the common femoral, femoral and popliteal veins. COMPARISON:  None. FINDINGS: Contralateral Common Femoral Vein: Respiratory phasicity is normal and symmetric with the symptomatic side. No evidence of thrombus. Normal compressibility. There is mixed echogenic expansile occlusive thrombus extending from the left common femoral vein (image 8) through the saphenofemoral junction and proximal aspect of the greater saphenous vein (image 12) as well as the imaged aspects of the left deep femoral vein (image 15). There is hypoechoic occlusive thrombus involving the proximal (image 19), mid (image 22) and distal (image 26) aspects of the left femoral vein,  extending to involve the proximal (image 30) and distal (image 34) aspects of the popliteal vein as well as both paired left posterior tibial and peroneal veins. Other Findings:  None. IMPRESSION: Examination is positive for extensive acute occlusive DVT extending from the left common femoral vein through the left popliteal veins. Electronically Signed   By: Sandi Mariscal M.D.   On: 06/05/2019 12:20    Pending Labs Unresulted Labs (From admission, onward)    Start     Ordered   06/05/19 1301  Protime-INR  Add-on,   AD     06/05/19 1300   06/05/19 1301  APTT  Add-on,   AD     06/05/19 1300   06/05/19 1200  Comprehensive metabolic panel  Once,   R     06/05/19 1200   06/05/19 1055  APTT  ONCE - STAT,   STAT     06/05/19 1054   06/05/19 1055  Protime-INR  ONCE - STAT,   STAT     06/05/19 1054          Vitals/Pain Today's Vitals   06/05/19 1209 06/05/19 1228 06/05/19 1251 06/05/19 1300  BP:    106/71  Pulse: 84 75 73 76  Resp: 20 20 17  (!) 22  Temp:      TempSrc:      SpO2: 100% 100% 100% 100%  Weight:      Height:      PainSc:        Isolation Precautions No active isolations  Medications Medications  heparin bolus via infusion 3,000 Units (has no administration in time range)    Followed by  heparin ADULT infusion 100 units/mL (25000 units/239mL sodium chloride 0.45%) (has no administration in time range)  HYDROcodone-acetaminophen (NORCO/VICODIN) 5-325 MG per tablet 1 tablet (has no administration in time range)  cyclobenzaprine (FLEXERIL) tablet 10 mg (has no administration in time range)  methocarbamol (ROBAXIN) tablet 500 mg (has no administration in time range)  ondansetron (ZOFRAN) tablet 4 mg (has no administration in time range)    Or  ondansetron (ZOFRAN) injection 4 mg (has no administration in time range)  senna-docusate (Senokot-S) tablet 1 tablet (has no administration in time range)  0.9 %  sodium chloride infusion (1,000 mLs Intravenous New Bag/Given  06/05/19 1345)    Mobility walks Low fall risk   Focused Assessments    R Recommendations: See Admitting Provider Note  Report given to:   Additional Notes:

## 2019-06-05 NOTE — Consult Note (Signed)
ANTICOAGULATION CONSULT NOTE - Initial Consult  Pharmacy Consult for Heparin Drip Indication: DVT  No Known Allergies  Patient Measurements: Height: 5\' 2"  (157.5 cm) Weight: 113 lb (51.3 kg) IBW/kg (Calculated) : 50.1 Heparin Dosing Weight: 51.3 kg  Vital Signs: Temp: 98.9 F (37.2 C) (07/28 1034) Temp Source: Oral (07/28 1034) BP: 102/63 (07/28 1130) Pulse Rate: 75 (07/28 1130)  Labs: Recent Labs    06/05/19 1058  HGB 8.8*  HCT 27.4*  PLT 184    Estimated Creatinine Clearance: 45.5 mL/min (A) (by C-G formula based on SCr of 1.17 mg/dL (H)).   Medical History: Past Medical History:  Diagnosis Date  . Cancer ([place])    cwervical cancer  . No pertinent past medical history     Medications:  (Not in a hospital admission)  Scheduled:  . heparin  3,000 Units Intravenous Once   Infusions:  . sodium chloride    . heparin     PRN:  Anti-infectives (From admission, onward)   None      Assessment: Pharmacy has been consulted to initiate Heparin Drip on 52yo patient admitted with DVT in left leg. Patient has no prior history of anticoagulant use. Baseline labs have been ordered and are pending.  Goal of Therapy:  Heparin level 0.3-0.7 units/ml Monitor platelets by anticoagulation protocol: Yes   Plan:  Give 3000 units bolus x 1 Start heparin infusion at 870 units/hr Check anti-Xa level in 6 hours and daily while on heparin Continue to monitor H&H and platelets  Lin Hackmann A Nhia Heaphy 06/05/2019,12:55 PM

## 2019-06-05 NOTE — ED Notes (Signed)
Patient being transported at this time by Waunita Schooner Paramedic to room 118

## 2019-06-05 NOTE — ED Notes (Signed)
IV team reported there was no need for second IV at this time. Relayed if floor needed second IV for them to place another order and they would come place IV.

## 2019-06-05 NOTE — ED Notes (Signed)
SPoke with Mariah Mcmahon in [place], reported they need lab results before can complete CT angio

## 2019-06-05 NOTE — Progress Notes (Signed)
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    Chief complaint/ Reason for visit- Left swollen leg   Heme/Onc history:  Oncology History  Cervical cancer ([place])  05/04/2019 Initial Diagnosis   Cervical cancer ([place])   05/09/2019 Cancer Staging   Staging form: Cervix Uteri, AJCC 8th Edition - Clinical: FIGO Stage IIIB (cT3b, cN1, cM0) - Signed by Lloyd Huger, MD on 05/09/2019   05/17/2019 -  Chemotherapy   The patient had palonosetron (ALOXI) injection 0.25 mg, 0.25 mg, Intravenous,  Once, 3 of 7 cycles Administration: 0.25 mg (05/17/2019), 0.25 mg (05/24/2019), 0.25 mg (05/31/2019) CISplatin (PLATINOL) 61 mg in sodium chloride 0.9 % 250 mL chemo infusion, 40 mg/m2 = 61 mg, Intravenous,  Once, 3 of 7 cycles Administration: 61 mg (05/17/2019), 61 mg (05/24/2019), 61 mg (05/31/2019) fosaprepitant (EMEND) 150 mg, dexamethasone (DECADRON) 12 mg in sodium chloride 0.9 % 145 mL IVPB, , Intravenous,  Once, 3 of 7 cycles Administration:  (05/17/2019),  (05/24/2019),  (05/31/2019)  for chemotherapy treatment.     Interval history-53 year old female with multiple medical problems including stage IIIb squamous cell cancer of the endocervix with possible metastasis to lung and sacrum diagnosed in June 2020.  She was found not to be a good surgical candidate and treatment was initiated with concurrent chemoradiation.  She is currently receiving weekly cisplatin and is on cycle 3 (last given on 05/31/2019).  She is not a surgical candidate and plan is to reimage after the completion of radiation followed by possible brachii  therapy. Possible re-biopsy for PDL-1 testing if residual disease noted.  Today, she complains of right lower extremity swelling that began at 8:30 this morning.  She is also short of breath.  She denies any chest pain.  Symptoms started this morning and has progressively become worse.    ECOG FS:1 - Symptomatic but completely ambulatory  Review of systems- Review of Systems  Constitutional: Positive for malaise/fatigue. Negative for chills, fever and weight loss.  HENT: Negative for congestion, ear pain and tinnitus.   Eyes: Negative.  Negative for blurred vision and double vision.  Respiratory: Positive for shortness of breath. Negative for cough and sputum production.   Cardiovascular: Positive for leg swelling (left). Negative for chest pain and palpitations.  Gastrointestinal: Negative.  Negative for abdominal pain, constipation, diarrhea, nausea and vomiting.  Genitourinary: Negative for dysuria, frequency and urgency.  Musculoskeletal: Negative for back pain and falls.  Skin: Negative.  Negative for rash.  Neurological: Positive for dizziness and weakness. Negative for headaches.  Endo/Heme/Allergies: Negative.  Does not bruise/bleed easily.  Psychiatric/Behavioral: Negative.  Negative for depression. The patient is not nervous/anxious and does not have insomnia.      Current treatment-weekly cisplatin with concurrent radiation.  Status post cycle 3 last given on 05/31/2019.  No Known Allergies   Past Medical History:  Diagnosis Date   Cancer ([place])    cwervical cancer   No pertinent past medical history      Past Surgical History:  Procedure Laterality Date   MASS EXCISION     Throat   TONSILLECTOMY      Social  History   Socioeconomic History   Marital status: Divorced    Spouse name: Not on file   Number of children: Not on file   Years of education: Not on file   Highest education level: Not on file  Occupational History   Occupation: [employer]     Employer: [employer] resource strain: Not hard at all   Food insecurity    Worry: Never true    Inability: Never true   Transportation needs    Medical: No    Non-medical: No  Tobacco Use   Smoking status: Current Every Day Smoker    Packs/day: 0.50    Years: 30.00    Pack years: 15.00    Types: Cigarettes   Smokeless tobacco: Never Used  Substance and Sexual Activity   Alcohol use: Not Currently   Drug use: Never   Sexual activity: Yes    Comment: not at moment  Lifestyle   Physical activity    Days per week: 6 days    Minutes per session: 40 min   Stress: Not at all  Relationships   Social connections    Talks on phone: More than three times a week    Gets together: More than three times a week    Attends religious service: Never    Active member of club or organization: No    Attends meetings of clubs or organizations: Never    Relationship status: Divorced   Intimate partner violence    Fear of current or ex partner: No    Emotionally abused: No    Physically abused: No    Forced sexual activity: No  Other Topics Concern   Not on file  Social History Narrative   Not on file    Family History  Problem Relation Age of Onset   Brain cancer Mother 55   Alcohol abuse Father    Heart disease Father    Prostate cancer Father    Heart attack Father    Diabetes Father      Current Outpatient Medications:    cyclobenzaprine (FLEXERIL) 10 MG tablet, Take 1 tablet by mouth 1 day or 1 dose., Disp: , Rfl:    HYDROcodone-acetaminophen (NORCO) 5-325 MG tablet, Take 1 tablet by mouth every 6 (six) hours as needed for moderate pain. (Patient not taking: Reported on 05/31/2019), Disp: 45 tablet, Rfl: 0   meloxicam (MOBIC) 7.5 MG tablet, Take 7.5 mg by mouth daily., Disp: , Rfl:    methocarbamol (ROBAXIN) 500 MG tablet, Take 1 tablet (500 mg total) by mouth 2 (two) times daily as needed for muscle spasms  (for lower back pain)., Disp: 120 tablet, Rfl: 1   ondansetron (ZOFRAN) 8 MG tablet, Take 1 tablet (8 mg total) by mouth 2 (two) times daily as needed. Start on the third day after chemotherapy., Disp: 30 tablet, Rfl: 2   prochlorperazine (COMPAZINE) 10 MG tablet, Take 1 tablet (10 mg total) by mouth every 6 (six) hours as needed (Nausea or vomiting)., Disp: 60 tablet, Rfl: 2  Physical exam: There were no vitals filed for this visit. Physical Exam Constitutional:      General: She is in acute distress.     Appearance: Normal appearance.  HENT:     Head: Normocephalic and atraumatic.  Eyes:     Pupils: Pupils are equal, round, and reactive to light.  Neck:     Musculoskeletal: Normal range of motion.  Cardiovascular:  Rate and Rhythm: Normal rate and regular rhythm.     Heart sounds: Normal heart sounds. No murmur.  Pulmonary:     Breath sounds: Normal breath sounds. No wheezing.  Abdominal:     General: Bowel sounds are normal. There is no distension.     Palpations: Abdomen is soft.     Tenderness: There is no abdominal tenderness.  Musculoskeletal: Normal range of motion.        General: Swelling present.     Left lower leg: Edema present.  Skin:    General: Skin is warm and dry.     Findings: No rash.  Neurological:     Mental Status: She is alert and oriented to person, place, and time.  Psychiatric:        Judgment: Judgment normal.      CMP Latest Ref Rng & Units 05/31/2019  Glucose 70 - 99 mg/dL 132(H)  BUN 6 - 20 mg/dL 20  Creatinine 0.44 - 1.00 mg/dL 1.17(H)  Sodium 135 - 145 mmol/L 136  Potassium 3.5 - 5.1 mmol/L 3.3(L)  Chloride 98 - 111 mmol/L 102  CO2 22 - 32 mmol/L 24  Calcium 8.9 - 10.3 mg/dL 8.6(L)  Total Protein 6.0 - 8.5 g/dL -  Total Bilirubin 0.0 - 1.2 mg/dL -  Alkaline Phos 39 - 117 IU/L -  AST 0 - 40 IU/L -  ALT 0 - 32 IU/L -   CBC Latest Ref Rng & Units 05/31/2019  WBC 4.0 - 10.5 K/uL 8.1  Hemoglobin 12.0 - 15.0 g/dL 6.6(L)  Hematocrit  36.0 - 46.0 % 20.6(L)  Platelets 150 - 400 K/uL 273    No images are attached to the encounter.  Nm Pet Image Initial (pi) Skull Base To Thigh  Result Date: 05/07/2019 CLINICAL DATA:  Initial treatment strategy for cervical cancer. EXAM: NUCLEAR MEDICINE PET SKULL BASE TO THIGH TECHNIQUE: 6.5 mCi F-18 FDG was injected intravenously. Full-ring PET imaging was performed from the skull base to thigh after the radiotracer. CT data was obtained and used for attenuation correction and anatomic localization. Fasting blood glucose: 94 mg/dl COMPARISON:  05/01/2019 CT chest, abdomen and pelvis. FINDINGS: Mediastinal blood pool activity: SUV max 2.4 Liver activity: SUV max NA NECK: No hypermetabolic lymph nodes in the neck. Incidental CT findings: none CHEST: No hypermetabolic axillary, mediastinal or hilar lymph nodes. No hypermetabolic pulmonary findings. Incidental CT findings: No acute consolidative airspace disease or lung masses. A few scattered small solid pulmonary nodules in both lungs, largest 4 mm in the medial basilar left lower lobe (series 3/image 119), below PET resolution. ABDOMEN/PELVIS: Hypermetabolic bilateral retrocrural lymphadenopathy. Representative 1.0 cm left retrocrural node with max SUV 7.0 (series 3/image 127). Multiple enlarged hypermetabolic left para-aortic and aortocaval nodes. Representative 2.9 cm left para-aortic node with max SUV 12.8 (series 3/image 152). Representative 1.9 cm aortocaval node with max SUV 9.7 (series 3/image 158). Hypermetabolic bilateral common iliac, right external iliac and right internal iliac lymphadenopathy. Representative 2.1 cm left common iliac node with max SUV 12.3 (series 3/image 186). Representative 1.8 cm right external iliac node with max SUV 16.5 (series 3/image 212). Representative 1.3 cm right internal iliac node with max SUV 13.1 (series 3/image 202). Hypermetabolic mass in the cervix with max SUV 19.1, poorly delineated on the noncontrast CT  images. Hypermetabolism throughout the enlarged lower uterine segment and body of the uterus compatible with malignant involvement of the uterus with max SUV 14.7. Small foci of hypermetabolism at the periphery of the tampon  within the anterior vagina without mass correlate on the CT images, favor urinary contamination. Hypermetabolic 6.1 x 3.4 cm soft tissue implant in the anterior left pelvis (series 3/image 216) surrounded by small volume pelvic ascites with max SUV 6.5 (series 3/image 216). No abnormal hypermetabolic activity within the liver, pancreas, adrenal glands, or spleen. Incidental CT findings: Mildly atherosclerotic nonaneurysmal abdominal aorta. Mild right hydroureteronephrosis to the level of the upper pelvic right ureter. SKELETON: No focal hypermetabolic activity to suggest skeletal metastasis. No hypermetabolism associated with a mixed lytic and sclerotic upper left sacral lesion on series 3/image 192. Incidental CT findings: none IMPRESSION: 1. Hypermetabolic mass in the cervix compatible with known primary cervical malignancy. Hypermetabolism throughout the enlarged lower uterine segment and body of the uterus compatible with malignant involvement of the uterus. 2. Hypermetabolic right internal and external iliac, bilateral common iliac, aortocaval, left para-aortic and bilateral retrocrural lymphadenopathy compatible with metastatic nodal disease. 3. Hypermetabolic soft tissue implant in the anterior left pelvis surrounded by small volume pelvic ascites, favor pelvic peritoneal metastasis. 4. No hypermetabolic osseous metastatic disease. No hypermetabolism associated with the mixed lytic and sclerotic upper left sacral lesion, which is probably benign. 5. Tiny bilateral scattered solid pulmonary nodules, below PET resolution, indeterminate, recommend attention on close chest CT follow-up in 3 months. 6.  Aortic Atherosclerosis (ICD10-I70.0). Electronically Signed   By: Ilona Sorrel M.D.   On:  05/07/2019 16:02     Assessment and plan- Patient is a 53 y.o. female who presents for left lower extremity swelling that started this morning.  She is also complaining of shortness of breath.  Stage IIIb squamous cell cancer of endocervix: s/p cycle 3 cisplatin.  Last given on 05/31/2019.  Plan is for reimaging after conclusion of radiation and to to determine if brachii therapy is possible.  May need rebiopsy by IR to determine PDL 1 status.  She is scheduled to return to clinic on 06/07/2019 for assessment, labs and cycle 4 cisplatin.  Left lower extremity swelling/shortness of breath: Significant swelling in left lower extremity.  She also appears to be short of breath.  Patient is unstable and in no acute distress. Patient taken directly to the emergency room by [facility], RN.  Plan: Given instability, patient taken directly to the emergency room for further evaluation.  Dr. Grayland Ormond updated.  Disposition: RTC when able to see Dr. Grayland Ormond for subsequent cycles of cisplatin.   Visit Diagnosis 1. Localized swelling of left lower extremity     Patient expressed understanding and was in agreement with this plan. She also understands that She can call clinic at any time with any questions, concerns, or complaints.   Greater than 50% was spent in counseling and coordination of care with this patient including but not limited to discussion of the relevant topics above (See A&P) including, but not limited to diagnosis and management of acute and chronic medical conditions.   Thank you for allowing me to participate in the care of this very pleasant patient.    Jacquelin Hawking, NP [place] [facility] Cell - [phone] Pager- [phone] 06/05/2019 10:49 AM

## 2019-06-05 NOTE — Progress Notes (Signed)
PIV consult: Discussed with Luetta Nutting, ED RN. Pt currently has saline and heparin ordered. No incompatible meds at this time. In order to preserve pt's veins and keep infection risk low, recommended waiting to see if additional line is needed. If so, bedside RN can place consult.

## 2019-06-05 NOTE — Progress Notes (Signed)
Family Meeting Note  Advance Directive:yes  Today a meeting took place with the Patient.   The following clinical team members were present during this meeting:MD  The following were discussed:Patient's diagnosis: Acute left leg swelling with pain, dyspnea with exertion, stage IIIb squamous cell cancer of the endocervix, is getting admitted to the hospital with left lower extremity DVT.  The treatment plan of care discussed in detail with the patient.  She verbalized understanding of the plan.    Patient's progosis: Unable to determine and Goals for treatment: Full Code  Son Harrell Gave Bents  the healthcare POA  Additional follow-up to be provided: Hospitalist, vascular surgery and oncology  Time spent during discussion:17 min  Nicholes Mango, MD

## 2019-06-05 NOTE — ED Triage Notes (Signed)
C/O SOB and left leg swelling.  STates symptom onset was this morning.    Patient is hyperventilating in triage.

## 2019-06-06 ENCOUNTER — Ambulatory Visit: Payer: BC Managed Care – PPO

## 2019-06-06 ENCOUNTER — Encounter
Admission: EM | Disposition: A | Discharge status: Home / Self Care | Payer: Self-pay | Source: Home / Self Care | Attending: Specialist

## 2019-06-06 ENCOUNTER — Encounter: Payer: Self-pay | Admitting: Vascular Surgery

## 2019-06-06 ENCOUNTER — Other Ambulatory Visit (INDEPENDENT_AMBULATORY_CARE_PROVIDER_SITE_OTHER): Payer: Self-pay | Admitting: Vascular Surgery

## 2019-06-06 DIAGNOSIS — C539 Malignant neoplasm of cervix uteri, unspecified: Secondary | ICD-10-CM

## 2019-06-06 HISTORY — PX: PORTA CATH INSERTION: CATH118285

## 2019-06-06 LAB — CBC
HCT: 22 % — ABNORMAL LOW (ref 36.0–46.0)
Hemoglobin: 7 g/dL — ABNORMAL LOW (ref 12.0–15.0)
MCH: 30 pg (ref 26.0–34.0)
MCHC: 31.8 g/dL (ref 30.0–36.0)
MCV: 94.4 fL (ref 80.0–100.0)
Platelets: 173 10*3/uL (ref 150–400)
RBC: 2.33 MIL/uL — ABNORMAL LOW (ref 3.87–5.11)
RDW: 16 % — ABNORMAL HIGH (ref 11.5–15.5)
WBC: 4.5 10*3/uL (ref 4.0–10.5)
nRBC: 0 % (ref 0.0–0.2)

## 2019-06-06 LAB — HEPARIN LEVEL (UNFRACTIONATED)
Heparin Unfractionated: <0.1 IU/mL — ABNORMAL LOW (ref 0.30–0.70)
Heparin Unfractionated: <0.1 IU/mL — ABNORMAL LOW (ref 0.30–0.70)

## 2019-06-06 LAB — PREPARE RBC (CROSSMATCH)

## 2019-06-06 SURGERY — PORTA CATH INSERTION
Anesthesia: Moderate Sedation

## 2019-06-06 MED ORDER — DIPHENHYDRAMINE HCL 50 MG/ML IJ SOLN: 50.0000 mg | Freq: Once | INTRAMUSCULAR | Status: DC | PRN

## 2019-06-06 MED ORDER — CEFAZOLIN SODIUM-DEXTROSE 2-4 GM/100ML-% IV SOLN
INTRAVENOUS | Status: AC
  Filled 2019-06-06: qty 100

## 2019-06-06 MED ORDER — FAMOTIDINE 20 MG PO TABS: 40.0000 mg | ORAL_TABLET | Freq: Once | ORAL | Status: DC | PRN

## 2019-06-06 MED ORDER — MIDAZOLAM HCL 2 MG/2ML IJ SOLN
INTRAMUSCULAR | Status: DC | PRN
  Administered 2019-06-06: 2 mg via INTRAVENOUS
  Administered 2019-06-06: 1 mg via INTRAVENOUS

## 2019-06-06 MED ORDER — ELIQUIS 5 MG VTE STARTER PACK: ORAL_TABLET | ORAL | 0 refills | Status: DC

## 2019-06-06 MED ORDER — CEFAZOLIN SODIUM-DEXTROSE 2-4 GM/100ML-% IV SOLN
2.0000 g | Freq: Once | INTRAVENOUS | Status: AC
  Administered 2019-06-06: 2 g via INTRAVENOUS
  Filled 2019-06-06: qty 100

## 2019-06-06 MED ORDER — SODIUM CHLORIDE 0.9 % IV SOLN
Freq: Once | INTRAVENOUS | Status: DC
  Filled 2019-06-06: qty 2

## 2019-06-06 MED ORDER — HEPARIN (PORCINE) 25000 UT/250ML-% IV SOLN
1650.0000 [IU]/h | INTRAVENOUS | Status: DC
  Administered 2019-06-06: 1400 [IU]/h via INTRAVENOUS
  Filled 2019-06-06: qty 250

## 2019-06-06 MED ORDER — FENTANYL CITRATE (PF) 100 MCG/2ML IJ SOLN
INTRAMUSCULAR | Status: DC | PRN
  Administered 2019-06-06: 100 ug via INTRAVENOUS
  Administered 2019-06-06: 25 ug via INTRAVENOUS

## 2019-06-06 MED ORDER — SODIUM CHLORIDE 0.9% IV SOLUTION: Freq: Once | INTRAVENOUS | Status: DC

## 2019-06-06 MED ORDER — HYDROMORPHONE HCL 1 MG/ML IJ SOLN: 1.0000 mg | Freq: Once | INTRAMUSCULAR | Status: DC | PRN

## 2019-06-06 MED ORDER — FENTANYL CITRATE (PF) 100 MCG/2ML IJ SOLN
INTRAMUSCULAR | Status: AC
  Filled 2019-06-06: qty 2

## 2019-06-06 MED ORDER — CEFAZOLIN SODIUM-DEXTROSE 1-4 GM/50ML-% IV SOLN
1.0000 g | INTRAVENOUS | Status: DC
  Filled 2019-06-06: qty 50

## 2019-06-06 MED ORDER — SODIUM CHLORIDE 0.9 % IV SOLN: INTRAVENOUS | Status: DC | PRN

## 2019-06-06 MED ORDER — ONDANSETRON HCL 4 MG/2ML IJ SOLN: 4.0000 mg | Freq: Four times a day (QID) | INTRAMUSCULAR | Status: DC | PRN

## 2019-06-06 MED ORDER — HEPARIN BOLUS VIA INFUSION
1600.0000 [IU] | Freq: Once | INTRAVENOUS | Status: AC
  Administered 2019-06-06: 07:00:00 1600 [IU] via INTRAVENOUS
  Filled 2019-06-06: qty 1600

## 2019-06-06 MED ORDER — METHYLPREDNISOLONE SODIUM SUCC 125 MG IJ SOLR: 125.0000 mg | Freq: Once | INTRAMUSCULAR | Status: DC | PRN

## 2019-06-06 MED ORDER — SODIUM CHLORIDE 0.9 % IV SOLN
INTRAVENOUS | Status: DC
  Administered 2019-06-06: 16:00:00 via INTRAVENOUS

## 2019-06-06 MED ORDER — APIXABAN 5 MG PO TABS: 5.0000 mg | ORAL_TABLET | Freq: Two times a day (BID) | ORAL | Status: DC

## 2019-06-06 MED ORDER — MIDAZOLAM HCL 2 MG/ML PO SYRP
8.0000 mg | ORAL_SOLUTION | Freq: Once | ORAL | Status: DC | PRN
  Filled 2019-06-06: qty 4

## 2019-06-06 MED ORDER — MIDAZOLAM HCL 2 MG/2ML IJ SOLN
INTRAMUSCULAR | Status: AC
  Filled 2019-06-06: qty 4

## 2019-06-06 SURGICAL SUPPLY — 17 items
ADH SKN CLS APL DERMABOND .7 (GAUZE/BANDAGES/DRESSINGS) ×1
COVER PROBE U/S 5X48 (MISCELLANEOUS) ×2 IMPLANT
DERMABOND ADVANCED (GAUZE/BANDAGES/DRESSINGS) ×2
DERMABOND ADVANCED .7 DNX12 (GAUZE/BANDAGES/DRESSINGS) IMPLANT
DRAPE INCISE IOBAN 66X45 STRL (DRAPES) ×3 IMPLANT
KIT PORT POWER 8FR ISP CVUE (Port) ×2 IMPLANT
NDL ENTRY 21GA 7CM ECHOTIP (NEEDLE) IMPLANT
NEEDLE ENTRY 21GA 7CM ECHOTIP (NEEDLE) ×3 IMPLANT
PACK ANGIOGRAPHY (CUSTOM PROCEDURE TRAY) ×3 IMPLANT
PAD GROUND ADULT SPLIT (MISCELLANEOUS) ×2 IMPLANT
PENCIL ELECTRO HAND CTR (MISCELLANEOUS) ×2 IMPLANT
SET INTRO CAPELLA COAXIAL (SET/KITS/TRAYS/PACK) ×2 IMPLANT
SPONGE XRAY 4X4 16PLY STRL (MISCELLANEOUS) ×2 IMPLANT
SUT MNCRL AB 4-0 PS2 18 (SUTURE) ×3 IMPLANT
SUT PROLENE 0 CT 1 30 (SUTURE) ×3 IMPLANT
SUT VICRYL+ 3-0 36IN CT-1 (SUTURE) ×2 IMPLANT
TOWEL OR 17X26 4PK STRL BLUE (TOWEL DISPOSABLE) ×2 IMPLANT

## 2019-06-06 NOTE — Progress Notes (Signed)
ANTICOAGULATION CONSULT NOTE - Initial Consult  Pharmacy Consult for Heparin  Indication: DVT  No Known Allergies  Patient Measurements: Height: 5\' 2"  (157.5 cm) Weight: 116 lb 13.5 oz (53 kg) IBW/kg (Calculated) : 50.1 Heparin Dosing Weight:  53 kg   Vital Signs: Temp: 99.6 F (37.6 C) (07/29 0436) Temp Source: Oral (07/28 2245) BP: 128/75 (07/29 0436) Pulse Rate: 85 (07/29 0436)  Labs: Recent Labs    06/05/19 1058 06/05/19 1425 06/05/19 2121 06/06/19 0500  HGB 8.8*  --   --   --   HCT 27.4*  --   --   --   PLT 184  --   --   --   APTT  --  27  --   --   LABPROT  --  14.2  --   --   INR  --  1.1  --   --   HEPARINUNFRC  --   --  <0.10* <0.10*  CREATININE  --  1.05*  --   --     Estimated Creatinine Clearance: 50.7 mL/min (A) (by C-G formula based on SCr of 1.05 mg/dL (H)).   Medical History: Past Medical History:  Diagnosis Date  . Cancer ([place])    cwervical cancer  . No pertinent past medical history     Medications:  Medications Prior to Admission  Medication Sig Dispense Refill Last Dose  . meloxicam (MOBIC) 7.5 MG tablet Take 7.5 mg by mouth daily.   06/05/2019 at 0600  . methocarbamol (ROBAXIN) 500 MG tablet Take 1 tablet (500 mg total) by mouth 2 (two) times daily as needed for muscle spasms (for lower back pain). 120 tablet 1 06/05/2019 at 0600  . cyclobenzaprine (FLEXERIL) 10 MG tablet Take 1 tablet by mouth 1 day or 1 dose.   Completed Course at Unknown time  . HYDROcodone-acetaminophen (NORCO) 5-325 MG tablet Take 1 tablet by mouth every 6 (six) hours as needed for moderate pain. (Patient not taking: Reported on 05/31/2019) 45 tablet 0   . ondansetron (ZOFRAN) 8 MG tablet Take 1 tablet (8 mg total) by mouth 2 (two) times daily as needed. Start on the third day after chemotherapy. 30 tablet 2 unknown at prn  . prochlorperazine (COMPAZINE) 10 MG tablet Take 1 tablet (10 mg total) by mouth every 6 (six) hours as needed (Nausea or vomiting). (Patient not  taking: Reported on 06/05/2019) 60 tablet 2 Not Taking at Unknown time    Assessment: Pharmacy has been consulted to initiate Heparin Drip on 52yo patient admitted with DVT in left leg. Patient has no prior history of anticoagulant use. Baseline labs have been ordered and are pending.  Goal of Therapy:  Heparin level 0.3-0.7 units/ml Monitor platelets by anticoagulation protocol: Yes   Plan:  07/29 @ 0500 HL < 0.10 subtherapeutic x 2. Will rebolus w/ heparin 1600 units IV x 1 and increase rate to 1200 units/hr and will recheck HL @ 1300, Hgb was low 6 days ago at 7.4 >> 6.6 and is s/p 1 unit pRBCs on 07/24. Will continue to monitor.  Tobie Lords, PharmD, BCPS Clinical Pharmacist 06/06/2019,7:06 AM

## 2019-06-06 NOTE — Consult Note (Signed)
[place]  [phone] Fax:(336) [phone]  ID: Mariah Mcmahon OB: 01/23/1966  MR#: [id number]  XIP#:[id number]  Patient Care Team: Venita Lick, NP as PCP - General (Nurse Practitioner) Clent Jacks, RN as Oncology Nurse Navigator  CHIEF COMPLAINT: Stage IIIb cervical cancer, now with DVT/PE.  INTERVAL HISTORY: Patient was unavailable for consultation.  She is a 53 year old female who is actively undergoing treatment with weekly cisplatin along with daily XRT for the above-stated cervical cancer.  She presented with massive swelling in her left lower extremity as well as increased shortness of breath.  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  Appreciate vascular surgery input.  Patient previously had abnormal vaginal bleeding which had resolved, but by report has restarted significantly secondary to necessary anticoagulation.  REVIEW OF SYSTEMS:   Review of Systems  Unable to perform ROS: Other    PAST MEDICAL HISTORY: Past Medical History:  Diagnosis Date   Cancer ([place])    cwervical cancer   No pertinent past medical history     PAST SURGICAL HISTORY: Past Surgical History:  Procedure Laterality Date   MASS EXCISION     Throat   PERIPHERAL VASCULAR THROMBECTOMY Left 06/05/2019   Procedure: PERIPHERAL VASCULAR THROMBECTOMY WITH IVC FILTER (LEFT LOWER EXTREMITY);  Surgeon: Katha Cabal, MD;  Location: [facility];  Service: Cardiovascular;  Laterality: Left;   TONSILLECTOMY      FAMILY HISTORY: Family History  Problem Relation Age of Onset   Brain cancer Mother 54   Alcohol abuse Father    Heart disease Father    Prostate cancer Father    Heart attack Father    Diabetes Father     ADVANCED DIRECTIVES (Y/N):  @ADVDIR @  HEALTH MAINTENANCE: Social History   Tobacco Use   Smoking status: Current Every Day Smoker    Packs/day: 0.50    Years: 30.00   Pack years: 15.00    Types: Cigarettes   Smokeless tobacco: Never Used  Substance Use Topics   Alcohol use: Not Currently   Drug use: Never     Colonoscopy:  PAP:  Bone density:  Lipid panel:  No Known Allergies  Current Facility-Administered Medications  Medication Dose Route Frequency Provider Last Rate Last Dose   0.9 %  sodium chloride infusion (Manually program via Guardrails IV Fluids)   Intravenous Once Schnier, Dolores Lory, MD       0.9 %  sodium chloride infusion   Intravenous PRN Schnier, Dolores Lory, MD       acetaminophen (TYLENOL) tablet 650 mg  650 mg Oral Q6H PRN Schnier, Dolores Lory, MD   650 mg at 06/05/19 2155   ceFAZolin (ANCEF) 2-4 GM/100ML-% IVPB            fentaNYL (SUBLIMAZE) 100 MCG/2ML injection            fentaNYL (SUBLIMAZE) 100 MCG/2ML injection            HYDROcodone-acetaminophen (NORCO/VICODIN) 5-325 MG per tablet 1 tablet  1 tablet Oral Q6H PRN Schnier, Dolores Lory, MD   1 tablet at 06/06/19 6734   HYDROmorphone (DILAUDID) injection 1 mg  1 mg Intravenous Once PRN Schnier, Dolores Lory, MD       methocarbamol (ROBAXIN) tablet 500 mg  500 mg Oral BID PRN Delana Meyer Dolores Lory, MD   500 mg at 06/06/19 0928   midazolam (VERSED) 2 MG/2ML injection  morphine 4 MG/ML injection 2-4 mg  2-4 mg Intravenous Q2H PRN Schnier, Dolores Lory, MD   2 mg at 06/06/19 0457   ondansetron (ZOFRAN) tablet 4 mg  4 mg Oral Q6H PRN Schnier, Dolores Lory, MD       Or   ondansetron [facility]) injection 4 mg  4 mg Intravenous Q6H PRN Schnier, Dolores Lory, MD       senna-docusate (Senokot-S) tablet 1 tablet  1 tablet Oral QHS PRN Katha Cabal, MD        OBJECTIVE: Vitals:   06/06/19 1800 06/06/19 1815  BP: 131/65 135/70  Pulse: 90 91  Resp: 20 16  Temp:    SpO2: 96% 99%     Body mass index is 21.37 kg/m.    ECOG FS:1 - Symptomatic but completely ambulatory  LAB RESULTS:  Lab Results  Component Value Date   NA 135 06/05/2019   K 3.7 06/05/2019   CL 102  06/05/2019   CO2 23 06/05/2019   GLUCOSE 97 06/05/2019   BUN 19 06/05/2019   CREATININE 1.05 (H) 06/05/2019   CALCIUM 8.1 (L) 06/05/2019   PROT 6.4 (L) 06/05/2019   ALBUMIN 2.9 (L) 06/05/2019   AST 14 (L) 06/05/2019   ALT 9 06/05/2019   ALKPHOS 85 06/05/2019   BILITOT 0.4 06/05/2019   GFRNONAA >60 06/05/2019   GFRAA >60 06/05/2019    Lab Results  Component Value Date   WBC 4.5 06/06/2019   NEUTROABS 5.2 06/05/2019   HGB 7.0 (L) 06/06/2019   HCT 22.0 (L) 06/06/2019   MCV 94.4 06/06/2019   PLT 173 06/06/2019     STUDIES: Dg Chest 1 View  Result Date: 06/05/2019 CLINICAL DATA:  Shortness of breath and leg swelling. EXAM: CHEST  1 VIEW COMPARISON:  Chest CT 05/01/2019 FINDINGS: The cardiac silhouette, mediastinal and hilar contours are within normal limits and stable. The lungs are clear of an acute process. No infiltrates, edema or effusions. No worrisome pulmonary lesions. The bony thorax is intact. IMPRESSION: No acute cardiopulmonary findings and no worrisome pulmonary lesions. Electronically Signed   By: Marijo Sanes M.D.   On: 06/05/2019 11:39   Ct Angio Chest Pe W And/or Wo Contrast  Result Date: 06/05/2019 CLINICAL DATA:  Shortness of breath. Lower extremity edema. Stage IIIB squamous cell cervical carcinoma EXAM: CT ANGIOGRAPHY CHEST WITH CONTRAST TECHNIQUE: Multidetector CT imaging of the chest was performed using the standard protocol during bolus administration of intravenous contrast. Multiplanar CT image reconstructions and MIPs were obtained to evaluate the vascular anatomy. CONTRAST:  20mL OMNIPAQUE IOHEXOL 350 MG/ML SOLN COMPARISON:  PET-CT May 07, 2019 FINDINGS: Cardiovascular: There are several peripheral left lower lobe pulmonary artery emboli, most notably in the anterior, lateral, and posterior segment left lower lobe pulmonary artery branches. No more central pulmonary embolus evident. There is no demonstrable right heart strain. No thoracic aortic aneurysm or  dissection. Visualized great vessels appear unremarkable. No pericardial effusion or pericardial thickening evident. Mediastinum/Nodes: Thyroid appears unremarkable. There are several mildly prominent retrocrural lymph nodes, largest measuring 1.2 x 1.0 cm. No adenopathy elsewhere on this study. No esophageal lesions evident. Lungs/Pleura: There is atelectatic change in each lung base with suspected superimposed pneumonia in the posterior left base. No appreciable pleural effusion. On axial slice 40 series 6, there is a 2 mm nodular opacity in the anterior segment of the right upper lobe. Upper Abdomen: Visualized upper abdominal structures appear unremarkable except for a degree of retrocrural adenopathy seen on recent PET  study. Musculoskeletal: There are no blastic or lytic bone lesions. No evident chest wall lesions. Review of the MIP images confirms the above findings. IMPRESSION: 1. Several small peripheral left lower lobe pulmonary emboli. No more central pulmonary embolus. No right heart strain. 2.  No thoracic aortic aneurysm or dissection. 3. Bibasilar atelectasis with suspected mild pneumonia posterior left base. 2 mm nodular opacity right upper lobe which must be concerning for small potential metastasis given known cervical carcinoma. 3. There is a degree of retrocrural adenopathy, noted on recent PET study. Critical Value/emergent results were called by telephone at the time of interpretation on 06/05/2019 at 4:42 pm to Dr. Nicholes Mango , who verbally acknowledged these results. Electronically Signed   By: Lowella Grip III M.D.   On: 06/05/2019 16:42   US Venous Img Lower Unilateral Left  Result Date: 06/05/2019 CLINICAL DATA:  Left lower extremity edema. History of smoking and malignancy. Evaluate for DVT. EXAM: LEFT LOWER EXTREMITY VENOUS DOPPLER ULTRASOUND TECHNIQUE: Gray-scale sonography with graded compression, as well as color Doppler and duplex ultrasound were performed to evaluate the  lower extremity deep venous systems from the level of the common femoral vein and including the common femoral, femoral, profunda femoral, popliteal and calf veins including the posterior tibial, peroneal and gastrocnemius veins when visible. The superficial great saphenous vein was also interrogated. Spectral Doppler was utilized to evaluate flow at rest and with distal augmentation maneuvers in the common femoral, femoral and popliteal veins. COMPARISON:  None. FINDINGS: Contralateral Common Femoral Vein: Respiratory phasicity is normal and symmetric with the symptomatic side. No evidence of thrombus. Normal compressibility. There is mixed echogenic expansile occlusive thrombus extending from the left common femoral vein (image 8) through the saphenofemoral junction and proximal aspect of the greater saphenous vein (image 12) as well as the imaged aspects of the left deep femoral vein (image 15). There is hypoechoic occlusive thrombus involving the proximal (image 19), mid (image 22) and distal (image 26) aspects of the left femoral vein, extending to involve the proximal (image 30) and distal (image 34) aspects of the popliteal vein as well as both paired left posterior tibial and peroneal veins. Other Findings:  None. IMPRESSION: Examination is positive for extensive acute occlusive DVT extending from the left common femoral vein through the left popliteal veins. Electronically Signed   By: Sandi Mariscal M.D.   On: 06/05/2019 12:20    ASSESSMENT: Stage IIIb cervical cancer, now with DVT/PE.  PLAN:   1.  DVT/PE: Appreciate vascular surgery input patient is status post thrombectomy as well as IVC filter placement.  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  Patients with cervical cancer are also at higher risk for blood clot.  Continue heparin drip and can consider transition to Eliquis upon discharge. 2.  Anemia: By report patient is now actively bleeding and hemoglobin has decreased to approximately  7.0.  Agree with 1 unit packed red blood cells.  Continue to monitor CBC closely. 3. Stage IIIb cervical cancer: Biopsy results from April 24, 2019 confirmed the diagnosis.  PET scan results from May 07, 2019 reviewed independently with extensive nodal disease as well as mass-effect causing possible hydronephrosis.  Case discussed with gynecology oncology as well as radiation oncology.  Patient is not a surgical candidate as of right now, but will likely benefit from daily XRT along with concurrent weekly cisplatin 40 mg/m2.  Her last chemotherapy was approximately 1 week ago.  Her next treatment is scheduled for Thursday, but this will likely  be delayed until her acute issues resolved. 4.  Hydronephrosis: Seen on CT scan.  Monitor closely. 5.  Venous access: Patient had port placement today.   Patient consult, will follow.  Cancer Staging Cervical cancer [facility]) Staging form: Cervix Uteri, AJCC 8th Edition - Clinical: FIGO Stage IIIB (cT3b, cN1, cM0) - Signed by Lloyd Huger, MD on 05/09/2019   Lloyd Huger, MD   06/06/2019 6:27 PM

## 2019-06-06 NOTE — Progress Notes (Signed)
Patient clinically stable post port cath placement per Dr Delana Meyer. Vitals stable. Denies complaints. Taking po's without difficulty,.report called to care nurse with questions answered.

## 2019-06-06 NOTE — Progress Notes (Addendum)
ANTICOAGULATION CONSULT NOTE - Initial Consult  Pharmacy Consult for Heparin  Indication: DVT  No Known Allergies  Patient Measurements: Height: 5\' 2"  (157.5 cm) Weight: 116 lb 13.5 oz (53 kg) IBW/kg (Calculated) : 50.1 Heparin Dosing Weight:  53 kg   Vital Signs: Temp: 99.8 F (37.7 C) (07/29 1610) Temp Source: Oral (07/29 1610) BP: 135/70 (07/29 1815) Pulse Rate: 91 (07/29 1815)  Labs: Recent Labs    06/05/19 1058 06/05/19 1425 06/05/19 2121 06/06/19 0500 06/06/19 1359  HGB 8.8*  --   --   --  7.0*  HCT 27.4*  --   --   --  22.0*  PLT 184  --   --   --  173  APTT  --  27  --   --   --   LABPROT  --  14.2  --   --   --   INR  --  1.1  --   --   --   HEPARINUNFRC  --   --  <0.10* <0.10* <0.10*  CREATININE  --  1.05*  --   --   --     Estimated Creatinine Clearance: 50.7 mL/min (A) (by C-G formula based on SCr of 1.05 mg/dL (H)).   Medical History: Past Medical History:  Diagnosis Date  . Cancer ([place])    cwervical cancer  . No pertinent past medical history     Medications:  Medications Prior to Admission  Medication Sig Dispense Refill Last Dose  . meloxicam (MOBIC) 7.5 MG tablet Take 7.5 mg by mouth daily.   06/05/2019 at 0600  . methocarbamol (ROBAXIN) 500 MG tablet Take 1 tablet (500 mg total) by mouth 2 (two) times daily as needed for muscle spasms (for lower back pain). 120 tablet 1 06/05/2019 at 0600  . cyclobenzaprine (FLEXERIL) 10 MG tablet Take 1 tablet by mouth 1 day or 1 dose.   Completed Course at Unknown time  . HYDROcodone-acetaminophen (NORCO) 5-325 MG tablet Take 1 tablet by mouth every 6 (six) hours as needed for moderate pain. (Patient not taking: Reported on 05/31/2019) 45 tablet 0   . ondansetron (ZOFRAN) 8 MG tablet Take 1 tablet (8 mg total) by mouth 2 (two) times daily as needed. Start on the third day after chemotherapy. 30 tablet 2 unknown at prn  . prochlorperazine (COMPAZINE) 10 MG tablet Take 1 tablet (10 mg total) by mouth every 6  (six) hours as needed (Nausea or vomiting). (Patient not taking: Reported on 06/05/2019) 60 tablet 2 Not Taking at Unknown time    Assessment: Pharmacy has been consulted to initiate Heparin Drip on 53yo patient admitted with DVT in left leg. Patient has no prior history of anticoagulant use. Baseline labs have been ordered and are pending.  Goal of Therapy:  Heparin level 0.3-0.7 units/ml Monitor platelets by anticoagulation protocol: Yes   Plan:  07/29 @ 1359 HL < 0.10 subtherapeutic. Per surgeon, will hold off on rebolus as hemoglobin continues to drop and recent surgical procedure, but will increase rate to 1400 units/hr to start at roughly 2000 and will recheck HL @ 0400. Hgb has remained low(7.0 today), received transfusion or 1 PRBC today. Will continue to monitor.  Pearla Dubonnet, PharmD Clinical Pharmacist 06/06/2019 6:30 PM

## 2019-06-06 NOTE — Op Note (Signed)
OPERATIVE NOTE   PROCEDURE: 1. Placement of a right IJ Infuse-a-Port  PRE-OPERATIVE DIAGNOSIS: Cervical CA  POST-OPERATIVE DIAGNOSIS: Same  SURGEON: Katha Cabal M.D.  ANESTHESIA: Conscious sedation was administered under my direct supervision by the interventional radiology RN. IV Versed plus fentanyl were utilized. Continuous ECG, pulse oximetry and blood pressure was monitored throughout the entire procedure. Conscious sedation was for a total of 35 minutes.  ESTIMATED BLOOD LOSS: Minimal   FINDING(S): 1.  Patent vein  SPECIMEN(S): None  INDICATIONS:   Mariah Mcmahon is a 53 y.o. female who presents with DVT of the left leg associated with metastatic cervical carcinoma.  From the onset it was extraordinarily difficult to secure adequate peripheral IV access.  She is currently receiving chemotherapy and she discussed with me the last round of chemotherapy was exceedingly hard on her because of her poor IV access.  The past she did not warm up to the idea of an Infuse-a-Port but at this point in time she is requested where the port could be placed.  The risk and benefits were reviewed all questions been answered patient agrees to proceed.  DESCRIPTION: After obtaining full informed written consent, the patient was brought back to the special procedure suite and placed in the supine position. The patient's right neck and chest wall are prepped and draped in sterile fashion. Appropriate timeout was called.  Ultrasound is placed in a sterile sleeve, ultrasound is utilized to avoid vascular injury as well as secondary to lack of appropriate landmarks. The right internal jugular vein is identified. It is echolucent and homogeneous as well as easily compressible indicating patency. An image is recorded for the permanent record.  Access to the vein with a micropuncture needle is done under direct ultrasound visualization.  1% lidocaine is infiltrated into the soft tissue at the base of the  neck as well as on the chest wall.  Under direct ultrasound visualization a micro-needle is inserted into the vein followed by the micro-wire. Micro-sheath was then advanced and a J wire is inserted without difficulty under fluoroscopic guidance. A small counterincision was created at the wire insertion site. A transverse incision is created 2 fingerbreadths below the scapula and a pocket is fashioned using both blunt and sharp dissection. The pocket is tested for appropriate size with the hub of the Infuse-a-Port. The tunneling device is then used to pull the intravascular portion of the catheter from the pocket to the neck counterincision.  Dilator and peel-away sheath were then inserted over the wire and the wire is removed. Catheter is then advanced into the venous system without difficulty. Peel-away sheath was then removed.  Catheter is then positioned under fluoroscopic guidance at the atrial caval junction. It is then transected connected to the hub and the hope is slipped into the subcutaneous pocket on the chest wall. The hub was then accessed percutaneously and aspirates easily and flushes well and is flushed with 30 cc of heparinized saline. The pocket incision is then closed in layers using interrupted 3-0 Vicryl for the subcutaneous tissues and 4-0 Monocryl subcuticular for skin closure. Dermabond is applied. The neck counterincision was closed with 4-0 Monocryl subcuticular and Dermabond as well.  The patient tolerated the procedure well and there were no immediate complications.  COMPLICATIONS: None  CONDITION: Unchanged  Katha Cabal M.D. [place] [facility] Office: [phone]   06/06/2019, 5:47 PM

## 2019-06-06 NOTE — Progress Notes (Signed)
   06/06/19 1830  Vitals  Temp (!) 100.8 F (38.2 C)  Temp Source Oral  BP 131/73  MAP (mmHg) 89  BP Location Left Arm  BP Method Automatic  Patient Position (if appropriate) Lying  Pulse Rate 90  Resp 14  Oxygen Therapy  SpO2 100 %  MEWS Score  MEWS RR 0  MEWS Pulse 0  MEWS Systolic 0  MEWS LOC 0  MEWS Temp 1  MEWS Score 1  MEWS Score Color Green  Made Dr. Posey Pronto aware that patient has a low grade temp.  Patient is to receive 1un pRBC for hgb 7.  Dose of tylenol already given.  Verbal order from Dr. Posey Pronto to go ahead and give blood.  Debi, RN made aware.  Clarise Cruz, BSN

## 2019-06-06 NOTE — Progress Notes (Signed)
[facility] NAME: Mariah Mcmahon    MR#:  [id number]  DATE OF BIRTH:  1966/03/11  SUBJECTIVE:   Patient presented to the hospital secondary to an extensive left lower extremity DVT.  Patient is status post IVC filter placement and thrombolysis.  Denies any complaints.  Scheduled to get a Port-A-Cath placement for chemotherapy today.  REVIEW OF SYSTEMS:    Review of Systems  Constitutional: Negative for chills and fever.  HENT: Negative for congestion and tinnitus.   Eyes: Negative for blurred vision and double vision.  Respiratory: Negative for cough, shortness of breath and wheezing.   Cardiovascular: Negative for chest pain, orthopnea and PND.  Gastrointestinal: Negative for abdominal pain, diarrhea, nausea and vomiting.  Genitourinary: Negative for dysuria and hematuria.  Neurological: Negative for dizziness, sensory change and focal weakness.  All other systems reviewed and are negative.   Nutrition: NPO for port-a-cath placement Tolerating Diet: Yes Tolerating PT: Await Eval.    DRUG ALLERGIES:  No Known Allergies  VITALS:  Blood pressure 128/75, pulse 85, temperature 99.6 F (37.6 C), resp. rate 20, height 5\' 2"  (1.575 m), weight 53 kg, last menstrual period 03/14/2019, SpO2 99 %.  PHYSICAL EXAMINATION:   Physical Exam  GENERAL:  53 y.o.-year-old patient lying in bed in no acute distress.  EYES: Pupils equal, round, reactive to light and accommodation. No scleral icterus. Extraocular muscles intact.  HEENT: Head atraumatic, normocephalic. Oropharynx and nasopharynx clear.  NECK:  Supple, no jugular venous distention. No thyroid enlargement, no tenderness.  LUNGS: Normal breath sounds bilaterally, no wheezing, rales, rhonchi. No use of accessory muscles of respiration.  CARDIOVASCULAR: S1, S2 normal. No murmurs, rubs, or gallops.  ABDOMEN: Soft, nontender, nondistended. Bowel sounds present. No organomegaly or mass.   EXTREMITIES: No cyanosis, clubbing or edema b/l.  Left lower extremity wrapped in dressing from recent thrombectomy and IVC filter placement. NEUROLOGIC: Cranial nerves II through XII are intact. No focal Motor or sensory deficits b/l.   PSYCHIATRIC: The patient is alert and oriented x 3.  SKIN: No obvious rash, lesion, or ulcer.    LABORATORY PANEL:   CBC Recent Labs  Lab 06/06/19 1359  WBC 4.5  HGB 7.0*  HCT 22.0*  PLT 173   ------------------------------------------------------------------------------------------------------------------  Chemistries  Recent Labs  Lab 06/05/19 1425  NA 135  K 3.7  CL 102  CO2 23  GLUCOSE 97  BUN 19  CREATININE 1.05*  CALCIUM 8.1*  AST 14*  ALT 9  ALKPHOS 85  BILITOT 0.4   ------------------------------------------------------------------------------------------------------------------  Cardiac Enzymes No results for input(s): TROPONINI in the last 168 hours. ------------------------------------------------------------------------------------------------------------------  RADIOLOGY:  Dg Chest 1 View  Result Date: 06/05/2019 CLINICAL DATA:  Shortness of breath and leg swelling. EXAM: CHEST  1 VIEW COMPARISON:  Chest CT 05/01/2019 FINDINGS: The cardiac silhouette, mediastinal and hilar contours are within normal limits and stable. The lungs are clear of an acute process. No infiltrates, edema or effusions. No worrisome pulmonary lesions. The bony thorax is intact. IMPRESSION: No acute cardiopulmonary findings and no worrisome pulmonary lesions. Electronically Signed   By: Marijo Sanes M.D.   On: 06/05/2019 11:39   Ct Angio Chest Pe W And/or Wo Contrast  Result Date: 06/05/2019 CLINICAL DATA:  Shortness of breath. Lower extremity edema. Stage IIIB squamous cell cervical carcinoma EXAM: CT ANGIOGRAPHY CHEST WITH CONTRAST TECHNIQUE: Multidetector CT imaging of the chest was performed using the standard protocol during bolus  administration of  intravenous contrast. Multiplanar CT image reconstructions and MIPs were obtained to evaluate the vascular anatomy. CONTRAST:  31mL OMNIPAQUE IOHEXOL 350 MG/ML SOLN COMPARISON:  PET-CT May 07, 2019 FINDINGS: Cardiovascular: There are several peripheral left lower lobe pulmonary artery emboli, most notably in the anterior, lateral, and posterior segment left lower lobe pulmonary artery branches. No more central pulmonary embolus evident. There is no demonstrable right heart strain. No thoracic aortic aneurysm or dissection. Visualized great vessels appear unremarkable. No pericardial effusion or pericardial thickening evident. Mediastinum/Nodes: Thyroid appears unremarkable. There are several mildly prominent retrocrural lymph nodes, largest measuring 1.2 x 1.0 cm. No adenopathy elsewhere on this study. No esophageal lesions evident. Lungs/Pleura: There is atelectatic change in each lung base with suspected superimposed pneumonia in the posterior left base. No appreciable pleural effusion. On axial slice 40 series 6, there is a 2 mm nodular opacity in the anterior segment of the right upper lobe. Upper Abdomen: Visualized upper abdominal structures appear unremarkable except for a degree of retrocrural adenopathy seen on recent PET study. Musculoskeletal: There are no blastic or lytic bone lesions. No evident chest wall lesions. Review of the MIP images confirms the above findings. IMPRESSION: 1. Several small peripheral left lower lobe pulmonary emboli. No more central pulmonary embolus. No right heart strain. 2.  No thoracic aortic aneurysm or dissection. 3. Bibasilar atelectasis with suspected mild pneumonia posterior left base. 2 mm nodular opacity right upper lobe which must be concerning for small potential metastasis given known cervical carcinoma. 3. There is a degree of retrocrural adenopathy, noted on recent PET study. Critical Value/emergent results were called by telephone at the time  of interpretation on 06/05/2019 at 4:42 pm to Dr. Nicholes Mango , who verbally acknowledged these results. Electronically Signed   By: Lowella Grip III M.D.   On: 06/05/2019 16:42   US Venous Img Lower Unilateral Left  Result Date: 06/05/2019 CLINICAL DATA:  Left lower extremity edema. History of smoking and malignancy. Evaluate for DVT. EXAM: LEFT LOWER EXTREMITY VENOUS DOPPLER ULTRASOUND TECHNIQUE: Gray-scale sonography with graded compression, as well as color Doppler and duplex ultrasound were performed to evaluate the lower extremity deep venous systems from the level of the common femoral vein and including the common femoral, femoral, profunda femoral, popliteal and calf veins including the posterior tibial, peroneal and gastrocnemius veins when visible. The superficial great saphenous vein was also interrogated. Spectral Doppler was utilized to evaluate flow at rest and with distal augmentation maneuvers in the common femoral, femoral and popliteal veins. COMPARISON:  None. FINDINGS: Contralateral Common Femoral Vein: Respiratory phasicity is normal and symmetric with the symptomatic side. No evidence of thrombus. Normal compressibility. There is mixed echogenic expansile occlusive thrombus extending from the left common femoral vein (image 8) through the saphenofemoral junction and proximal aspect of the greater saphenous vein (image 12) as well as the imaged aspects of the left deep femoral vein (image 15). There is hypoechoic occlusive thrombus involving the proximal (image 19), mid (image 22) and distal (image 26) aspects of the left femoral vein, extending to involve the proximal (image 30) and distal (image 34) aspects of the popliteal vein as well as both paired left posterior tibial and peroneal veins. Other Findings:  None. IMPRESSION: Examination is positive for extensive acute occlusive DVT extending from the left common femoral vein through the left popliteal veins. Electronically Signed    By: Sandi Mariscal M.D.   On: 06/05/2019 12:20     ASSESSMENT AND PLAN:   53 year old  female with past medical history of cervical cancer currently ongoing chemotherapy who presented to the hospital due to significant left lower extremity swelling and pain underwent a ultrasound which showed an extensive left lower extremity DVT.  1.  Left lower extremity DVT/pulmonary embolism-as the cause of patient's swelling and pain.  Patient noted to have a extensive DVT from the left common femoral vein to the left popliteal vein. -Seen by vascular surgery and status post thrombolysis and also IVC filter placement. - Continue heparin drip for now. -This is likely a provoked VTE given patient's underlying malignancy.  2.  Anemia-this is acute blood loss anemia secondary to mild cervical bleeding. -Patient noted to have a PE and DVT and is status post IVC filter placement now.  Continue heparin drip, will transfuse 1 unit of packed red blood cells today. -Follow hemoglobin.  3.  History of cervical cancer currently ongoing chemotherapy- patient is followed by Dr. Grayland Ormond. -Continue care as per them.  Patient is going for Port-A-Cath placement today.  4. Chronic pain due to malignancy - cont. Robaxin, Vicodin.   All the records are reviewed and case discussed with Care Management/Social Worker. Management plans discussed with the patient, family and they are in agreement.  CODE STATUS: Full code  DVT Prophylaxis: Heparin gtt  TOTAL TIME TAKING CARE OF THIS PATIENT: 30 minutes.   POSSIBLE D/C IN 1-2 DAYS, DEPENDING ON CLINICAL CONDITION.   Henreitta Leber M.D on 06/06/2019 at 2:25 PM  Between 7am to 6pm - Pager - [phone]  After 6pm go to [email or web address] - [employer] [facility]  Office  [phone]  CC: Primary care physician; Venita Lick, NP

## 2019-06-07 ENCOUNTER — Inpatient Hospital Stay: Payer: BC Managed Care – PPO | Admitting: Oncology

## 2019-06-07 ENCOUNTER — Ambulatory Visit: Payer: BC Managed Care – PPO

## 2019-06-07 ENCOUNTER — Inpatient Hospital Stay: Payer: BC Managed Care – PPO

## 2019-06-07 ENCOUNTER — Telehealth: Payer: Self-pay | Admitting: Nurse Practitioner

## 2019-06-07 ENCOUNTER — Encounter: Payer: Self-pay | Admitting: Vascular Surgery

## 2019-06-07 DIAGNOSIS — Z95828 Presence of other vascular implants and grafts: Secondary | ICD-10-CM

## 2019-06-07 LAB — TYPE AND SCREEN
ABO/RH(D): A POS
Antibody Screen: NEGATIVE
Unit division: 0

## 2019-06-07 LAB — BPAM RBC
Blood Product Expiration Date: 202008102359
ISSUE DATE / TIME: 202007291942
Unit Type and Rh: 6200

## 2019-06-07 LAB — CBC
HCT: 23.7 % — ABNORMAL LOW (ref 36.0–46.0)
Hemoglobin: 7.9 g/dL — ABNORMAL LOW (ref 12.0–15.0)
MCH: 30.3 pg (ref 26.0–34.0)
MCHC: 33.3 g/dL (ref 30.0–36.0)
MCV: 90.8 fL (ref 80.0–100.0)
Platelets: 163 10*3/uL (ref 150–400)
RBC: 2.61 MIL/uL — ABNORMAL LOW (ref 3.87–5.11)
RDW: 15.1 % (ref 11.5–15.5)
WBC: 4.3 10*3/uL (ref 4.0–10.5)
nRBC: 0 % (ref 0.0–0.2)

## 2019-06-07 LAB — HEPARIN LEVEL (UNFRACTIONATED): Heparin Unfractionated: <0.1 IU/mL — ABNORMAL LOW (ref 0.30–0.70)

## 2019-06-07 MED ORDER — APIXABAN 5 MG PO TABS
10.0000 mg | ORAL_TABLET | Freq: Once | ORAL | Status: AC
  Administered 2019-06-07: 11:00:00 10 mg via ORAL
  Filled 2019-06-07: qty 2

## 2019-06-07 NOTE — Telephone Encounter (Signed)
Noted.  Thank you.  She may follow-up with Dr. Grayland Ormond due to her current status.  I have sent her a message through [facility] as well.

## 2019-06-07 NOTE — Plan of Care (Signed)
  Problem: Education: Goal: Knowledge of General Education information will improve Description: Including pain rating scale, medication(s)/side effects and non-pharmacologic comfort measures Outcome: Adequate for Discharge   Problem: Activity: Goal: Risk for activity intolerance will decrease Outcome: Adequate for Discharge   Problem: Nutrition: Goal: Adequate nutrition will be maintained Outcome: Adequate for Discharge   Problem: Coping: Goal: Level of anxiety will decrease Outcome: Adequate for Discharge

## 2019-06-07 NOTE — Progress Notes (Addendum)
ANTICOAGULATION CONSULT NOTE - Initial Consult  Pharmacy Consult for Heparin  Indication: DVT  No Known Allergies  Patient Measurements: Height: 5\' 2"  (157.5 cm) Weight: 116 lb 13.5 oz (53 kg) IBW/kg (Calculated) : 50.1 Heparin Dosing Weight:  53 kg   Vital Signs: Temp: 99.2 F (37.3 C) (07/29 2315) Temp Source: Oral (07/29 1930) BP: 117/67 (07/29 2315) Pulse Rate: 84 (07/29 2315)  Labs: Recent Labs    06/05/19 1058 06/05/19 1425  06/06/19 0500 06/06/19 1359 06/07/19 0154  HGB 8.8*  --   --   --  7.0* 7.9*  HCT 27.4*  --   --   --  22.0* 23.7*  PLT 184  --   --   --  173 163  APTT  --  27  --   --   --   --   LABPROT  --  14.2  --   --   --   --   INR  --  1.1  --   --   --   --   HEPARINUNFRC  --   --    < > <0.10* <0.10* <0.10*  CREATININE  --  1.05*  --   --   --   --    < > = values in this interval not displayed.    Estimated Creatinine Clearance: 50.7 mL/min (A) (by C-G formula based on SCr of 1.05 mg/dL (H)).   Medical History: Past Medical History:  Diagnosis Date  . Cancer ([place])    cwervical cancer  . No pertinent past medical history     Medications:  Medications Prior to Admission  Medication Sig Dispense Refill Last Dose  . meloxicam (MOBIC) 7.5 MG tablet Take 7.5 mg by mouth daily.   06/05/2019 at 0600  . methocarbamol (ROBAXIN) 500 MG tablet Take 1 tablet (500 mg total) by mouth 2 (two) times daily as needed for muscle spasms (for lower back pain). 120 tablet 1 06/05/2019 at 0600  . cyclobenzaprine (FLEXERIL) 10 MG tablet Take 1 tablet by mouth 1 day or 1 dose.   Completed Course at Unknown time  . HYDROcodone-acetaminophen (NORCO) 5-325 MG tablet Take 1 tablet by mouth every 6 (six) hours as needed for moderate pain. (Patient not taking: Reported on 05/31/2019) 45 tablet 0   . ondansetron (ZOFRAN) 8 MG tablet Take 1 tablet (8 mg total) by mouth 2 (two) times daily as needed. Start on the third day after chemotherapy. 30 tablet 2 unknown at prn  .  prochlorperazine (COMPAZINE) 10 MG tablet Take 1 tablet (10 mg total) by mouth every 6 (six) hours as needed (Nausea or vomiting). (Patient not taking: Reported on 06/05/2019) 60 tablet 2 Not Taking at Unknown time    Assessment: Pharmacy has been consulted to initiate Heparin Drip on 53yo patient admitted with DVT in left leg. Patient has no prior history of anticoagulant use. Baseline labs have been ordered and are pending.  Goal of Therapy:  Heparin level 0.3-0.7 units/ml Monitor platelets by anticoagulation protocol: Yes   Plan:  07/30 @ 0200 HL < 0.10 still subtherapeutic after m/p boluses and rate increases. Per RN confirmed drip has been running w/ the exception of it being held yesterday for placement of right IJ. Per RN patient not bleeding w/ exception of some vaginal bleeding probably s/t cervical CA. Will omit bolus since patient is anemic at baseline requiring transfusions s/p 1 unit pRBCs again on 07/29 for hgb of 7.0 >> 7.9 post-transfusion and will  increase rate to 1650 units/hr and will recheck HL @ 1000, will continue to monitor  Tobie Lords, PharmD Clinical Pharmacist 06/07/2019 3:45 AM

## 2019-06-07 NOTE — Progress Notes (Signed)
MD order received in [facility] to discharge pt home today; verbally reviewed AVS with pt, gave Rx for Eliquis to pt; no questions voiced at this time; pt's discharge pending arrival of her son at the [place] entrance

## 2019-06-07 NOTE — Progress Notes (Signed)
Pt's ride present at [facility]; pt discharged via wheelchair by nursing to the [facility] 

## 2019-06-07 NOTE — TOC Transition Note (Signed)
Transition of Care [facility]) - CM/SW Discharge Note   Patient Details  Name: Mariah Mcmahon MRN: [id number] Date of Birth: 09/02/1966  [facility] CM/SW Contact:  Shelbie Hutching, RN Phone Number: 06/07/2019, 11:51 AM   Clinical Narrative:     Patient discharging home today on Eliquis.  Eliquis coupon provided for 30 day free trial.  Patient has no other discharge needs.         Patient Goals and CMS Choice        Discharge Placement                       Discharge Plan and Services                                     Social Determinants of Health (SDOH) Interventions     Readmission Risk Interventions No flowsheet data found.

## 2019-06-07 NOTE — Telephone Encounter (Signed)
Called pt to schedule 1 week hospital fu per [facility], no answer, left vm to call us back

## 2019-06-07 NOTE — Progress Notes (Signed)
[facility] Daily Progress Note   Subjective: 06/05/19: 1. Ultrasound guidance for vascular access to the left common femoral vein and left popliteal vein 2. Catheter placement into the inferior vena cava for placement of IVC filter 3. Inferior venacavogram 4. Placement of a Denali IVC filter infrarenal L2 level 5. Introduction catheter into venous system second order catheter placement left popliteal approach 6. Infusion thrombolysis with 8 mg of TPA 7. Mechanical thrombectomy of the left popliteal, SFV, common femoral, external iliac and common iliac vein using the penumbra CAT 12 catheter 8.  Percutaneous transluminal angioplasty and stent placement using a 14 x 60 the Novo stent postdilated to 12 mm with Lutonix drug-eluting balloon left common iliac 9.  Placement of a right IJ triple-lumen catheter with ultrasound guidance  06/06/19: 1. Placement of a right IJ Infuse-a-Port  Patient with improvement to her left lower extremity pain and swelling.  No issues overnight.  Patient denies any pain at her newly inserted Port-A-Cath site.  Objective: Vitals:   06/06/19 1946 06/06/19 2010 06/06/19 2315 06/07/19 0503  BP: 119/72 108/70 117/67 125/71  Pulse: 91 86 84 76  Resp:  18  20  Temp: 99.6 F (37.6 C) 100 F (37.8 C) 99.2 F (37.3 C) (!) 101.1 F (38.4 C)  TempSrc:    Oral  SpO2: 99% 99% 98% 99%  Weight:      Height:        Intake/Output Summary (Last 24 hours) at 06/07/2019 1036 Last data filed at 06/07/2019 0900 Gross per 24 hour  Intake 1046.36 ml  Output -  Net 1046.36 ml   Physical Exam: A&Ox3, NAD Neck: Central line intact.  No swelling/drainage or ecchymosis noted. Chest: Right Port-A-Cath.  Site is clean dry and intact.  No swelling, drainage or ecchymosis noted.  Incision with Dermabond intact. CV: RRR Pulmonary: CTA Bilaterally Abdomen: Soft, Nontender, Nondistended Left Groin: Small noninfected blister noted.  Access  site is clean dry and intact. Vascular:  Left Lower Extremity: Thigh soft.  Calf soft.  Calf access site clean dry and intact.  Nontender to palpation.  Extremities warm distally to toes.  Motor/sensory is intact.   Laboratory: CBC    Component Value Date/Time   WBC 4.3 06/07/2019 0154   HGB 7.9 (L) 06/07/2019 0154   HGB 14.5 04/09/2019 0918   HCT 23.7 (L) 06/07/2019 0154   HCT 44.2 04/09/2019 0918   PLT 163 06/07/2019 0154   PLT 401 04/09/2019 0918   BMET    Component Value Date/Time   NA 135 06/05/2019 1425   NA 138 04/09/2019 0918   K 3.7 06/05/2019 1425   CL 102 06/05/2019 1425   CO2 23 06/05/2019 1425   GLUCOSE 97 06/05/2019 1425   BUN 19 06/05/2019 1425   BUN 12 04/09/2019 0918   CREATININE 1.05 (H) 06/05/2019 1425   CALCIUM 8.1 (L) 06/05/2019 1425   GFRNONAA >60 06/05/2019 1425   GFRAA >60 06/05/2019 1425   Assessment/Planning: The patient is a 53 year old female with stage III cervical cancer status post left lower extremity IVC filter with DVT lysis and Port-A-Cath placement 1) Transitioned to Eliquis 2) OK to discharge home at this time. 3) Follow up two weeks to continue to surveil DVT  Discussed with Dr. Eber Hong  PA-C 06/07/2019 10:36 AM

## 2019-06-07 NOTE — Discharge Summary (Signed)
[facility] NAME: Mariah Mcmahon    MR#:  [id number]  DATE OF BIRTH:  07/24/1966  DATE OF ADMISSION:  06/05/2019 ADMITTING PHYSICIAN: Nicholes Mango, MD  DATE OF DISCHARGE: 06/07/2019 12:21 PM  PRIMARY CARE PHYSICIAN: Marnee Guarneri T, NP    ADMISSION DIAGNOSIS:  Acute deep vein thrombosis (DVT) of femoral vein of left lower extremity ([place]) [I82.412]  DISCHARGE DIAGNOSIS:  Active Problems:   DVT of lower limb, acute ([place])   SECONDARY DIAGNOSIS:   Past Medical History:  Diagnosis Date  . Cancer ([place])    cwervical cancer  . No pertinent past medical history     HOSPITAL COURSE:   53 year old female with past medical history of cervical cancer currently ongoing chemotherapy who presented to the hospital due to significant left lower extremity swelling and pain underwent a ultrasound which showed an extensive left lower extremity DVT.  1.  Left lower extremity DVT/pulmonary embolism- this was the cause of patient's swelling and pain.  Patient noted to have a extensive DVT from the left common femoral vein to the left popliteal vein. -Initially initiated on heparin drip and a vascular surgery consult obtained. - pt. Was Seen by vascular surgery and is status post thrombolysis and also IVC filter placement. -Patient's left lower extremity swelling post procedure has significantly improved.  She has less pain and feels a lot better.  Now being switched over to oral Eliquis and being discharged home with outpatient follow-up with vascular surgery and hematology oncology.  2.  Anemia-this is acute blood loss anemia secondary to mild cervical bleeding. -Patient was transfused 1 unit packed red blood cells, hemoglobin stable at 7.9 today.  Patient is being anticoagulated for her DVT/PE and was advised to watch for any further cervical bleeding and if it gets worse to follow-up with her hematologist/oncologist. - follow Hg. As outpatient.    3.  History of cervical cancer currently ongoing chemotherapy- patient is followed by Dr. Grayland Ormond. -Continue care as per them.  - s/p Port-a-cath placement while in hospital and cont. Follow up with Oncology.    4. Chronic pain due to malignancy - pt. Will cont. Robaxin, Meloxicam  DISCHARGE CONDITIONS:   Stable.   CONSULTS OBTAINED:  Treatment Team:  Katha Cabal, MD Lloyd Huger, MD  DRUG ALLERGIES:  No Known Allergies  DISCHARGE MEDICATIONS:   Allergies as of 06/07/2019   No Known Allergies     Medication List    STOP taking these medications   HYDROcodone-acetaminophen 5-325 MG tablet Commonly known as: Norco   prochlorperazine 10 MG tablet Commonly known as: COMPAZINE     TAKE these medications   cyclobenzaprine 10 MG tablet Commonly known as: FLEXERIL Take 1 tablet by mouth 1 day or 1 dose.   Eliquis DVT/PE Starter Pack 5 MG Tabs Take as directed on package: start with two-5mg  tablets twice daily for 7 days. On day 8, switch to one-5mg  tablet twice daily.   meloxicam 7.5 MG tablet Commonly known as: MOBIC Take 7.5 mg by mouth daily.   methocarbamol 500 MG tablet Commonly known as: Robaxin Take 1 tablet (500 mg total) by mouth 2 (two) times daily as needed for muscle spasms (for lower back pain).   ondansetron 8 MG tablet Commonly known as: Zofran Take 1 tablet (8 mg total) by mouth 2 (two) times daily as needed. Start on the third day after chemotherapy.         DISCHARGE INSTRUCTIONS:  DIET:  Regular diet  DISCHARGE CONDITION:  Stable  ACTIVITY:  Activity as tolerated  OXYGEN:  Home Oxygen: No.   Oxygen Delivery: room air  DISCHARGE LOCATION:  home   If you experience worsening of your admission symptoms, develop shortness of breath, life threatening emergency, suicidal or homicidal thoughts you must seek medical attention immediately by calling 911 or calling your MD immediately  if symptoms less severe.  You  Must read complete instructions/literature along with all the possible adverse reactions/side effects for all the Medicines you take and that have been prescribed to you. Take any new Medicines after you have completely understood and accpet all the possible adverse reactions/side effects.   Please note  You were cared for by a hospitalist during your hospital stay. If you have any questions about your discharge medications or the care you received while you were in the hospital after you are discharged, you can call the unit and asked to speak with the hospitalist on call if the hospitalist that took care of you is not available. Once you are discharged, your primary care physician will handle any further medical issues. Please note that NO REFILLS for any discharge medications will be authorized once you are discharged, as it is imperative that you return to your primary care physician (or establish a relationship with a primary care physician if you do not have one) for your aftercare needs so that they can reassess your need for medications and monitor your lab values.     Today   Left lower extremity significantly less swollen.  She feels a lot better.  She denies any shortness of breath or chest pain.  Will discharge home today on oral Eliquis.  VITAL SIGNS:  Blood pressure 125/71, pulse 76, temperature (!) 101.1 F (38.4 C), temperature source Oral, resp. rate 20, height 5\' 2"  (1.575 m), weight 53 kg, last menstrual period 03/14/2019, SpO2 99 %.  I/O:    Intake/Output Summary (Last 24 hours) at 06/07/2019 1445 Last data filed at 06/07/2019 1117 Gross per 24 hour  Intake 1180.12 ml  Output -  Net 1180.12 ml    PHYSICAL EXAMINATION:   GENERAL:  53 y.o.-year-old patient lying in bed in no acute distress.  EYES: Pupils equal, round, reactive to light and accommodation. No scleral icterus. Extraocular muscles intact.  HEENT: Head atraumatic, normocephalic. Oropharynx and nasopharynx  clear.  NECK:  Supple, no jugular venous distention. No thyroid enlargement, no tenderness.  LUNGS: Normal breath sounds bilaterally, no wheezing, rales, rhonchi. No use of accessory muscles of respiration.  CARDIOVASCULAR: S1, S2 normal. No murmurs, rubs, or gallops.  ABDOMEN: Soft, nontender, nondistended. Bowel sounds present. No organomegaly or mass.  EXTREMITIES: No cyanosis, clubbing or edema b/l.  Left lower extremity wrapped in dressing from recent thrombectomy and IVC filter placement. NEUROLOGIC: Cranial nerves II through XII are intact. No focal Motor or sensory deficits b/l.   PSYCHIATRIC: The patient is alert and oriented x 3.  SKIN: No obvious rash, lesion, or ulcer.   DATA REVIEW:   CBC Recent Labs  Lab 06/07/19 0154  WBC 4.3  HGB 7.9*  HCT 23.7*  PLT 163    Chemistries  Recent Labs  Lab 06/05/19 1425  NA 135  K 3.7  CL 102  CO2 23  GLUCOSE 97  BUN 19  CREATININE 1.05*  CALCIUM 8.1*  AST 14*  ALT 9  ALKPHOS 85  BILITOT 0.4    Cardiac Enzymes No results for input(s): TROPONINI in  the last 168 hours.  Microbiology Results  Results for orders placed or performed during the hospital encounter of 06/05/19  SARS Coronavirus 2 (CEPHEID - Performed in [facility] hospital lab), Hosp Order     Status: None   Collection Time: 06/05/19 11:51 AM   Specimen: Nasopharyngeal Swab  Result Value Ref Range Status   SARS Coronavirus 2 NEGATIVE NEGATIVE Final    Comment: (NOTE) If result is NEGATIVE SARS-CoV-2 target nucleic acids are NOT DETECTED. The SARS-CoV-2 RNA is generally detectable in upper and lower  respiratory specimens during the acute phase of infection. The lowest  concentration of SARS-CoV-2 viral copies this assay can detect is 250  copies / mL. A negative result does not preclude SARS-CoV-2 infection  and should not be used as the sole basis for treatment or other  patient management decisions.  A negative result may occur with  improper  specimen collection / handling, submission of specimen other  than nasopharyngeal swab, presence of viral mutation(s) within the  areas targeted by this assay, and inadequate number of viral copies  (<250 copies / mL). A negative result must be combined with clinical  observations, patient history, and epidemiological information. If result is POSITIVE SARS-CoV-2 target nucleic acids are DETECTED. The SARS-CoV-2 RNA is generally detectable in upper and lower  respiratory specimens dur ing the acute phase of infection.  Positive  results are indicative of active infection with SARS-CoV-2.  Clinical  correlation with patient history and other diagnostic information is  necessary to determine patient infection status.  Positive results do  not rule out bacterial infection or co-infection with other viruses. If result is PRESUMPTIVE POSTIVE SARS-CoV-2 nucleic acids MAY BE PRESENT.   A presumptive positive result was obtained on the submitted specimen  and confirmed on repeat testing.  While 2019 novel coronavirus  (SARS-CoV-2) nucleic acids may be present in the submitted sample  additional confirmatory testing may be necessary for epidemiological  and / or clinical management purposes  to differentiate between  SARS-CoV-2 and other Sarbecovirus currently known to infect humans.  If clinically indicated additional testing with an alternate test  methodology [phone]) is advised. The SARS-CoV-2 RNA is generally  detectable in upper and lower respiratory sp ecimens during the acute  phase of infection. The expected result is Negative. Fact Sheet for Patients:  StrictlyIdeas.no Fact Sheet for Healthcare Providers: BankingDealers.co.za This test is not yet approved or cleared by the [place] FDA and has been authorized for detection and/or diagnosis of SARS-CoV-2 by FDA under an Emergency Use Authorization (EUA).  This EUA will remain in  effect (meaning this test can be used) for the duration of the COVID-19 declaration under Section 564(b)(1) of the Act, 21 U.S.C. section 360bbb-3(b)(1), unless the authorization is terminated or revoked sooner. Performed at [facility], [address], [place], [place] [address]     RADIOLOGY:  Ct Angio Chest Pe W And/or Wo Contrast  Result Date: 06/05/2019 CLINICAL DATA:  Shortness of breath. Lower extremity edema. Stage IIIB squamous cell cervical carcinoma EXAM: CT ANGIOGRAPHY CHEST WITH CONTRAST TECHNIQUE: Multidetector CT imaging of the chest was performed using the standard protocol during bolus administration of intravenous contrast. Multiplanar CT image reconstructions and MIPs were obtained to evaluate the vascular anatomy. CONTRAST:  77mL OMNIPAQUE IOHEXOL 350 MG/ML SOLN COMPARISON:  PET-CT May 07, 2019 FINDINGS: Cardiovascular: There are several peripheral left lower lobe pulmonary artery emboli, most notably in the anterior, lateral, and posterior segment left lower lobe pulmonary  artery branches. No more central pulmonary embolus evident. There is no demonstrable right heart strain. No thoracic aortic aneurysm or dissection. Visualized great vessels appear unremarkable. No pericardial effusion or pericardial thickening evident. Mediastinum/Nodes: Thyroid appears unremarkable. There are several mildly prominent retrocrural lymph nodes, largest measuring 1.2 x 1.0 cm. No adenopathy elsewhere on this study. No esophageal lesions evident. Lungs/Pleura: There is atelectatic change in each lung base with suspected superimposed pneumonia in the posterior left base. No appreciable pleural effusion. On axial slice 40 series 6, there is a 2 mm nodular opacity in the anterior segment of the right upper lobe. Upper Abdomen: Visualized upper abdominal structures appear unremarkable except for a degree of retrocrural adenopathy seen on recent PET study. Musculoskeletal: There are no blastic  or lytic bone lesions. No evident chest wall lesions. Review of the MIP images confirms the above findings. IMPRESSION: 1. Several small peripheral left lower lobe pulmonary emboli. No more central pulmonary embolus. No right heart strain. 2.  No thoracic aortic aneurysm or dissection. 3. Bibasilar atelectasis with suspected mild pneumonia posterior left base. 2 mm nodular opacity right upper lobe which must be concerning for small potential metastasis given known cervical carcinoma. 3. There is a degree of retrocrural adenopathy, noted on recent PET study. Critical Value/emergent results were called by telephone at the time of interpretation on 06/05/2019 at 4:42 pm to Dr. Nicholes Mango , who verbally acknowledged these results. Electronically Signed   By: Lowella Grip III M.D.   On: 06/05/2019 16:42      Management plans discussed with the patient, family and they are in agreement.  CODE STATUS:     Code Status Orders  (From admission, onward)         Start     Ordered   06/05/19 1358  Full code  Continuous     06/05/19 1357        Code Status History    This patient has a current code status but no historical code status.   Advance Care Planning Activity      TOTAL TIME TAKING CARE OF THIS PATIENT: 40 minutes.    Henreitta Leber M.D on 06/07/2019 at 2:45 PM  Between 7am to 6pm - Pager - [phone]  After 6pm go to [email or web address] - [employer] [facility]  Office  [phone]  CC: Primary care physician; Venita Lick, NP

## 2019-06-08 ENCOUNTER — Ambulatory Visit: Payer: BC Managed Care – PPO

## 2019-06-10 NOTE — Progress Notes (Signed)
[place]  Telephone:([phone]) [phone] Fax:(336) [phone]  ID: Mariah Mcmahon OB: 01/07/1966  MR#: [id number]  SJG#:[id number]  Patient Care Team: Venita Lick, NP as PCP - General (Nurse Practitioner) Clent Jacks, RN as Oncology Nurse Navigator  CHIEF COMPLAINT: Stage IIIb cervical cancer  INTERVAL HISTORY: Patient returns to clinic today for further evaluation and consideration of cycle 4 of weekly cisplatin.  She missed her appointment last week after being admitted to the hospital with a PE and massive DVT requiring stent placement and thrombolytics.  She is now on Eliquis.  She currently feels well and is back to her baseline.  She has no further leg swelling or pain.    She states her abnormal vaginal bleeding has resolved.  She does not complain of abdominal pain or bloating today.  She continues to be anxious.  She has no neurologic complaints.  She denies any recent fevers or illnesses.  She has a good appetite and denies weight loss.  She has no chest pain, shortness of breath, cough, or hemoptysis.  She denies any nausea, vomiting, constipation, or diarrhea.  She has no urinary complaints.  Patient offers no further specific complaints today.  REVIEW OF SYSTEMS:   Review of Systems  Constitutional: Positive for malaise/fatigue. Negative for fever and weight loss.  Respiratory: Negative.  Negative for cough and shortness of breath.   Cardiovascular: Negative.  Negative for chest pain and leg swelling.  Gastrointestinal: Negative for abdominal pain.  Genitourinary: Negative.  Negative for hematuria.  Musculoskeletal: Negative.  Negative for back pain.  Skin: Negative.  Negative for rash.  Neurological: Positive for weakness. Negative for dizziness, focal weakness and headaches.  Psychiatric/Behavioral: The patient is nervous/anxious.     As per HPI. Otherwise, a complete review of systems is negative.  PAST MEDICAL HISTORY: Past Medical History:    Diagnosis Date   Cancer ([place])    cwervical cancer   No pertinent past medical history     PAST SURGICAL HISTORY: Past Surgical History:  Procedure Laterality Date   MASS EXCISION     Throat   PERIPHERAL VASCULAR THROMBECTOMY Left 06/05/2019   Procedure: PERIPHERAL VASCULAR THROMBECTOMY WITH IVC FILTER (LEFT LOWER EXTREMITY);  Surgeon: Katha Cabal, MD;  Location: [facility];  Service: Cardiovascular;  Laterality: Left;   PORTA CATH INSERTION N/A 06/06/2019   Procedure: PORTA CATH INSERTION;  Surgeon: Katha Cabal, MD;  Location: [facility];  Service: Cardiovascular;  Laterality: N/A;   TONSILLECTOMY      FAMILY HISTORY: Family History  Problem Relation Age of Onset   Brain cancer Mother 39   Alcohol abuse Father    Heart disease Father    Prostate cancer Father    Heart attack Father    Diabetes Father     ADVANCED DIRECTIVES (Y/N):  N  HEALTH MAINTENANCE: Social History   Tobacco Use   Smoking status: Current Every Day Smoker    Packs/day: 0.50    Years: 30.00    Pack years: 15.00    Types: Cigarettes   Smokeless tobacco: Never Used  Substance Use Topics   Alcohol use: Not Currently   Drug use: Never     Colonoscopy:  PAP:  Bone density:  Lipid panel:  No Known Allergies  Current Outpatient Medications  Medication Sig Dispense Refill   cyclobenzaprine (FLEXERIL) 10 MG tablet Take 1 tablet by mouth 1 day or 1 dose.     Eliquis DVT/PE Starter Pack (  ELIQUIS STARTER PACK) 5 MG TABS Take as directed on package: start with two-'5mg'$  tablets twice daily for 7 days. On day 8, switch to one-'5mg'$  tablet twice daily. 1 each 0   meloxicam (MOBIC) 7.5 MG tablet Take 7.5 mg by mouth daily.     methocarbamol (ROBAXIN) 500 MG tablet Take 1 tablet (500 mg total) by mouth 2 (two) times daily as needed for muscle spasms (for lower back pain). 120 tablet 1   ondansetron (ZOFRAN) 8 MG tablet Take 1 tablet (8 mg total) by mouth 2  (two) times daily as needed. Start on the third day after chemotherapy. 30 tablet 2   No current facility-administered medications for this visit.     OBJECTIVE: Vitals:   06/14/19 0928  BP: 125/81  Pulse: 86  Temp: 97.8 F (36.6 C)     Body mass index is 20.49 kg/m.    ECOG FS:0 - Asymptomatic  General: Well-developed, well-nourished, no acute distress. Eyes: Pink conjunctiva, anicteric sclera. HEENT: Normocephalic, moist mucous membranes. Lungs: Clear to auscultation bilaterally. Heart: Regular rate and rhythm. No rubs, murmurs, or gallops. Abdomen: Soft, nontender, nondistended. No organomegaly noted, normoactive bowel sounds. Musculoskeletal: No edema, cyanosis, or clubbing. Neuro: Alert, answering all questions appropriately. Cranial nerves grossly intact. Skin: No rashes or petechiae noted. Psych: Normal affect.  LAB RESULTS:  Lab Results  Component Value Date   NA 135 06/14/2019   K 4.2 06/14/2019   CL 98 06/14/2019   CO2 25 06/14/2019   GLUCOSE 106 (H) 06/14/2019   BUN 16 06/14/2019   CREATININE 0.86 06/14/2019   CALCIUM 8.8 (L) 06/14/2019   PROT 6.4 (L) 06/05/2019   ALBUMIN 2.9 (L) 06/05/2019   AST 14 (L) 06/05/2019   ALT 9 06/05/2019   ALKPHOS 85 06/05/2019   BILITOT 0.4 06/05/2019   GFRNONAA >60 06/14/2019   GFRAA >60 06/14/2019    Lab Results  Component Value Date   WBC 4.9 06/14/2019   NEUTROABS 3.8 06/14/2019   HGB 8.9 (L) 06/14/2019   HCT 27.6 (L) 06/14/2019   MCV 93.6 06/14/2019   PLT 337 06/14/2019     STUDIES: Dg Chest 1 View  Result Date: 06/05/2019 CLINICAL DATA:  Shortness of breath and leg swelling. EXAM: CHEST  1 VIEW COMPARISON:  Chest CT 05/01/2019 FINDINGS: The cardiac silhouette, mediastinal and hilar contours are within normal limits and stable. The lungs are clear of an acute process. No infiltrates, edema or effusions. No worrisome pulmonary lesions. The bony thorax is intact. IMPRESSION: No acute cardiopulmonary findings and  no worrisome pulmonary lesions. Electronically Signed   By: Marijo Sanes M.D.   On: 06/05/2019 11:39   Ct Angio Chest Pe W And/or Wo Contrast  Result Date: 06/05/2019 CLINICAL DATA:  Shortness of breath. Lower extremity edema. Stage IIIB squamous cell cervical carcinoma EXAM: CT ANGIOGRAPHY CHEST WITH CONTRAST TECHNIQUE: Multidetector CT imaging of the chest was performed using the standard protocol during bolus administration of intravenous contrast. Multiplanar CT image reconstructions and MIPs were obtained to evaluate the vascular anatomy. CONTRAST:  37m OMNIPAQUE IOHEXOL 350 MG/ML SOLN COMPARISON:  PET-CT May 07, 2019 FINDINGS: Cardiovascular: There are several peripheral left lower lobe pulmonary artery emboli, most notably in the anterior, lateral, and posterior segment left lower lobe pulmonary artery branches. No more central pulmonary embolus evident. There is no demonstrable right heart strain. No thoracic aortic aneurysm or dissection. Visualized great vessels appear unremarkable. No pericardial effusion or pericardial thickening evident. Mediastinum/Nodes: Thyroid appears unremarkable. There are several  mildly prominent retrocrural lymph nodes, largest measuring 1.2 x 1.0 cm. No adenopathy elsewhere on this study. No esophageal lesions evident. Lungs/Pleura: There is atelectatic change in each lung base with suspected superimposed pneumonia in the posterior left base. No appreciable pleural effusion. On axial slice 40 series 6, there is a 2 mm nodular opacity in the anterior segment of the right upper lobe. Upper Abdomen: Visualized upper abdominal structures appear unremarkable except for a degree of retrocrural adenopathy seen on recent PET study. Musculoskeletal: There are no blastic or lytic bone lesions. No evident chest wall lesions. Review of the MIP images confirms the above findings. IMPRESSION: 1. Several small peripheral left lower lobe pulmonary emboli. No more central pulmonary  embolus. No right heart strain. 2.  No thoracic aortic aneurysm or dissection. 3. Bibasilar atelectasis with suspected mild pneumonia posterior left base. 2 mm nodular opacity right upper lobe which must be concerning for small potential metastasis given known cervical carcinoma. 3. There is a degree of retrocrural adenopathy, noted on recent PET study. Critical Value/emergent results were called by telephone at the time of interpretation on 06/05/2019 at 4:42 pm to Dr. Nicholes Mango , who verbally acknowledged these results. Electronically Signed   By: Lowella Grip III M.D.   On: 06/05/2019 16:42   US Venous Img Lower Unilateral Left  Result Date: 06/05/2019 CLINICAL DATA:  Left lower extremity edema. History of smoking and malignancy. Evaluate for DVT. EXAM: LEFT LOWER EXTREMITY VENOUS DOPPLER ULTRASOUND TECHNIQUE: Gray-scale sonography with graded compression, as well as color Doppler and duplex ultrasound were performed to evaluate the lower extremity deep venous systems from the level of the common femoral vein and including the common femoral, femoral, profunda femoral, popliteal and calf veins including the posterior tibial, peroneal and gastrocnemius veins when visible. The superficial great saphenous vein was also interrogated. Spectral Doppler was utilized to evaluate flow at rest and with distal augmentation maneuvers in the common femoral, femoral and popliteal veins. COMPARISON:  None. FINDINGS: Contralateral Common Femoral Vein: Respiratory phasicity is normal and symmetric with the symptomatic side. No evidence of thrombus. Normal compressibility. There is mixed echogenic expansile occlusive thrombus extending from the left common femoral vein (image 8) through the saphenofemoral junction and proximal aspect of the greater saphenous vein (image 12) as well as the imaged aspects of the left deep femoral vein (image 15). There is hypoechoic occlusive thrombus involving the proximal (image 19), mid  (image 22) and distal (image 26) aspects of the left femoral vein, extending to involve the proximal (image 30) and distal (image 34) aspects of the popliteal vein as well as both paired left posterior tibial and peroneal veins. Other Findings:  None. IMPRESSION: Examination is positive for extensive acute occlusive DVT extending from the left common femoral vein through the left popliteal veins. Electronically Signed   By: Sandi Mariscal M.D.   On: 06/05/2019 12:20    ASSESSMENT: Stage IIIb cervical cancer.  PLAN:    1.  Stage IIIb cervical cancer: Biopsy results from April 24, 2019 confirmed the diagnosis.  PET scan results from May 07, 2019 reviewed independently with extensive nodal disease as well as mass-effect causing possible hydronephrosis.  Case discussed with gynecology oncology as well as radiation oncology.  Patient is not a surgical candidate as of right now, but will likely benefit from daily XRT along with concurrent weekly cisplatin 40 mg/m2.  Plan to reimage after the conclusion of XRT to determine if brachii therapy is possible.  Will also  considered rebiopsy with interventional radiology to obtain additional tissue for PDL-1 testing.  If there is still residual disease, she benefit from systemic treatment with carboplatinum/Taxol or cisplatin/Taxol plus or minus bevacizumab.  Continue daily XRT.  Proceed with cycle 4 of weekly cisplatin today.  Return to clinic in 1 week for further evaluation and consideration of cycle 5.  2.  Hydronephrosis: Seen on CT scan.  Monitor closely. 3.  Venous access: Patient now has had a port placed. 4.  Renal insufficiency: Patient's creatinine is within normal limits. 5.  Anemia: Hemoglobin improved to 8.9.  Patient received several units of packed red blood cells recently including during her hospital admission. 6.  Abdominal pain: Improved.  Continue hydrocodone as prescribed.  Patient also continues to use meloxicam sparingly. 7.  DVT/PE: Patient  required stent placement and thrombolytics.  She will require Eliquis for minimum 6 months, but she will likely require extended treatment.  Patient expressed understanding and was in agreement with this plan. She also understands that She can call clinic at any time with any questions, concerns, or complaints.   Cancer Staging Cervical cancer ([place]) Staging form: Cervix Uteri, AJCC 8th Edition - Clinical: FIGO Stage IIIB (cT3b, cN1, cM0) - Signed by Lloyd Huger, MD on 05/09/2019   Lloyd Huger, MD   06/15/2019 6:15 AM

## 2019-06-11 ENCOUNTER — Ambulatory Visit
Admission: RE | Admit: 2019-06-11 | Discharge: 2019-06-11 | Disposition: A | Discharge status: Home / Self Care | Payer: BC Managed Care – PPO | Source: Ambulatory Visit | Attending: Radiation Oncology | Admitting: Radiation Oncology

## 2019-06-11 ENCOUNTER — Other Ambulatory Visit: Payer: Self-pay

## 2019-06-11 DIAGNOSIS — D649 Anemia, unspecified: Secondary | ICD-10-CM | POA: Insufficient documentation

## 2019-06-11 DIAGNOSIS — N289 Disorder of kidney and ureter, unspecified: Secondary | ICD-10-CM | POA: Insufficient documentation

## 2019-06-11 DIAGNOSIS — C530 Malignant neoplasm of endocervix: Secondary | ICD-10-CM | POA: Diagnosis not present

## 2019-06-11 DIAGNOSIS — Z510 Encounter for antineoplastic radiation therapy: Secondary | ICD-10-CM | POA: Diagnosis not present

## 2019-06-11 DIAGNOSIS — C772 Secondary and unspecified malignant neoplasm of intra-abdominal lymph nodes: Secondary | ICD-10-CM | POA: Insufficient documentation

## 2019-06-11 DIAGNOSIS — Z5111 Encounter for antineoplastic chemotherapy: Secondary | ICD-10-CM | POA: Insufficient documentation

## 2019-06-11 DIAGNOSIS — C539 Malignant neoplasm of cervix uteri, unspecified: Secondary | ICD-10-CM | POA: Diagnosis not present

## 2019-06-12 ENCOUNTER — Other Ambulatory Visit: Payer: Self-pay

## 2019-06-12 ENCOUNTER — Ambulatory Visit
Admission: RE | Admit: 2019-06-12 | Discharge: 2019-06-12 | Disposition: A | Discharge status: Home / Self Care | Payer: BC Managed Care – PPO | Source: Ambulatory Visit | Attending: Radiation Oncology | Admitting: Radiation Oncology

## 2019-06-12 DIAGNOSIS — N289 Disorder of kidney and ureter, unspecified: Secondary | ICD-10-CM | POA: Diagnosis not present

## 2019-06-12 DIAGNOSIS — C772 Secondary and unspecified malignant neoplasm of intra-abdominal lymph nodes: Secondary | ICD-10-CM | POA: Diagnosis not present

## 2019-06-12 DIAGNOSIS — C530 Malignant neoplasm of endocervix: Secondary | ICD-10-CM | POA: Diagnosis not present

## 2019-06-12 DIAGNOSIS — Z5111 Encounter for antineoplastic chemotherapy: Secondary | ICD-10-CM | POA: Diagnosis not present

## 2019-06-12 DIAGNOSIS — D649 Anemia, unspecified: Secondary | ICD-10-CM | POA: Diagnosis not present

## 2019-06-12 DIAGNOSIS — C539 Malignant neoplasm of cervix uteri, unspecified: Secondary | ICD-10-CM | POA: Diagnosis not present

## 2019-06-12 DIAGNOSIS — Z510 Encounter for antineoplastic radiation therapy: Secondary | ICD-10-CM | POA: Diagnosis not present

## 2019-06-13 ENCOUNTER — Ambulatory Visit
Admission: RE | Admit: 2019-06-13 | Discharge: 2019-06-13 | Disposition: A | Discharge status: Home / Self Care | Payer: BC Managed Care – PPO | Source: Ambulatory Visit | Attending: Radiation Oncology | Admitting: Radiation Oncology

## 2019-06-13 ENCOUNTER — Ambulatory Visit: Payer: BC Managed Care – PPO

## 2019-06-13 ENCOUNTER — Other Ambulatory Visit: Payer: Self-pay

## 2019-06-13 DIAGNOSIS — N289 Disorder of kidney and ureter, unspecified: Secondary | ICD-10-CM | POA: Diagnosis not present

## 2019-06-13 DIAGNOSIS — D649 Anemia, unspecified: Secondary | ICD-10-CM | POA: Diagnosis not present

## 2019-06-13 DIAGNOSIS — Z5111 Encounter for antineoplastic chemotherapy: Secondary | ICD-10-CM | POA: Diagnosis not present

## 2019-06-13 DIAGNOSIS — Z510 Encounter for antineoplastic radiation therapy: Secondary | ICD-10-CM | POA: Diagnosis not present

## 2019-06-13 DIAGNOSIS — C539 Malignant neoplasm of cervix uteri, unspecified: Secondary | ICD-10-CM | POA: Diagnosis not present

## 2019-06-13 DIAGNOSIS — C772 Secondary and unspecified malignant neoplasm of intra-abdominal lymph nodes: Secondary | ICD-10-CM | POA: Diagnosis not present

## 2019-06-13 DIAGNOSIS — C530 Malignant neoplasm of endocervix: Secondary | ICD-10-CM | POA: Diagnosis not present

## 2019-06-14 ENCOUNTER — Inpatient Hospital Stay (HOSPITAL_BASED_OUTPATIENT_CLINIC_OR_DEPARTMENT_OTHER): Payer: BC Managed Care – PPO | Admitting: Oncology

## 2019-06-14 ENCOUNTER — Inpatient Hospital Stay: Payer: BC Managed Care – PPO

## 2019-06-14 ENCOUNTER — Encounter: Payer: Self-pay | Admitting: Oncology

## 2019-06-14 ENCOUNTER — Other Ambulatory Visit: Payer: Self-pay

## 2019-06-14 ENCOUNTER — Ambulatory Visit
Admission: RE | Admit: 2019-06-14 | Discharge: 2019-06-14 | Disposition: A | Discharge status: Home / Self Care | Payer: BC Managed Care – PPO | Source: Ambulatory Visit | Attending: Radiation Oncology | Admitting: Radiation Oncology

## 2019-06-14 ENCOUNTER — Inpatient Hospital Stay: Payer: BC Managed Care – PPO | Attending: Oncology | Admitting: *Deleted

## 2019-06-14 VITALS — BP 125/81 | HR 86 | Temp 97.8°F | Ht 62.0 in | Wt 112.0 lb

## 2019-06-14 DIAGNOSIS — Z79899 Other long term (current) drug therapy: Secondary | ICD-10-CM | POA: Diagnosis not present

## 2019-06-14 DIAGNOSIS — R109 Unspecified abdominal pain: Secondary | ICD-10-CM | POA: Diagnosis not present

## 2019-06-14 DIAGNOSIS — F17210 Nicotine dependence, cigarettes, uncomplicated: Secondary | ICD-10-CM | POA: Diagnosis not present

## 2019-06-14 DIAGNOSIS — Z5111 Encounter for antineoplastic chemotherapy: Secondary | ICD-10-CM | POA: Insufficient documentation

## 2019-06-14 DIAGNOSIS — Z95828 Presence of other vascular implants and grafts: Secondary | ICD-10-CM

## 2019-06-14 DIAGNOSIS — D649 Anemia, unspecified: Secondary | ICD-10-CM | POA: Insufficient documentation

## 2019-06-14 DIAGNOSIS — N1330 Unspecified hydronephrosis: Secondary | ICD-10-CM | POA: Insufficient documentation

## 2019-06-14 DIAGNOSIS — C778 Secondary and unspecified malignant neoplasm of lymph nodes of multiple regions: Secondary | ICD-10-CM | POA: Insufficient documentation

## 2019-06-14 DIAGNOSIS — C539 Malignant neoplasm of cervix uteri, unspecified: Secondary | ICD-10-CM

## 2019-06-14 DIAGNOSIS — Z510 Encounter for antineoplastic radiation therapy: Secondary | ICD-10-CM | POA: Diagnosis not present

## 2019-06-14 DIAGNOSIS — C530 Malignant neoplasm of endocervix: Secondary | ICD-10-CM | POA: Diagnosis not present

## 2019-06-14 DIAGNOSIS — N289 Disorder of kidney and ureter, unspecified: Secondary | ICD-10-CM | POA: Diagnosis not present

## 2019-06-14 DIAGNOSIS — C772 Secondary and unspecified malignant neoplasm of intra-abdominal lymph nodes: Secondary | ICD-10-CM | POA: Diagnosis not present

## 2019-06-14 LAB — BASIC METABOLIC PANEL
Anion gap: 12 (ref 5–15)
BUN: 16 mg/dL (ref 6–20)
CO2: 25 mmol/L (ref 22–32)
Calcium: 8.8 mg/dL — ABNORMAL LOW (ref 8.9–10.3)
Chloride: 98 mmol/L (ref 98–111)
Creatinine, Ser: 0.86 mg/dL (ref 0.44–1.00)
GFR calc Af Amer: >60 mL/min (ref >60)
GFR calc non Af Amer: >60 mL/min (ref >60)
Glucose, Bld: 106 mg/dL — ABNORMAL HIGH (ref 70–99)
Potassium: 4.2 mmol/L (ref 3.5–5.1)
Sodium: 135 mmol/L (ref 135–145)

## 2019-06-14 LAB — CBC WITH DIFFERENTIAL/PLATELET
Abs Immature Granulocytes: 0.03 10*3/uL (ref 0.00–0.07)
Basophils Absolute: 0 10*3/uL (ref 0.0–0.1)
Basophils Relative: 0 %
Eosinophils Absolute: 0.1 10*3/uL (ref 0.0–0.5)
Eosinophils Relative: 3 %
HCT: 27.6 % — ABNORMAL LOW (ref 36.0–46.0)
Hemoglobin: 8.9 g/dL — ABNORMAL LOW (ref 12.0–15.0)
Immature Granulocytes: 1 %
Lymphocytes Relative: 7 %
Lymphs Abs: 0.3 10*3/uL — ABNORMAL LOW (ref 0.7–4.0)
MCH: 30.2 pg (ref 26.0–34.0)
MCHC: 32.2 g/dL (ref 30.0–36.0)
MCV: 93.6 fL (ref 80.0–100.0)
Monocytes Absolute: 0.6 10*3/uL (ref 0.1–1.0)
Monocytes Relative: 12 %
Neutro Abs: 3.8 10*3/uL (ref 1.7–7.7)
Neutrophils Relative %: 77 %
Platelets: 337 10*3/uL (ref 150–400)
RBC: 2.95 MIL/uL — ABNORMAL LOW (ref 3.87–5.11)
RDW: 15.9 % — ABNORMAL HIGH (ref 11.5–15.5)
WBC: 4.9 10*3/uL (ref 4.0–10.5)
nRBC: 0 % (ref 0.0–0.2)

## 2019-06-14 MED ORDER — SODIUM CHLORIDE 0.9 % IV SOLN
Freq: Once | INTRAVENOUS | Status: AC
  Administered 2019-06-14: 11:00:00 via INTRAVENOUS
  Filled 2019-06-14: qty 250

## 2019-06-14 MED ORDER — SODIUM CHLORIDE 0.9% FLUSH
10.0000 mL | Freq: Once | INTRAVENOUS | Status: AC
  Administered 2019-06-14: 10 mL via INTRAVENOUS
  Filled 2019-06-14: qty 10

## 2019-06-14 MED ORDER — PALONOSETRON HCL INJECTION 0.25 MG/5ML
0.2500 mg | Freq: Once | INTRAVENOUS | Status: AC
  Administered 2019-06-14: 13:00:00 0.25 mg via INTRAVENOUS
  Filled 2019-06-14: qty 5

## 2019-06-14 MED ORDER — POTASSIUM CHLORIDE 2 MEQ/ML IV SOLN
Freq: Once | INTRAVENOUS | Status: AC
  Administered 2019-06-14: 11:00:00 via INTRAVENOUS
  Filled 2019-06-14: qty 1000

## 2019-06-14 MED ORDER — SODIUM CHLORIDE 0.9 % IV SOLN
Freq: Once | INTRAVENOUS | Status: AC
  Administered 2019-06-14: 13:00:00 via INTRAVENOUS
  Filled 2019-06-14: qty 5

## 2019-06-14 MED ORDER — SODIUM CHLORIDE 0.9 % IV SOLN
40.0000 mg/m2 | Freq: Once | INTRAVENOUS | Status: AC
  Administered 2019-06-14: 14:00:00 61 mg via INTRAVENOUS
  Filled 2019-06-14: qty 61

## 2019-06-14 MED ORDER — HEPARIN SOD (PORK) LOCK FLUSH 100 UNIT/ML IV SOLN
500.0000 [IU] | Freq: Once | INTRAVENOUS | Status: AC | PRN
  Administered 2019-06-14: 15:00:00 500 [IU]

## 2019-06-14 NOTE — Progress Notes (Signed)
Patient stated that she had been feeling better now then she had been. Patient stated that she is scared that her blood clot form again. Patient is currently taking Eliquis.

## 2019-06-15 ENCOUNTER — Other Ambulatory Visit (INDEPENDENT_AMBULATORY_CARE_PROVIDER_SITE_OTHER): Payer: Self-pay | Admitting: Vascular Surgery

## 2019-06-15 ENCOUNTER — Ambulatory Visit
Admission: RE | Admit: 2019-06-15 | Discharge: 2019-06-15 | Disposition: A | Discharge status: Home / Self Care | Payer: BC Managed Care – PPO | Source: Ambulatory Visit | Attending: Radiation Oncology | Admitting: Radiation Oncology

## 2019-06-15 ENCOUNTER — Other Ambulatory Visit: Payer: Self-pay

## 2019-06-15 DIAGNOSIS — Z510 Encounter for antineoplastic radiation therapy: Secondary | ICD-10-CM | POA: Diagnosis not present

## 2019-06-15 DIAGNOSIS — Z5111 Encounter for antineoplastic chemotherapy: Secondary | ICD-10-CM | POA: Diagnosis not present

## 2019-06-15 DIAGNOSIS — I82402 Acute embolism and thrombosis of unspecified deep veins of left lower extremity: Secondary | ICD-10-CM

## 2019-06-15 DIAGNOSIS — I871 Compression of vein: Secondary | ICD-10-CM

## 2019-06-15 DIAGNOSIS — C539 Malignant neoplasm of cervix uteri, unspecified: Secondary | ICD-10-CM | POA: Diagnosis not present

## 2019-06-15 DIAGNOSIS — C530 Malignant neoplasm of endocervix: Secondary | ICD-10-CM | POA: Diagnosis not present

## 2019-06-15 DIAGNOSIS — N289 Disorder of kidney and ureter, unspecified: Secondary | ICD-10-CM | POA: Diagnosis not present

## 2019-06-15 DIAGNOSIS — D649 Anemia, unspecified: Secondary | ICD-10-CM | POA: Diagnosis not present

## 2019-06-15 DIAGNOSIS — Z95820 Peripheral vascular angioplasty status with implants and grafts: Secondary | ICD-10-CM

## 2019-06-15 DIAGNOSIS — C772 Secondary and unspecified malignant neoplasm of intra-abdominal lymph nodes: Secondary | ICD-10-CM | POA: Diagnosis not present

## 2019-06-16 NOTE — Progress Notes (Signed)
[place]  [phone] [phone]  ID: Mariah Mcmahon OB: [birth year]  MR#: [id number]  BWG#:[id number]  Patient Care Team: Venita Lick, NP as PCP - General (Nurse Practitioner) Clent Jacks, RN as Oncology Nurse Navigator  CHIEF COMPLAINT: Stage IIIb cervical cancer  INTERVAL HISTORY: Patient returns to clinic today for further evaluation and consideration of cycle 5 of weekly cisplatin.  She continues to be anxious, but otherwise feels well.  She states her abnormal vaginal bleeding has resolved.  She does not complain of abdominal pain or bloating today. She has no neurologic complaints.  She denies any recent fevers or illnesses.  She has a good appetite and denies weight loss.  She has no chest pain, shortness of breath, cough, or hemoptysis.  She denies any nausea, vomiting, constipation, or diarrhea.  She has no urinary complaints.  Patient offers no further specific complaints today.  REVIEW OF SYSTEMS:   Review of Systems  Constitutional: Negative.  Negative for fever, malaise/fatigue and weight loss.  Respiratory: Negative.  Negative for cough and shortness of breath.   Cardiovascular: Negative.  Negative for chest pain and leg swelling.  Gastrointestinal: Negative for abdominal pain.  Genitourinary: Negative.  Negative for hematuria.  Musculoskeletal: Negative.  Negative for back pain.  Skin: Negative.  Negative for rash.  Neurological: Negative.  Negative for dizziness, focal weakness, weakness and headaches.  Psychiatric/Behavioral: The patient is nervous/anxious.     As per HPI. Otherwise, a complete review of systems is negative.  PAST MEDICAL HISTORY: Past Medical History:  Diagnosis Date   Cancer ([place])    cwervical cancer   No pertinent past medical history     PAST SURGICAL HISTORY: Past Surgical History:  Procedure Laterality Date   MASS EXCISION     Throat   PERIPHERAL VASCULAR THROMBECTOMY Left  06/05/2019   Procedure: PERIPHERAL VASCULAR THROMBECTOMY WITH IVC FILTER (LEFT LOWER EXTREMITY);  Surgeon: Katha Cabal, MD;  Location: [facility] [facility];  Service: Cardiovascular;  Laterality: Left;   PORTA CATH INSERTION N/A 06/06/2019   Procedure: PORTA CATH INSERTION;  Surgeon: Katha Cabal, MD;  Location: [facility];  Service: Cardiovascular;  Laterality: N/A;   TONSILLECTOMY      FAMILY HISTORY: Family History  Problem Relation Age of Onset   Brain cancer Mother 55   Alcohol abuse Father    Heart disease Father    Prostate cancer Father    Heart attack Father    Diabetes Father     ADVANCED DIRECTIVES (Y/N):  N  HEALTH MAINTENANCE: Social History   Tobacco Use   Smoking status: Current Every Day Smoker    Packs/day: 0.50    Years: 30.00    Pack years: 15.00    Types: Cigarettes   Smokeless tobacco: Never Used  Substance Use Topics   Alcohol use: Not Currently   Drug use: Never     Colonoscopy:  PAP:  Bone density:  Lipid panel:  No Known Allergies  Current Outpatient Medications  Medication Sig Dispense Refill   acetaminophen (TYLENOL) 500 MG tablet Take 500 mg by mouth every 6 (six) hours as needed.     cyclobenzaprine (FLEXERIL) 10 MG tablet Take 1 tablet by mouth 1 day or 1 dose.     Eliquis DVT/PE Starter Pack (ELIQUIS STARTER PACK) 5 MG TABS Take as directed on package: start with two-32m tablets twice daily for 7 days. On day 8, switch to one-532mtablet twice daily. 1  each 0   methocarbamol (ROBAXIN) 500 MG tablet Take 1 tablet (500 mg total) by mouth 2 (two) times daily as needed for muscle spasms (for lower back pain). 120 tablet 1   ondansetron (ZOFRAN) 8 MG tablet Take 1 tablet (8 mg total) by mouth 2 (two) times daily as needed. Start on the third day after chemotherapy. 30 tablet 2   No current facility-administered medications for this visit.     OBJECTIVE: Vitals:   06/21/19 0948  BP: 113/80  Pulse:  (!) 101  Resp: 18  Temp: 97.9 F (36.6 C)  SpO2: 100%     Body mass index is 20.19 kg/m.    ECOG FS:0 - Asymptomatic  General: Well-developed, well-nourished, no acute distress. Eyes: Pink conjunctiva, anicteric sclera. HEENT: Normocephalic, moist mucous membranes. Lungs: Clear to auscultation bilaterally. Heart: Regular rate and rhythm. No rubs, murmurs, or gallops. Abdomen: Soft, nontender, nondistended. No organomegaly noted, normoactive bowel sounds. Musculoskeletal: No edema, cyanosis, or clubbing. Neuro: Alert, answering all questions appropriately. Cranial nerves grossly intact. Skin: No rashes or petechiae noted. Psych: Normal affect.  LAB RESULTS:  Lab Results  Component Value Date   NA 136 06/21/2019   K 4.0 06/21/2019   CL 99 06/21/2019   CO2 26 06/21/2019   GLUCOSE 123 (H) 06/21/2019   BUN 18 06/21/2019   CREATININE 0.91 06/21/2019   CALCIUM 9.2 06/21/2019   PROT 6.4 (L) 06/05/2019   ALBUMIN 2.9 (L) 06/05/2019   AST 14 (L) 06/05/2019   ALT 9 06/05/2019   ALKPHOS 85 06/05/2019   BILITOT 0.4 06/05/2019   GFRNONAA >60 06/21/2019   GFRAA >60 06/21/2019    Lab Results  Component Value Date   WBC 4.9 06/21/2019   NEUTROABS 4.0 06/21/2019   HGB 9.3 (L) 06/21/2019   HCT 29.0 (L) 06/21/2019   MCV 93.9 06/21/2019   PLT 353 06/21/2019     STUDIES: Dg Chest 1 View  Result Date: 06/05/2019 CLINICAL DATA:  Shortness of breath and leg swelling. EXAM: CHEST  1 VIEW COMPARISON:  Chest CT 05/01/2019 FINDINGS: The cardiac silhouette, mediastinal and hilar contours are within normal limits and stable. The lungs are clear of an acute process. No infiltrates, edema or effusions. No worrisome pulmonary lesions. The bony thorax is intact. IMPRESSION: No acute cardiopulmonary findings and no worrisome pulmonary lesions. Electronically Signed   By: Marijo Sanes M.D.   On: 06/05/2019 11:39   Ct Angio Chest Pe W And/or Wo Contrast  Result Date: 06/05/2019 CLINICAL DATA:   Shortness of breath. Lower extremity edema. Stage IIIB squamous cell cervical carcinoma EXAM: CT ANGIOGRAPHY CHEST WITH CONTRAST TECHNIQUE: Multidetector CT imaging of the chest was performed using the standard protocol during bolus administration of intravenous contrast. Multiplanar CT image reconstructions and MIPs were obtained to evaluate the vascular anatomy. CONTRAST:  93m OMNIPAQUE IOHEXOL 350 MG/ML SOLN COMPARISON:  PET-CT May 07, 2019 FINDINGS: Cardiovascular: There are several peripheral left lower lobe pulmonary artery emboli, most notably in the anterior, lateral, and posterior segment left lower lobe pulmonary artery branches. No more central pulmonary embolus evident. There is no demonstrable right heart strain. No thoracic aortic aneurysm or dissection. Visualized great vessels appear unremarkable. No pericardial effusion or pericardial thickening evident. Mediastinum/Nodes: Thyroid appears unremarkable. There are several mildly prominent retrocrural lymph nodes, largest measuring 1.2 x 1.0 cm. No adenopathy elsewhere on this study. No esophageal lesions evident. Lungs/Pleura: There is atelectatic change in each lung base with suspected superimposed pneumonia in the posterior left base.  No appreciable pleural effusion. On axial slice 40 series 6, there is a 2 mm nodular opacity in the anterior segment of the right upper lobe. Upper Abdomen: Visualized upper abdominal structures appear unremarkable except for a degree of retrocrural adenopathy seen on recent PET study. Musculoskeletal: There are no blastic or lytic bone lesions. No evident chest wall lesions. Review of the MIP images confirms the above findings. IMPRESSION: 1. Several small peripheral left lower lobe pulmonary emboli. No more central pulmonary embolus. No right heart strain. 2.  No thoracic aortic aneurysm or dissection. 3. Bibasilar atelectasis with suspected mild pneumonia posterior left base. 2 mm nodular opacity right upper lobe  which must be concerning for small potential metastasis given known cervical carcinoma. 3. There is a degree of retrocrural adenopathy, noted on recent PET study. Critical Value/emergent results were called by telephone at the time of interpretation on 06/05/2019 at 4:42 pm to Dr. Nicholes Mango , who verbally acknowledged these results. Electronically Signed   By: Lowella Grip III M.D.   On: 06/05/2019 16:42   US Venous Img Lower Unilateral Left  Result Date: 06/05/2019 CLINICAL DATA:  Left lower extremity edema. History of smoking and malignancy. Evaluate for DVT. EXAM: LEFT LOWER EXTREMITY VENOUS DOPPLER ULTRASOUND TECHNIQUE: Gray-scale sonography with graded compression, as well as color Doppler and duplex ultrasound were performed to evaluate the lower extremity deep venous systems from the level of the common femoral vein and including the common femoral, femoral, profunda femoral, popliteal and calf veins including the posterior tibial, peroneal and gastrocnemius veins when visible. The superficial great saphenous vein was also interrogated. Spectral Doppler was utilized to evaluate flow at rest and with distal augmentation maneuvers in the common femoral, femoral and popliteal veins. COMPARISON:  None. FINDINGS: Contralateral Common Femoral Vein: Respiratory phasicity is normal and symmetric with the symptomatic side. No evidence of thrombus. Normal compressibility. There is mixed echogenic expansile occlusive thrombus extending from the left common femoral vein (image 8) through the saphenofemoral junction and proximal aspect of the greater saphenous vein (image 12) as well as the imaged aspects of the left deep femoral vein (image 15). There is hypoechoic occlusive thrombus involving the proximal (image 19), mid (image 22) and distal (image 26) aspects of the left femoral vein, extending to involve the proximal (image 30) and distal (image 34) aspects of the popliteal vein as well as both paired left  posterior tibial and peroneal veins. Other Findings:  None. IMPRESSION: Examination is positive for extensive acute occlusive DVT extending from the left common femoral vein through the left popliteal veins. Electronically Signed   By: Sandi Mariscal M.D.   On: 06/05/2019 12:20   Vas [place] Lower Extremity Venous (dvt)  Result Date: 06/21/2019  Lower Venous Study Risk Factors: DVT DVT post procedure x 2 weeks. Comparison Study: outside study 06/05/2019 Performing Technologist: Concha Norway RVT  Examination Guidelines: A complete evaluation includes B-mode imaging, spectral Doppler, color Doppler, and power Doppler as needed of all accessible portions of each vessel. Bilateral testing is considered an integral part of a complete examination. Limited examinations for reoccurring indications may be performed as noted.  +-----+---------------+---------+-----------+----------+-------+  RIGHT Compressibility Phasicity Spontaneity Properties Summary  +-----+---------------+---------+-----------+----------+-------+  CFV   Full            Yes       Yes                             +-----+---------------+---------+-----------+----------+-------+  SFJ   Full                                                      +-----+---------------+---------+-----------+----------+-------+   +----+---------------+---------+-----------+----------+-------+  LEFT Compressibility Phasicity Spontaneity Properties Summary  +----+---------------+---------+-----------+----------+-------+       Full            Yes       Yes                             +----+---------------+---------+-----------+----------+-------+     Summary: Right: No evidence of common femoral vein obstruction. Left: There is no evidence of deep vein thrombosis in the lower extremity.There is no evidence of superficial venous thrombosis. No residual thrombus seen from CIV throughout the left lower extremity. Left CIV stent seen and is patent.  *See table(s) above for measurements and  observations. Electronically signed by Hortencia Pilar MD on 06/21/2019 at 5:02:10 PM.    Final     ASSESSMENT: Stage IIIb cervical cancer.  PLAN:    1.  Stage IIIb cervical cancer: Biopsy results from April 24, 2019 confirmed the diagnosis.  PET scan results from May 07, 2019 reviewed independently with extensive nodal disease as well as mass-effect causing possible hydronephrosis.  Case discussed with gynecology oncology as well as radiation oncology.  Patient is not a surgical candidate as of right now, but will likely benefit from daily XRT along with concurrent weekly cisplatin 40 mg/m2.  Plan to reimage after the conclusion of XRT to determine if brachii therapy is possible.  Will also considered rebiopsy with interventional radiology to obtain additional tissue for PDL-1 testing.  If there is still residual disease, she benefit from systemic treatment with carboplatinum/Taxol or cisplatin/Taxol plus or minus bevacizumab.  Continue daily XRT completed on July 03, 2019.  Proceed with cycle 5 of weekly cisplatin today.  Return to clinic in 1 week for further evaluation and consideration of cycle 6.   2.  Hydronephrosis: Seen on CT scan.  Monitor closely. 3.  Venous access: Patient now has had a port placed. 4.  Renal insufficiency: Resolved. 5.  Anemia: Hemoglobin slightly improved to 9.3.  Patient received several units of packed red blood cells recently including during her hospital admission. 6.  Abdominal pain: Improved.  Continue hydrocodone as prescribed.  Patient also continues to use meloxicam sparingly. 7.  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  She will require Eliquis for minimum 6 months, but she will likely require extended treatment.   Patient expressed understanding and was in agreement with this plan. She also understands that She can call clinic at any time with any questions, concerns, or complaints.   Cancer Staging Cervical cancer [facility]) Staging form:  Cervix Uteri, AJCC 8th Edition - Clinical: FIGO Stage IIIB (cT3b, cN1, cM0) - Signed by Lloyd Huger, MD on 05/09/2019   Lloyd Huger, MD   06/21/2019 6:55 PM

## 2019-06-18 ENCOUNTER — Ambulatory Visit (INDEPENDENT_AMBULATORY_CARE_PROVIDER_SITE_OTHER): Payer: BC Managed Care – PPO | Admitting: Vascular Surgery

## 2019-06-18 ENCOUNTER — Encounter (INDEPENDENT_AMBULATORY_CARE_PROVIDER_SITE_OTHER): Payer: Self-pay | Admitting: Vascular Surgery

## 2019-06-18 ENCOUNTER — Other Ambulatory Visit: Payer: Self-pay

## 2019-06-18 ENCOUNTER — Ambulatory Visit (INDEPENDENT_AMBULATORY_CARE_PROVIDER_SITE_OTHER): Payer: BC Managed Care – PPO

## 2019-06-18 ENCOUNTER — Ambulatory Visit
Admission: RE | Admit: 2019-06-18 | Discharge: 2019-06-18 | Disposition: A | Discharge status: Home / Self Care | Payer: BC Managed Care – PPO | Source: Ambulatory Visit | Attending: Radiation Oncology | Admitting: Radiation Oncology

## 2019-06-18 VITALS — BP 110/74 | HR 118 | Resp 12 | Ht 62.0 in | Wt 111.0 lb

## 2019-06-18 DIAGNOSIS — I82402 Acute embolism and thrombosis of unspecified deep veins of left lower extremity: Secondary | ICD-10-CM | POA: Diagnosis not present

## 2019-06-18 DIAGNOSIS — C772 Secondary and unspecified malignant neoplasm of intra-abdominal lymph nodes: Secondary | ICD-10-CM | POA: Diagnosis not present

## 2019-06-18 DIAGNOSIS — N289 Disorder of kidney and ureter, unspecified: Secondary | ICD-10-CM | POA: Diagnosis not present

## 2019-06-18 DIAGNOSIS — Z95820 Peripheral vascular angioplasty status with implants and grafts: Secondary | ICD-10-CM

## 2019-06-18 DIAGNOSIS — I871 Compression of vein: Secondary | ICD-10-CM

## 2019-06-18 DIAGNOSIS — C539 Malignant neoplasm of cervix uteri, unspecified: Secondary | ICD-10-CM | POA: Diagnosis not present

## 2019-06-18 DIAGNOSIS — Z5111 Encounter for antineoplastic chemotherapy: Secondary | ICD-10-CM | POA: Diagnosis not present

## 2019-06-18 DIAGNOSIS — Z510 Encounter for antineoplastic radiation therapy: Secondary | ICD-10-CM | POA: Diagnosis not present

## 2019-06-18 DIAGNOSIS — D649 Anemia, unspecified: Secondary | ICD-10-CM | POA: Diagnosis not present

## 2019-06-18 DIAGNOSIS — C530 Malignant neoplasm of endocervix: Secondary | ICD-10-CM | POA: Diagnosis not present

## 2019-06-18 DIAGNOSIS — I82422 Acute embolism and thrombosis of left iliac vein: Secondary | ICD-10-CM

## 2019-06-18 NOTE — Progress Notes (Signed)
MRN : [id number]  Mariah Mcmahon is a 53 y.o. (01/25/1966) female who presents with chief complaint of No chief complaint on file. Marland Kitchen  History of Present Illness:   The patient presents to the office for evaluation of DVT.  DVT was identified at [facility] by Duplex ultrasound.  The initial symptoms were pain and swelling in the lower extremity.  Patient presented to [facility] June 05, 2019 with profound swelling and pain of the left lower extremity.  She did undergo mechanical thrombectomy of the femoral veins as well as the iliac veins and a 14 x 60 via Novo stent was placed in the common iliac vein on the left.  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  The patient notes the leg not painful at all and has minimal swelling with dependency.  Symptoms are much better with elevation.  The patient notes minimal edema in the morning which steadily worsens throughout the day.    The patient has not been using compression therapy at this point.  No SOB or pleuritic chest pains.  No cough or hemoptysis.  No blood per rectum or blood in any sputum.  No excessive bruising per the patient.   No outpatient medications have been marked as taking for the 06/18/19 encounter (Appointment) with Delana Meyer, Dolores Lory, MD.    Past Medical History:  Diagnosis Date   Cancer [facility])    cwervical cancer   No pertinent past medical history     Past Surgical History:  Procedure Laterality Date   MASS EXCISION     Throat   PERIPHERAL VASCULAR THROMBECTOMY Left 06/05/2019   Procedure: PERIPHERAL VASCULAR THROMBECTOMY WITH IVC FILTER (LEFT LOWER EXTREMITY);  Surgeon: Katha Cabal, MD;  Location: [place] [facility];  Service: Cardiovascular;  Laterality: Left;   PORTA CATH INSERTION N/A 06/06/2019   Procedure: PORTA CATH INSERTION;  Surgeon: Katha Cabal, MD;  Location: [place] [facility];  Service: Cardiovascular;  Laterality: N/A;     TONSILLECTOMY      Social History Social History   Tobacco Use   Smoking status: Current Every Day Smoker    Packs/day: 0.50    Years: 30.00    Pack years: 15.00    Types: Cigarettes   Smokeless tobacco: Never Used  Substance Use Topics   Alcohol use: Not Currently   Drug use: Never    Family History Family History  Problem Relation Age of Onset   Brain cancer Mother 71   Alcohol abuse Father    Heart disease Father    Prostate cancer Father    Heart attack Father    Diabetes Father     No Known Allergies   REVIEW OF SYSTEMS (Negative unless checked)  Constitutional: [] Weight loss  [] Fever  [] Chills Cardiac: [] Chest pain   [] Chest pressure   [] Palpitations   [] Shortness of breath when laying flat   [] Shortness of breath with exertion. Vascular:  [] Pain in legs with walking   [] Pain in legs at rest  [x] History of DVT   [] Phlebitis   [] Swelling in legs   [] Varicose veins   [] Non-healing ulcers Pulmonary:   [] Uses home oxygen   [] Productive cough   [] Hemoptysis   [] Wheeze  [] COPD   [] Asthma Neurologic:  [] Dizziness   [] Seizures   [] History of stroke   [] History of TIA  [] Aphasia   [] Vissual changes   [] Weakness or numbness in arm   [] Weakness or  numbness in leg Musculoskeletal:   [] Joint swelling   [] Joint pain   [] Low back pain Hematologic:  [] Easy bruising  [] Easy bleeding   [] Hypercoagulable state   [] Anemic Gastrointestinal:  [] Diarrhea   [] Vomiting  [] Gastroesophageal reflux/heartburn   [] Difficulty swallowing. Genitourinary:  [] Chronic kidney disease   [] Difficult urination  [] Frequent urination   [] Blood in urine Skin:  [] Rashes   [] Ulcers  Psychological:  [] History of anxiety   []  History of major depression.  Physical Examination  There were no vitals filed for this visit. There is no height or weight on file to calculate BMI. Gen: WD/WN, NAD Head: [place]/AT, No temporalis wasting.  Ear/Nose/Throat: Hearing grossly intact, nares w/o erythema or  drainage Eyes: PER, EOMI, sclera nonicteric.  Neck: Supple, no large masses.   Pulmonary:  Good air movement, no audible wheezing bilaterally, no use of accessory muscles.  Cardiac: RRR, no JVD Vascular: no swelling of the left leg Gastrointestinal: Non-distended. No guarding/no peritoneal signs.  Musculoskeletal: M/S 5/5 throughout.  No deformity or atrophy.  Neurologic: CN 2-12 intact. Symmetrical.  Speech is fluent. Motor exam as listed above. Psychiatric: Judgment intact, Mood & affect appropriate for pt's clinical situation. Dermatologic: No rashes or ulcers noted.  No changes consistent with cellulitis. Lymph : No lichenification or skin changes of chronic lymphedema.  CBC Lab Results  Component Value Date   WBC 4.9 06/14/2019   HGB 8.9 (L) 06/14/2019   HCT 27.6 (L) 06/14/2019   MCV 93.6 06/14/2019   PLT 337 06/14/2019    BMET    Component Value Date/Time   NA 135 06/14/2019 0916   NA 138 04/09/2019 0918   K 4.2 06/14/2019 0916   CL 98 06/14/2019 0916   CO2 25 06/14/2019 0916   GLUCOSE 106 (H) 06/14/2019 0916   BUN 16 06/14/2019 0916   BUN 12 04/09/2019 0918   CREATININE 0.86 06/14/2019 0916   CALCIUM 8.8 (L) 06/14/2019 0916   GFRNONAA >60 06/14/2019 0916   GFRAA >60 06/14/2019 0916   Estimated Creatinine Clearance: 61.9 mL/min (by C-G formula based on SCr of 0.86 mg/dL).  COAG Lab Results  Component Value Date   INR 1.1 06/05/2019    Radiology Dg Chest 1 View  Result Date: 06/05/2019 CLINICAL DATA:  Shortness of breath and leg swelling. EXAM: CHEST  1 VIEW COMPARISON:  Chest CT 05/01/2019 FINDINGS: The cardiac silhouette, mediastinal and hilar contours are within normal limits and stable. The lungs are clear of an acute process. No infiltrates, edema or effusions. No worrisome pulmonary lesions. The bony thorax is intact. IMPRESSION: No acute cardiopulmonary findings and no worrisome pulmonary lesions. Electronically Signed   By: Marijo Sanes M.D.   On:  06/05/2019 11:39   Ct Angio Chest Pe W And/or Wo Contrast  Result Date: 06/05/2019 CLINICAL DATA:  Shortness of breath. Lower extremity edema. Stage IIIB squamous cell cervical carcinoma EXAM: CT ANGIOGRAPHY CHEST WITH CONTRAST TECHNIQUE: Multidetector CT imaging of the chest was performed using the standard protocol during bolus administration of intravenous contrast. Multiplanar CT image reconstructions and MIPs were obtained to evaluate the vascular anatomy. CONTRAST:  35mL OMNIPAQUE IOHEXOL 350 MG/ML SOLN COMPARISON:  PET-CT May 07, 2019 FINDINGS: Cardiovascular: There are several peripheral left lower lobe pulmonary artery emboli, most notably in the anterior, lateral, and posterior segment left lower lobe pulmonary artery branches. No more central pulmonary embolus evident. There is no demonstrable right heart strain. No thoracic aortic aneurysm or dissection. Visualized great vessels appear unremarkable. No pericardial effusion  or pericardial thickening evident. Mediastinum/Nodes: Thyroid appears unremarkable. There are several mildly prominent retrocrural lymph nodes, largest measuring 1.2 x 1.0 cm. No adenopathy elsewhere on this study. No esophageal lesions evident. Lungs/Pleura: There is atelectatic change in each lung base with suspected superimposed pneumonia in the posterior left base. No appreciable pleural effusion. On axial slice 40 series 6, there is a 2 mm nodular opacity in the anterior segment of the right upper lobe. Upper Abdomen: Visualized upper abdominal structures appear unremarkable except for a degree of retrocrural adenopathy seen on recent PET study. Musculoskeletal: There are no blastic or lytic bone lesions. No evident chest wall lesions. Review of the MIP images confirms the above findings. IMPRESSION: 1. Several small peripheral left lower lobe pulmonary emboli. No more central pulmonary embolus. No right heart strain. 2.  No thoracic aortic aneurysm or dissection. 3.  Bibasilar atelectasis with suspected mild pneumonia posterior left base. 2 mm nodular opacity right upper lobe which must be concerning for small potential metastasis given known cervical carcinoma. 3. There is a degree of retrocrural adenopathy, noted on recent PET study. Critical Value/emergent results were called by telephone at the time of interpretation on 06/05/2019 at 4:42 pm to Dr. Nicholes Mango , who verbally acknowledged these results. Electronically Signed   By: Lowella Grip III M.D.   On: 06/05/2019 16:42   US Venous Img Lower Unilateral Left  Result Date: 06/05/2019 CLINICAL DATA:  Left lower extremity edema. History of smoking and malignancy. Evaluate for DVT. EXAM: LEFT LOWER EXTREMITY VENOUS DOPPLER ULTRASOUND TECHNIQUE: Gray-scale sonography with graded compression, as well as color Doppler and duplex ultrasound were performed to evaluate the lower extremity deep venous systems from the level of the common femoral vein and including the common femoral, femoral, profunda femoral, popliteal and calf veins including the posterior tibial, peroneal and gastrocnemius veins when visible. The superficial great saphenous vein was also interrogated. Spectral Doppler was utilized to evaluate flow at rest and with distal augmentation maneuvers in the common femoral, femoral and popliteal veins. COMPARISON:  None. FINDINGS: Contralateral Common Femoral Vein: Respiratory phasicity is normal and symmetric with the symptomatic side. No evidence of thrombus. Normal compressibility. There is mixed echogenic expansile occlusive thrombus extending from the left common femoral vein (image 8) through the saphenofemoral junction and proximal aspect of the greater saphenous vein (image 12) as well as the imaged aspects of the left deep femoral vein (image 15). There is hypoechoic occlusive thrombus involving the proximal (image 19), mid (image 22) and distal (image 26) aspects of the left femoral vein, extending to  involve the proximal (image 30) and distal (image 34) aspects of the popliteal vein as well as both paired left posterior tibial and peroneal veins. Other Findings:  None. IMPRESSION: Examination is positive for extensive acute occlusive DVT extending from the left common femoral vein through the left popliteal veins. Electronically Signed   By: Sandi Mariscal M.D.   On: 06/05/2019 12:20      Assessment/Plan 1. Acute deep vein thrombosis (DVT) of iliac vein of left lower extremity ([place]) Patient has done very well status post thrombectomy intervention.  Given that there is a mechanical indication or cause for her clot I would be inclined to stop her anti-coagulation after she is been treated for her pulmonary embolism.  I would defer the exact length of time to Dr. Grayland Ormond.  I will plan to remove her IVC filter at the end of September.  Risks and benefits were reviewed all questions  answered patient agrees to proceed.  2. Malignant neoplasm of cervix, unspecified site [facility]) They have now used her port for chemotherapy it went very well.  Continue using the Infuse-a-Port.   Hortencia Pilar, MD  06/18/2019 1:32 PM

## 2019-06-19 ENCOUNTER — Ambulatory Visit
Admission: RE | Admit: 2019-06-19 | Discharge: 2019-06-19 | Disposition: A | Discharge status: Home / Self Care | Payer: BC Managed Care – PPO | Source: Ambulatory Visit | Attending: Radiation Oncology | Admitting: Radiation Oncology

## 2019-06-19 ENCOUNTER — Other Ambulatory Visit: Payer: Self-pay

## 2019-06-19 ENCOUNTER — Telehealth (INDEPENDENT_AMBULATORY_CARE_PROVIDER_SITE_OTHER): Payer: Self-pay

## 2019-06-19 DIAGNOSIS — Z5111 Encounter for antineoplastic chemotherapy: Secondary | ICD-10-CM | POA: Diagnosis not present

## 2019-06-19 DIAGNOSIS — C772 Secondary and unspecified malignant neoplasm of intra-abdominal lymph nodes: Secondary | ICD-10-CM | POA: Diagnosis not present

## 2019-06-19 DIAGNOSIS — C530 Malignant neoplasm of endocervix: Secondary | ICD-10-CM | POA: Diagnosis not present

## 2019-06-19 DIAGNOSIS — N289 Disorder of kidney and ureter, unspecified: Secondary | ICD-10-CM | POA: Diagnosis not present

## 2019-06-19 DIAGNOSIS — D649 Anemia, unspecified: Secondary | ICD-10-CM | POA: Diagnosis not present

## 2019-06-19 DIAGNOSIS — Z510 Encounter for antineoplastic radiation therapy: Secondary | ICD-10-CM | POA: Diagnosis not present

## 2019-06-19 DIAGNOSIS — C539 Malignant neoplasm of cervix uteri, unspecified: Secondary | ICD-10-CM | POA: Diagnosis not present

## 2019-06-19 NOTE — Telephone Encounter (Signed)
Patient was seen on 06/18/2019 and wanted to be scheduled for IVC removal the end of September. The patient has been scheduled for 07/07/2019 with Dr. Delana Meyer with a 6:45 am arrival time to the [facility]. Covid testing is on 07/03/2019 between 12:30-2:30 pm at the [facility]. Pre-procedure instructions will be mailed to the patient.

## 2019-06-20 ENCOUNTER — Ambulatory Visit
Admission: RE | Admit: 2019-06-20 | Discharge: 2019-06-20 | Disposition: A | Discharge status: Home / Self Care | Payer: BC Managed Care – PPO | Source: Ambulatory Visit | Attending: Radiation Oncology | Admitting: Radiation Oncology

## 2019-06-20 ENCOUNTER — Other Ambulatory Visit: Payer: Self-pay

## 2019-06-20 DIAGNOSIS — C772 Secondary and unspecified malignant neoplasm of intra-abdominal lymph nodes: Secondary | ICD-10-CM | POA: Diagnosis not present

## 2019-06-20 DIAGNOSIS — C539 Malignant neoplasm of cervix uteri, unspecified: Secondary | ICD-10-CM | POA: Diagnosis not present

## 2019-06-20 DIAGNOSIS — Z510 Encounter for antineoplastic radiation therapy: Secondary | ICD-10-CM | POA: Diagnosis not present

## 2019-06-20 DIAGNOSIS — N289 Disorder of kidney and ureter, unspecified: Secondary | ICD-10-CM | POA: Diagnosis not present

## 2019-06-20 DIAGNOSIS — C530 Malignant neoplasm of endocervix: Secondary | ICD-10-CM | POA: Diagnosis not present

## 2019-06-20 DIAGNOSIS — D649 Anemia, unspecified: Secondary | ICD-10-CM | POA: Diagnosis not present

## 2019-06-20 DIAGNOSIS — Z5111 Encounter for antineoplastic chemotherapy: Secondary | ICD-10-CM | POA: Diagnosis not present

## 2019-06-21 ENCOUNTER — Inpatient Hospital Stay (HOSPITAL_BASED_OUTPATIENT_CLINIC_OR_DEPARTMENT_OTHER): Payer: BC Managed Care – PPO | Admitting: Oncology

## 2019-06-21 ENCOUNTER — Ambulatory Visit: Payer: BC Managed Care – PPO

## 2019-06-21 ENCOUNTER — Other Ambulatory Visit: Payer: Self-pay

## 2019-06-21 ENCOUNTER — Inpatient Hospital Stay: Payer: BC Managed Care – PPO

## 2019-06-21 ENCOUNTER — Inpatient Hospital Stay: Payer: BC Managed Care – PPO | Admitting: Hospice and Palliative Medicine

## 2019-06-21 ENCOUNTER — Ambulatory Visit
Admission: RE | Admit: 2019-06-21 | Discharge: 2019-06-21 | Disposition: A | Discharge status: Home / Self Care | Payer: BC Managed Care – PPO | Source: Ambulatory Visit | Attending: Radiation Oncology | Admitting: Radiation Oncology

## 2019-06-21 VITALS — BP 113/80 | HR 101 | Temp 97.9°F | Resp 18 | Wt 110.4 lb

## 2019-06-21 DIAGNOSIS — Z510 Encounter for antineoplastic radiation therapy: Secondary | ICD-10-CM | POA: Diagnosis not present

## 2019-06-21 DIAGNOSIS — R109 Unspecified abdominal pain: Secondary | ICD-10-CM | POA: Diagnosis not present

## 2019-06-21 DIAGNOSIS — C539 Malignant neoplasm of cervix uteri, unspecified: Secondary | ICD-10-CM

## 2019-06-21 DIAGNOSIS — C530 Malignant neoplasm of endocervix: Secondary | ICD-10-CM | POA: Diagnosis not present

## 2019-06-21 DIAGNOSIS — Z5111 Encounter for antineoplastic chemotherapy: Secondary | ICD-10-CM | POA: Diagnosis not present

## 2019-06-21 DIAGNOSIS — C772 Secondary and unspecified malignant neoplasm of intra-abdominal lymph nodes: Secondary | ICD-10-CM | POA: Diagnosis not present

## 2019-06-21 DIAGNOSIS — F17210 Nicotine dependence, cigarettes, uncomplicated: Secondary | ICD-10-CM | POA: Diagnosis not present

## 2019-06-21 DIAGNOSIS — Z79899 Other long term (current) drug therapy: Secondary | ICD-10-CM | POA: Diagnosis not present

## 2019-06-21 DIAGNOSIS — N1330 Unspecified hydronephrosis: Secondary | ICD-10-CM | POA: Diagnosis not present

## 2019-06-21 DIAGNOSIS — N289 Disorder of kidney and ureter, unspecified: Secondary | ICD-10-CM | POA: Diagnosis not present

## 2019-06-21 DIAGNOSIS — D649 Anemia, unspecified: Secondary | ICD-10-CM | POA: Diagnosis not present

## 2019-06-21 DIAGNOSIS — C778 Secondary and unspecified malignant neoplasm of lymph nodes of multiple regions: Secondary | ICD-10-CM | POA: Diagnosis not present

## 2019-06-21 LAB — BASIC METABOLIC PANEL
Anion gap: 11 (ref 5–15)
BUN: 18 mg/dL (ref 6–20)
CO2: 26 mmol/L (ref 22–32)
Calcium: 9.2 mg/dL (ref 8.9–10.3)
Chloride: 99 mmol/L (ref 98–111)
Creatinine, Ser: 0.91 mg/dL (ref 0.44–1.00)
GFR calc Af Amer: >60 mL/min (ref >60)
GFR calc non Af Amer: >60 mL/min (ref >60)
Glucose, Bld: 123 mg/dL — ABNORMAL HIGH (ref 70–99)
Potassium: 4 mmol/L (ref 3.5–5.1)
Sodium: 136 mmol/L (ref 135–145)

## 2019-06-21 LAB — SAMPLE TO BLOOD BANK

## 2019-06-21 LAB — CBC WITH DIFFERENTIAL/PLATELET
Abs Immature Granulocytes: 0.05 10*3/uL (ref 0.00–0.07)
Basophils Absolute: 0 10*3/uL (ref 0.0–0.1)
Basophils Relative: 0 %
Eosinophils Absolute: 0.2 10*3/uL (ref 0.0–0.5)
Eosinophils Relative: 5 %
HCT: 29 % — ABNORMAL LOW (ref 36.0–46.0)
Hemoglobin: 9.3 g/dL — ABNORMAL LOW (ref 12.0–15.0)
Immature Granulocytes: 1 %
Lymphocytes Relative: 4 %
Lymphs Abs: 0.2 10*3/uL — ABNORMAL LOW (ref 0.7–4.0)
MCH: 30.1 pg (ref 26.0–34.0)
MCHC: 32.1 g/dL (ref 30.0–36.0)
MCV: 93.9 fL (ref 80.0–100.0)
Monocytes Absolute: 0.5 10*3/uL (ref 0.1–1.0)
Monocytes Relative: 10 %
Neutro Abs: 4 10*3/uL (ref 1.7–7.7)
Neutrophils Relative %: 80 %
Platelets: 353 10*3/uL (ref 150–400)
RBC: 3.09 MIL/uL — ABNORMAL LOW (ref 3.87–5.11)
RDW: 16.1 % — ABNORMAL HIGH (ref 11.5–15.5)
WBC: 4.9 10*3/uL (ref 4.0–10.5)
nRBC: 0 % (ref 0.0–0.2)

## 2019-06-21 NOTE — Progress Notes (Signed)
Patient is doing well no complaints 

## 2019-06-22 ENCOUNTER — Inpatient Hospital Stay: Payer: BC Managed Care – PPO

## 2019-06-22 ENCOUNTER — Inpatient Hospital Stay (HOSPITAL_BASED_OUTPATIENT_CLINIC_OR_DEPARTMENT_OTHER): Payer: BC Managed Care – PPO | Admitting: Hospice and Palliative Medicine

## 2019-06-22 ENCOUNTER — Other Ambulatory Visit: Payer: Self-pay

## 2019-06-22 ENCOUNTER — Ambulatory Visit
Admission: RE | Admit: 2019-06-22 | Discharge: 2019-06-22 | Disposition: A | Discharge status: Home / Self Care | Payer: BC Managed Care – PPO | Source: Ambulatory Visit | Attending: Radiation Oncology | Admitting: Radiation Oncology

## 2019-06-22 VITALS — BP 126/76 | HR 98 | Temp 97.6°F | Resp 18

## 2019-06-22 DIAGNOSIS — C530 Malignant neoplasm of endocervix: Secondary | ICD-10-CM | POA: Diagnosis not present

## 2019-06-22 DIAGNOSIS — C772 Secondary and unspecified malignant neoplasm of intra-abdominal lymph nodes: Secondary | ICD-10-CM | POA: Diagnosis not present

## 2019-06-22 DIAGNOSIS — D649 Anemia, unspecified: Secondary | ICD-10-CM | POA: Diagnosis not present

## 2019-06-22 DIAGNOSIS — Z5111 Encounter for antineoplastic chemotherapy: Secondary | ICD-10-CM | POA: Diagnosis not present

## 2019-06-22 DIAGNOSIS — C778 Secondary and unspecified malignant neoplasm of lymph nodes of multiple regions: Secondary | ICD-10-CM | POA: Diagnosis not present

## 2019-06-22 DIAGNOSIS — F419 Anxiety disorder, unspecified: Secondary | ICD-10-CM | POA: Diagnosis not present

## 2019-06-22 DIAGNOSIS — C539 Malignant neoplasm of cervix uteri, unspecified: Secondary | ICD-10-CM | POA: Diagnosis not present

## 2019-06-22 DIAGNOSIS — N289 Disorder of kidney and ureter, unspecified: Secondary | ICD-10-CM | POA: Diagnosis not present

## 2019-06-22 DIAGNOSIS — Z79899 Other long term (current) drug therapy: Secondary | ICD-10-CM | POA: Diagnosis not present

## 2019-06-22 DIAGNOSIS — F17210 Nicotine dependence, cigarettes, uncomplicated: Secondary | ICD-10-CM | POA: Diagnosis not present

## 2019-06-22 DIAGNOSIS — Z515 Encounter for palliative care: Secondary | ICD-10-CM

## 2019-06-22 DIAGNOSIS — Z510 Encounter for antineoplastic radiation therapy: Secondary | ICD-10-CM | POA: Diagnosis not present

## 2019-06-22 DIAGNOSIS — R109 Unspecified abdominal pain: Secondary | ICD-10-CM | POA: Diagnosis not present

## 2019-06-22 DIAGNOSIS — N1330 Unspecified hydronephrosis: Secondary | ICD-10-CM | POA: Diagnosis not present

## 2019-06-22 MED ORDER — SODIUM CHLORIDE 0.9 % IV SOLN
40.0000 mg/m2 | Freq: Once | INTRAVENOUS | Status: AC
  Administered 2019-06-22: 61 mg via INTRAVENOUS
  Filled 2019-06-22: qty 61

## 2019-06-22 MED ORDER — PALONOSETRON HCL INJECTION 0.25 MG/5ML
0.2500 mg | Freq: Once | INTRAVENOUS | Status: AC
  Administered 2019-06-22: 0.25 mg via INTRAVENOUS
  Filled 2019-06-22: qty 5

## 2019-06-22 MED ORDER — HEPARIN SOD (PORK) LOCK FLUSH 100 UNIT/ML IV SOLN
500.0000 [IU] | Freq: Once | INTRAVENOUS | Status: AC | PRN
  Administered 2019-06-22: 500 [IU]
  Filled 2019-06-22: qty 5

## 2019-06-22 MED ORDER — SODIUM CHLORIDE 0.9 % IV SOLN
Freq: Once | INTRAVENOUS | Status: AC
  Administered 2019-06-22: 09:00:00 via INTRAVENOUS
  Filled 2019-06-22: qty 250

## 2019-06-22 MED ORDER — POTASSIUM CHLORIDE 2 MEQ/ML IV SOLN
Freq: Once | INTRAVENOUS | Status: AC
  Administered 2019-06-22: 10:00:00 via INTRAVENOUS
  Filled 2019-06-22: qty 1000

## 2019-06-22 MED ORDER — ALPRAZOLAM 0.25 MG PO TABS: 0.2500 mg | ORAL_TABLET | Freq: Every evening | ORAL | 0 refills | Status: DC | PRN

## 2019-06-22 MED ORDER — SODIUM CHLORIDE 0.9 % IV SOLN
Freq: Once | INTRAVENOUS | Status: AC
  Administered 2019-06-22: 12:00:00 via INTRAVENOUS
  Filled 2019-06-22: qty 5

## 2019-06-22 MED ORDER — SODIUM CHLORIDE 0.9% FLUSH
10.0000 mL | INTRAVENOUS | Status: DC | PRN
  Administered 2019-06-22: 10 mL
  Filled 2019-06-22: qty 10

## 2019-06-22 NOTE — Progress Notes (Addendum)
[place]  [phone] [phone]  ID: Mariah Mcmahon OB: 02/11/1966  MR#: [id number]  XFG#:[id number]  Patient Care Team: Venita Lick, NP as PCP - General (Nurse Practitioner) Clent Jacks, RN as Oncology Nurse Navigator  CHIEF COMPLAINT: Stage IIIb cervical cancer  INTERVAL HISTORY: Patient returns to clinic today for further evaluation and consideration of cycle 6 of weekly cisplatin.  She has noticed increased vaginal bleeding this week.  She continues to be highly anxious. She does not complain of abdominal pain or bloating today. She has no neurologic complaints.  She denies any recent fevers or illnesses.  She has a good appetite and denies weight loss.  She has no chest pain, shortness of breath, cough, or hemoptysis.  She denies any nausea, vomiting, constipation, or diarrhea.  She has no urinary complaints.  Patient offers no further specific complaints today.  REVIEW OF SYSTEMS:   Review of Systems  Constitutional: Negative.  Negative for fever, malaise/fatigue and weight loss.  Respiratory: Negative.  Negative for cough and shortness of breath.   Cardiovascular: Negative.  Negative for chest pain and leg swelling.  Gastrointestinal: Negative for abdominal pain.  Genitourinary: Negative.  Negative for hematuria.  Musculoskeletal: Negative.  Negative for back pain.  Skin: Negative.  Negative for rash.  Neurological: Negative.  Negative for dizziness, focal weakness, weakness and headaches.  Psychiatric/Behavioral: The patient is nervous/anxious.     As per HPI. Otherwise, a complete review of systems is negative.  PAST MEDICAL HISTORY: Past Medical History:  Diagnosis Date   Cancer ([place])    cwervical cancer   No pertinent past medical history     PAST SURGICAL HISTORY: Past Surgical History:  Procedure Laterality Date   MASS EXCISION     Throat   PERIPHERAL VASCULAR THROMBECTOMY Left 06/05/2019   Procedure:  PERIPHERAL VASCULAR THROMBECTOMY WITH IVC FILTER (LEFT LOWER EXTREMITY);  Surgeon: Katha Cabal, MD;  Location: [facility];  Service: Cardiovascular;  Laterality: Left;   PORTA CATH INSERTION N/A 06/06/2019   Procedure: PORTA CATH INSERTION;  Surgeon: Katha Cabal, MD;  Location: [facility];  Service: Cardiovascular;  Laterality: N/A;   TONSILLECTOMY      FAMILY HISTORY: Family History  Problem Relation Age of Onset   Brain cancer Mother 30   Alcohol abuse Father    Heart disease Father    Prostate cancer Father    Heart attack Father    Diabetes Father     ADVANCED DIRECTIVES (Y/N):  N  HEALTH MAINTENANCE: Social History   Tobacco Use   Smoking status: Current Every Day Smoker    Packs/day: 0.50    Years: 30.00    Pack years: 15.00    Types: Cigarettes   Smokeless tobacco: Never Used  Substance Use Topics   Alcohol use: Not Currently   Drug use: Never     Colonoscopy:  PAP:  Bone density:  Lipid panel:  No Known Allergies  Current Outpatient Medications  Medication Sig Dispense Refill   acetaminophen (TYLENOL) 500 MG tablet Take 500 mg by mouth every 6 (six) hours as needed.     ALPRAZolam (XANAX) 0.25 MG tablet Take 1 tablet (0.25 mg total) by mouth at bedtime as needed for anxiety. 15 tablet 0   cyclobenzaprine (FLEXERIL) 10 MG tablet Take 1 tablet by mouth 1 day or 1 dose.     Eliquis DVT/PE Starter Pack (ELIQUIS STARTER PACK) 5 MG TABS Take as directed on  package: start with two-'5mg'$  tablets twice daily for 7 days. On day 8, switch to one-'5mg'$  tablet twice daily. 1 each 0   methocarbamol (ROBAXIN) 500 MG tablet Take 1 tablet (500 mg total) by mouth 2 (two) times daily as needed for muscle spasms (for lower back pain). 120 tablet 1   ondansetron (ZOFRAN) 8 MG tablet Take 1 tablet (8 mg total) by mouth 2 (two) times daily as needed. Start on the third day after chemotherapy. 30 tablet 2   No current  facility-administered medications for this visit.    Facility-Administered Medications Ordered in Other Visits  Medication Dose Route Frequency Provider Last Rate Last Dose   fosaprepitant (EMEND) 150 mg, dexamethasone (DECADRON) 12 mg in sodium chloride 0.9 % 145 mL IVPB   Intravenous Once Lloyd Huger, MD       palonosetron (ALOXI) injection 0.25 mg  0.25 mg Intravenous Once Lloyd Huger, MD        OBJECTIVE: Vitals:   06/28/19 0903  BP: 112/78  Pulse: 100     Body mass index is 19.94 kg/m.    ECOG FS:0 - Asymptomatic  General: Well-developed, well-nourished, no acute distress. Eyes: Pink conjunctiva, anicteric sclera. HEENT: Normocephalic, moist mucous membranes. Lungs: Clear to auscultation bilaterally. Heart: Regular rate and rhythm. No rubs, murmurs, or gallops. Abdomen: Soft, nontender, nondistended. No organomegaly noted, normoactive bowel sounds. Musculoskeletal: No edema, cyanosis, or clubbing. Neuro: Alert, answering all questions appropriately. Cranial nerves grossly intact. Skin: No rashes or petechiae noted. Psych: Normal affect.  LAB RESULTS:  Lab Results  Component Value Date   NA 137 06/28/2019   K 3.9 06/28/2019   CL 103 06/28/2019   CO2 25 06/28/2019   GLUCOSE 100 (H) 06/28/2019   BUN 18 06/28/2019   CREATININE 0.84 06/28/2019   CALCIUM 9.1 06/28/2019   PROT 6.4 (L) 06/05/2019   ALBUMIN 2.9 (L) 06/05/2019   AST 14 (L) 06/05/2019   ALT 9 06/05/2019   ALKPHOS 85 06/05/2019   BILITOT 0.4 06/05/2019   GFRNONAA >60 06/28/2019   GFRAA >60 06/28/2019    Lab Results  Component Value Date   WBC 4.3 06/28/2019   NEUTROABS 3.4 06/28/2019   HGB 8.8 (L) 06/28/2019   HCT 26.8 (L) 06/28/2019   MCV 93.7 06/28/2019   PLT 302 06/28/2019     STUDIES: Dg Chest 1 View  Result Date: 06/05/2019 CLINICAL DATA:  Shortness of breath and leg swelling. EXAM: CHEST  1 VIEW COMPARISON:  Chest CT 05/01/2019 FINDINGS: The cardiac silhouette,  mediastinal and hilar contours are within normal limits and stable. The lungs are clear of an acute process. No infiltrates, edema or effusions. No worrisome pulmonary lesions. The bony thorax is intact. IMPRESSION: No acute cardiopulmonary findings and no worrisome pulmonary lesions. Electronically Signed   By: Marijo Sanes M.D.   On: 06/05/2019 11:39   Ct Angio Chest Pe W And/or Wo Contrast  Result Date: 06/05/2019 CLINICAL DATA:  Shortness of breath. Lower extremity edema. Stage IIIB squamous cell cervical carcinoma EXAM: CT ANGIOGRAPHY CHEST WITH CONTRAST TECHNIQUE: Multidetector CT imaging of the chest was performed using the standard protocol during bolus administration of intravenous contrast. Multiplanar CT image reconstructions and MIPs were obtained to evaluate the vascular anatomy. CONTRAST:  32m OMNIPAQUE IOHEXOL 350 MG/ML SOLN COMPARISON:  PET-CT May 07, 2019 FINDINGS: Cardiovascular: There are several peripheral left lower lobe pulmonary artery emboli, most notably in the anterior, lateral, and posterior segment left lower lobe pulmonary artery branches. No more  central pulmonary embolus evident. There is no demonstrable right heart strain. No thoracic aortic aneurysm or dissection. Visualized great vessels appear unremarkable. No pericardial effusion or pericardial thickening evident. Mediastinum/Nodes: Thyroid appears unremarkable. There are several mildly prominent retrocrural lymph nodes, largest measuring 1.2 x 1.0 cm. No adenopathy elsewhere on this study. No esophageal lesions evident. Lungs/Pleura: There is atelectatic change in each lung base with suspected superimposed pneumonia in the posterior left base. No appreciable pleural effusion. On axial slice 40 series 6, there is a 2 mm nodular opacity in the anterior segment of the right upper lobe. Upper Abdomen: Visualized upper abdominal structures appear unremarkable except for a degree of retrocrural adenopathy seen on recent PET  study. Musculoskeletal: There are no blastic or lytic bone lesions. No evident chest wall lesions. Review of the MIP images confirms the above findings. IMPRESSION: 1. Several small peripheral left lower lobe pulmonary emboli. No more central pulmonary embolus. No right heart strain. 2.  No thoracic aortic aneurysm or dissection. 3. Bibasilar atelectasis with suspected mild pneumonia posterior left base. 2 mm nodular opacity right upper lobe which must be concerning for small potential metastasis given known cervical carcinoma. 3. There is a degree of retrocrural adenopathy, noted on recent PET study. Critical Value/emergent results were called by telephone at the time of interpretation on 06/05/2019 at 4:42 pm to Dr. Nicholes Mango , who verbally acknowledged these results. Electronically Signed   By: Lowella Grip III M.D.   On: 06/05/2019 16:42   US Venous Img Lower Unilateral Left  Result Date: 06/05/2019 CLINICAL DATA:  Left lower extremity edema. History of smoking and malignancy. Evaluate for DVT. EXAM: LEFT LOWER EXTREMITY VENOUS DOPPLER ULTRASOUND TECHNIQUE: Gray-scale sonography with graded compression, as well as color Doppler and duplex ultrasound were performed to evaluate the lower extremity deep venous systems from the level of the common femoral vein and including the common femoral, femoral, profunda femoral, popliteal and calf veins including the posterior tibial, peroneal and gastrocnemius veins when visible. The superficial great saphenous vein was also interrogated. Spectral Doppler was utilized to evaluate flow at rest and with distal augmentation maneuvers in the common femoral, femoral and popliteal veins. COMPARISON:  None. FINDINGS: Contralateral Common Femoral Vein: Respiratory phasicity is normal and symmetric with the symptomatic side. No evidence of thrombus. Normal compressibility. There is mixed echogenic expansile occlusive thrombus extending from the left common femoral vein  (image 8) through the saphenofemoral junction and proximal aspect of the greater saphenous vein (image 12) as well as the imaged aspects of the left deep femoral vein (image 15). There is hypoechoic occlusive thrombus involving the proximal (image 19), mid (image 22) and distal (image 26) aspects of the left femoral vein, extending to involve the proximal (image 30) and distal (image 34) aspects of the popliteal vein as well as both paired left posterior tibial and peroneal veins. Other Findings:  None. IMPRESSION: Examination is positive for extensive acute occlusive DVT extending from the left common femoral vein through the left popliteal veins. Electronically Signed   By: Sandi Mariscal M.D.   On: 06/05/2019 12:20   Vas [place] Lower Extremity Venous (dvt)  Result Date: 06/21/2019  Lower Venous Study Risk Factors: DVT DVT post procedure x 2 weeks. Comparison Study: outside study 06/05/2019 Performing Technologist: Concha Norway RVT  Examination Guidelines: A complete evaluation includes B-mode imaging, spectral Doppler, color Doppler, and power Doppler as needed of all accessible portions of each vessel. Bilateral testing is considered an integral part of  a complete examination. Limited examinations for reoccurring indications may be performed as noted.  +-----+---------------+---------+-----------+----------+-------+  RIGHT Compressibility Phasicity Spontaneity Properties Summary  +-----+---------------+---------+-----------+----------+-------+  CFV   Full            Yes       Yes                             +-----+---------------+---------+-----------+----------+-------+  SFJ   Full                                                      +-----+---------------+---------+-----------+----------+-------+   +----+---------------+---------+-----------+----------+-------+  LEFT Compressibility Phasicity Spontaneity Properties Summary  +----+---------------+---------+-----------+----------+-------+       Full            Yes        Yes                             +----+---------------+---------+-----------+----------+-------+     Summary: Right: No evidence of common femoral vein obstruction. Left: There is no evidence of deep vein thrombosis in the lower extremity.There is no evidence of superficial venous thrombosis. No residual thrombus seen from CIV throughout the left lower extremity. Left CIV stent seen and is patent.  *See table(s) above for measurements and observations. Electronically signed by Hortencia Pilar MD on 06/21/2019 at 5:02:10 PM.    Final     ASSESSMENT: Stage IIIb cervical cancer.  PLAN:    1.  Stage IIIb cervical cancer: Biopsy results from April 24, 2019 confirmed the diagnosis.  PET scan results from May 07, 2019 reviewed independently with extensive nodal disease as well as mass-effect causing possible hydronephrosis.  Case discussed with gynecology oncology as well as radiation oncology.  Patient is not a surgical candidate as of right now, but will likely benefit from daily XRT along with concurrent weekly cisplatin 40 mg/m2.  Plan to reimage after the conclusion of XRT to determine if brachii therapy is possible.  Will also considered rebiopsy with interventional radiology to obtain additional tissue for PDL-1 testing.  If there is still residual disease, she benefit from systemic treatment with carboplatinum/Taxol or cisplatin/Taxol plus or minus bevacizumab.  Continue daily XRT completed on July 03, 2019.  Proceed with her sixth and final infusion of weekly cisplatin today.  Patient will return to clinic in 3 weeks for further evaluation at which time will reimage with PET scan to determine next course of treatment.   2.  Hydronephrosis: Seen on CT scan.  Monitor closely. 3.  Venous access: Patient now has had a port placed. 4.  Renal insufficiency: Resolved. 5.  Anemia: Patient's hemoglobin has trended down slightly to 8.8.  Monitor closely with active vaginal bleeding and on Eliquis.   6.   Abdominal pain: Improved.  Continue hydrocodone as prescribed.  Patient also continues to use meloxicam sparingly. 7.  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  She will require Eliquis for minimum 6 months, but she will likely require extended treatment.  Monitor closely with vaginal bleeding as above.   Patient expressed understanding and was in agreement with this plan. She also understands that She can call clinic at any time with any questions, concerns, or complaints.   Cancer Staging Cervical cancer (  [place]) Staging form: Cervix Uteri, AJCC 8th Edition - Clinical: FIGO Stage IIIB (cT3b, cN1, cM0) - Signed by Lloyd Huger, MD on 05/09/2019   Lloyd Huger, MD   06/28/2019 10:20 AM   Addendum: Patient had a PET scan on July 10, 2019 and was also evaluated by gynecology oncology several days later.  Patient continues to have persistent disease that would be outside the field for brachii therapy.  After further discussion it was decided upon to proceed with systemic chemotherapy using carboplatinum, Taxol, and Avastin every 3 weeks for 4-6 cycles.  Patient's treatment is scheduled for Friday, therefore she will require On Pro Neulasta support since she will be unable to receive injection over the weekend.  Return to clinic on July 20, 2019 initiate cycle 1.   Lloyd Huger, MD 07/13/19 1:10 PM

## 2019-06-22 NOTE — Progress Notes (Signed)
[facility]  [phone] [phone]   Name: Mariah Mcmahon Date: 06/22/2019 MRN: [id number]  DOB: 1966/04/14  Patient Care Team: Venita Lick, NP as PCP - General (Nurse Practitioner) Clent Jacks, RN as Oncology Nurse Navigator    REASON FOR CONSULTATION: Palliative Care consult requested for this 53 y.o. 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  Patient is felt not to be a surgical candidate and treatment was initiated with concurrent chemoradiation.  Patient was hospitalized 06/05/2019 -06/07/2019 with acute left lower extremity DVT/pulmonary embolism status post thrombolysis and IVC filter placement.  Patient was referred to palliative care to help address goals and manage ongoing symptoms.   SOCIAL HISTORY:     reports that she has been smoking cigarettes. She has a 15.00 pack-year smoking history. She has never used smokeless tobacco. She reports previous alcohol use. She reports that she does not use drugs.  Patient is not married.  She lives at home alone.  She has a son who is involved in her care, who lives nearby.  Patient previously worked at [employer] in [place] and recently stopped working due to [employer].  ADVANCE DIRECTIVES:  Does not have  CODE STATUS:   PAST MEDICAL HISTORY: Past Medical History:  Diagnosis Date   Cancer ([place])    cwervical cancer   No pertinent past medical history     PAST SURGICAL HISTORY:  Past Surgical History:  Procedure Laterality Date   MASS EXCISION     Throat   PERIPHERAL VASCULAR THROMBECTOMY Left 06/05/2019   Procedure: PERIPHERAL VASCULAR THROMBECTOMY WITH IVC FILTER (LEFT LOWER EXTREMITY);  Surgeon: Katha Cabal, MD;  Location: [facility];  Service: Cardiovascular;  Laterality: Left;   PORTA CATH INSERTION N/A  06/06/2019   Procedure: PORTA CATH INSERTION;  Surgeon: Katha Cabal, MD;  Location: [facility];  Service: Cardiovascular;  Laterality: N/A;   TONSILLECTOMY      HEMATOLOGY/ONCOLOGY HISTORY:  Oncology History  Cervical cancer ([place])  05/04/2019 Initial Diagnosis   Cervical cancer ([place])   05/09/2019 Cancer Staging   Staging form: Cervix Uteri, AJCC 8th Edition - Clinical: FIGO Stage IIIB (cT3b, cN1, cM0) - Signed by Lloyd Huger, MD on 05/09/2019   05/17/2019 -  Chemotherapy   The patient had palonosetron (ALOXI) injection 0.25 mg, 0.25 mg, Intravenous,  Once, 5 of 7 cycles Administration: 0.25 mg (05/17/2019), 0.25 mg (05/24/2019), 0.25 mg (05/31/2019), 0.25 mg (06/14/2019), 0.25 mg (06/22/2019) CISplatin (PLATINOL) 61 mg in sodium chloride 0.9 % 250 mL chemo infusion, 40 mg/m2 = 61 mg, Intravenous,  Once, 5 of 7 cycles Administration: 61 mg (05/17/2019), 61 mg (05/24/2019), 61 mg (05/31/2019), 61 mg (06/14/2019), 61 mg (06/22/2019) fosaprepitant (EMEND) 150 mg, dexamethasone (DECADRON) 12 mg in sodium chloride 0.9 % 145 mL IVPB, , Intravenous,  Once, 5 of 7 cycles Administration:  (05/17/2019),  (05/24/2019),  (05/31/2019),  (06/14/2019),  (06/22/2019)  for chemotherapy treatment.      ALLERGIES:  has No Known Allergies.  MEDICATIONS:  Current Outpatient Medications  Medication Sig Dispense Refill   acetaminophen (TYLENOL) 500 MG tablet Take 500 mg by mouth every 6 (six) hours as needed.     cyclobenzaprine (FLEXERIL) 10 MG tablet Take 1 tablet by mouth 1 day or 1 dose.     Eliquis DVT/PE Starter Pack (ELIQUIS STARTER PACK) 5 MG  TABS Take as directed on package: start with two-5mg  tablets twice daily for 7 days. On day 8, switch to one-5mg  tablet twice daily. 1 each 0   methocarbamol (ROBAXIN) 500 MG tablet Take 1 tablet (500 mg total) by mouth 2 (two) times daily as needed for muscle spasms (for lower back pain). 120 tablet 1   ondansetron (ZOFRAN) 8 MG tablet Take 1 tablet (8 mg  total) by mouth 2 (two) times daily as needed. Start on the third day after chemotherapy. 30 tablet 2   No current facility-administered medications for this visit.    Facility-Administered Medications Ordered in Other Visits  Medication Dose Route Frequency Provider Last Rate Last Dose   sodium chloride flush (NS) 0.9 % injection 10 mL  10 mL Intracatheter PRN Lloyd Huger, MD   10 mL at 06/22/19 0905    VITAL SIGNS: There were no vitals taken for this visit. There were no vitals filed for this visit.  Estimated body mass index is 20.19 kg/m as calculated from the following:   Height as of 06/18/19: 5\' 2"  (1.575 m).   Weight as of 06/21/19: 110 lb 6.4 oz (50.1 kg).  LABS: CBC:    Component Value Date/Time   WBC 4.9 06/21/2019 0938   HGB 9.3 (L) 06/21/2019 0938   HGB 14.5 04/09/2019 0918   HCT 29.0 (L) 06/21/2019 0938   HCT 44.2 04/09/2019 0918   PLT 353 06/21/2019 0938   PLT 401 04/09/2019 0918   MCV 93.9 06/21/2019 0938   MCV 99 (H) 04/09/2019 0918   NEUTROABS 4.0 06/21/2019 0938   NEUTROABS 5.9 04/09/2019 0918   LYMPHSABS 0.2 (L) 06/21/2019 0938   LYMPHSABS 1.3 04/09/2019 0918   MONOABS 0.5 06/21/2019 0938   EOSABS 0.2 06/21/2019 0938   EOSABS 0.2 04/09/2019 0918   BASOSABS 0.0 06/21/2019 0938   BASOSABS 0.1 04/09/2019 0918   Comprehensive Metabolic Panel:    Component Value Date/Time   NA 136 06/21/2019 0938   NA 138 04/09/2019 0918   K 4.0 06/21/2019 0938   CL 99 06/21/2019 0938   CO2 26 06/21/2019 0938   BUN 18 06/21/2019 0938   BUN 12 04/09/2019 0918   CREATININE 0.91 06/21/2019 0938   GLUCOSE 123 (H) 06/21/2019 0938   CALCIUM 9.2 06/21/2019 0938   AST 14 (L) 06/05/2019 1425   ALT 9 06/05/2019 1425   ALKPHOS 85 06/05/2019 1425   BILITOT 0.4 06/05/2019 1425   BILITOT 0.4 04/09/2019 0918   PROT 6.4 (L) 06/05/2019 1425   PROT 6.8 04/09/2019 0918   ALBUMIN 2.9 (L) 06/05/2019 1425   ALBUMIN 4.0 04/09/2019 0918    RADIOGRAPHIC STUDIES: Dg Chest  1 View  Result Date: 06/05/2019 CLINICAL DATA:  Shortness of breath and leg swelling. EXAM: CHEST  1 VIEW COMPARISON:  Chest CT 05/01/2019 FINDINGS: The cardiac silhouette, mediastinal and hilar contours are within normal limits and stable. The lungs are clear of an acute process. No infiltrates, edema or effusions. No worrisome pulmonary lesions. The bony thorax is intact. IMPRESSION: No acute cardiopulmonary findings and no worrisome pulmonary lesions. Electronically Signed   By: Marijo Sanes M.D.   On: 06/05/2019 11:39   Ct Angio Chest Pe W And/or Wo Contrast  Result Date: 06/05/2019 CLINICAL DATA:  Shortness of breath. Lower extremity edema. Stage IIIB squamous cell cervical carcinoma EXAM: CT ANGIOGRAPHY CHEST WITH CONTRAST TECHNIQUE: Multidetector CT imaging of the chest was performed using the standard protocol during bolus administration of intravenous contrast. Multiplanar CT  image reconstructions and MIPs were obtained to evaluate the vascular anatomy. CONTRAST:  62mL OMNIPAQUE IOHEXOL 350 MG/ML SOLN COMPARISON:  PET-CT May 07, 2019 FINDINGS: Cardiovascular: There are several peripheral left lower lobe pulmonary artery emboli, most notably in the anterior, lateral, and posterior segment left lower lobe pulmonary artery branches. No more central pulmonary embolus evident. There is no demonstrable right heart strain. No thoracic aortic aneurysm or dissection. Visualized great vessels appear unremarkable. No pericardial effusion or pericardial thickening evident. Mediastinum/Nodes: Thyroid appears unremarkable. There are several mildly prominent retrocrural lymph nodes, largest measuring 1.2 x 1.0 cm. No adenopathy elsewhere on this study. No esophageal lesions evident. Lungs/Pleura: There is atelectatic change in each lung base with suspected superimposed pneumonia in the posterior left base. No appreciable pleural effusion. On axial slice 40 series 6, there is a 2 mm nodular opacity in the anterior  segment of the right upper lobe. Upper Abdomen: Visualized upper abdominal structures appear unremarkable except for a degree of retrocrural adenopathy seen on recent PET study. Musculoskeletal: There are no blastic or lytic bone lesions. No evident chest wall lesions. Review of the MIP images confirms the above findings. IMPRESSION: 1. Several small peripheral left lower lobe pulmonary emboli. No more central pulmonary embolus. No right heart strain. 2.  No thoracic aortic aneurysm or dissection. 3. Bibasilar atelectasis with suspected mild pneumonia posterior left base. 2 mm nodular opacity right upper lobe which must be concerning for small potential metastasis given known cervical carcinoma. 3. There is a degree of retrocrural adenopathy, noted on recent PET study. Critical Value/emergent results were called by telephone at the time of interpretation on 06/05/2019 at 4:42 pm to Dr. Nicholes Mango , who verbally acknowledged these results. Electronically Signed   By: Lowella Grip III M.D.   On: 06/05/2019 16:42   US Venous Img Lower Unilateral Left  Result Date: 06/05/2019 CLINICAL DATA:  Left lower extremity edema. History of smoking and malignancy. Evaluate for DVT. EXAM: LEFT LOWER EXTREMITY VENOUS DOPPLER ULTRASOUND TECHNIQUE: Gray-scale sonography with graded compression, as well as color Doppler and duplex ultrasound were performed to evaluate the lower extremity deep venous systems from the level of the common femoral vein and including the common femoral, femoral, profunda femoral, popliteal and calf veins including the posterior tibial, peroneal and gastrocnemius veins when visible. The superficial great saphenous vein was also interrogated. Spectral Doppler was utilized to evaluate flow at rest and with distal augmentation maneuvers in the common femoral, femoral and popliteal veins. COMPARISON:  None. FINDINGS: Contralateral Common Femoral Vein: Respiratory phasicity is normal and symmetric with  the symptomatic side. No evidence of thrombus. Normal compressibility. There is mixed echogenic expansile occlusive thrombus extending from the left common femoral vein (image 8) through the saphenofemoral junction and proximal aspect of the greater saphenous vein (image 12) as well as the imaged aspects of the left deep femoral vein (image 15). There is hypoechoic occlusive thrombus involving the proximal (image 19), mid (image 22) and distal (image 26) aspects of the left femoral vein, extending to involve the proximal (image 30) and distal (image 34) aspects of the popliteal vein as well as both paired left posterior tibial and peroneal veins. Other Findings:  None. IMPRESSION: Examination is positive for extensive acute occlusive DVT extending from the left common femoral vein through the left popliteal veins. Electronically Signed   By: Sandi Mariscal M.D.   On: 06/05/2019 12:20   Vas [place] Lower Extremity Venous (dvt)  Result Date: 06/21/2019  Lower  Venous Study Risk Factors: DVT DVT post procedure x 2 weeks. Comparison Study: outside study 06/05/2019 Performing Technologist: Concha Norway RVT  Examination Guidelines: A complete evaluation includes B-mode imaging, spectral Doppler, color Doppler, and power Doppler as needed of all accessible portions of each vessel. Bilateral testing is considered an integral part of a complete examination. Limited examinations for reoccurring indications may be performed as noted.  +-----+---------------+---------+-----------+----------+-------+  RIGHT Compressibility Phasicity Spontaneity Properties Summary  +-----+---------------+---------+-----------+----------+-------+  CFV   Full            Yes       Yes                             +-----+---------------+---------+-----------+----------+-------+  SFJ   Full                                                      +-----+---------------+---------+-----------+----------+-------+    +----+---------------+---------+-----------+----------+-------+  LEFT Compressibility Phasicity Spontaneity Properties Summary  +----+---------------+---------+-----------+----------+-------+       Full            Yes       Yes                             +----+---------------+---------+-----------+----------+-------+     Summary: Right: No evidence of common femoral vein obstruction. Left: There is no evidence of deep vein thrombosis in the lower extremity.There is no evidence of superficial venous thrombosis. No residual thrombus seen from CIV throughout the left lower extremity. Left CIV stent seen and is patent.  *See table(s) above for measurements and observations. Electronically signed by Hortencia Pilar MD on 06/21/2019 at 5:02:10 PM.    Final     PERFORMANCE STATUS (ECOG) : 1 - Symptomatic but completely ambulatory  Review of Systems Unless otherwise noted, a complete review of systems is negative.  Physical Exam General: NAD, frail appearing, thin Pulmonary: Unlabored Extremities: no edema Skin: no rashes Neurological: Weakness but otherwise nonfocal  IMPRESSION: Routine follow-up visit made today.  Patient was seen in infusion while she was receiving treatment.  Patient reports that she has been doing reasonably well since discharging her from the hospital on 06/07/2019.  However, she says that she has had cases of fairly severe anxiety particularly at night when she is trying to go to sleep.  She says she keeps thinking about her hospital stay and being diagnosed with a DVT.  Patient would be interested in medication to help improve her anxiety and help her sleep.  She denies depressive symptoms.  She would be interested in a trial of a low-dose benzodiazepine.  Future consideration could be given for starting an antidepressant although stabilization of anxiety would likely take weeks following initiation.  Patient reports that she has had some intermittent vaginal spotting over the  past 24 hours.  She denies persistent bleeding.  She is anticoagulated on therapeutic dose of Eliquis following her recent DVT/PE.  Case discussed with Dr. Grayland Ormond.  Risk of dose reduction of Eliquis considered too great given recent clotting events.  Patient to monitor symptoms.  She can utilize ER in the event of heavy bleeding.  PDMP reviewed.  PLAN: -Continue current scope of treatment -ACP documents and most form previously reviewed -Trial of  alprazolam 0.25 mg nightly as needed (#15) -RTC in 4 weeks   Patient expressed understanding and was in agreement with this plan. She also understands that She can call the clinic at any time with any questions, concerns, or complaints.     Time Total: 15 minutes  Visit consisted of counseling and education dealing with the complex and emotionally intense issues of symptom management and palliative care in the setting of serious and potentially life-threatening illness.Greater than 50%  of this time was spent counseling and coordinating care related to the above assessment and plan.  Signed by: Altha Harm, PhD, NP-C [phone] (Work Cell)

## 2019-06-25 ENCOUNTER — Ambulatory Visit: Payer: BC Managed Care – PPO

## 2019-06-25 ENCOUNTER — Other Ambulatory Visit: Payer: Self-pay

## 2019-06-25 ENCOUNTER — Ambulatory Visit
Admission: RE | Admit: 2019-06-25 | Discharge: 2019-06-25 | Disposition: A | Discharge status: Home / Self Care | Payer: BC Managed Care – PPO | Source: Ambulatory Visit | Attending: Radiation Oncology | Admitting: Radiation Oncology

## 2019-06-25 DIAGNOSIS — C530 Malignant neoplasm of endocervix: Secondary | ICD-10-CM | POA: Diagnosis not present

## 2019-06-25 DIAGNOSIS — C772 Secondary and unspecified malignant neoplasm of intra-abdominal lymph nodes: Secondary | ICD-10-CM | POA: Diagnosis not present

## 2019-06-25 DIAGNOSIS — C539 Malignant neoplasm of cervix uteri, unspecified: Secondary | ICD-10-CM | POA: Diagnosis not present

## 2019-06-25 DIAGNOSIS — N289 Disorder of kidney and ureter, unspecified: Secondary | ICD-10-CM | POA: Diagnosis not present

## 2019-06-25 DIAGNOSIS — Z5111 Encounter for antineoplastic chemotherapy: Secondary | ICD-10-CM | POA: Diagnosis not present

## 2019-06-25 DIAGNOSIS — Z510 Encounter for antineoplastic radiation therapy: Secondary | ICD-10-CM | POA: Diagnosis not present

## 2019-06-25 DIAGNOSIS — D649 Anemia, unspecified: Secondary | ICD-10-CM | POA: Diagnosis not present

## 2019-06-26 ENCOUNTER — Ambulatory Visit: Payer: BC Managed Care – PPO

## 2019-06-26 ENCOUNTER — Ambulatory Visit
Admission: RE | Admit: 2019-06-26 | Discharge: 2019-06-26 | Disposition: A | Discharge status: Home / Self Care | Payer: BC Managed Care – PPO | Source: Ambulatory Visit | Attending: Radiation Oncology | Admitting: Radiation Oncology

## 2019-06-26 ENCOUNTER — Other Ambulatory Visit: Payer: Self-pay

## 2019-06-26 DIAGNOSIS — Z5111 Encounter for antineoplastic chemotherapy: Secondary | ICD-10-CM | POA: Diagnosis not present

## 2019-06-26 DIAGNOSIS — N289 Disorder of kidney and ureter, unspecified: Secondary | ICD-10-CM | POA: Diagnosis not present

## 2019-06-26 DIAGNOSIS — C539 Malignant neoplasm of cervix uteri, unspecified: Secondary | ICD-10-CM | POA: Diagnosis not present

## 2019-06-26 DIAGNOSIS — C772 Secondary and unspecified malignant neoplasm of intra-abdominal lymph nodes: Secondary | ICD-10-CM | POA: Diagnosis not present

## 2019-06-26 DIAGNOSIS — D649 Anemia, unspecified: Secondary | ICD-10-CM | POA: Diagnosis not present

## 2019-06-26 DIAGNOSIS — Z510 Encounter for antineoplastic radiation therapy: Secondary | ICD-10-CM | POA: Diagnosis not present

## 2019-06-26 DIAGNOSIS — C530 Malignant neoplasm of endocervix: Secondary | ICD-10-CM | POA: Diagnosis not present

## 2019-06-27 ENCOUNTER — Ambulatory Visit: Payer: BC Managed Care – PPO

## 2019-06-27 ENCOUNTER — Other Ambulatory Visit: Payer: Self-pay

## 2019-06-27 ENCOUNTER — Ambulatory Visit
Admission: RE | Admit: 2019-06-27 | Discharge: 2019-06-27 | Disposition: A | Discharge status: Home / Self Care | Payer: BC Managed Care – PPO | Source: Ambulatory Visit | Attending: Radiation Oncology | Admitting: Radiation Oncology

## 2019-06-27 DIAGNOSIS — C530 Malignant neoplasm of endocervix: Secondary | ICD-10-CM | POA: Diagnosis not present

## 2019-06-27 DIAGNOSIS — N289 Disorder of kidney and ureter, unspecified: Secondary | ICD-10-CM | POA: Diagnosis not present

## 2019-06-27 DIAGNOSIS — Z5111 Encounter for antineoplastic chemotherapy: Secondary | ICD-10-CM | POA: Diagnosis not present

## 2019-06-27 DIAGNOSIS — C772 Secondary and unspecified malignant neoplasm of intra-abdominal lymph nodes: Secondary | ICD-10-CM | POA: Diagnosis not present

## 2019-06-27 DIAGNOSIS — Z510 Encounter for antineoplastic radiation therapy: Secondary | ICD-10-CM | POA: Diagnosis not present

## 2019-06-27 DIAGNOSIS — D649 Anemia, unspecified: Secondary | ICD-10-CM | POA: Diagnosis not present

## 2019-06-27 DIAGNOSIS — C539 Malignant neoplasm of cervix uteri, unspecified: Secondary | ICD-10-CM | POA: Diagnosis not present

## 2019-06-28 ENCOUNTER — Ambulatory Visit
Admission: RE | Admit: 2019-06-28 | Discharge: 2019-06-28 | Disposition: A | Discharge status: Home / Self Care | Payer: BC Managed Care – PPO | Source: Ambulatory Visit | Attending: Radiation Oncology | Admitting: Radiation Oncology

## 2019-06-28 ENCOUNTER — Other Ambulatory Visit: Payer: Self-pay

## 2019-06-28 ENCOUNTER — Ambulatory Visit: Payer: BC Managed Care – PPO

## 2019-06-28 ENCOUNTER — Encounter: Payer: Self-pay | Admitting: Oncology

## 2019-06-28 ENCOUNTER — Inpatient Hospital Stay: Payer: BC Managed Care – PPO

## 2019-06-28 ENCOUNTER — Inpatient Hospital Stay (HOSPITAL_BASED_OUTPATIENT_CLINIC_OR_DEPARTMENT_OTHER): Payer: BC Managed Care – PPO | Admitting: Oncology

## 2019-06-28 VITALS — BP 112/78 | HR 100 | Wt 109.0 lb

## 2019-06-28 DIAGNOSIS — R109 Unspecified abdominal pain: Secondary | ICD-10-CM | POA: Diagnosis not present

## 2019-06-28 DIAGNOSIS — C778 Secondary and unspecified malignant neoplasm of lymph nodes of multiple regions: Secondary | ICD-10-CM | POA: Diagnosis not present

## 2019-06-28 DIAGNOSIS — F17210 Nicotine dependence, cigarettes, uncomplicated: Secondary | ICD-10-CM | POA: Diagnosis not present

## 2019-06-28 DIAGNOSIS — D649 Anemia, unspecified: Secondary | ICD-10-CM | POA: Diagnosis not present

## 2019-06-28 DIAGNOSIS — C539 Malignant neoplasm of cervix uteri, unspecified: Secondary | ICD-10-CM

## 2019-06-28 DIAGNOSIS — Z5111 Encounter for antineoplastic chemotherapy: Secondary | ICD-10-CM | POA: Diagnosis not present

## 2019-06-28 DIAGNOSIS — Z79899 Other long term (current) drug therapy: Secondary | ICD-10-CM | POA: Diagnosis not present

## 2019-06-28 DIAGNOSIS — C530 Malignant neoplasm of endocervix: Secondary | ICD-10-CM | POA: Diagnosis not present

## 2019-06-28 DIAGNOSIS — N1330 Unspecified hydronephrosis: Secondary | ICD-10-CM | POA: Diagnosis not present

## 2019-06-28 DIAGNOSIS — C772 Secondary and unspecified malignant neoplasm of intra-abdominal lymph nodes: Secondary | ICD-10-CM | POA: Diagnosis not present

## 2019-06-28 DIAGNOSIS — Z510 Encounter for antineoplastic radiation therapy: Secondary | ICD-10-CM | POA: Diagnosis not present

## 2019-06-28 DIAGNOSIS — N289 Disorder of kidney and ureter, unspecified: Secondary | ICD-10-CM | POA: Diagnosis not present

## 2019-06-28 LAB — CBC WITH DIFFERENTIAL/PLATELET
Abs Immature Granulocytes: 0.02 10*3/uL (ref 0.00–0.07)
Basophils Absolute: 0 10*3/uL (ref 0.0–0.1)
Basophils Relative: 1 %
Eosinophils Absolute: 0.3 10*3/uL (ref 0.0–0.5)
Eosinophils Relative: 6 %
HCT: 26.8 % — ABNORMAL LOW (ref 36.0–46.0)
Hemoglobin: 8.8 g/dL — ABNORMAL LOW (ref 12.0–15.0)
Immature Granulocytes: 1 %
Lymphocytes Relative: 4 %
Lymphs Abs: 0.2 10*3/uL — ABNORMAL LOW (ref 0.7–4.0)
MCH: 30.8 pg (ref 26.0–34.0)
MCHC: 32.8 g/dL (ref 30.0–36.0)
MCV: 93.7 fL (ref 80.0–100.0)
Monocytes Absolute: 0.5 10*3/uL (ref 0.1–1.0)
Monocytes Relative: 12 %
Neutro Abs: 3.4 10*3/uL (ref 1.7–7.7)
Neutrophils Relative %: 76 %
Platelets: 302 10*3/uL (ref 150–400)
RBC: 2.86 MIL/uL — ABNORMAL LOW (ref 3.87–5.11)
RDW: 17 % — ABNORMAL HIGH (ref 11.5–15.5)
WBC: 4.3 10*3/uL (ref 4.0–10.5)
nRBC: 0 % (ref 0.0–0.2)

## 2019-06-28 LAB — BASIC METABOLIC PANEL
Anion gap: 9 (ref 5–15)
BUN: 18 mg/dL (ref 6–20)
CO2: 25 mmol/L (ref 22–32)
Calcium: 9.1 mg/dL (ref 8.9–10.3)
Chloride: 103 mmol/L (ref 98–111)
Creatinine, Ser: 0.84 mg/dL (ref 0.44–1.00)
GFR calc Af Amer: >60 mL/min (ref >60)
GFR calc non Af Amer: >60 mL/min (ref >60)
Glucose, Bld: 100 mg/dL — ABNORMAL HIGH (ref 70–99)
Potassium: 3.9 mmol/L (ref 3.5–5.1)
Sodium: 137 mmol/L (ref 135–145)

## 2019-06-28 LAB — SAMPLE TO BLOOD BANK

## 2019-06-28 MED ORDER — HEPARIN SOD (PORK) LOCK FLUSH 100 UNIT/ML IV SOLN
500.0000 [IU] | Freq: Once | INTRAVENOUS | Status: AC
  Administered 2019-06-28: 15:00:00 500 [IU] via INTRAVENOUS
  Filled 2019-06-28: qty 5

## 2019-06-28 MED ORDER — SODIUM CHLORIDE 0.9% FLUSH
10.0000 mL | Freq: Once | INTRAVENOUS | Status: AC
  Administered 2019-06-28: 09:00:00 10 mL via INTRAVENOUS
  Filled 2019-06-28: qty 10

## 2019-06-28 MED ORDER — POTASSIUM CHLORIDE 2 MEQ/ML IV SOLN
Freq: Once | INTRAVENOUS | Status: AC
  Administered 2019-06-28: 10:00:00 via INTRAVENOUS
  Filled 2019-06-28: qty 1000

## 2019-06-28 MED ORDER — SODIUM CHLORIDE 0.9 % IV SOLN
Freq: Once | INTRAVENOUS | Status: AC
  Administered 2019-06-28: 10:00:00 via INTRAVENOUS
  Filled 2019-06-28: qty 250

## 2019-06-28 MED ORDER — SODIUM CHLORIDE 0.9 % IV SOLN
Freq: Once | INTRAVENOUS | Status: AC
  Administered 2019-06-28: 12:00:00 via INTRAVENOUS
  Filled 2019-06-28: qty 5

## 2019-06-28 MED ORDER — SODIUM CHLORIDE 0.9 % IV SOLN
40.0000 mg/m2 | Freq: Once | INTRAVENOUS | Status: AC
  Administered 2019-06-28: 13:00:00 61 mg via INTRAVENOUS
  Filled 2019-06-28: qty 61

## 2019-06-28 MED ORDER — PALONOSETRON HCL INJECTION 0.25 MG/5ML
0.2500 mg | Freq: Once | INTRAVENOUS | Status: AC
  Administered 2019-06-28: 12:00:00 0.25 mg via INTRAVENOUS
  Filled 2019-06-28: qty 5

## 2019-06-28 NOTE — Progress Notes (Signed)
Pt reports no concerns today, here for follow up. States she has started bleeding again beginning on last Friday, says it happens "off and on".

## 2019-06-29 ENCOUNTER — Other Ambulatory Visit: Payer: Self-pay

## 2019-06-29 ENCOUNTER — Ambulatory Visit
Admission: RE | Admit: 2019-06-29 | Discharge: 2019-06-29 | Disposition: A | Discharge status: Home / Self Care | Payer: BC Managed Care – PPO | Source: Ambulatory Visit | Attending: Radiation Oncology | Admitting: Radiation Oncology

## 2019-06-29 ENCOUNTER — Ambulatory Visit: Payer: BC Managed Care – PPO

## 2019-06-29 DIAGNOSIS — C772 Secondary and unspecified malignant neoplasm of intra-abdominal lymph nodes: Secondary | ICD-10-CM | POA: Diagnosis not present

## 2019-06-29 DIAGNOSIS — D649 Anemia, unspecified: Secondary | ICD-10-CM | POA: Diagnosis not present

## 2019-06-29 DIAGNOSIS — N289 Disorder of kidney and ureter, unspecified: Secondary | ICD-10-CM | POA: Diagnosis not present

## 2019-06-29 DIAGNOSIS — C530 Malignant neoplasm of endocervix: Secondary | ICD-10-CM | POA: Diagnosis not present

## 2019-06-29 DIAGNOSIS — Z5111 Encounter for antineoplastic chemotherapy: Secondary | ICD-10-CM | POA: Diagnosis not present

## 2019-06-29 DIAGNOSIS — Z510 Encounter for antineoplastic radiation therapy: Secondary | ICD-10-CM | POA: Diagnosis not present

## 2019-06-29 DIAGNOSIS — C539 Malignant neoplasm of cervix uteri, unspecified: Secondary | ICD-10-CM | POA: Diagnosis not present

## 2019-07-02 ENCOUNTER — Ambulatory Visit
Admission: RE | Admit: 2019-07-02 | Discharge: 2019-07-02 | Disposition: A | Discharge status: Home / Self Care | Payer: BC Managed Care – PPO | Source: Ambulatory Visit | Attending: Radiation Oncology | Admitting: Radiation Oncology

## 2019-07-02 ENCOUNTER — Ambulatory Visit: Payer: BC Managed Care – PPO

## 2019-07-02 ENCOUNTER — Other Ambulatory Visit: Payer: Self-pay

## 2019-07-02 DIAGNOSIS — C772 Secondary and unspecified malignant neoplasm of intra-abdominal lymph nodes: Secondary | ICD-10-CM | POA: Diagnosis not present

## 2019-07-02 DIAGNOSIS — C530 Malignant neoplasm of endocervix: Secondary | ICD-10-CM | POA: Diagnosis not present

## 2019-07-02 DIAGNOSIS — N289 Disorder of kidney and ureter, unspecified: Secondary | ICD-10-CM | POA: Diagnosis not present

## 2019-07-02 DIAGNOSIS — Z510 Encounter for antineoplastic radiation therapy: Secondary | ICD-10-CM | POA: Diagnosis not present

## 2019-07-02 DIAGNOSIS — Z5111 Encounter for antineoplastic chemotherapy: Secondary | ICD-10-CM | POA: Diagnosis not present

## 2019-07-02 DIAGNOSIS — C539 Malignant neoplasm of cervix uteri, unspecified: Secondary | ICD-10-CM | POA: Diagnosis not present

## 2019-07-02 DIAGNOSIS — D649 Anemia, unspecified: Secondary | ICD-10-CM | POA: Diagnosis not present

## 2019-07-03 ENCOUNTER — Other Ambulatory Visit: Payer: Self-pay

## 2019-07-03 ENCOUNTER — Ambulatory Visit
Admission: RE | Admit: 2019-07-03 | Discharge: 2019-07-03 | Disposition: A | Discharge status: Home / Self Care | Payer: BC Managed Care – PPO | Source: Ambulatory Visit | Attending: Radiation Oncology | Admitting: Radiation Oncology

## 2019-07-03 ENCOUNTER — Ambulatory Visit: Payer: BC Managed Care – PPO

## 2019-07-03 DIAGNOSIS — C772 Secondary and unspecified malignant neoplasm of intra-abdominal lymph nodes: Secondary | ICD-10-CM | POA: Diagnosis not present

## 2019-07-03 DIAGNOSIS — C539 Malignant neoplasm of cervix uteri, unspecified: Secondary | ICD-10-CM | POA: Diagnosis not present

## 2019-07-03 DIAGNOSIS — Z510 Encounter for antineoplastic radiation therapy: Secondary | ICD-10-CM | POA: Diagnosis not present

## 2019-07-03 DIAGNOSIS — D649 Anemia, unspecified: Secondary | ICD-10-CM | POA: Diagnosis not present

## 2019-07-03 DIAGNOSIS — Z5111 Encounter for antineoplastic chemotherapy: Secondary | ICD-10-CM | POA: Diagnosis not present

## 2019-07-03 DIAGNOSIS — N289 Disorder of kidney and ureter, unspecified: Secondary | ICD-10-CM | POA: Diagnosis not present

## 2019-07-03 DIAGNOSIS — C530 Malignant neoplasm of endocervix: Secondary | ICD-10-CM | POA: Diagnosis not present

## 2019-07-04 ENCOUNTER — Ambulatory Visit: Payer: BC Managed Care – PPO

## 2019-07-04 ENCOUNTER — Other Ambulatory Visit: Payer: Self-pay

## 2019-07-04 ENCOUNTER — Encounter: Payer: Self-pay | Admitting: Oncology

## 2019-07-04 DIAGNOSIS — C539 Malignant neoplasm of cervix uteri, unspecified: Secondary | ICD-10-CM

## 2019-07-04 MED ORDER — ALPRAZOLAM 0.25 MG PO TABS: 0.2500 mg | ORAL_TABLET | Freq: Every evening | ORAL | 0 refills | Status: DC | PRN

## 2019-07-04 MED ORDER — APIXABAN 5 MG PO TABS: 5.0000 mg | ORAL_TABLET | Freq: Two times a day (BID) | ORAL | 3 refills | Status: DC

## 2019-07-05 ENCOUNTER — Ambulatory Visit: Payer: BC Managed Care – PPO

## 2019-07-06 ENCOUNTER — Ambulatory Visit: Payer: BC Managed Care – PPO

## 2019-07-09 ENCOUNTER — Ambulatory Visit: Payer: BC Managed Care – PPO

## 2019-07-10 ENCOUNTER — Other Ambulatory Visit: Payer: Self-pay

## 2019-07-10 ENCOUNTER — Ambulatory Visit: Payer: BC Managed Care – PPO

## 2019-07-10 ENCOUNTER — Encounter
Admission: RE | Admit: 2019-07-10 | Discharge: 2019-07-10 | Disposition: A | Discharge status: Home / Self Care | Payer: BC Managed Care – PPO | Source: Ambulatory Visit | Attending: Oncology | Admitting: Oncology

## 2019-07-10 DIAGNOSIS — C539 Malignant neoplasm of cervix uteri, unspecified: Secondary | ICD-10-CM | POA: Insufficient documentation

## 2019-07-10 LAB — GLUCOSE, CAPILLARY: Glucose-Capillary: 102 mg/dL — ABNORMAL HIGH (ref 70–99)

## 2019-07-10 MED ORDER — FLUDEOXYGLUCOSE F - 18 (FDG) INJECTION
5.6000 | Freq: Once | INTRAVENOUS | Status: AC | PRN
  Administered 2019-07-10: 5.81 via INTRAVENOUS

## 2019-07-11 ENCOUNTER — Ambulatory Visit: Payer: BC Managed Care – PPO

## 2019-07-11 ENCOUNTER — Other Ambulatory Visit: Payer: Self-pay

## 2019-07-11 ENCOUNTER — Inpatient Hospital Stay: Payer: BC Managed Care – PPO | Attending: Obstetrics and Gynecology | Admitting: Obstetrics and Gynecology

## 2019-07-11 ENCOUNTER — Encounter: Payer: Self-pay | Admitting: Obstetrics and Gynecology

## 2019-07-11 VITALS — BP 129/88 | HR 94 | Temp 98.9°F | Resp 18 | Wt 113.8 lb

## 2019-07-11 DIAGNOSIS — C539 Malignant neoplasm of cervix uteri, unspecified: Secondary | ICD-10-CM | POA: Diagnosis not present

## 2019-07-11 DIAGNOSIS — C530 Malignant neoplasm of endocervix: Secondary | ICD-10-CM | POA: Diagnosis not present

## 2019-07-11 DIAGNOSIS — C772 Secondary and unspecified malignant neoplasm of intra-abdominal lymph nodes: Secondary | ICD-10-CM | POA: Insufficient documentation

## 2019-07-11 DIAGNOSIS — Z5111 Encounter for antineoplastic chemotherapy: Secondary | ICD-10-CM | POA: Diagnosis not present

## 2019-07-11 DIAGNOSIS — F17210 Nicotine dependence, cigarettes, uncomplicated: Secondary | ICD-10-CM | POA: Insufficient documentation

## 2019-07-11 DIAGNOSIS — Z7901 Long term (current) use of anticoagulants: Secondary | ICD-10-CM | POA: Insufficient documentation

## 2019-07-11 DIAGNOSIS — C786 Secondary malignant neoplasm of retroperitoneum and peritoneum: Secondary | ICD-10-CM | POA: Diagnosis not present

## 2019-07-11 DIAGNOSIS — Z923 Personal history of irradiation: Secondary | ICD-10-CM | POA: Diagnosis not present

## 2019-07-11 DIAGNOSIS — Z9221 Personal history of antineoplastic chemotherapy: Secondary | ICD-10-CM | POA: Insufficient documentation

## 2019-07-11 DIAGNOSIS — D509 Iron deficiency anemia, unspecified: Secondary | ICD-10-CM | POA: Diagnosis not present

## 2019-07-11 DIAGNOSIS — Z79899 Other long term (current) drug therapy: Secondary | ICD-10-CM | POA: Diagnosis not present

## 2019-07-11 DIAGNOSIS — Z86718 Personal history of other venous thrombosis and embolism: Secondary | ICD-10-CM | POA: Insufficient documentation

## 2019-07-11 NOTE — Progress Notes (Signed)
Gynecologic Oncology Interval Visit   Referring Provider: Dr. Georgianne Fick  Chief Complaint: Stage IIIb cervical cancer  Subjective:  Mariah Mcmahon is a 53 y.o. G68P0011 female, initially seen in consultation from Dr. Ned Card for malignant neoplasm of endocervix, who returns to clinic today for re-evaluation.   She received concurrent weekly cisplatin (40 mg/m2) 6 cycles (05/17/2019-06/28/2019) with Dr. Grayland Ormond and radiation to the pelvis and PA nodes (05/23/2019 - 07/03/2019) with Dr. Baruch Gouty.   PET on 07/10/2019 IMPRESSION: 1. Mixed treatment response. 2. New hypermetabolic left supraclavicular, high left paratracheal, low right paratracheal and subcarinal lymphadenopathy, compatible with progression of metastatic nodal disease in the chest and left lower neck. 3. Substantially decreased hypermetabolism within retroperitoneal nodal metastases. Resolved hypermetabolism within retrocrural and pelvic nodal metastases. 4. Near complete resolution of hypermetabolism within the cervix and uterus. Resolved hypermetabolism within anterior low left pelvic peritoneal metastasis. 5. No growth of tiny pulmonary nodules, which are below PET resolution. Stable mixed lytic and sclerotic left sacral alla relation without significant metabolic uptake.  Treatment course was complicated by left lower extremity extensive acute occlusive DVT extending from left common femoral vein through left popliteal veins.  CTA on 06/05/2019 for acute shortness of breath revealed several small peripheral left lower lobe pulmonary emboli.  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 IVC filter was placed.  Given that there was a mechanical indication for the cause of her clot, Dr. Delana Meyer said we could consider stopping her anticoagulation after she has been treated for her pulmonary  embolism but deferred to Dr. Grayland Ormond.  Plan to remove IVC filter on 08/07/2019 with Dr. Delana Meyer.   She has not yet had PDL 1 testing due to insufficient tissue.   Complains of much less abdominal pain, but still some bloating. No neuropathy.   Gynecologic Oncology History: Initially presented to PCP for abnormal uterine bleeding, pelvic and back pain.  Lumbar spine x-ray was normal.  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  She reported heavy and painful irregular bleeding.  Pain is present outside of menses.  She is a smoker with 15-pack-year history.  Denies history of abnormal Paps but prior results not available for review.  She presented to Dr. Georgianne Fick on 04/24/2019.  On exam, barrel-shaped with mass-effect of left portion of cervix, cervix grossly enlarged, friable with contact bleeding colposcopy was performed.  Urine pregnancy test was negative.    Diagnosis: 1.  Cervix, biopsy, 12:00 -High-grade squamous intraepithelial lesion, CIN-3 2.  Cervix, biopsy, 3:00 -High-grade squamous intraepithelial lesion, CIN-3 3.  Cervix, biopsy, 5:00 -High-grade squamous intraepithelial lesion, CIN-3 with foci suspicious for early stromal microinvasion 4.  Endocervix, curettage -High-grade squamous intraepithelial lesion, CIN-3  HIV screening-non-reactive (04/09/2019)  05/02/2019- CT C/A/P 1. Heterogeneous enlargement of the uterus and cervix with apparent soft tissue thickening in the upper vagina. Cervical and vaginal tissues not well evaluated by CT. 2. Bulky retroperitoneal lymphadenopathy in the abdomen with bilateral common iliac and pelvic sidewall lymphadenopathy in the pelvis. Imaging features consistent with metastatic disease. 3. 5 cm soft tissue lesion to the left of the bladder is consistent with a metastatic deposit. 4. Mild to moderate right hydroureteronephrosis with decreased perfusion to the right kidney. Right ureteral obstruction is at the level of the right  pelvic sidewall. 5. 2.2 cm mixed lytic and lucent lesion in the left sacrum, indeterminate, but metastatic  disease not excluded.  6. Several 3-4 mm nodules identified in the lungs. Close attention on follow-up recommended as metastatic disease not excluded.  7. Small volume ascites.   Problem List: Patient Active Problem List   Diagnosis Date Noted  . DVT of lower limb, acute ([place]) 06/05/2019  . Iron deficiency anemia due to chronic blood loss 05/24/2019  . Goals of care, counseling/discussion 05/12/2019  . Cervical cancer ([place]) 05/04/2019  . Generalized abdominal pain 04/09/2019  . Acute midline low back pain without sciatica 04/09/2019  . Nicotine dependence, cigarettes, uncomplicated 64/40/3474    Past Medical History: Past Medical History:  Diagnosis Date  . Cancer ([place])    cwervical cancer  . No pertinent past medical history     Past Surgical History: Past Surgical History:  Procedure Laterality Date  . MASS EXCISION     Throat  . PERIPHERAL VASCULAR THROMBECTOMY Left 06/05/2019   Procedure: PERIPHERAL VASCULAR THROMBECTOMY WITH IVC FILTER (LEFT LOWER EXTREMITY);  Surgeon: Katha Cabal, MD;  Location: [facility];  Service: Cardiovascular;  Laterality: Left;  . PORTA CATH INSERTION N/A 06/06/2019   Procedure: PORTA CATH INSERTION;  Surgeon: Katha Cabal, MD;  Location: [facility];  Service: Cardiovascular;  Laterality: N/A;  . TONSILLECTOMY     Past Gynecologic History:  History of abnormal Paps: Per HPI Contraception: Sexually active: Not currently Menarche: 8th grade Details: 5 days History of OCP/HRT use:  OB History:  OB History  Gravida Para Term Preterm AB Living  _0 SAB TAB Ectopic Multiple Live Births               # Outcome Date GA Lbr Len/2nd Weight Sex Delivery Anes PTL Lv  2 AB           1 Para            Family History: Family History  Problem Relation Age of Onset  . Brain cancer Mother 68  .  Alcohol abuse Father   . Heart disease Father   . Prostate cancer Father   . Heart attack Father   . Diabetes Father    Social History: Social History   Socioeconomic History  . Marital status: Divorced    Spouse name: Not on file  . Number of children: Not on file  . Years of education: Not on file  . Highest education level: Not on file  Occupational History  . Occupation: [employer]    Employer: [employer]  . Financial resource strain: Not hard at all  . Food insecurity    Worry: Never true    Inability: Never true  . Transportation needs    Medical: No    Non-medical: No  Tobacco Use  . Smoking status: Current Every Day Smoker    Packs/day: 0.50    Years: 30.00    Pack years: 15.00    Types: Cigarettes  . Smokeless tobacco: Never Used  Substance and Sexual Activity  . Alcohol use: Not Currently  . Drug use: Never  . Sexual activity: Yes    Comment: not at moment  Lifestyle  . Physical activity    Days per week: 6 days    Minutes per session: 40 min  . Stress: Not at all  Relationships  . Social connections    Talks on phone: More than three times a week    Gets together: More than three times a  week    Attends religious service: Never    Active member of club or organization: No    Attends meetings of clubs or organizations: Never    Relationship status: Divorced  . Intimate partner violence    Fear of current or ex partner: No    Emotionally abused: No    Physically abused: No    Forced sexual activity: No  Other Topics Concern  . Not on file  Social History Narrative  . Not on file   Allergies: No Known Allergies  Current Medications: Current Outpatient Medications  Medication Sig Dispense Refill  . acetaminophen (TYLENOL) 500 MG tablet Take 500 mg by mouth every 6 (six) hours as needed.    . ALPRAZolam (XANAX) 0.25 MG tablet Take 1 tablet (0.25 mg total) by mouth at bedtime as needed for anxiety. 30 tablet 0  .  apixaban (ELIQUIS) 5 MG TABS tablet Take 1 tablet (5 mg total) by mouth 2 (two) times daily. 60 tablet 3  . cyclobenzaprine (FLEXERIL) 10 MG tablet Take 1 tablet by mouth 1 day or 1 dose.    . Eliquis DVT/PE Starter Pack (ELIQUIS STARTER PACK) 5 MG TABS Take as directed on package: start with two-58m tablets twice daily for 7 days. On day 8, switch to one-519mtablet twice daily. 1 each 0  . methocarbamol (ROBAXIN) 500 MG tablet Take 1 tablet (500 mg total) by mouth 2 (two) times daily as needed for muscle spasms (for lower back pain). 120 tablet 1  . ondansetron (ZOFRAN) 8 MG tablet Take 1 tablet (8 mg total) by mouth 2 (two) times daily as needed. Start on the third day after chemotherapy. 30 tablet 2   No current facility-administered medications for this visit.     Review of Systems General:  Fatigue & weakness: grade 5 Skin: no complaints Eyes: no complaints HEENT: no complaints Breasts: no complaints Pulmonary: no complaints Cardiac: no complaints Gastrointestinal: abdominal pain, bloating/distention, decreased appetite, nausea/vomiting Genitourinary/Sexual: no complaints Ob/Gyn: vaginal discharge: grade 1 Musculoskeletal: back pain: grade 5 Hematology: no complaints Neurologic/Psych: no complaints   Objective:  Physical Examination:  Today's Vitals   07/11/19 1314  BP: 129/88  Pulse: 94  Resp: 18  Temp: 98.9 F (37.2 C)  TempSrc: Tympanic  Weight: 113 lb 12.8 oz (51.6 kg)  PainSc: 5    Body mass index is 20.81 kg/m.  BP 129/88 (BP Location: Left Arm, Patient Position: Sitting)   Pulse 94   Temp 98.9 F (37.2 C) (Tympanic)   Resp 18   Wt 113 lb 12.8 oz (51.6 kg)   BMI 20.81 kg/m     ECOG Performance Status: 1 - Symptomatic but completely ambulatory  GENERAL: Patient is a well appearing female in no acute distress HEENT:  Sclerae anicteric.  Oropharynx clear and moist. Neck is supple. NODES:  No cervical, supraclavicular, or axillary lymphadenopathy palpated.   Fullness of left side of neck; no distinct nodularity palpated nontender.  LUNGS:  Clear to auscultation bilaterally.  No wheezes or rhonchi. HEART:  Regular rate and rhythm. No murmur appreciated. ABDOMEN:  Soft, nontender.  Positive, normoactive bowel sounds. No organomegaly palpated. MSK:  No focal spinal tenderness to palpation.  EXTREMITIES:  No peripheral edema.   SKIN:  Clear with no obvious rashes or skin changes.  NEURO:  Nonfocal. Well oriented.  Appropriate affect.  Pelvic: Exam Chaperoned by NP EGBUS: no lesions Cervix: Normal size and somewhat firm. .  Vagina: Blood in vault. No lesions, no discharge  Uterus: normal.   Bimanual/RV: no masses, parametria smooth Rectovaginal: confirmatory  Lab Review No labs on site today  Radiologic Imaging: Imaging independently reviewed by Dr. Fransisca Connors.  He agrees with findings.  Results discussed in detail with patient. - PET 07/11/2019    Assessment:  Mariah Mcmahon is a 53 y.o. 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  There was a 2.2 cm mixed lytic and lucent lesion in the left sacrum, indeterminate, but metastatic disease not excluded.  Several 3-4 mm nodules identified in the lungs.  Right hydronephrosis noted with some decreased function.  Underwent 5 weeks of weekly cisplatin with external radiation to the pelvis and PA nodes.    Restaging CT scan shows resolution of primary cervical cancer and of nodal disease in the pelvis and abdomen.  However, new PET positive adenopathy in chest and neck, all less then 1 cm. 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    Medical co-morbidities complicating care: DVT of lower  extremity, Iron Deficiency Anemia, Tobacco Use Plan:   Problem List Items Addressed This Visit      Genitourinary   Cervical cancer ([place]) - Primary     I discussed the course to date with Dr Christel Mormon, our Gyn radiation oncologist at [facility] to see if he thought it would be an option to treat the new chest and neck adenopathy with radiation while proceeding with cervical bracytherapy.  After review of the images and after discussion, we feel it would be best to treat her with carboplatin and taxol for three cycles now in view of good local control and high risk of other distant disease that does not show up on PET now. Would probably hold Avastin for now in view of VTE, but Dr Grayland Ormond in Medical Oncology can weigh in on this. We will  reassess with PET CT after 3 cycles.    We discussed that her prognosis is still poor even though the primary and regional nodes have responded well to treatment.  That said, our goal is to pursue the best available treatment and hope for the best.   IVC filter to come out later this month.   Another biopsy will be needed at some point for moleculare testing for TMB, MSI, PDL1 to determine if pembrolizumab would be an option.   She will see Dr Grayland Ormond next week to start carbo/taxol chemotherapy. Last weekly cisplatin was 06/28/19.     Mellody Drown, MD  CC:  Venita Lick, NP [address] [place],  [place] [address] [phone]

## 2019-07-11 NOTE — Progress Notes (Signed)
Pt in for follow up, reports had an episode of light headedness and passed out for a few seconds on Saturday.  Reports still having breakthrough pain in vaginal area with discharge clear to yellow at times.

## 2019-07-12 ENCOUNTER — Ambulatory Visit: Payer: BC Managed Care – PPO

## 2019-07-13 ENCOUNTER — Telehealth: Payer: Self-pay

## 2019-07-13 ENCOUNTER — Ambulatory Visit: Payer: BC Managed Care – PPO

## 2019-07-13 ENCOUNTER — Inpatient Hospital Stay: Payer: BC Managed Care – PPO | Admitting: Hospice and Palliative Medicine

## 2019-07-13 ENCOUNTER — Other Ambulatory Visit: Payer: Self-pay | Admitting: Oncology

## 2019-07-13 DIAGNOSIS — C539 Malignant neoplasm of cervix uteri, unspecified: Secondary | ICD-10-CM

## 2019-07-13 NOTE — Progress Notes (Signed)
DISCONTINUE OFF PATHWAY REGIMEN - Uterine   [id number]:Cisplatin 40 mg/m2 IV D1 q7 Days + RT:   A cycle is every 7 days:     Cisplatin   **Always confirm dose/schedule in your pharmacy ordering system**  REASON: Other Reason PRIOR TREATMENT: Off Pathway: Cisplatin 40 mg/m2 IV D1 q7 Days + RT TREATMENT RESPONSE: Partial Response (PR)  START OFF PATHWAY REGIMEN - Uterine   [id number]:Bevacizumab 15 mg/kg IV D1 + Carboplatin AUC=5 IV D1 + Paclitaxel 175 mg/m2 IV D1 q21 Days:   A cycle is every 21 days:     Bevacizumab-xxxx      Paclitaxel      Carboplatin   **Always confirm dose/schedule in your pharmacy ordering system**  Patient Characteristics: Endometrioid Histology, Recurrent/Progressive Disease, Second Line, Persistent Disease Post First Line Chemotherapy for Metastatic Disease, < 6 Months Since Previous Chemotherapy Histology: Endometrioid Histology Therapeutic Status: Recurrent or Progressive Disease AJCC T Category: T3a AJCC N Category: N2a AJCC M Category: M1 AJCC 8 Stage Grouping: IVB Line of Therapy: Second Line Time to Recurrence: < 6 Months Since Previous Chemotherapy Intent of Therapy: Non-Curative / Palliative Intent, Discussed with Patient

## 2019-07-13 NOTE — Progress Notes (Signed)
[place]  [phone] [phone]) [phone]  ID: Mariah Mcmahon OB: 1966-02-02  MR#: [id number]  [id number]  Patient Care Team: Venita Lick, NP as PCP - General (Nurse Practitioner) Clent Jacks, RN as Oncology Nurse Navigator  CHIEF COMPLAINT: Stage IV cervical cancer  INTERVAL HISTORY: Patient returns to clinic today for further evaluation and to initiate systemic treatment with carboplatinum and Taxol.  She is anxious, but otherwise feels well.  She does not complain of any vaginal bleeding.  She denies any abdominal pain or bloating.  She has no neurologic complaints.  She denies any recent fevers or illnesses.  She has a good appetite and denies weight loss.  She has no chest pain, shortness of breath, cough, or hemoptysis.  She denies any nausea, vomiting, constipation, or diarrhea.  She has no urinary complaints.  Patient offers no further specific complaints today.  REVIEW OF SYSTEMS:   Review of Systems  Constitutional: Negative.  Negative for fever, malaise/fatigue and weight loss.  Respiratory: Negative.  Negative for cough and shortness of breath.   Cardiovascular: Negative.  Negative for chest pain and leg swelling.  Gastrointestinal: Negative for abdominal pain.  Genitourinary: Negative.  Negative for hematuria.  Musculoskeletal: Negative.  Negative for back pain.  Skin: Negative.  Negative for rash.  Neurological: Negative.  Negative for dizziness, focal weakness, weakness and headaches.  Psychiatric/Behavioral: The patient is nervous/anxious.     As per HPI. Otherwise, a complete review of systems is negative.  PAST MEDICAL HISTORY: Past Medical History:  Diagnosis Date   Cancer ([place])    cwervical cancer   No pertinent past medical history     PAST SURGICAL HISTORY: Past Surgical History:  Procedure Laterality Date   MASS EXCISION     Throat   PERIPHERAL VASCULAR THROMBECTOMY Left 06/05/2019   Procedure:  PERIPHERAL VASCULAR THROMBECTOMY WITH IVC FILTER (LEFT LOWER EXTREMITY);  Surgeon: Katha Cabal, MD;  Location[facility];  Service: Cardiovascular;  Laterality: Left;   PORTA CATH INSERTION N/A 06/06/2019   Procedure: PORTA CATH INSERTION;  Surgeon: Katha Cabal, MD;  Location: [facility];  Service: Cardiovascular;  Laterality: N/A;   TONSILLECTOMY      FAMILY HISTORY: Family History  Problem Relation Age of Onset   Brain cancer Mother 59   Alcohol abuse Father    Heart disease Father    Prostate cancer Father    Heart attack Father    Diabetes Father     ADVANCED DIRECTIVES (Y/N):  N  HEALTH MAINTENANCE: Social History   Tobacco Use   Smoking status: Current Every Day Smoker    Packs/day: 0.50    Years: 30.00    Pack years: 15.00    Types: Cigarettes   Smokeless tobacco: Never Used  Substance Use Topics   Alcohol use: Not Currently   Drug use: Never     Colonoscopy:  PAP:  Bone density:  Lipid panel:  No Known Allergies  Current Outpatient Medications  Medication Sig Dispense Refill   acetaminophen (TYLENOL) 500 MG tablet Take 500 mg by mouth every 6 (six) hours as needed.     ALPRAZolam (XANAX) 0.25 MG tablet Take 1 tablet (0.25 mg total) by mouth at bedtime as needed for anxiety. 30 tablet 0   apixaban (ELIQUIS) 5 MG TABS tablet Take 1 tablet (5 mg total) by mouth 2 (two) times daily. 60 tablet 3   cyclobenzaprine (FLEXERIL) 10 MG tablet Take 1 tablet by  mouth 1 day or 1 dose.     methocarbamol (ROBAXIN) 500 MG tablet Take 1 tablet (500 mg total) by mouth 2 (two) times daily as needed for muscle spasms (for lower back pain). 120 tablet 1   No current facility-administered medications for this visit.     OBJECTIVE: Vitals:   07/20/19 0924  BP: 119/80  Pulse: 89  Resp: 18  Temp: (!) 96.3 F (35.7 C)     Body mass index is 21.45 kg/m.    ECOG FS:0 - Asymptomatic  General: Thin, no acute distress. Eyes:  Pink conjunctiva, anicteric sclera. HEENT: Normocephalic, moist mucous membranes. Lungs: Clear to auscultation bilaterally. Heart: Regular rate and rhythm. No rubs, murmurs, or gallops. Abdomen: Soft, nontender, nondistended. No organomegaly noted, normoactive bowel sounds. Musculoskeletal: No edema, cyanosis, or clubbing. Neuro: Alert, answering all questions appropriately. Cranial nerves grossly intact. Skin: No rashes or petechiae noted. Psych: Normal affect.  LAB RESULTS:  Lab Results  Component Value Date   NA 137 07/20/2019   K 4.1 07/20/2019   CL 102 07/20/2019   CO2 26 07/20/2019   GLUCOSE 100 (H) 07/20/2019   BUN 24 (H) 07/20/2019   CREATININE 0.90 07/20/2019   CALCIUM 9.4 07/20/2019   PROT 7.0 07/20/2019   ALBUMIN 3.9 07/20/2019   AST 38 07/20/2019   ALT 24 07/20/2019   ALKPHOS 96 07/20/2019   BILITOT 0.5 07/20/2019   GFRNONAA >60 07/20/2019   GFRAA >60 07/20/2019    Lab Results  Component Value Date   WBC 2.9 (L) 07/20/2019   NEUTROABS 1.4 (L) 07/20/2019   HGB 9.1 (L) 07/20/2019   HCT 28.0 (L) 07/20/2019   MCV 99.6 07/20/2019   PLT 252 07/20/2019     STUDIES: Nm Pet Image Restag (ps) Skull Base To Thigh  Result Date: 07/10/2019 CLINICAL DATA:  Subsequent treatment strategy for stage IIIB cervical cancer. Completed radiation therapy 07/03/2019. Concurrent chemotherapy. EXAM: NUCLEAR MEDICINE PET SKULL BASE TO THIGH TECHNIQUE: 5.8 mCi F-18 FDG was injected intravenously. Full-ring PET imaging was performed from the skull base to thigh after the radiotracer. CT data was obtained and used for attenuation correction and anatomic localization. Fasting blood glucose: 102 mg/dl COMPARISON:  05/07/2019 PET-CT. FINDINGS: Mediastinal blood pool activity: SUV max 2.0 Liver activity: SUV max NA NECK: There are multiple new mildly enlarged hypermetabolic left supraclavicular lymph nodes, largest 1.0 cm with max SUV 8.0 (series 3/image 50). Incidental CT findings: None.  CHEST: Newly hypermetabolic high left paratracheal 0.7 cm lymph node with max SUV 4.7 (series 3/image 64). Newly hypermetabolic 0.7 cm subcarinal node with max SUV 6.0 (series 3/image 70). Newly hypermetabolic 0.7 cm low right paratracheal node with max SUV 3.4 (series 3/image 76). No hypermetabolic axillary or hilar lymph nodes. No hypermetabolic pulmonary findings. Incidental CT findings: Right internal jugular Port-A-Cath terminates at the cavoatrial junction. Low cardiac blood pool density indicates anemia. Right upper lobe 3 mm solid pulmonary nodule (series 3/image 83), stable. Medial basilar left lower lobe 4 mm pulmonary nodule seen on prior PET-CT is not appreciated on today's scan, potentially obscured by atelectasis. No acute consolidative airspace disease or new significant pulmonary nodules. ABDOMEN/PELVIS: Previously visualized hypermetabolism within bilateral retrocrural lymph nodes and bilateral iliac lymph nodes has resolved. Substantially decreased hypermetabolism within retroperitoneal lymph nodes involving the aortocaval and left para-aortic chains. Residual mild hypermetabolism within a 1.6 cm left para-aortic node with max SUV 3.1 (series 3/image 140), previously 2.9 cm with max SUV 12.8. Residual mild hypermetabolism within a 0.9 cm  aortocaval node with max SUV 2.9 (series 3/image 142), previously 2.1 cm with max SUV 9.9. No residual hypermetabolism within the previously noted 6.1 x 3.4 cm soft tissue implant in the anterior left pelvis, now measuring 0.7 x 0.6 cm (series 3/image 211). Nearly resolved hypermetabolism within cervix and uterus, with tiny residual focus of hypermetabolism in the posterior upper cervix with max SUV 4.7, previous max SUV 19.1. No abnormal hypermetabolic activity within the liver, pancreas, adrenal glands, or spleen. Incidental CT findings: Infrarenal IVC filter and left common iliac vein stent are in place. SKELETON: No focal hypermetabolic activity to suggest  skeletal metastasis. Incidental CT findings: Stable mixed lytic and sclerotic left sacral ala or lesion without significant metabolic uptake. IMPRESSION: 1. Mixed treatment response. 2. New hypermetabolic left supraclavicular, high left paratracheal, low right paratracheal and subcarinal lymphadenopathy, compatible with progression of metastatic nodal disease in the chest and left lower neck. 3. Substantially decreased hypermetabolism within retroperitoneal nodal metastases. Resolved hypermetabolism within retrocrural and pelvic nodal metastases. 4. Near complete resolution of hypermetabolism within the cervix and uterus. Resolved hypermetabolism within anterior low left pelvic peritoneal metastasis. 5. No growth of tiny pulmonary nodules, which are below PET resolution. Electronically Signed   By: Ilona Sorrel M.D.   On: 07/10/2019 17:00    ASSESSMENT: Stage IV cervical cancer.  PLAN:    1.  Stage IV cervical cancer: Biopsy results from April 24, 2019 confirmed the diagnosis. PET scan on July 10, 2019 reviewed independently with persistent metastatic disease.  Patient was also evaluated by gynecology oncology.  She continues to have persistent disease that would be outside the field for brachii therapy.  After further discussion it was decided upon to proceed with systemic chemotherapy using carboplatinum and Taxol every 3 weeks for 4-6 cycles.  Patient's treatment is scheduled for Friday, therefore she will require On-Pro Neulasta support since she will be unable to receive injection over the weekend.  Proceed with cycle 1 of carboplatin and Taxol today.  Return to clinic in 1 week with laboratory work and video assisted telemedicine visit.  Patient will then return to clinic in 3 weeks for further evaluation and consideration of cycle 2.  Plan to reimage at the conclusion of cycle 4.   2.  Hydronephrosis: Seen on CT scan.  Monitor closely. 3.  Venous access: Patient now has had a port placed. 4.   Renal insufficiency: Resolved. 5.  Anemia: Patient's hemoglobin has trended up slightly to 9.1.  Monitor closely with active vaginal bleeding and on Eliquis.   6.  Abdominal pain: Patient does not complain of this today.  Continue hydrocodone as prescribed.  Patient also continues to use meloxicam sparingly. 7.  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  She will require Eliquis for minimum 6 months, but she will likely require extended treatment.  Monitor closely with vaginal bleeding as above.  Patient reports she is having her filter removed in the next several weeks.   Patient expressed understanding and was in agreement with this plan. She also understands that She can call clinic at any time with any questions, concerns, or complaints.   Cancer Staging Cervical cancer [facility]) Staging form: Cervix Uteri, AJCC 8th Edition - Clinical: FIGO Stage IIIB (cT3b, cN1, cM0) - Signed by Lloyd Huger, MD on 05/09/2019   Lloyd Huger, MD   07/20/2019 10:15 AM   Addendum: Patient had a PET scan on July 10, 2019 and was also evaluated by gynecology oncology several days later.  Patient continues to have persistent disease that would be outside the field for brachii therapy.  After further discussion it was decided upon to proceed with systemic chemotherapy using carboplatinum, Taxol, and Avastin every 3 weeks for 4-6 cycles.  Patient's treatment is scheduled for Friday, therefore she will require On Pro Neulasta support since she will be unable to receive injection over the weekend.  Return to clinic on July 20, 2019 initiate cycle 1.   Lloyd Huger, MD 07/20/19 10:15 AM

## 2019-07-13 NOTE — Telephone Encounter (Signed)
Called and updated Mariah Mcmahon with new appointments and chemotherapy. Educated further on carbo/taxol/neulasta until she meets with Dr. Grayland Ormond again on 07/20/19. All questions answered.

## 2019-07-17 ENCOUNTER — Ambulatory Visit: Payer: BC Managed Care – PPO

## 2019-07-18 ENCOUNTER — Ambulatory Visit: Payer: BC Managed Care – PPO

## 2019-07-19 ENCOUNTER — Ambulatory Visit: Payer: BC Managed Care – PPO

## 2019-07-19 ENCOUNTER — Other Ambulatory Visit: Payer: Self-pay

## 2019-07-19 ENCOUNTER — Telehealth: Payer: Self-pay

## 2019-07-19 ENCOUNTER — Encounter: Payer: Self-pay | Admitting: Hospice and Palliative Medicine

## 2019-07-19 NOTE — Progress Notes (Signed)
Patient is coming in for follow up, she mentions she is doing well since having a break from the chemo.

## 2019-07-19 NOTE — Telephone Encounter (Signed)
Per Dr. Grayland Ormond called and instructed Mariah Mcmahon to take Claritin 10mg  (not Claritin-D) starting Friday and to take through Sunday to assist with side effects of Neulasta.

## 2019-07-20 ENCOUNTER — Inpatient Hospital Stay: Payer: BC Managed Care – PPO

## 2019-07-20 ENCOUNTER — Inpatient Hospital Stay (HOSPITAL_BASED_OUTPATIENT_CLINIC_OR_DEPARTMENT_OTHER): Payer: BC Managed Care – PPO | Admitting: Hospice and Palliative Medicine

## 2019-07-20 ENCOUNTER — Ambulatory Visit: Payer: BC Managed Care – PPO | Admitting: Oncology

## 2019-07-20 ENCOUNTER — Encounter: Payer: BC Managed Care – PPO | Admitting: Hospice and Palliative Medicine

## 2019-07-20 ENCOUNTER — Other Ambulatory Visit: Payer: Self-pay

## 2019-07-20 ENCOUNTER — Encounter: Payer: Self-pay | Admitting: Oncology

## 2019-07-20 ENCOUNTER — Other Ambulatory Visit: Payer: BC Managed Care – PPO

## 2019-07-20 ENCOUNTER — Inpatient Hospital Stay (HOSPITAL_BASED_OUTPATIENT_CLINIC_OR_DEPARTMENT_OTHER): Payer: BC Managed Care – PPO | Admitting: Oncology

## 2019-07-20 ENCOUNTER — Ambulatory Visit: Payer: BC Managed Care – PPO

## 2019-07-20 VITALS — BP 116/74 | HR 86 | Resp 20

## 2019-07-20 VITALS — BP 119/80 | HR 89 | Temp 96.3°F | Resp 18 | Wt 117.3 lb

## 2019-07-20 DIAGNOSIS — Z923 Personal history of irradiation: Secondary | ICD-10-CM | POA: Diagnosis not present

## 2019-07-20 DIAGNOSIS — C539 Malignant neoplasm of cervix uteri, unspecified: Secondary | ICD-10-CM

## 2019-07-20 DIAGNOSIS — C772 Secondary and unspecified malignant neoplasm of intra-abdominal lymph nodes: Secondary | ICD-10-CM | POA: Diagnosis not present

## 2019-07-20 DIAGNOSIS — D509 Iron deficiency anemia, unspecified: Secondary | ICD-10-CM | POA: Diagnosis not present

## 2019-07-20 DIAGNOSIS — Z79899 Other long term (current) drug therapy: Secondary | ICD-10-CM | POA: Diagnosis not present

## 2019-07-20 DIAGNOSIS — C786 Secondary malignant neoplasm of retroperitoneum and peritoneum: Secondary | ICD-10-CM | POA: Diagnosis not present

## 2019-07-20 DIAGNOSIS — Z7901 Long term (current) use of anticoagulants: Secondary | ICD-10-CM | POA: Diagnosis not present

## 2019-07-20 DIAGNOSIS — Z9221 Personal history of antineoplastic chemotherapy: Secondary | ICD-10-CM | POA: Diagnosis not present

## 2019-07-20 DIAGNOSIS — Z515 Encounter for palliative care: Secondary | ICD-10-CM

## 2019-07-20 DIAGNOSIS — Z5111 Encounter for antineoplastic chemotherapy: Secondary | ICD-10-CM | POA: Diagnosis not present

## 2019-07-20 DIAGNOSIS — C530 Malignant neoplasm of endocervix: Secondary | ICD-10-CM | POA: Diagnosis not present

## 2019-07-20 DIAGNOSIS — F17210 Nicotine dependence, cigarettes, uncomplicated: Secondary | ICD-10-CM | POA: Diagnosis not present

## 2019-07-20 DIAGNOSIS — Z86718 Personal history of other venous thrombosis and embolism: Secondary | ICD-10-CM | POA: Diagnosis not present

## 2019-07-20 LAB — COMPREHENSIVE METABOLIC PANEL
ALT: 24 U/L (ref 0–44)
AST: 38 U/L (ref 15–41)
Albumin: 3.9 g/dL (ref 3.5–5.0)
Alkaline Phosphatase: 96 U/L (ref 38–126)
Anion gap: 9 (ref 5–15)
BUN: 24 mg/dL — ABNORMAL HIGH (ref 6–20)
CO2: 26 mmol/L (ref 22–32)
Calcium: 9.4 mg/dL (ref 8.9–10.3)
Chloride: 102 mmol/L (ref 98–111)
Creatinine, Ser: 0.9 mg/dL (ref 0.44–1.00)
GFR calc Af Amer: >60 mL/min (ref >60)
GFR calc non Af Amer: >60 mL/min (ref >60)
Glucose, Bld: 100 mg/dL — ABNORMAL HIGH (ref 70–99)
Potassium: 4.1 mmol/L (ref 3.5–5.1)
Sodium: 137 mmol/L (ref 135–145)
Total Bilirubin: 0.5 mg/dL (ref 0.3–1.2)
Total Protein: 7 g/dL (ref 6.5–8.1)

## 2019-07-20 LAB — CBC WITH DIFFERENTIAL/PLATELET
Abs Immature Granulocytes: 0.03 10*3/uL (ref 0.00–0.07)
Basophils Absolute: 0 10*3/uL (ref 0.0–0.1)
Basophils Relative: 1 %
Eosinophils Absolute: 0.1 10*3/uL (ref 0.0–0.5)
Eosinophils Relative: 3 %
HCT: 28 % — ABNORMAL LOW (ref 36.0–46.0)
Hemoglobin: 9.1 g/dL — ABNORMAL LOW (ref 12.0–15.0)
Immature Granulocytes: 1 %
Lymphocytes Relative: 23 %
Lymphs Abs: 0.7 10*3/uL (ref 0.7–4.0)
MCH: 32.4 pg (ref 26.0–34.0)
MCHC: 32.5 g/dL (ref 30.0–36.0)
MCV: 99.6 fL (ref 80.0–100.0)
Monocytes Absolute: 0.7 10*3/uL (ref 0.1–1.0)
Monocytes Relative: 23 %
Neutro Abs: 1.4 10*3/uL — ABNORMAL LOW (ref 1.7–7.7)
Neutrophils Relative %: 49 %
Platelets: 252 10*3/uL (ref 150–400)
RBC: 2.81 MIL/uL — ABNORMAL LOW (ref 3.87–5.11)
RDW: 24.1 % — ABNORMAL HIGH (ref 11.5–15.5)
WBC: 2.9 10*3/uL — ABNORMAL LOW (ref 4.0–10.5)
nRBC: 0 % (ref 0.0–0.2)

## 2019-07-20 MED ORDER — SODIUM CHLORIDE 0.9 % IV SOLN
Freq: Once | INTRAVENOUS | Status: AC
  Administered 2019-07-20: 10:00:00 via INTRAVENOUS
  Filled 2019-07-20: qty 250

## 2019-07-20 MED ORDER — DIPHENHYDRAMINE HCL 50 MG/ML IJ SOLN
25.0000 mg | Freq: Once | INTRAMUSCULAR | Status: AC
  Administered 2019-07-20: 11:00:00 25 mg via INTRAVENOUS
  Filled 2019-07-20: qty 1

## 2019-07-20 MED ORDER — HEPARIN SOD (PORK) LOCK FLUSH 100 UNIT/ML IV SOLN
500.0000 [IU] | Freq: Once | INTRAVENOUS | Status: AC | PRN
  Administered 2019-07-20: 17:00:00 500 [IU]
  Filled 2019-07-20 (×2): qty 5

## 2019-07-20 MED ORDER — SODIUM CHLORIDE 0.9 % IV SOLN
Freq: Once | INTRAVENOUS | Status: AC
  Administered 2019-07-20: 11:00:00 via INTRAVENOUS
  Filled 2019-07-20: qty 5

## 2019-07-20 MED ORDER — PEGFILGRASTIM 6 MG/0.6ML ~~LOC~~ PSKT
6.0000 mg | PREFILLED_SYRINGE | Freq: Once | SUBCUTANEOUS | Status: AC
  Administered 2019-07-20: 16:00:00 6 mg via SUBCUTANEOUS
  Filled 2019-07-20: qty 0.6

## 2019-07-20 MED ORDER — FAMOTIDINE IN NACL 20-0.9 MG/50ML-% IV SOLN
20.0000 mg | Freq: Once | INTRAVENOUS | Status: AC
  Administered 2019-07-20: 20 mg via INTRAVENOUS
  Filled 2019-07-20: qty 50

## 2019-07-20 MED ORDER — SODIUM CHLORIDE 0.9 % IV SOLN
200.0000 mg/m2 | Freq: Once | INTRAVENOUS | Status: AC
  Administered 2019-07-20: 12:00:00 300 mg via INTRAVENOUS
  Filled 2019-07-20: qty 50

## 2019-07-20 MED ORDER — PALONOSETRON HCL INJECTION 0.25 MG/5ML
0.2500 mg | Freq: Once | INTRAVENOUS | Status: AC
  Administered 2019-07-20: 11:00:00 0.25 mg via INTRAVENOUS
  Filled 2019-07-20: qty 5

## 2019-07-20 MED ORDER — SODIUM CHLORIDE 0.9 % IV SOLN
515.4000 mg | Freq: Once | INTRAVENOUS | Status: AC
  Administered 2019-07-20: 520 mg via INTRAVENOUS
  Filled 2019-07-20: qty 52

## 2019-07-20 NOTE — Progress Notes (Signed)
Pt in for follow up and new treatment today.

## 2019-07-23 ENCOUNTER — Telehealth: Payer: Self-pay | Admitting: *Deleted

## 2019-07-23 ENCOUNTER — Ambulatory Visit: Payer: BC Managed Care – PPO | Admitting: Radiation Oncology

## 2019-07-23 ENCOUNTER — Ambulatory Visit: Payer: BC Managed Care – PPO

## 2019-07-23 NOTE — Telephone Encounter (Signed)
Left message on voice mail regarding doctor response 

## 2019-07-23 NOTE — Telephone Encounter (Signed)
Tylenol?  She can she [facility] if they have room.

## 2019-07-23 NOTE — Telephone Encounter (Signed)
Patient called reporting that she had chemotherapy last week and had an On Pro and she has been in pain since last night. Feels like she is having muscle spasms in mid section, back , and legs. She has been taking the Claritin as ordered.  Asking what to do. Please advise

## 2019-07-24 ENCOUNTER — Ambulatory Visit: Payer: BC Managed Care – PPO

## 2019-07-25 ENCOUNTER — Ambulatory Visit: Payer: BC Managed Care – PPO

## 2019-07-25 NOTE — Progress Notes (Signed)
Entered in error. Patient not seen. She has appointment with me on 10/2

## 2019-07-26 ENCOUNTER — Ambulatory Visit: Payer: BC Managed Care – PPO

## 2019-07-26 ENCOUNTER — Other Ambulatory Visit: Payer: Self-pay

## 2019-07-26 NOTE — Progress Notes (Signed)
Patient pre screened for office appointment, no questions or concerns today. 

## 2019-07-27 ENCOUNTER — Ambulatory Visit
Admission: RE | Admit: 2019-07-27 | Discharge: 2019-07-27 | Disposition: A | Discharge status: Home / Self Care | Payer: BC Managed Care – PPO | Source: Ambulatory Visit | Attending: Radiation Oncology | Admitting: Radiation Oncology

## 2019-07-27 ENCOUNTER — Encounter: Payer: Self-pay | Admitting: Radiation Oncology

## 2019-07-27 ENCOUNTER — Other Ambulatory Visit: Payer: Self-pay

## 2019-07-27 ENCOUNTER — Ambulatory Visit: Payer: BC Managed Care – PPO

## 2019-07-27 ENCOUNTER — Inpatient Hospital Stay (HOSPITAL_BASED_OUTPATIENT_CLINIC_OR_DEPARTMENT_OTHER): Payer: BC Managed Care – PPO | Admitting: Oncology

## 2019-07-27 ENCOUNTER — Inpatient Hospital Stay: Payer: BC Managed Care – PPO

## 2019-07-27 VITALS — BP 129/79 | HR 86 | Temp 97.3°F | Resp 16 | Wt 114.4 lb

## 2019-07-27 DIAGNOSIS — Z86718 Personal history of other venous thrombosis and embolism: Secondary | ICD-10-CM | POA: Diagnosis not present

## 2019-07-27 DIAGNOSIS — C772 Secondary and unspecified malignant neoplasm of intra-abdominal lymph nodes: Secondary | ICD-10-CM | POA: Diagnosis not present

## 2019-07-27 DIAGNOSIS — R918 Other nonspecific abnormal finding of lung field: Secondary | ICD-10-CM | POA: Insufficient documentation

## 2019-07-27 DIAGNOSIS — C539 Malignant neoplasm of cervix uteri, unspecified: Secondary | ICD-10-CM

## 2019-07-27 DIAGNOSIS — C530 Malignant neoplasm of endocervix: Secondary | ICD-10-CM | POA: Insufficient documentation

## 2019-07-27 DIAGNOSIS — C786 Secondary malignant neoplasm of retroperitoneum and peritoneum: Secondary | ICD-10-CM | POA: Diagnosis not present

## 2019-07-27 DIAGNOSIS — Z9221 Personal history of antineoplastic chemotherapy: Secondary | ICD-10-CM | POA: Diagnosis not present

## 2019-07-27 DIAGNOSIS — Z923 Personal history of irradiation: Secondary | ICD-10-CM | POA: Diagnosis not present

## 2019-07-27 DIAGNOSIS — Z5111 Encounter for antineoplastic chemotherapy: Secondary | ICD-10-CM | POA: Diagnosis not present

## 2019-07-27 DIAGNOSIS — F17210 Nicotine dependence, cigarettes, uncomplicated: Secondary | ICD-10-CM | POA: Diagnosis not present

## 2019-07-27 DIAGNOSIS — Z7901 Long term (current) use of anticoagulants: Secondary | ICD-10-CM | POA: Diagnosis not present

## 2019-07-27 DIAGNOSIS — D509 Iron deficiency anemia, unspecified: Secondary | ICD-10-CM | POA: Diagnosis not present

## 2019-07-27 DIAGNOSIS — Z79899 Other long term (current) drug therapy: Secondary | ICD-10-CM | POA: Diagnosis not present

## 2019-07-27 LAB — COMPREHENSIVE METABOLIC PANEL
ALT: 23 U/L (ref 0–44)
AST: 30 U/L (ref 15–41)
Albumin: 3.9 g/dL (ref 3.5–5.0)
Alkaline Phosphatase: 126 U/L (ref 38–126)
Anion gap: 9 (ref 5–15)
BUN: 23 mg/dL — ABNORMAL HIGH (ref 6–20)
CO2: 26 mmol/L (ref 22–32)
Calcium: 9.3 mg/dL (ref 8.9–10.3)
Chloride: 102 mmol/L (ref 98–111)
Creatinine, Ser: 0.79 mg/dL (ref 0.44–1.00)
GFR calc Af Amer: >60 mL/min (ref >60)
GFR calc non Af Amer: >60 mL/min (ref >60)
Glucose, Bld: 101 mg/dL — ABNORMAL HIGH (ref 70–99)
Potassium: 3.9 mmol/L (ref 3.5–5.1)
Sodium: 137 mmol/L (ref 135–145)
Total Bilirubin: 0.5 mg/dL (ref 0.3–1.2)
Total Protein: 7 g/dL (ref 6.5–8.1)

## 2019-07-27 LAB — CBC WITH DIFFERENTIAL/PLATELET
Abs Immature Granulocytes: 1.59 10*3/uL — ABNORMAL HIGH (ref 0.00–0.07)
Basophils Absolute: 0 10*3/uL (ref 0.0–0.1)
Basophils Relative: 0 %
Eosinophils Absolute: 0.1 10*3/uL (ref 0.0–0.5)
Eosinophils Relative: 1 %
HCT: 28.3 % — ABNORMAL LOW (ref 36.0–46.0)
Hemoglobin: 9 g/dL — ABNORMAL LOW (ref 12.0–15.0)
Immature Granulocytes: 12 %
Lymphocytes Relative: 7 %
Lymphs Abs: 0.9 10*3/uL (ref 0.7–4.0)
MCH: 32.5 pg (ref 26.0–34.0)
MCHC: 31.8 g/dL (ref 30.0–36.0)
MCV: 102.2 fL — ABNORMAL HIGH (ref 80.0–100.0)
Monocytes Absolute: 2.8 10*3/uL — ABNORMAL HIGH (ref 0.1–1.0)
Monocytes Relative: 22 %
Neutro Abs: 7.7 10*3/uL (ref 1.7–7.7)
Neutrophils Relative %: 58 %
Platelets: 204 10*3/uL (ref 150–400)
RBC: 2.77 MIL/uL — ABNORMAL LOW (ref 3.87–5.11)
RDW: 24.8 % — ABNORMAL HIGH (ref 11.5–15.5)
Smear Review: NORMAL
WBC: 13.2 10*3/uL — ABNORMAL HIGH (ref 4.0–10.5)
nRBC: 0.3 % — ABNORMAL HIGH (ref 0.0–0.2)

## 2019-07-27 NOTE — Progress Notes (Signed)
Radiation Oncology Follow up Note  Name: Mariah Mcmahon   Date:   07/27/2019 MRN:  [id number] DOB: 07-08-1966    This 53 y.o. 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  She is seen today in routine follow-up is doing well from a clinical standpoint she specifically denies any bleeding per vagina.  She is having no symptoms specific diarrhea or increased lower urinary tract symptoms.Marland Kitchen  Unfortunately recent PET CT scan showed mixed treatment response with new hypermetabolic activity in left supraclavicular high left paratracheal and low right paratracheal and subcarinal nodes compatible with progression of metastatic nodal disease.  There was substantial decrease hypermetabolic activity within the retroperitoneal nodal metastasis and near complete resolution of hypermetabolic activity within the cervix and uterus.  She also has tiny pulmonary nodules below PET/CT resolution.  Because of that patient has been started on systemic chemotherapy with carboplatinum and Taxol.  She is tolerating that fairly well although feels some bloating after her chemotherapy sessions.  COMPLICATIONS OF TREATMENT: none  FOLLOW UP COMPLIANCE: keeps appointments   PHYSICAL EXAM:  BP 129/79 (BP Location: Left Arm, Patient Position: Sitting)   Pulse 86   Temp (!) 97.3 F (36.3 C) (Tympanic)   Resp 16   Wt 114 lb 6.4 oz (51.9 kg)   BMI 20.92 kg/m  Well-developed well-nourished patient in NAD. HEENT reveals PERLA, EOMI, discs not visualized.  Oral cavity is clear. No oral mucosal lesions  are identified. Neck is clear without evidence of cervical or supraclavicular adenopathy. Lungs are clear to A&P. Cardiac examination is essentially unremarkable with regular rate and rhythm without murmur rub or thrill. Abdomen is benign with no organomegaly or masses noted. Motor sensory and DTR levels are equal and symmetric in the upper and lower extremities. Cranial nerves II through XII are grossly intact. Proprioception is intact. No peripheral adenopathy or edema is identified. No motor or sensory levels are noted. Crude visual fields are within normal range.  RADIOLOGY RESULTS: PET CT scan is reviewed compatible with above-stated findings  PLAN: Present time patient will continue on systemic chemotherapy.  Will follow up with her in about 3 months.  Should any areas progress that need palliative radiation therapy I would certainly reevaluate this patient at any time.  Patient continues close follow-up care and chemotherapy with medical oncology as well as being followed by GYN oncology.  Patient knows to call with any concerns.  I would like to take this opportunity to thank you for allowing me to participate in the care of your patient.Noreene Filbert, MD

## 2019-07-28 NOTE — Progress Notes (Signed)
[place]  [phone] [phone]  ID: Mariah Mcmahon OB: [birth year]  MR#: [id number]  [id number]  Patient Care Team: Venita Lick, NP as PCP - General (Nurse Practitioner) Clent Jacks, RN as Oncology Nurse Navigator  I connected with Mariah Mcmahon on 07/28/19 at  2:00 PM EDT by video enabled telemedicine visit and verified that I am speaking with the correct person using two identifiers.   I discussed the limitations, risks, security and privacy concerns of performing an evaluation and management service by telemedicine and the availability of in-person appointments. I also discussed with the patient that there may be a patient responsible charge related to this service. The patient expressed understanding and agreed to proceed.   Other persons participating in the visit and their role in the encounter: Patient, MD  Patients location: Home Providers location: Clinic  CHIEF COMPLAINT: Stage IV cervical cancer  INTERVAL HISTORY: Patient agreed to video assisted telemedicine visit for further evaluation and to discuss her toleration of cycle 1 of carboplatinum and Taxol.  Patient states she had "2 bad days" which is now resolved and she feels back to her baseline.  She continues to be highly anxious.  She does not complain of any vaginal bleeding.  She denies any abdominal pain or bloating.  She has no neurologic complaints.  She denies any recent fevers or illnesses.  She has a good appetite and denies weight loss.  She has no chest pain, shortness of breath, cough, or hemoptysis.  She denies any nausea, vomiting, constipation, or diarrhea.  She has no urinary complaints.  Patient offers no further specific complaints today.  REVIEW OF SYSTEMS:   Review of Systems  Constitutional: Negative.  Negative for fever, malaise/fatigue and weight loss.  Respiratory: Negative.  Negative for cough and shortness of breath.   Cardiovascular:  Negative.  Negative for chest pain and leg swelling.  Gastrointestinal: Negative for abdominal pain.  Genitourinary: Negative.  Negative for hematuria.  Musculoskeletal: Negative.  Negative for back pain.  Skin: Negative.  Negative for rash.  Neurological: Negative.  Negative for dizziness, focal weakness, weakness and headaches.  Psychiatric/Behavioral: The patient is nervous/anxious.     As per HPI. Otherwise, a complete review of systems is negative.  PAST MEDICAL HISTORY: Past Medical History:  Diagnosis Date   Cancer ([place])    cwervical cancer   No pertinent past medical history     PAST SURGICAL HISTORY: Past Surgical History:  Procedure Laterality Date   MASS EXCISION     Throat   PERIPHERAL VASCULAR THROMBECTOMY Left 06/05/2019   Procedure: PERIPHERAL VASCULAR THROMBECTOMY WITH IVC FILTER (LEFT LOWER EXTREMITY);  Surgeon: Katha Cabal, MD;  Location: [facility];  Service: Cardiovascular;  Laterality: Left;   PORTA CATH INSERTION N/A 06/06/2019   Procedure: PORTA CATH INSERTION;  Surgeon: Katha Cabal, MD;  Location: [facility];  Service: Cardiovascular;  Laterality: N/A;   TONSILLECTOMY      FAMILY HISTORY: Family History  Problem Relation Age of Onset   Brain cancer Mother 49   Alcohol abuse Father    Heart disease Father    Prostate cancer Father    Heart attack Father    Diabetes Father     ADVANCED DIRECTIVES (Y/N):  N  HEALTH MAINTENANCE: Social History   Tobacco Use   Smoking status: Current Every Day Smoker    Packs/day: 0.50    Years: 30.00    Pack years: 15.00  Types: Cigarettes   Smokeless tobacco: Never Used  Substance Use Topics   Alcohol use: Not Currently   Drug use: Never     Colonoscopy:  PAP:  Bone density:  Lipid panel:  No Known Allergies  Current Outpatient Medications  Medication Sig Dispense Refill   acetaminophen (TYLENOL) 500 MG tablet Take 500 mg by mouth every 6 (six)  hours as needed.     ALPRAZolam (XANAX) 0.25 MG tablet Take 1 tablet (0.25 mg total) by mouth at bedtime as needed for anxiety. 30 tablet 0   apixaban (ELIQUIS) 5 MG TABS tablet Take 1 tablet (5 mg total) by mouth 2 (two) times daily. 60 tablet 3   methocarbamol (ROBAXIN) 500 MG tablet Take 1 tablet (500 mg total) by mouth 2 (two) times daily as needed for muscle spasms (for lower back pain). 120 tablet 1   No current facility-administered medications for this visit.     OBJECTIVE: There were no vitals filed for this visit.   There is no height or weight on file to calculate BMI.    ECOG FS:0 - Asymptomatic  General: Well-developed, well-nourished, no acute distress. HEENT: Normocephalic. Neuro: Alert, answering all questions appropriately. Cranial nerves grossly intact. Psych: Normal affect.  LAB RESULTS:  Lab Results  Component Value Date   NA 137 07/27/2019   K 3.9 07/27/2019   CL 102 07/27/2019   CO2 26 07/27/2019   GLUCOSE 101 (H) 07/27/2019   BUN 23 (H) 07/27/2019   CREATININE 0.79 07/27/2019   CALCIUM 9.3 07/27/2019   PROT 7.0 07/27/2019   ALBUMIN 3.9 07/27/2019   AST 30 07/27/2019   ALT 23 07/27/2019   ALKPHOS 126 07/27/2019   BILITOT 0.5 07/27/2019   GFRNONAA >60 07/27/2019   GFRAA >60 07/27/2019    Lab Results  Component Value Date   WBC 13.2 (H) 07/27/2019   NEUTROABS 7.7 07/27/2019   HGB 9.0 (L) 07/27/2019   HCT 28.3 (L) 07/27/2019   MCV 102.2 (H) 07/27/2019   PLT 204 07/27/2019     STUDIES: Nm Pet Image Restag (ps) Skull Base To Thigh  Result Date: 07/10/2019 CLINICAL DATA:  Subsequent treatment strategy for stage IIIB cervical cancer. Completed radiation therapy 07/03/2019. Concurrent chemotherapy. EXAM: NUCLEAR MEDICINE PET SKULL BASE TO THIGH TECHNIQUE: 5.8 mCi F-18 FDG was injected intravenously. Full-ring PET imaging was performed from the skull base to thigh after the radiotracer. CT data was obtained and used for attenuation correction and  anatomic localization. Fasting blood glucose: 102 mg/dl COMPARISON:  05/07/2019 PET-CT. FINDINGS: Mediastinal blood pool activity: SUV max 2.0 Liver activity: SUV max NA NECK: There are multiple new mildly enlarged hypermetabolic left supraclavicular lymph nodes, largest 1.0 cm with max SUV 8.0 (series 3/image 50). Incidental CT findings: None. CHEST: Newly hypermetabolic high left paratracheal 0.7 cm lymph node with max SUV 4.7 (series 3/image 64). Newly hypermetabolic 0.7 cm subcarinal node with max SUV 6.0 (series 3/image 70). Newly hypermetabolic 0.7 cm low right paratracheal node with max SUV 3.4 (series 3/image 76). No hypermetabolic axillary or hilar lymph nodes. No hypermetabolic pulmonary findings. Incidental CT findings: Right internal jugular Port-A-Cath terminates at the cavoatrial junction. Low cardiac blood pool density indicates anemia. Right upper lobe 3 mm solid pulmonary nodule (series 3/image 83), stable. Medial basilar left lower lobe 4 mm pulmonary nodule seen on prior PET-CT is not appreciated on today's scan, potentially obscured by atelectasis. No acute consolidative airspace disease or new significant pulmonary nodules. ABDOMEN/PELVIS: Previously visualized hypermetabolism within bilateral retrocrural  lymph nodes and bilateral iliac lymph nodes has resolved. Substantially decreased hypermetabolism within retroperitoneal lymph nodes involving the aortocaval and left para-aortic chains. Residual mild hypermetabolism within a 1.6 cm left para-aortic node with max SUV 3.1 (series 3/image 140), previously 2.9 cm with max SUV 12.8. Residual mild hypermetabolism within a 0.9 cm aortocaval node with max SUV 2.9 (series 3/image 142), previously 2.1 cm with max SUV 9.9. No residual hypermetabolism within the previously noted 6.1 x 3.4 cm soft tissue implant in the anterior left pelvis, now measuring 0.7 x 0.6 cm (series 3/image 211). Nearly resolved hypermetabolism within cervix and uterus, with tiny  residual focus of hypermetabolism in the posterior upper cervix with max SUV 4.7, previous max SUV 19.1. No abnormal hypermetabolic activity within the liver, pancreas, adrenal glands, or spleen. Incidental CT findings: Infrarenal IVC filter and left common iliac vein stent are in place. SKELETON: No focal hypermetabolic activity to suggest skeletal metastasis. Incidental CT findings: Stable mixed lytic and sclerotic left sacral ala or lesion without significant metabolic uptake. IMPRESSION: 1. Mixed treatment response. 2. New hypermetabolic left supraclavicular, high left paratracheal, low right paratracheal and subcarinal lymphadenopathy, compatible with progression of metastatic nodal disease in the chest and left lower neck. 3. Substantially decreased hypermetabolism within retroperitoneal nodal metastases. Resolved hypermetabolism within retrocrural and pelvic nodal metastases. 4. Near complete resolution of hypermetabolism within the cervix and uterus. Resolved hypermetabolism within anterior low left pelvic peritoneal metastasis. 5. No growth of tiny pulmonary nodules, which are below PET resolution. Electronically Signed   By: Ilona Sorrel M.D.   On: 07/10/2019 17:00    ASSESSMENT: Stage IV cervical cancer.  PLAN:    1.  Stage IV cervical cancer: Biopsy results from April 24, 2019 confirmed the diagnosis. PET scan on July 10, 2019 reviewed independently with persistent metastatic disease outside the field for brachii therapy.  Patient was also evaluated by gynecology oncology.  It was decided upon to proceed with systemic chemotherapy using carboplatinum and Taxol every 3 weeks for 4-6 cycles.  Patient tolerated cycle 1 of carboplatin and Taxol last week relatively well.  Return to clinic previously scheduled in 2 weeks for further evaluation and consideration of cycle 2.  Plan to reimage at the conclusion of cycle 4.   2.  Hydronephrosis: Seen on CT scan.  Monitor closely. 3.  Venous access:  Patient now has had a port placed. 4.  Renal insufficiency: Resolved. 5.  Anemia: Hemoglobin chronic and unchanged at 9.0.  Monitor closely with active vaginal bleeding and on Eliquis.   6.  Abdominal pain: Patient does not complain of this today.  Continue hydrocodone as prescribed.  Patient also continues to use meloxicam sparingly. 7.  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  She will require Eliquis for minimum 6 months, but she will likely require extended treatment.  Monitor closely with vaginal bleeding as above.  Patient is having her filter removed in the next 1 to 2 weeks.  I provided 25 minutes of face-to-face video visit time during this encounter, and > 50% was spent counseling as documented under my assessment & plan.   Patient expressed understanding and was in agreement with this plan. She also understands that She can call clinic at any time with any questions, concerns, or complaints.   Cancer Staging Cervical cancer ([place]) Staging form: Cervix Uteri, AJCC 8th Edition - Clinical: FIGO Stage IIIB (cT3b, cN1, cM0) - Signed by Lloyd Huger, MD on 05/09/2019   Lloyd Huger, MD  07/28/2019 7:27 AM   Addendum: Patient had a PET scan on July 10, 2019 and was also evaluated by gynecology oncology several days later.  Patient continues to have persistent disease that would be outside the field for brachii therapy.  After further discussion it was decided upon to proceed with systemic chemotherapy using carboplatinum, Taxol, and Avastin every 3 weeks for 4-6 cycles.  Patient's treatment is scheduled for Friday, therefore she will require On Pro Neulasta support since she will be unable to receive injection over the weekend.  Return to clinic on July 20, 2019 initiate cycle 1.   Lloyd Huger, MD 07/28/19 7:27 AM

## 2019-07-30 ENCOUNTER — Ambulatory Visit: Payer: BC Managed Care – PPO

## 2019-08-02 ENCOUNTER — Other Ambulatory Visit: Payer: Self-pay | Admitting: Oncology

## 2019-08-02 DIAGNOSIS — C539 Malignant neoplasm of cervix uteri, unspecified: Secondary | ICD-10-CM

## 2019-08-02 MED ORDER — ALPRAZOLAM 0.25 MG PO TABS: 0.2500 mg | ORAL_TABLET | Freq: Every evening | ORAL | 0 refills | Status: DC | PRN

## 2019-08-03 ENCOUNTER — Other Ambulatory Visit
Admission: RE | Admit: 2019-08-03 | Discharge: 2019-08-03 | Disposition: A | Discharge status: Home / Self Care | Payer: BC Managed Care – PPO | Source: Ambulatory Visit | Attending: Vascular Surgery | Admitting: Vascular Surgery

## 2019-08-03 ENCOUNTER — Other Ambulatory Visit: Payer: Self-pay

## 2019-08-03 DIAGNOSIS — Z01812 Encounter for preprocedural laboratory examination: Secondary | ICD-10-CM | POA: Insufficient documentation

## 2019-08-03 DIAGNOSIS — Z20828 Contact with and (suspected) exposure to other viral communicable diseases: Secondary | ICD-10-CM | POA: Diagnosis not present

## 2019-08-04 LAB — SARS CORONAVIRUS 2 (TAT 6-24 HRS): SARS Coronavirus 2: NEGATIVE

## 2019-08-06 ENCOUNTER — Ambulatory Visit: Payer: BC Managed Care – PPO | Admitting: Radiation Oncology

## 2019-08-06 ENCOUNTER — Other Ambulatory Visit (INDEPENDENT_AMBULATORY_CARE_PROVIDER_SITE_OTHER): Payer: Self-pay | Admitting: Nurse Practitioner

## 2019-08-07 ENCOUNTER — Other Ambulatory Visit: Payer: Self-pay

## 2019-08-07 ENCOUNTER — Ambulatory Visit
Admission: RE | Admit: 2019-08-07 | Discharge: 2019-08-07 | Disposition: A | Discharge status: Home / Self Care | Payer: BC Managed Care – PPO | Attending: Vascular Surgery | Admitting: Vascular Surgery

## 2019-08-07 ENCOUNTER — Encounter
Admission: RE | Disposition: A | Discharge status: Home / Self Care | Payer: Self-pay | Source: Home / Self Care | Attending: Vascular Surgery

## 2019-08-07 DIAGNOSIS — Z87891 Personal history of nicotine dependence: Secondary | ICD-10-CM | POA: Diagnosis not present

## 2019-08-07 DIAGNOSIS — Z923 Personal history of irradiation: Secondary | ICD-10-CM | POA: Diagnosis not present

## 2019-08-07 DIAGNOSIS — Z452 Encounter for adjustment and management of vascular access device: Secondary | ICD-10-CM | POA: Insufficient documentation

## 2019-08-07 DIAGNOSIS — I82409 Acute embolism and thrombosis of unspecified deep veins of unspecified lower extremity: Secondary | ICD-10-CM

## 2019-08-07 DIAGNOSIS — C539 Malignant neoplasm of cervix uteri, unspecified: Secondary | ICD-10-CM | POA: Diagnosis not present

## 2019-08-07 DIAGNOSIS — I82422 Acute embolism and thrombosis of left iliac vein: Secondary | ICD-10-CM | POA: Insufficient documentation

## 2019-08-07 DIAGNOSIS — I824Y2 Acute embolism and thrombosis of unspecified deep veins of left proximal lower extremity: Secondary | ICD-10-CM | POA: Diagnosis not present

## 2019-08-07 HISTORY — PX: IVC FILTER REMOVAL: CATH118246

## 2019-08-07 HISTORY — DX: Malignant neoplasm of cervix uteri, unspecified: C53.9

## 2019-08-07 SURGERY — IVC FILTER REMOVAL
Anesthesia: Moderate Sedation

## 2019-08-07 MED ORDER — MIDAZOLAM HCL 2 MG/ML PO SYRP: 8.0000 mg | ORAL_SOLUTION | Freq: Once | ORAL | Status: DC | PRN

## 2019-08-07 MED ORDER — FENTANYL CITRATE (PF) 100 MCG/2ML IJ SOLN
INTRAMUSCULAR | Status: DC | PRN
  Administered 2019-08-07 (×3): 50 ug via INTRAVENOUS

## 2019-08-07 MED ORDER — FENTANYL CITRATE (PF) 100 MCG/2ML IJ SOLN
INTRAMUSCULAR | Status: AC
  Filled 2019-08-07: qty 2

## 2019-08-07 MED ORDER — CEFAZOLIN SODIUM-DEXTROSE 2-4 GM/100ML-% IV SOLN
INTRAVENOUS | Status: AC
  Administered 2019-08-07: 08:00:00 2 g via INTRAVENOUS
  Filled 2019-08-07: qty 100

## 2019-08-07 MED ORDER — IODIXANOL 320 MG/ML IV SOLN
INTRAVENOUS | Status: DC | PRN
  Administered 2019-08-07: 15 mL via INTRAVENOUS

## 2019-08-07 MED ORDER — DIPHENHYDRAMINE HCL 50 MG/ML IJ SOLN: 50.0000 mg | Freq: Once | INTRAMUSCULAR | Status: DC | PRN

## 2019-08-07 MED ORDER — SODIUM CHLORIDE 0.9 % IV SOLN
INTRAVENOUS | Status: DC
  Administered 2019-08-07: 08:00:00 via INTRAVENOUS

## 2019-08-07 MED ORDER — ONDANSETRON HCL 4 MG/2ML IJ SOLN: 4.0000 mg | Freq: Four times a day (QID) | INTRAMUSCULAR | Status: DC | PRN

## 2019-08-07 MED ORDER — MIDAZOLAM HCL 5 MG/5ML IJ SOLN
INTRAMUSCULAR | Status: AC
  Filled 2019-08-07: qty 5

## 2019-08-07 MED ORDER — CEFAZOLIN SODIUM-DEXTROSE 2-4 GM/100ML-% IV SOLN
2.0000 g | Freq: Once | INTRAVENOUS | Status: AC
  Administered 2019-08-07: 08:00:00 2 g via INTRAVENOUS

## 2019-08-07 MED ORDER — HYDROMORPHONE HCL 1 MG/ML IJ SOLN: 1.0000 mg | Freq: Once | INTRAMUSCULAR | Status: DC | PRN

## 2019-08-07 MED ORDER — FAMOTIDINE 20 MG PO TABS: 40.0000 mg | ORAL_TABLET | Freq: Once | ORAL | Status: DC | PRN

## 2019-08-07 MED ORDER — MIDAZOLAM HCL 2 MG/2ML IJ SOLN
INTRAMUSCULAR | Status: DC | PRN
  Administered 2019-08-07: 1 mg via INTRAVENOUS
  Administered 2019-08-07: 2 mg via INTRAVENOUS
  Administered 2019-08-07: 1 mg via INTRAVENOUS

## 2019-08-07 MED ORDER — METHYLPREDNISOLONE SODIUM SUCC 125 MG IJ SOLR: 125.0000 mg | Freq: Once | INTRAMUSCULAR | Status: DC | PRN

## 2019-08-07 SURGICAL SUPPLY — 7 items
COVER PROBE U/S 5X48 (MISCELLANEOUS) ×2 IMPLANT
NDL ENTRY 21GA 7CM ECHOTIP (NEEDLE) IMPLANT
NEEDLE ENTRY 21GA 7CM ECHOTIP (NEEDLE) ×3 IMPLANT
PACK ANGIOGRAPHY (CUSTOM PROCEDURE TRAY) ×2 IMPLANT
SET INTRO CAPELLA COAXIAL (SET/KITS/TRAYS/PACK) ×2 IMPLANT
SET VENACAVA FILTER RETRIEVAL (MISCELLANEOUS) ×2 IMPLANT
WIRE J 3MM .035X145CM (WIRE) ×2 IMPLANT

## 2019-08-07 NOTE — H&P (Signed)
[facility] Admission History & Physical  MRN : [id number]  Mariah Mcmahon is a 53 y.o. (1966-03-10) female who presents with chief complaint of No chief complaint on file. Marland Kitchen  History of Present Illness:  Patient presents to [facility] for removal of a previously placed IVC filter.  She has a history of DVT and was treated with thrombectomy in July 2020.  As noted, DVT was identified at [facility] by Duplex ultrasound.  The initial symptoms were pain and swelling in the lower extremity.  Patient presented to [facility] June 05, 2019 with profound swelling and pain of the left lower extremity.  She did undergo mechanical thrombectomy of the femoral veins as well as the iliac veins and a 14 x 60 via Novo stent was placed in the common iliac vein on the left.  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  The patient notes the leg not painful at all and has minimal swelling with dependency.  Symptoms are much better with elevation.  The patient notes minimal edema in the morning which steadily worsens throughout the day.    The patient has not been using compression therapy at this point.  No SOB or pleuritic chest pains.  No cough or hemoptysis.  No blood per rectum or blood in any sputum.  No excessive bruising per the patient.   Current Facility-Administered Medications  Medication Dose Route Frequency Provider Last Rate Last Dose  . 0.9 %  sodium chloride infusion   Intravenous Continuous Kris Hartmann, NP 75 mL/hr at 08/07/19 0737    . ceFAZolin (ANCEF) 2-4 GM/100ML-% IVPB           . ceFAZolin (ANCEF) IVPB 2g/100 mL premix  2 g Intravenous Once Kris Hartmann, NP      . diphenhydrAMINE (BENADRYL) injection 50 mg  50 mg Intravenous Once PRN Kris Hartmann, NP      . famotidine (PEPCID) tablet 40 mg  40 mg Oral Once PRN Kris Hartmann, NP      . HYDROmorphone (DILAUDID) injection 1 mg  1 mg Intravenous  Once PRN Eulogio Ditch E, NP      . methylPREDNISolone sodium succinate (SOLU-MEDROL) 125 mg/2 mL injection 125 mg  125 mg Intravenous Once PRN Eulogio Ditch E, NP      . midazolam (VERSED) 2 MG/ML syrup 8 mg  8 mg Oral Once PRN Kris Hartmann, NP      . ondansetron (ZOFRAN) injection 4 mg  4 mg Intravenous Q6H PRN Kris Hartmann, NP        Past Medical History:  Diagnosis Date  . Cancer ([place])    cwervical cancer  . Cervical cancer ([place])   . No pertinent past medical history     Past Surgical History:  Procedure Laterality Date  . MASS EXCISION     Throat  . PERIPHERAL VASCULAR THROMBECTOMY Left 06/05/2019   Procedure: PERIPHERAL VASCULAR THROMBECTOMY WITH IVC FILTER (LEFT LOWER EXTREMITY);  Surgeon: Katha Cabal, MD;  Location: [facility];  Service: Cardiovascular;  Laterality: Left;  . PORTA CATH INSERTION N/A 06/06/2019   Procedure: PORTA CATH INSERTION;  Surgeon: Katha Cabal, MD;  Location: [facility];  Service: Cardiovascular;  Laterality: N/A;  . TONSILLECTOMY      Social History Social History   Tobacco Use  . Smoking status: Former Smoker    Packs/day: 0.50  Years: 30.00    Pack years: 15.00    Types: Cigarettes    Quit date: 07/03/2019    Years since quitting: 0.0  . Smokeless tobacco: Never Used  Substance Use Topics  . Alcohol use: Not Currently  . Drug use: Never    Family History Family History  Problem Relation Age of Onset  . Brain cancer Mother 36  . Alcohol abuse Father   . Heart disease Father   . Prostate cancer Father   . Heart attack Father   . Diabetes Father   No family history of bleeding/clotting disorders, porphyria or autoimmune disease   No Known Allergies   REVIEW OF SYSTEMS (Negative unless checked)  Constitutional: [] Weight loss  [] Fever  [] Chills Cardiac: [] Chest pain   [] Chest pressure   [] Palpitations   [] Shortness of breath when laying flat   [] Shortness of breath at rest   [] Shortness of  breath with exertion. Vascular:  [] Pain in legs with walking   [] Pain in legs at rest   [] Pain in legs when laying flat   [] Claudication   [] Pain in feet when walking  [] Pain in feet at rest  [] Pain in feet when laying flat   [x] History of DVT   [] Phlebitis   [x] Swelling in legs   [] Varicose veins   [] Non-healing ulcers Pulmonary:   [] Uses home oxygen   [] Productive cough   [] Hemoptysis   [] Wheeze  [] COPD   [] Asthma Neurologic:  [] Dizziness  [] Blackouts   [] Seizures   [] History of stroke   [] History of TIA  [] Aphasia   [] Temporary blindness   [] Dysphagia   [] Weakness or numbness in arms   [] Weakness or numbness in legs Musculoskeletal:  [] Arthritis   [] Joint swelling   [] Joint pain   [] Low back pain Hematologic:  [] Easy bruising  [] Easy bleeding   [] Hypercoagulable state   [] Anemic  [] Hepatitis Gastrointestinal:  [] Blood in stool   [] Vomiting blood  [] Gastroesophageal reflux/heartburn   [] Difficulty swallowing. Genitourinary:  [] Chronic kidney disease   [] Difficult urination  [] Frequent urination  [] Burning with urination   [] Blood in urine Skin:  [] Rashes   [] Ulcers   [] Wounds Psychological:  [] History of anxiety   []  History of major depression.  Physical Examination  Vitals:   08/07/19 0709  BP: 124/77  Pulse: 93  Resp: 13  Temp: 98.5 F (36.9 C)  TempSrc: Oral  SpO2: 100%  Weight: 51.3 kg  Height: 5\' 2"  (1.575 m)   Body mass index is 20.67 kg/m. Gen: WD/WN, NAD Head: [place]/AT, No temporalis wasting. Prominent temp pulse not noted. Ear/Nose/Throat: Hearing grossly intact, nares w/o erythema or drainage, oropharynx w/o Erythema/Exudate,  Eyes: Conjunctiva clear, sclera non-icteric Neck: Trachea midline.  No JVD.  Pulmonary:  Good air movement, respirations not labored, no use of accessory muscles.  Cardiac: RRR, normal S1, S2. Vascular: Bilateral 1-2+ lower extremity edema with mild venous changes Vessel Right Left  Radial Palpable Palpable  PT Palpable Palpable  DP Palpable  Palpable   Gastrointestinal: soft, non-tender/non-distended. No guarding/reflex.  Musculoskeletal: M/S 5/5 throughout.  Extremities without ischemic changes.  No deformity or atrophy.  Neurologic: Sensation grossly intact in extremities.  Symmetrical.  Speech is fluent. Motor exam as listed above. Psychiatric: Judgment intact, Mood & affect appropriate for pt's clinical situation. Dermatologic: No rashes or ulcers noted.  No cellulitis or open wounds. Lymph : No Cervical, Axillary, or Inguinal lymphadenopathy.     CBC Lab Results  Component Value Date   WBC 13.2 (H) 07/27/2019   HGB  9.0 (L) 07/27/2019   HCT 28.3 (L) 07/27/2019   MCV 102.2 (H) 07/27/2019   PLT 204 07/27/2019    BMET    Component Value Date/Time   NA 137 07/27/2019 0859   NA 138 04/09/2019 0918   K 3.9 07/27/2019 0859   CL 102 07/27/2019 0859   CO2 26 07/27/2019 0859   GLUCOSE 101 (H) 07/27/2019 0859   BUN 23 (H) 07/27/2019 0859   BUN 12 04/09/2019 0918   CREATININE 0.79 07/27/2019 0859   CALCIUM 9.3 07/27/2019 0859   GFRNONAA >60 07/27/2019 0859   GFRAA >60 07/27/2019 0859   Estimated Creatinine Clearance: 65.8 mL/min (by C-G formula based on SCr of 0.79 mg/dL).  COAG Lab Results  Component Value Date   INR 1.1 06/05/2019    Radiology Nm Pet Image Restag (ps) Skull Base To Thigh  Result Date: 07/10/2019 CLINICAL DATA:  Subsequent treatment strategy for stage IIIB cervical cancer. Completed radiation therapy 07/03/2019. Concurrent chemotherapy. EXAM: NUCLEAR MEDICINE PET SKULL BASE TO THIGH TECHNIQUE: 5.8 mCi F-18 FDG was injected intravenously. Full-ring PET imaging was performed from the skull base to thigh after the radiotracer. CT data was obtained and used for attenuation correction and anatomic localization. Fasting blood glucose: 102 mg/dl COMPARISON:  05/07/2019 PET-CT. FINDINGS: Mediastinal blood pool activity: SUV max 2.0 Liver activity: SUV max NA NECK: There are multiple new mildly enlarged  hypermetabolic left supraclavicular lymph nodes, largest 1.0 cm with max SUV 8.0 (series 3/image 50). Incidental CT findings: None. CHEST: Newly hypermetabolic high left paratracheal 0.7 cm lymph node with max SUV 4.7 (series 3/image 64). Newly hypermetabolic 0.7 cm subcarinal node with max SUV 6.0 (series 3/image 70). Newly hypermetabolic 0.7 cm low right paratracheal node with max SUV 3.4 (series 3/image 76). No hypermetabolic axillary or hilar lymph nodes. No hypermetabolic pulmonary findings. Incidental CT findings: Right internal jugular Port-A-Cath terminates at the cavoatrial junction. Low cardiac blood pool density indicates anemia. Right upper lobe 3 mm solid pulmonary nodule (series 3/image 83), stable. Medial basilar left lower lobe 4 mm pulmonary nodule seen on prior PET-CT is not appreciated on today's scan, potentially obscured by atelectasis. No acute consolidative airspace disease or new significant pulmonary nodules. ABDOMEN/PELVIS: Previously visualized hypermetabolism within bilateral retrocrural lymph nodes and bilateral iliac lymph nodes has resolved. Substantially decreased hypermetabolism within retroperitoneal lymph nodes involving the aortocaval and left para-aortic chains. Residual mild hypermetabolism within a 1.6 cm left para-aortic node with max SUV 3.1 (series 3/image 140), previously 2.9 cm with max SUV 12.8. Residual mild hypermetabolism within a 0.9 cm aortocaval node with max SUV 2.9 (series 3/image 142), previously 2.1 cm with max SUV 9.9. No residual hypermetabolism within the previously noted 6.1 x 3.4 cm soft tissue implant in the anterior left pelvis, now measuring 0.7 x 0.6 cm (series 3/image 211). Nearly resolved hypermetabolism within cervix and uterus, with tiny residual focus of hypermetabolism in the posterior upper cervix with max SUV 4.7, previous max SUV 19.1. No abnormal hypermetabolic activity within the liver, pancreas, adrenal glands, or spleen. Incidental CT  findings: Infrarenal IVC filter and left common iliac vein stent are in place. SKELETON: No focal hypermetabolic activity to suggest skeletal metastasis. Incidental CT findings: Stable mixed lytic and sclerotic left sacral ala or lesion without significant metabolic uptake. IMPRESSION: 1. Mixed treatment response. 2. New hypermetabolic left supraclavicular, high left paratracheal, low right paratracheal and subcarinal lymphadenopathy, compatible with progression of metastatic nodal disease in the chest and left lower neck. 3. Substantially decreased hypermetabolism  within retroperitoneal nodal metastases. Resolved hypermetabolism within retrocrural and pelvic nodal metastases. 4. Near complete resolution of hypermetabolism within the cervix and uterus. Resolved hypermetabolism within anterior low left pelvic peritoneal metastasis. 5. No growth of tiny pulmonary nodules, which are below PET resolution. Electronically Signed   By: Ilona Sorrel M.D.   On: 07/10/2019 17:00     Assessment/Plan 1. Acute deep vein thrombosis (DVT) of iliac vein of left lower extremity ([place]) Patient has done very well status post thrombectomy intervention.  Given that there is a mechanical indication or cause for her clot I would be inclined to stop her anti-coagulation after she is been treated for her pulmonary embolism.  I would defer the exact length of time to Dr. Grayland Ormond.  I will plan to remove her IVC filter at the end of September.  Risks and benefits were reviewed all questions answered patient agrees to proceed.  2. Malignant neoplasm of cervix, unspecified site [facility]) They have now used her port for chemotherapy it went very well.  Continue using the Infuse-a-Port.  Hortencia Pilar, MD  08/07/2019 7:58 AM

## 2019-08-07 NOTE — Op Note (Signed)
  OPERATIVE NOTE   PRE-OPERATIVE DIAGNOSIS: DVT   POST-OPERATIVE DIAGNOSIS: Same  PROCEDURE: 1. Retrieval of IVC Filter 2. Inferior Vena Cavagram  SURGEON: Katha Cabal, M.D.  ANESTHESIA:  Conscious sedation was administered under my direct supervision by the interventional radiology RN. IV Versed plus fentanyl were utilized. Continuous ECG, pulse oximetry and blood pressure was monitored throughout the entire procedure. Conscious sedation was for a total of 25 minutes.  ESTIMATED BLOOD LOSS: Minimal cc  FINDING(S):inferior vena cava is widely patent filter is in place in good position. Filter is removed without incident  SPECIMEN(S):  IVC filter intact  INDICATIONS:   Mariah Mcmahon is a 53 y.o. female who presents with DVT and PE. The patient has now tolerated anticoagulation for several months. Therefore, the IVC filter is recommended to be removed. The risks and benefits were reviewed with the patient all questions were answered and they agreed to proceed with IVC filter retrieval. Oral anticoagulation will be continued.  DESCRIPTION: After obtaining full informed written consent, the patient was brought back to the Special Procedure Suite and placed in the supine position.  The patient received IV antibiotics prior to induction.  After obtaining adequate sedation, the patient was prepped and draped in the standard fashion and appropriate time out is called.     Ultrasound was placed in a sterile sleeve.The right neck was then imaged with ultrasound.   Jugular vein was identified it is echolucent and homogeneous indicating patency. 1% lidocaine is then infiltrated under ultrasound visualization and subsequently a Seldinger needle is inserted under real-time ultrasound guidance.  J-wire is then advanced into the inferior vena cava under fluoroscopic guidance. With the tip of the sheath at the confluence of the iliac veins inferior vena caval imaging is performed.  After review of  the image the sheath is repositioned to above the filter and the snares introduced. Snares opened and the hook is secured without difficulty. The filter is then collapsed within the sheath and removed without difficulty.  Sheath is removed by pressures held the patient tolerated the procedure well and there were no immediate complications.  Interpretation: inferior vena cava is widely patent filter is in place in good position. Filter is removed without incident.     COMPLICATIONS: None  CONDITION: [name], M.D. [facility] Office: [phone]   08/07/2019, 9:00 AM

## 2019-08-07 NOTE — Discharge Instructions (Signed)
Inferior Vena Cava Filter Removal, Care After °This sheet gives you information about how to care for yourself after your procedure. Your health care provider may also give you more specific instructions. If you have problems or questions, contact your health care provider. °What can I expect after the procedure? °After the procedure, it is common to have: °· Mild pain and bruising around your incision in your neck or groin. °· Fatigue. °Follow these instructions at home: °Incision care ° °· Follow instructions from your health care provider about how to take care of your incision. Make sure you: °? Wash your hands with soap and water before you change your bandage (dressing). If soap and water are not available, use hand sanitizer. °? Change your dressing as told by your health care provider. °· Check your incision area every day for signs of infection. Check for: °? Redness, swelling, or more pain. °? Fluid or blood. °? Warmth. °? Pus or a bad smell. °General instructions °· Take over-the-counter and prescription medicines only as told by your health care provider. °· Do not take baths, swim, or use a hot tub until your health care provider approves. Ask your health care provider if you may take showers. You may only be allowed to take sponge baths. °· Do not drive for 24 hours if you were given a medicine to help you relax (sedative) during your procedure. °· Return to your normal activities as told by your health care provider. Ask your health care provider what activities are safe for you. °· Keep all follow-up visits as told by your health care provider. This is important. °Contact a health care provider if: °· You have chills or a fever. °· You have redness, swelling, or more pain around your incision. °· Your incision feels warm to the touch. °· You have pus or a bad smell coming from your incision. °Get help right away if: °· You have blood coming from your incision (active bleeding). °? If you have  bleeding from the incision site, lie down, apply pressure to the area with a clean cloth or gauze, and get help right away. °· You have chest pain. °· You have difficulty breathing. °Summary °· Follow instructions from your health care provider about how to take care of your incision. °· Return to your normal activities as told by your health care provider. °· Check your incision area every day for signs of infection. °· Get help right away if you have active bleeding, chest pain, or trouble breathing. °This information is not intended to replace advice given to you by your health care provider. Make sure you discuss any questions you have with your health care provider. °Document Released: 05/05/2017 Document Revised: 10/07/2017 Document Reviewed: 05/05/2017 °Elsevier Patient Education © 2020 [employer] Inc[employer] ° ° ° °Moderate Conscious Sedation, Adult, Care After °These instructions provide you with information about caring for yourself after your procedure. Your health care provider may also give you more specific instructions. Your treatment has been planned according to current medical practices, but problems sometimes occur. Call your health care provider if you have any problems or questions after your procedure. °What can I expect after the procedure? °After your procedure, it is common: °· To feel sleepy for several hours. °· To feel clumsy and have poor balance for several hours. °· To have poor judgment for several hours. °· To vomit if you eat too soon. °Follow these instructions at home: °For at least 24 hours after the procedure: ° °·   Do not: ? Participate in activities where you could fall or become injured. ? Drive. ? Use heavy machinery. ? Drink alcohol. ? Take sleeping pills or medicines that cause drowsiness. ? Make important decisions or sign legal documents. ? Take care of children on your own.  Rest. Eating and drinking  Follow the diet recommended by your health care provider.  If you  vomit: ? Drink water, juice, or soup when you can drink without vomiting. ? Make sure you have little or no nausea before eating solid foods. General instructions  Have a responsible adult stay with you until you are awake and alert.  Take over-the-counter and prescription medicines only as told by your health care provider.  If you smoke, do not smoke without supervision.  Keep all follow-up visits as told by your health care provider. This is important. Contact a health care provider if:  You keep feeling nauseous or you keep vomiting.  You feel light-headed.  You develop a rash.  You have a fever. Get help right away if:  You have trouble breathing. This information is not intended to replace advice given to you by your health care provider. Make sure you discuss any questions you have with your health care provider. Document Released: 08/15/2013 Document Revised: 10/07/2017 Document Reviewed: 02/14/2016 Elsevier Patient Education  2020 [employer].

## 2019-08-09 ENCOUNTER — Other Ambulatory Visit: Payer: Self-pay

## 2019-08-09 ENCOUNTER — Encounter: Payer: Self-pay | Admitting: Oncology

## 2019-08-09 NOTE — Progress Notes (Signed)
Patient stated that she had been doing well with no complaints. 

## 2019-08-10 ENCOUNTER — Inpatient Hospital Stay (HOSPITAL_BASED_OUTPATIENT_CLINIC_OR_DEPARTMENT_OTHER): Payer: BC Managed Care – PPO | Admitting: Oncology

## 2019-08-10 ENCOUNTER — Inpatient Hospital Stay: Payer: BC Managed Care – PPO | Attending: Oncology | Admitting: *Deleted

## 2019-08-10 ENCOUNTER — Inpatient Hospital Stay: Payer: BC Managed Care – PPO

## 2019-08-10 ENCOUNTER — Inpatient Hospital Stay (HOSPITAL_BASED_OUTPATIENT_CLINIC_OR_DEPARTMENT_OTHER): Payer: BC Managed Care – PPO | Admitting: Hospice and Palliative Medicine

## 2019-08-10 ENCOUNTER — Other Ambulatory Visit: Payer: Self-pay

## 2019-08-10 VITALS — BP 105/64 | HR 86 | Temp 97.2°F | Wt 119.0 lb

## 2019-08-10 DIAGNOSIS — F419 Anxiety disorder, unspecified: Secondary | ICD-10-CM | POA: Diagnosis not present

## 2019-08-10 DIAGNOSIS — D701 Agranulocytosis secondary to cancer chemotherapy: Secondary | ICD-10-CM | POA: Diagnosis not present

## 2019-08-10 DIAGNOSIS — Z5189 Encounter for other specified aftercare: Secondary | ICD-10-CM | POA: Diagnosis not present

## 2019-08-10 DIAGNOSIS — C539 Malignant neoplasm of cervix uteri, unspecified: Secondary | ICD-10-CM

## 2019-08-10 DIAGNOSIS — Z95828 Presence of other vascular implants and grafts: Secondary | ICD-10-CM

## 2019-08-10 DIAGNOSIS — Z515 Encounter for palliative care: Secondary | ICD-10-CM

## 2019-08-10 DIAGNOSIS — T451X5A Adverse effect of antineoplastic and immunosuppressive drugs, initial encounter: Secondary | ICD-10-CM | POA: Diagnosis not present

## 2019-08-10 DIAGNOSIS — Z5111 Encounter for antineoplastic chemotherapy: Secondary | ICD-10-CM | POA: Insufficient documentation

## 2019-08-10 LAB — COMPREHENSIVE METABOLIC PANEL
ALT: 24 U/L (ref 0–44)
AST: 33 U/L (ref 15–41)
Albumin: 3.7 g/dL (ref 3.5–5.0)
Alkaline Phosphatase: 104 U/L (ref 38–126)
Anion gap: 9 (ref 5–15)
BUN: 20 mg/dL (ref 6–20)
CO2: 23 mmol/L (ref 22–32)
Calcium: 9.1 mg/dL (ref 8.9–10.3)
Chloride: 107 mmol/L (ref 98–111)
Creatinine, Ser: 0.8 mg/dL (ref 0.44–1.00)
GFR calc Af Amer: >60 mL/min (ref >60)
GFR calc non Af Amer: >60 mL/min (ref >60)
Glucose, Bld: 114 mg/dL — ABNORMAL HIGH (ref 70–99)
Potassium: 3.5 mmol/L (ref 3.5–5.1)
Sodium: 139 mmol/L (ref 135–145)
Total Bilirubin: 0.4 mg/dL (ref 0.3–1.2)
Total Protein: 6.8 g/dL (ref 6.5–8.1)

## 2019-08-10 LAB — CBC WITH DIFFERENTIAL/PLATELET
Abs Immature Granulocytes: 0.04 10*3/uL (ref 0.00–0.07)
Basophils Absolute: 0 10*3/uL (ref 0.0–0.1)
Basophils Relative: 1 %
Eosinophils Absolute: 0 10*3/uL (ref 0.0–0.5)
Eosinophils Relative: 1 %
HCT: 27.9 % — ABNORMAL LOW (ref 36.0–46.0)
Hemoglobin: 8.9 g/dL — ABNORMAL LOW (ref 12.0–15.0)
Immature Granulocytes: 1 %
Lymphocytes Relative: 11 %
Lymphs Abs: 0.5 10*3/uL — ABNORMAL LOW (ref 0.7–4.0)
MCH: 33.7 pg (ref 26.0–34.0)
MCHC: 31.9 g/dL (ref 30.0–36.0)
MCV: 105.7 fL — ABNORMAL HIGH (ref 80.0–100.0)
Monocytes Absolute: 0.7 10*3/uL (ref 0.1–1.0)
Monocytes Relative: 18 %
Neutro Abs: 2.8 10*3/uL (ref 1.7–7.7)
Neutrophils Relative %: 68 %
Platelets: 260 10*3/uL (ref 150–400)
RBC: 2.64 MIL/uL — ABNORMAL LOW (ref 3.87–5.11)
WBC: 4 10*3/uL (ref 4.0–10.5)
nRBC: 0 % (ref 0.0–0.2)

## 2019-08-10 LAB — SAMPLE TO BLOOD BANK

## 2019-08-10 LAB — PROTEIN, URINE, RANDOM: Total Protein, Urine: 50 mg/dL

## 2019-08-10 MED ORDER — SODIUM CHLORIDE 0.9 % IV SOLN
Freq: Once | INTRAVENOUS | Status: AC
  Administered 2019-08-10: 10:00:00 via INTRAVENOUS
  Filled 2019-08-10: qty 5

## 2019-08-10 MED ORDER — FAMOTIDINE IN NACL 20-0.9 MG/50ML-% IV SOLN
20.0000 mg | Freq: Once | INTRAVENOUS | Status: AC
  Administered 2019-08-10: 20 mg via INTRAVENOUS
  Filled 2019-08-10: qty 50

## 2019-08-10 MED ORDER — PALONOSETRON HCL INJECTION 0.25 MG/5ML
0.2500 mg | Freq: Once | INTRAVENOUS | Status: AC
  Administered 2019-08-10: 10:00:00 0.25 mg via INTRAVENOUS
  Filled 2019-08-10: qty 5

## 2019-08-10 MED ORDER — DIPHENHYDRAMINE HCL 50 MG/ML IJ SOLN
25.0000 mg | Freq: Once | INTRAMUSCULAR | Status: AC
  Administered 2019-08-10: 25 mg via INTRAVENOUS
  Filled 2019-08-10: qty 1

## 2019-08-10 MED ORDER — SODIUM CHLORIDE 0.9% FLUSH
10.0000 mL | Freq: Once | INTRAVENOUS | Status: AC
  Administered 2019-08-10: 10 mL via INTRAVENOUS
  Filled 2019-08-10: qty 10

## 2019-08-10 MED ORDER — HEPARIN SOD (PORK) LOCK FLUSH 100 UNIT/ML IV SOLN
500.0000 [IU] | Freq: Once | INTRAVENOUS | Status: AC | PRN
  Administered 2019-08-10: 15:00:00 500 [IU]
  Filled 2019-08-10: qty 5

## 2019-08-10 MED ORDER — PEGFILGRASTIM 6 MG/0.6ML ~~LOC~~ PSKT
6.0000 mg | PREFILLED_SYRINGE | Freq: Once | SUBCUTANEOUS | Status: DC
  Administered 2019-08-10: 6 mg via SUBCUTANEOUS

## 2019-08-10 MED ORDER — SODIUM CHLORIDE 0.9 % IV SOLN
Freq: Once | INTRAVENOUS | Status: AC
  Administered 2019-08-10: 10:00:00 via INTRAVENOUS
  Filled 2019-08-10: qty 250

## 2019-08-10 MED ORDER — SODIUM CHLORIDE 0.9 % IV SOLN
520.0000 mg | Freq: Once | INTRAVENOUS | Status: AC
  Administered 2019-08-10: 520 mg via INTRAVENOUS
  Filled 2019-08-10: qty 52

## 2019-08-10 MED ORDER — SODIUM CHLORIDE 0.9 % IV SOLN
200.0000 mg/m2 | Freq: Once | INTRAVENOUS | Status: AC
  Administered 2019-08-10: 300 mg via INTRAVENOUS
  Filled 2019-08-10: qty 50

## 2019-08-10 NOTE — Progress Notes (Signed)
[facility]  Telephone:([phone] [phone]   Name: Mariah Mcmahon Date: 08/10/2019 MRN: [id number]  DOB: Jun 14, 1966  Patient Care Team: Venita Lick, NP as PCP - General (Nurse Practitioner) Clent Jacks, RN as Oncology Nurse Navigator    REASON FOR CONSULTATION: Palliative Care consult requested for this 53 y.o. 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  Patient is felt not to be a surgical candidate and treatment was initiated with concurrent chemoradiation.  Patient was hospitalized 06/05/2019 -06/07/2019 with acute left lower extremity DVT/pulmonary embolism status post thrombolysis and IVC filter placement.  IVC filter was later removed.  Patient was referred to palliative care to help address goals and manage ongoing symptoms.   SOCIAL HISTORY:     reports that she quit smoking about 5 weeks ago. Her smoking use included cigarettes. She has a 15.00 pack-year smoking history. She has never used smokeless tobacco. She reports previous alcohol use. She reports that she does not use drugs.  Patient is not married.  She lives at home alone.  She has a son who is involved in her care, who lives nearby.  Patient previously worked at [employer] in [place] and recently stopped working due to [employer].  ADVANCE DIRECTIVES:  Does not have  CODE STATUS:   PAST MEDICAL HISTORY: Past Medical History:  Diagnosis Date  . Cancer ([place])    cwervical cancer  . Cervical cancer ([place])   . No pertinent past medical history     PAST SURGICAL HISTORY:  Past Surgical History:  Procedure Laterality Date  . IVC FILTER REMOVAL N/A 08/07/2019   Procedure: IVC FILTER REMOVAL;  Surgeon: Katha Cabal, MD;  Location: [facility];  Service: Cardiovascular;  Laterality: N/A;  . MASS EXCISION     Throat   . PERIPHERAL VASCULAR THROMBECTOMY Left 06/05/2019   Procedure: PERIPHERAL VASCULAR THROMBECTOMY WITH IVC FILTER (LEFT LOWER EXTREMITY);  Surgeon: Katha Cabal, MD;  Location: [facility];  Service: Cardiovascular;  Laterality: Left;  . PORTA CATH INSERTION N/A 06/06/2019   Procedure: PORTA CATH INSERTION;  Surgeon: Katha Cabal, MD;  Location: [facility];  Service: Cardiovascular;  Laterality: N/A;  . TONSILLECTOMY      HEMATOLOGY/ONCOLOGY HISTORY:  Oncology History  Cervical cancer ([place])  05/04/2019 Initial Diagnosis   Cervical cancer ([place])   05/09/2019 Cancer Staging   Staging form: Cervix Uteri, AJCC 8th Edition - Clinical: FIGO Stage IIIB (cT3b, cN1, cM0) - Signed by Lloyd Huger, MD on 05/09/2019   05/17/2019 - 07/04/2019 Chemotherapy   The patient had palonosetron (ALOXI) injection 0.25 mg, 0.25 mg, Intravenous,  Once, 6 of 7 cycles Administration: 0.25 mg (05/17/2019), 0.25 mg (05/24/2019), 0.25 mg (05/31/2019), 0.25 mg (06/14/2019), 0.25 mg (06/22/2019), 0.25 mg (06/28/2019) CISplatin (PLATINOL) 61 mg in sodium chloride 0.9 % 250 mL chemo infusion, 40 mg/m2 = 61 mg, Intravenous,  Once, 6 of 7 cycles Administration: 61 mg (05/17/2019), 61 mg (05/24/2019), 61 mg (05/31/2019), 61 mg (06/14/2019), 61 mg (06/22/2019), 61 mg (06/28/2019) fosaprepitant (EMEND) 150 mg, dexamethasone (DECADRON) 12 mg in sodium chloride 0.9 % 145 mL IVPB, , Intravenous,  Once, 6 of 7 cycles Administration:  (05/17/2019),  (05/24/2019),  (05/31/2019),  (06/14/2019),  (06/22/2019),  (06/28/2019)  for chemotherapy treatment.    07/20/2019 -  Chemotherapy   The patient had palonosetron ([place])  injection 0.25 mg, 0.25 mg, Intravenous,  Once, 2 of 6 cycles Administration: 0.25 mg (07/20/2019) pegfilgrastim (NEULASTA ONPRO KIT) injection 6 mg, 6 mg, Subcutaneous, Once, 1 of 1 cycle Administration: 6 mg (07/20/2019) CARBOplatin (PARAPLATIN) 520 mg in sodium chloride 0.9 % 250 mL chemo infusion, 520 mg (95.1 %  of original dose 541.8 mg), Intravenous,  Once, 2 of 6 cycles Dose modification:   (original dose 541.8 mg, Cycle 1) Administration: 520 mg (07/20/2019) PACLitaxel (TAXOL) 300 mg in sodium chloride 0.9 % 250 mL chemo infusion (> '80mg'$ /m2), 200 mg/m2 = 300 mg, Intravenous,  Once, 2 of 6 cycles Administration: 300 mg (07/20/2019) fosaprepitant (EMEND) 150 mg, dexamethasone (DECADRON) 12 mg in sodium chloride 0.9 % 145 mL IVPB, , Intravenous,  Once, 2 of 6 cycles Administration:  (07/20/2019)  for chemotherapy treatment.      ALLERGIES:  has No Known Allergies.  MEDICATIONS:  Current Outpatient Medications  Medication Sig Dispense Refill  . acetaminophen (TYLENOL) 500 MG tablet Take 500 mg by mouth 2 (two) times daily.     Marland Kitchen ALPRAZolam (XANAX) 0.25 MG tablet Take 1 tablet (0.25 mg total) by mouth at bedtime as needed for anxiety. 30 tablet 0  . apixaban (ELIQUIS) 5 MG TABS tablet Take 1 tablet (5 mg total) by mouth 2 (two) times daily. 60 tablet 3  . loratadine (CLARITIN) 10 MG tablet Take 10 mg by mouth daily.    . methocarbamol (ROBAXIN) 500 MG tablet Take 1 tablet (500 mg total) by mouth 2 (two) times daily as needed for muscle spasms (for lower back pain). 120 tablet 1   No current facility-administered medications for this visit.    Facility-Administered Medications Ordered in Other Visits  Medication Dose Route Frequency Provider Last Rate Last Dose  . CARBOplatin (PARAPLATIN) 520 mg in sodium chloride 0.9 % 250 mL chemo infusion  520 mg Intravenous Once Lloyd Huger, MD      . heparin lock flush 100 unit/mL  500 Units Intracatheter Once PRN Lloyd Huger, MD      . PACLitaxel (TAXOL) 300 mg in sodium chloride 0.9 % 250 mL chemo infusion (> '80mg'$ /m2)  200 mg/m2 (Treatment Plan Recorded) Intravenous Once Lloyd Huger, MD 100 mL/hr at 08/10/19 1036 300 mg at 08/10/19 1036  . pegfilgrastim (NEULASTA ONPRO KIT) injection 6 mg  6 mg Subcutaneous Once Jacquelin Hawking, NP         VITAL SIGNS: There were no vitals taken for this visit. There were no vitals filed for this visit.  Estimated body mass index is 21.77 kg/m as calculated from the following:   Height as of 08/07/19: '5\' 2"'$  (1.575 m).   Weight as of an earlier encounter on 08/10/19: 119 lb (54 kg).  LABS: CBC:    Component Value Date/Time   WBC 4.0 08/10/2019 0813   HGB 8.9 (L) 08/10/2019 0813   HGB 14.5 04/09/2019 0918   HCT 27.9 (L) 08/10/2019 0813   HCT 44.2 04/09/2019 0918   PLT 260 08/10/2019 0813   PLT 401 04/09/2019 0918   MCV 105.7 (H) 08/10/2019 0813   MCV 99 (H) 04/09/2019 0918   NEUTROABS 2.8 08/10/2019 0813   NEUTROABS 5.9 04/09/2019 0918   LYMPHSABS 0.5 (L) 08/10/2019 0813   LYMPHSABS 1.3 04/09/2019 0918   MONOABS 0.7 08/10/2019 0813   EOSABS 0.0 08/10/2019 0813   EOSABS 0.2 04/09/2019 0918   BASOSABS 0.0 08/10/2019 0813   BASOSABS 0.1 04/09/2019 0918   Comprehensive Metabolic Panel:  Component Value Date/Time   NA 139 08/10/2019 0813   NA 138 04/09/2019 0918   K 3.5 08/10/2019 0813   CL 107 08/10/2019 0813   CO2 23 08/10/2019 0813   BUN 20 08/10/2019 0813   BUN 12 04/09/2019 0918   CREATININE 0.80 08/10/2019 0813   GLUCOSE 114 (H) 08/10/2019 0813   CALCIUM 9.1 08/10/2019 0813   AST 33 08/10/2019 0813   ALT 24 08/10/2019 0813   ALKPHOS 104 08/10/2019 0813   BILITOT 0.4 08/10/2019 0813   BILITOT 0.4 04/09/2019 0918   PROT 6.8 08/10/2019 0813   PROT 6.8 04/09/2019 0918   ALBUMIN 3.7 08/10/2019 0813   ALBUMIN 4.0 04/09/2019 0918    RADIOGRAPHIC STUDIES: No results found.  PERFORMANCE STATUS (ECOG) : 1 - Symptomatic but completely ambulatory  Review of Systems Unless otherwise noted, a complete review of systems is negative.  Physical Exam General: NAD, frail appearing, thin Pulmonary: Unlabored Extremities: no edema Skin: no rashes Neurological: Weakness but otherwise nonfocal  IMPRESSION: Routine follow-up visit made today.  Patient was seen in  infusion while she was receiving treatment.  Patient reports that she is doing reasonably well.  She denies any acute changes or concerns today.  She denies any distressing symptoms.  She does report some anxiety from hair loss but says that she has "come to accept it."  She actually plans to go to salon following her treatment today to have her hair shaved.  We discussed her emotional coping.  She says that she feels she is doing okay.  She reports improved insomnia with use of alprazolam at bedtime.  We will plan to continue alprazolam as needed.  Patient reports that pain is stable.  She is continue to use acetaminophen and Robaxin but plans to hold those to see if her pain is stable without use.  I have previously discussed with her ACP documents and a MOST Form, and will need to readdress during future visit when patient can be seen privately in an exam room.  PLAN: -Continue current scope of treatment -ACP documents and most form previously reviewed -Continue alprazolam 0.25 mg nightly as needed -RTC in 3 weeks   Patient expressed understanding and was in agreement with this plan. She also understands that She can call the clinic at any time with any questions, concerns, or complaints.     Time Total: 15 minutes  Visit consisted of counseling and education dealing with the complex and emotionally intense issues of symptom management and palliative care in the setting of serious and potentially life-threatening illness.Greater than 50%  of this time was spent counseling and coordinating care related to the above assessment and plan.  Signed by: Altha Harm, PhD, NP-C [phone] (Work Cell)

## 2019-08-10 NOTE — Progress Notes (Signed)
[place]  Telephone:(336) [phone] Fax:(336) [phone]  ID: Mariah Mcmahon OB: [birth year]  MR#: [id number]  UMP#:[id number]  Patient Care Team: Venita Lick, NP as PCP - General (Nurse Practitioner) Clent Jacks, RN as Oncology Nurse Navigator  CHIEF COMPLAINT: Stage IV cervical cancer  INTERVAL HISTORY: Patient returns to clinic today for further evaluation and consideration of cycle 2 carbo/Taxol.  She tolerated cycle 1 well.  She had 2 days of significant abdominal pain that resolved on its own.  She required on pro-Neulasta.  She had her IVC filter removed on 08/07/19 and is doing well.  Her hair is starting to fall out and she is going to have it cut today. She denied any nausea or vomiting, diarrhea or constipation or urinary complaints.  Her appetite has remained good and she denies any vaginal bleeding.  She continues to be anxious.  REVIEW OF SYSTEMS:   Review of Systems  Constitutional: Negative.  Negative for fever, malaise/fatigue and weight loss.  Respiratory: Negative.  Negative for cough and shortness of breath.   Cardiovascular: Negative.  Negative for chest pain and leg swelling.  Gastrointestinal: Positive for abdominal pain (d1 and day 2 after chemo).  Genitourinary: Negative.  Negative for hematuria.  Musculoskeletal: Negative.  Negative for back pain.  Skin: Negative.  Negative for rash.  Neurological: Negative.  Negative for dizziness, focal weakness, weakness and headaches.  Psychiatric/Behavioral: The patient is nervous/anxious.     As per HPI. Otherwise, a complete review of systems is negative.  PAST MEDICAL HISTORY: Past Medical History:  Diagnosis Date  . Cancer ([place])    cwervical cancer  . Cervical cancer ([place])   . No pertinent past medical history     PAST SURGICAL HISTORY: Past Surgical History:  Procedure Laterality Date  . IVC FILTER REMOVAL N/A 08/07/2019   Procedure: IVC FILTER REMOVAL;  Surgeon: Katha Cabal, MD;  Location: [facility];  Service: Cardiovascular;  Laterality: N/A;  . MASS EXCISION     Throat  . PERIPHERAL VASCULAR THROMBECTOMY Left 06/05/2019   Procedure: PERIPHERAL VASCULAR THROMBECTOMY WITH IVC FILTER (LEFT LOWER EXTREMITY);  Surgeon: Katha Cabal, MD;  Location: [facility];  Service: Cardiovascular;  Laterality: Left;  . PORTA CATH INSERTION N/A 06/06/2019   Procedure: PORTA CATH INSERTION;  Surgeon: Katha Cabal, MD;  Location: [facility];  Service: Cardiovascular;  Laterality: N/A;  . TONSILLECTOMY      FAMILY HISTORY: Family History  Problem Relation Age of Onset  . Brain cancer Mother 62  . Alcohol abuse Father   . Heart disease Father   . Prostate cancer Father   . Heart attack Father   . Diabetes Father     ADVANCED DIRECTIVES (Y/N):  N  HEALTH MAINTENANCE: Social History   Tobacco Use  . Smoking status: Former Smoker    Packs/day: 0.50    Years: 30.00    Pack years: 15.00    Types: Cigarettes    Quit date: 07/03/2019    Years since quitting: 0.1  . Smokeless tobacco: Never Used  Substance Use Topics  . Alcohol use: Not Currently  . Drug use: Never     Colonoscopy:  PAP:  Bone density:  Lipid panel:  No Known Allergies  Current Outpatient Medications  Medication Sig Dispense Refill  . acetaminophen (TYLENOL) 500 MG tablet Take 500 mg by mouth 2 (two) times daily.     Marland Kitchen ALPRAZolam (XANAX) 0.25 MG tablet  Take 1 tablet (0.25 mg total) by mouth at bedtime as needed for anxiety. 30 tablet 0  . apixaban (ELIQUIS) 5 MG TABS tablet Take 1 tablet (5 mg total) by mouth 2 (two) times daily. 60 tablet 3  . loratadine (CLARITIN) 10 MG tablet Take 10 mg by mouth daily.    . methocarbamol (ROBAXIN) 500 MG tablet Take 1 tablet (500 mg total) by mouth 2 (two) times daily as needed for muscle spasms (for lower back pain). 120 tablet 1   Current Facility-Administered Medications  Medication Dose Route Frequency Provider  Last Rate Last Dose  . pegfilgrastim (NEULASTA ONPRO KIT) injection 6 mg  6 mg Subcutaneous Once Jacquelin Hawking, NP       Facility-Administered Medications Ordered in Other Visits  Medication Dose Route Frequency Provider Last Rate Last Dose  . CARBOplatin (PARAPLATIN) 520 mg in sodium chloride 0.9 % 250 mL chemo infusion  520 mg Intravenous Once Lloyd Huger, MD      . famotidine (PEPCID) IVPB 20 mg premix  20 mg Intravenous Once Lloyd Huger, MD 200 mL/hr at 08/10/19 0950 20 mg at 08/10/19 0950  . fosaprepitant (EMEND) 150 mg, dexamethasone (DECADRON) 12 mg in sodium chloride 0.9 % 145 mL IVPB   Intravenous Once Lloyd Huger, MD      . heparin lock flush 100 unit/mL  500 Units Intracatheter Once PRN Lloyd Huger, MD      . PACLitaxel (TAXOL) 300 mg in sodium chloride 0.9 % 250 mL chemo infusion (> '80mg'$ /m2)  200 mg/m2 (Treatment Plan Recorded) Intravenous Once Lloyd Huger, MD        OBJECTIVE: Vitals:   08/10/19 0848  BP: 105/64  Pulse: 86  Temp: (!) 97.2 F (36.2 C)     Body mass index is 21.77 kg/m.    ECOG FS:0 - Asymptomatic  Physical Exam Constitutional:      Appearance: Normal appearance.     Comments: Alopecia  HENT:     Head: Normocephalic and atraumatic.  Eyes:     Pupils: Pupils are equal, round, and reactive to light.  Neck:     Musculoskeletal: Normal range of motion.  Cardiovascular:     Rate and Rhythm: Normal rate and regular rhythm.     Heart sounds: Normal heart sounds. No murmur.  Pulmonary:     Effort: Pulmonary effort is normal.     Breath sounds: Normal breath sounds. No wheezing.  Abdominal:     General: Bowel sounds are normal. There is no distension.     Palpations: Abdomen is soft.     Tenderness: There is no abdominal tenderness.  Musculoskeletal: Normal range of motion.  Skin:    General: Skin is warm and dry.     Findings: No rash.  Neurological:     Mental Status: She is alert and oriented to person,  place, and time.  Psychiatric:        Judgment: Judgment normal.     LAB RESULTS:  Lab Results  Component Value Date   NA 139 08/10/2019   K 3.5 08/10/2019   CL 107 08/10/2019   CO2 23 08/10/2019   GLUCOSE 114 (H) 08/10/2019   BUN 20 08/10/2019   CREATININE 0.80 08/10/2019   CALCIUM 9.1 08/10/2019   PROT 6.8 08/10/2019   ALBUMIN 3.7 08/10/2019   AST 33 08/10/2019   ALT 24 08/10/2019   ALKPHOS 104 08/10/2019   BILITOT 0.4 08/10/2019   GFRNONAA >60 08/10/2019  GFRAA >60 08/10/2019    Lab Results  Component Value Date   WBC 4.0 08/10/2019   NEUTROABS 2.8 08/10/2019   HGB 8.9 (L) 08/10/2019   HCT 27.9 (L) 08/10/2019   MCV 105.7 (H) 08/10/2019   PLT 260 08/10/2019     STUDIES: No results found.  ASSESSMENT: Stage IV cervical cancer.  PLAN:    1.  Stage IV cervical cancer: Biopsy results from April 24, 2019 confirmed the diagnosis. PET scan on July 10, 2019 reviewed independently with persistent metastatic disease.  Patient was also evaluated by gynecology oncology.  She continues to have persistent disease that would be outside the field for brachii therapy.  After further discussion it was decided upon to proceed with systemic chemotherapy using carboplatinum and Taxol every 3 weeks for 4-6 cycles.  Patient's treatment is scheduled for Friday, therefore she will require On-Pro Neulasta support since she will be unable to receive injection over the weekend.  Proceed with cycle 2 of carboplatin and Taxol today.  Patient will then return to clinic in 3 weeks for further evaluation and consideration of cycle 3.  Plan to reimage at the conclusion of cycle 4.   2.  Hydronephrosis: Seen on CT scan.  Monitor closely. 3.  Venous access: Patient now has had a port placed. 4.  Renal insufficiency: Resolved. 5.  Anemia: Patient's hemoglobin is dreaseed but stable at 8.9 .  Monitor closely with active vaginal bleeding and on Eliquis.   6.  Abdominal pain: Patient does not  complain of this today.  Continue hydrocodone as prescribed.  Patient also continues to use meloxicam sparingly. 7.  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  She will require Eliquis for minimum 6 months, but she will likely require extended treatment.  Monitor closely with vaginal bleeding as above.  Had filter removed on 08/07/2019 without incident.   Patient expressed understanding and was in agreement with this plan. She also understands that She can call clinic at any time with any questions, concerns, or complaints.   Cancer Staging Cervical cancer [facility]) Staging form: Cervix Uteri, AJCC 8th Edition - Clinical: FIGO Stage IIIB (cT3b, cN1, cM0) - Signed by Lloyd Huger, MD on 05/09/2019   Jacquelin Hawking, NP 08/10/19 10:01 AM

## 2019-08-22 ENCOUNTER — Other Ambulatory Visit (INDEPENDENT_AMBULATORY_CARE_PROVIDER_SITE_OTHER): Payer: Self-pay | Admitting: Vascular Surgery

## 2019-08-22 DIAGNOSIS — I82412 Acute embolism and thrombosis of left femoral vein: Secondary | ICD-10-CM

## 2019-08-22 DIAGNOSIS — Z95820 Peripheral vascular angioplasty status with implants and grafts: Secondary | ICD-10-CM

## 2019-08-27 ENCOUNTER — Other Ambulatory Visit: Payer: Self-pay

## 2019-08-27 ENCOUNTER — Ambulatory Visit (INDEPENDENT_AMBULATORY_CARE_PROVIDER_SITE_OTHER): Payer: BC Managed Care – PPO | Admitting: Vascular Surgery

## 2019-08-27 ENCOUNTER — Encounter (INDEPENDENT_AMBULATORY_CARE_PROVIDER_SITE_OTHER): Payer: Self-pay | Admitting: Vascular Surgery

## 2019-08-27 ENCOUNTER — Ambulatory Visit (INDEPENDENT_AMBULATORY_CARE_PROVIDER_SITE_OTHER): Payer: BC Managed Care – PPO

## 2019-08-27 VITALS — BP 119/76 | HR 86 | Resp 10 | Ht 62.0 in | Wt 121.0 lb

## 2019-08-27 DIAGNOSIS — I82422 Acute embolism and thrombosis of left iliac vein: Secondary | ICD-10-CM | POA: Diagnosis not present

## 2019-08-27 DIAGNOSIS — I82412 Acute embolism and thrombosis of left femoral vein: Secondary | ICD-10-CM | POA: Diagnosis not present

## 2019-08-27 DIAGNOSIS — Z95820 Peripheral vascular angioplasty status with implants and grafts: Secondary | ICD-10-CM

## 2019-08-27 NOTE — Progress Notes (Signed)
MRN : [id number]  Mariah Mcmahon is a 53 y.o. (April 14, 1966) female who presents with chief complaint of No chief complaint on file. Marland Kitchen  History of Present Illness:   The patient presents to the office for evaluation of DVT.  DVT was identified at [facility] by Duplex ultrasound.  The initial symptoms were pain and swelling in the lower extremity.  Patient presented to [facility] June 05, 2019 with profound swelling and pain of the left lower extremity.  She did undergo mechanical thrombectomy of the femoral veins as well as the iliac veins and a 14 x 60 via Novo stent was placed in the common iliac vein on the left.  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  The patient notes the left leg is not painful at all and has minimal swelling with dependency.  The patient notes minimal edema in the morning which steadily worsens throughout the day.    The patient has not been using compression therapy at this point.  No SOB or pleuritic chest pains.  No cough or hemoptysis.  No blood per rectum or blood in any sputum.  No excessive bruising per the patient.   Venous duplex of the left leg is negative for DVT no significant chronic changes noted.  No outpatient medications have been marked as taking for the 08/27/19 encounter (Appointment) with Delana Meyer, Dolores Lory, MD.    Past Medical History:  Diagnosis Date  . Cancer ([place])    cwervical cancer  . Cervical cancer ([place])   . No pertinent past medical history     Past Surgical History:  Procedure Laterality Date  . IVC FILTER REMOVAL N/A 08/07/2019   Procedure: IVC FILTER REMOVAL;  Surgeon: Katha Cabal, MD;  Location: [place] [facility];  Service: Cardiovascular;  Laterality: N/A;  . MASS EXCISION     Throat  . PERIPHERAL VASCULAR THROMBECTOMY Left 06/05/2019   Procedure: PERIPHERAL VASCULAR THROMBECTOMY WITH IVC FILTER (LEFT LOWER EXTREMITY);  Surgeon: Katha Cabal,  MD;  Location: [place] [facility];  Service: Cardiovascular;  Laterality: Left;  . PORTA CATH INSERTION N/A 06/06/2019   Procedure: PORTA CATH INSERTION;  Surgeon: Katha Cabal, MD;  Location: [facility];  Service: Cardiovascular;  Laterality: N/A;  . TONSILLECTOMY      Social History Social History   Tobacco Use  . Smoking status: Former Smoker    Packs/day: 0.50    Years: 30.00    Pack years: 15.00    Types: Cigarettes    Quit date: 07/03/2019    Years since quitting: 0.1  . Smokeless tobacco: Never Used  Substance Use Topics  . Alcohol use: Not Currently  . Drug use: Never    Family History Family History  Problem Relation Age of Onset  . Brain cancer Mother 30  . Alcohol abuse Father   . Heart disease Father   . Prostate cancer Father   . Heart attack Father   . Diabetes Father     No Known Allergies   REVIEW OF SYSTEMS (Negative unless checked)  Constitutional: [] Weight loss  [] Fever  [] Chills Cardiac: [] Chest pain   [] Chest pressure   [] Palpitations   [] Shortness of breath when laying flat   [] Shortness of breath with exertion. Vascular:  [] Pain in legs with walking   [] Pain in legs at rest  [x] History of DVT   [] Phlebitis   [] Swelling in legs   [] Varicose veins   []   Non-healing ulcers Pulmonary:   [] Uses home oxygen   [] Productive cough   [] Hemoptysis   [] Wheeze  [] COPD   [] Asthma Neurologic:  [] Dizziness   [] Seizures   [] History of stroke   [] History of TIA  [] Aphasia   [] Vissual changes   [] Weakness or numbness in arm   [] Weakness or numbness in leg Musculoskeletal:   [] Joint swelling   [] Joint pain   [] Low back pain Hematologic:  [] Easy bruising  [] Easy bleeding   [] Hypercoagulable state   [] Anemic Gastrointestinal:  [] Diarrhea   [] Vomiting  [] Gastroesophageal reflux/heartburn   [] Difficulty swallowing. Genitourinary:  [] Chronic kidney disease   [] Difficult urination  [] Frequent urination   [] Blood in urine Skin:  [] Rashes   [] Ulcers   Psychological:  [] History of anxiety   []  History of major depression.  Physical Examination  There were no vitals filed for this visit. There is no height or weight on file to calculate BMI. Gen: WD/WN, NAD Head: [place]/AT, No temporalis wasting.  Ear/Nose/Throat: Hearing grossly intact, nares w/o erythema or drainage Eyes: PER, EOMI, sclera nonicteric.  Neck: Supple, no large masses.   Pulmonary:  Good air movement, no audible wheezing bilaterally, no use of accessory muscles.  Cardiac: RRR, no JVD Vascular:   Minimal swelling of the left leg Gastrointestinal: Non-distended. No guarding/no peritoneal signs.  Musculoskeletal: M/S 5/5 throughout.  No deformity or atrophy.  Neurologic: CN 2-12 intact. Symmetrical.  Speech is fluent. Motor exam as listed above. Psychiatric: Judgment intact, Mood & affect appropriate for pt's clinical situation. Dermatologic: No rashes or ulcers noted.  No changes consistent with cellulitis. Lymph : No lichenification or skin changes of chronic lymphedema.  CBC Lab Results  Component Value Date   WBC 4.0 08/10/2019   HGB 8.9 (L) 08/10/2019   HCT 27.9 (L) 08/10/2019   MCV 105.7 (H) 08/10/2019   PLT 260 08/10/2019    BMET    Component Value Date/Time   NA 139 08/10/2019 0813   NA 138 04/09/2019 0918   K 3.5 08/10/2019 0813   CL 107 08/10/2019 0813   CO2 23 08/10/2019 0813   GLUCOSE 114 (H) 08/10/2019 0813   BUN 20 08/10/2019 0813   BUN 12 04/09/2019 0918   CREATININE 0.80 08/10/2019 0813   CALCIUM 9.1 08/10/2019 0813   GFRNONAA >60 08/10/2019 0813   GFRAA >60 08/10/2019 0813   CrCl cannot be calculated (Unknown ideal weight.).  COAG Lab Results  Component Value Date   INR 1.1 06/05/2019    Radiology No results found.   Assessment/Plan 1. Acute deep vein thrombosis (DVT) of iliac vein of left lower extremity ([place]) Patient has done very well status post thrombectomy intervention.  Given that there is a mechanical indication or cause  for her clot I would be inclined to stop her anti-coagulation after she is been treated for her pulmonary embolism.  I would defer the exact length of time to Dr. Grayland Ormond.  I will plan to remove her IVC filter at the end of September.  Risks and benefits were reviewed all questions answered patient agrees to proceed.  - VAS [place] IVC/ILIAC (VENOUS ONLY); Future   Hortencia Pilar, MD  08/27/2019 9:48 AM

## 2019-08-30 NOTE — Progress Notes (Signed)
Patient is coming in for follow up she is doing well she did mention that when she sits up from laying down that she gets a little light headed

## 2019-08-30 NOTE — Progress Notes (Signed)
[place]  Telephone:(336) [phone] Fax:(336) [phone]  ID: Mariah Mcmahon OB: 1966-03-07  MR#: [id number]  [id number]  Patient Care Team: Venita Lick, NP as PCP - General (Nurse Practitioner) Clent Jacks, RN as Oncology Nurse Navigator   CHIEF COMPLAINT: Stage IV cervical cancer  INTERVAL HISTORY: Patient returns to clinic today for further evaluation and consideration of cycle 3 of carboplatinum and Taxol.  She currently feels well and is asymptomatic. She does not complain of any further vaginal bleeding.  She denies any abdominal pain or bloating.  She has no neurologic complaints.  She denies any recent fevers or illnesses.  She has a good appetite and denies weight loss.  She has no chest pain, shortness of breath, cough, or hemoptysis.  She denies any nausea, vomiting, constipation, or diarrhea.  She has no urinary complaints.  Patient offers no specific complaints today.  REVIEW OF SYSTEMS:   Review of Systems  Constitutional: Negative.  Negative for fever, malaise/fatigue and weight loss.  Respiratory: Negative.  Negative for cough and shortness of breath.   Cardiovascular: Negative.  Negative for chest pain and leg swelling.  Gastrointestinal: Negative for abdominal pain.  Genitourinary: Negative.  Negative for hematuria.  Musculoskeletal: Negative.  Negative for back pain.  Skin: Negative.  Negative for rash.  Neurological: Negative.  Negative for dizziness, focal weakness, weakness and headaches.  Psychiatric/Behavioral: Negative.  The patient is not nervous/anxious.     As per HPI. Otherwise, a complete review of systems is negative.  PAST MEDICAL HISTORY: Past Medical History:  Diagnosis Date   Cancer ([place])    cwervical cancer   Cervical cancer ([place])    No pertinent past medical history     PAST SURGICAL HISTORY: Past Surgical History:  Procedure Laterality Date   IVC FILTER REMOVAL N/A 08/07/2019   Procedure: IVC FILTER  REMOVAL;  Surgeon: Katha Cabal, MD;  Location: [place] [facility];  Service: Cardiovascular;  Laterality: N/A;   MASS EXCISION     Throat   PERIPHERAL VASCULAR THROMBECTOMY Left 06/05/2019   Procedure: PERIPHERAL VASCULAR THROMBECTOMY WITH IVC FILTER (LEFT LOWER EXTREMITY);  Surgeon: Katha Cabal, MD;  Location: [place] [facility];  Service: Cardiovascular;  Laterality: Left;   PORTA CATH INSERTION N/A 06/06/2019   Procedure: PORTA CATH INSERTION;  Surgeon: Katha Cabal, MD;  Location: [place] [facility];  Service: Cardiovascular;  Laterality: N/A;   TONSILLECTOMY      FAMILY HISTORY: Family History  Problem Relation Age of Onset   Brain cancer Mother 29   Alcohol abuse Father    Heart disease Father    Prostate cancer Father    Heart attack Father    Diabetes Father     ADVANCED DIRECTIVES (Y/N):  N  HEALTH MAINTENANCE: Social History   Tobacco Use   Smoking status: Former Smoker    Packs/day: 0.50    Years: 30.00    Pack years: 15.00    Types: Cigarettes    Quit date: 07/03/2019    Years since quitting: 0.1   Smokeless tobacco: Never Used  Substance Use Topics   Alcohol use: Not Currently   Drug use: Never     Colonoscopy:  PAP:  Bone density:  Lipid panel:  No Known Allergies  Current Outpatient Medications  Medication Sig Dispense Refill   acetaminophen (TYLENOL) 500 MG tablet Take 500 mg by mouth 2 (two) times daily.      ALPRAZolam (XANAX) 0.25 MG tablet Take  1 tablet (0.25 mg total) by mouth at bedtime as needed for anxiety. 30 tablet 0   apixaban (ELIQUIS) 5 MG TABS tablet Take 1 tablet (5 mg total) by mouth 2 (two) times daily. 60 tablet 3   loratadine (CLARITIN) 10 MG tablet Take 10 mg by mouth daily.     oxybutynin (DITROPAN-XL) 5 MG 24 hr tablet Take 1 tablet (5 mg total) by mouth at bedtime. 30 tablet 0   No current facility-administered medications for this visit.     OBJECTIVE: Vitals:   08/31/19 0836    BP: 113/63  Pulse: 81  Temp: (!) 96.7 F (35.9 C)     Body mass index is 22.53 kg/m.    ECOG FS:0 - Asymptomatic  General: Well-developed, well-nourished, no acute distress. Eyes: Pink conjunctiva, anicteric sclera. HEENT: Normocephalic, moist mucous membranes. Lungs: Clear to auscultation bilaterally. Heart: Regular rate and rhythm. No rubs, murmurs, or gallops. Abdomen: Soft, nontender, nondistended. No organomegaly noted, normoactive bowel sounds. Musculoskeletal: No edema, cyanosis, or clubbing. Neuro: Alert, answering all questions appropriately. Cranial nerves grossly intact. Skin: No rashes or petechiae noted. Psych: Normal affect.  LAB RESULTS:  Lab Results  Component Value Date   NA 136 08/31/2019   K 3.0 (L) 08/31/2019   CL 106 08/31/2019   CO2 22 08/31/2019   GLUCOSE 116 (H) 08/31/2019   BUN 17 08/31/2019   CREATININE 0.78 08/31/2019   CALCIUM 8.7 (L) 08/31/2019   PROT 6.1 (L) 08/31/2019   ALBUMIN 3.4 (L) 08/31/2019   AST 27 08/31/2019   ALT 19 08/31/2019   ALKPHOS 114 08/31/2019   BILITOT 0.5 08/31/2019   GFRNONAA >60 08/31/2019   GFRAA >60 08/31/2019    Lab Results  Component Value Date   WBC 3.9 (L) 08/31/2019   NEUTROABS 3.0 08/31/2019   HGB 8.4 (L) 08/31/2019   HCT 25.9 (L) 08/31/2019   MCV 114.6 (H) 08/31/2019   PLT 118 (L) 08/31/2019     STUDIES: No results found.  ASSESSMENT: Stage IV cervical cancer.  PLAN:    1.  Stage IV cervical cancer: Biopsy results from April 24, 2019 confirmed the diagnosis. PET scan on July 10, 2019 reviewed independently with persistent metastatic disease outside the field for brachii therapy.  Patient was also evaluated by gynecology oncology.  It was decided upon to proceed with systemic chemotherapy using carboplatinum and Taxol every 3 weeks for 4-6 cycles.  Proceed with cycle 3 of carboplatinum and Taxol along with On Pro Neulasta support.  Gynecology oncology has requested PET scan prior to cycle 4.   Return to clinic in 3 weeks for further evaluation, discussion of her imaging results, and consideration of cycle 4.   2.  Hydronephrosis: Seen on CT scan.  Monitor closely.  Repeat imaging as above. 3.  Venous access: Patient now has had a port placed. 4.  Renal insufficiency: Resolved. 5.  Anemia: Hemoglobin is trending down and now 8.4.  Monitor closely while taking Eliquis.  6.  Abdominal pain: Patient does not complain of this today.  Continue hydrocodone as prescribed.  Patient also continues to use meloxicam sparingly. 7.  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  She will require Eliquis for minimum 6 months, but she will likely require extended treatment.  Patient's IVC filter has been removed. 8.  Thrombocytopenia: Mild, monitor.   Patient expressed understanding and was in agreement with this plan. She also understands that She can call clinic at any time with any questions, concerns, or complaints.  Cancer Staging Cervical cancer [facility]) Staging form: Cervix Uteri, AJCC 8th Edition - Clinical: FIGO Stage IIIB (cT3b, cN1, cM0) - Signed by Lloyd Huger, MD on 05/09/2019   Lloyd Huger, MD   09/01/2019 6:41 AM   Addendum: Patient had a PET scan on July 10, 2019 and was also evaluated by gynecology oncology several days later.  Patient continues to have persistent disease that would be outside the field for brachii therapy.  After further discussion it was decided upon to proceed with systemic chemotherapy using carboplatinum, Taxol, and Avastin every 3 weeks for 4-6 cycles.  Patient's treatment is scheduled for Friday, therefore she will require On Pro Neulasta support since she will be unable to receive injection over the weekend.  Return to clinic on July 20, 2019 initiate cycle 1.   Lloyd Huger, MD 09/01/19 6:41 AM

## 2019-08-31 ENCOUNTER — Inpatient Hospital Stay: Payer: BC Managed Care – PPO

## 2019-08-31 ENCOUNTER — Inpatient Hospital Stay (HOSPITAL_BASED_OUTPATIENT_CLINIC_OR_DEPARTMENT_OTHER): Payer: BC Managed Care – PPO | Admitting: Hospice and Palliative Medicine

## 2019-08-31 ENCOUNTER — Other Ambulatory Visit: Payer: Self-pay | Admitting: Nurse Practitioner

## 2019-08-31 ENCOUNTER — Inpatient Hospital Stay: Payer: BC Managed Care – PPO | Admitting: *Deleted

## 2019-08-31 ENCOUNTER — Inpatient Hospital Stay (HOSPITAL_BASED_OUTPATIENT_CLINIC_OR_DEPARTMENT_OTHER): Payer: BC Managed Care – PPO | Admitting: Oncology

## 2019-08-31 ENCOUNTER — Other Ambulatory Visit: Payer: Self-pay

## 2019-08-31 VITALS — BP 113/63 | HR 81 | Temp 96.7°F | Wt 123.2 lb

## 2019-08-31 DIAGNOSIS — C539 Malignant neoplasm of cervix uteri, unspecified: Secondary | ICD-10-CM | POA: Diagnosis not present

## 2019-08-31 DIAGNOSIS — Z5189 Encounter for other specified aftercare: Secondary | ICD-10-CM | POA: Diagnosis not present

## 2019-08-31 DIAGNOSIS — Z95828 Presence of other vascular implants and grafts: Secondary | ICD-10-CM

## 2019-08-31 DIAGNOSIS — Z515 Encounter for palliative care: Secondary | ICD-10-CM | POA: Diagnosis not present

## 2019-08-31 DIAGNOSIS — Z5111 Encounter for antineoplastic chemotherapy: Secondary | ICD-10-CM | POA: Diagnosis not present

## 2019-08-31 LAB — CBC WITH DIFFERENTIAL/PLATELET
Abs Immature Granulocytes: 0.02 10*3/uL (ref 0.00–0.07)
Basophils Absolute: 0 10*3/uL (ref 0.0–0.1)
Basophils Relative: 1 %
Eosinophils Absolute: 0 10*3/uL (ref 0.0–0.5)
Eosinophils Relative: 1 %
HCT: 25.9 % — ABNORMAL LOW (ref 36.0–46.0)
Hemoglobin: 8.4 g/dL — ABNORMAL LOW (ref 12.0–15.0)
Immature Granulocytes: 1 %
Lymphocytes Relative: 9 %
Lymphs Abs: 0.4 10*3/uL — ABNORMAL LOW (ref 0.7–4.0)
MCH: 37.2 pg — ABNORMAL HIGH (ref 26.0–34.0)
MCHC: 32.4 g/dL (ref 30.0–36.0)
MCV: 114.6 fL — ABNORMAL HIGH (ref 80.0–100.0)
Monocytes Absolute: 0.5 10*3/uL (ref 0.1–1.0)
Monocytes Relative: 12 %
Neutro Abs: 3 10*3/uL (ref 1.7–7.7)
Neutrophils Relative %: 76 %
Platelets: 118 10*3/uL — ABNORMAL LOW (ref 150–400)
RBC: 2.26 MIL/uL — ABNORMAL LOW (ref 3.87–5.11)
RDW: 18.9 % — ABNORMAL HIGH (ref 11.5–15.5)
WBC: 3.9 10*3/uL — ABNORMAL LOW (ref 4.0–10.5)
nRBC: 0 % (ref 0.0–0.2)

## 2019-08-31 LAB — COMPREHENSIVE METABOLIC PANEL
ALT: 19 U/L (ref 0–44)
AST: 27 U/L (ref 15–41)
Albumin: 3.4 g/dL — ABNORMAL LOW (ref 3.5–5.0)
Alkaline Phosphatase: 114 U/L (ref 38–126)
Anion gap: 8 (ref 5–15)
BUN: 17 mg/dL (ref 6–20)
CO2: 22 mmol/L (ref 22–32)
Calcium: 8.7 mg/dL — ABNORMAL LOW (ref 8.9–10.3)
Chloride: 106 mmol/L (ref 98–111)
Creatinine, Ser: 0.78 mg/dL (ref 0.44–1.00)
GFR calc Af Amer: >60 mL/min (ref >60)
GFR calc non Af Amer: >60 mL/min (ref >60)
Glucose, Bld: 116 mg/dL — ABNORMAL HIGH (ref 70–99)
Potassium: 3 mmol/L — ABNORMAL LOW (ref 3.5–5.1)
Sodium: 136 mmol/L (ref 135–145)
Total Bilirubin: 0.5 mg/dL (ref 0.3–1.2)
Total Protein: 6.1 g/dL — ABNORMAL LOW (ref 6.5–8.1)

## 2019-08-31 LAB — PROTEIN, URINE, RANDOM: Total Protein, Urine: 20 mg/dL

## 2019-08-31 LAB — SAMPLE TO BLOOD BANK

## 2019-08-31 MED ORDER — HEPARIN SOD (PORK) LOCK FLUSH 100 UNIT/ML IV SOLN
500.0000 [IU] | Freq: Once | INTRAVENOUS | Status: AC | PRN
  Administered 2019-08-31: 500 [IU]
  Filled 2019-08-31: qty 5

## 2019-08-31 MED ORDER — SODIUM CHLORIDE 0.9 % IV SOLN
Freq: Once | INTRAVENOUS | Status: AC
  Administered 2019-08-31: 10:00:00 via INTRAVENOUS
  Filled 2019-08-31: qty 250

## 2019-08-31 MED ORDER — SODIUM CHLORIDE 0.9% FLUSH
10.0000 mL | Freq: Once | INTRAVENOUS | Status: AC
  Administered 2019-08-31: 08:00:00 10 mL via INTRAVENOUS
  Filled 2019-08-31: qty 10

## 2019-08-31 MED ORDER — PALONOSETRON HCL INJECTION 0.25 MG/5ML
0.2500 mg | Freq: Once | INTRAVENOUS | Status: AC
  Administered 2019-08-31: 0.25 mg via INTRAVENOUS
  Filled 2019-08-31: qty 5

## 2019-08-31 MED ORDER — DIPHENHYDRAMINE HCL 50 MG/ML IJ SOLN
25.0000 mg | Freq: Once | INTRAMUSCULAR | Status: AC
  Administered 2019-08-31: 10:00:00 25 mg via INTRAVENOUS
  Filled 2019-08-31: qty 1

## 2019-08-31 MED ORDER — SODIUM CHLORIDE 0.9 % IV SOLN
200.0000 mg/m2 | Freq: Once | INTRAVENOUS | Status: AC
  Administered 2019-08-31: 300 mg via INTRAVENOUS
  Filled 2019-08-31: qty 50

## 2019-08-31 MED ORDER — PEGFILGRASTIM 6 MG/0.6ML ~~LOC~~ PSKT
6.0000 mg | PREFILLED_SYRINGE | Freq: Once | SUBCUTANEOUS | Status: AC
  Administered 2019-08-31: 6 mg via SUBCUTANEOUS
  Filled 2019-08-31: qty 0.6

## 2019-08-31 MED ORDER — SODIUM CHLORIDE 0.9 % IV SOLN
Freq: Once | INTRAVENOUS | Status: AC
  Administered 2019-08-31: 11:00:00 via INTRAVENOUS
  Filled 2019-08-31: qty 1000

## 2019-08-31 MED ORDER — SODIUM CHLORIDE 0.9 % IV SOLN
Freq: Once | INTRAVENOUS | Status: AC
  Administered 2019-08-31: 11:00:00 via INTRAVENOUS
  Filled 2019-08-31: qty 5

## 2019-08-31 MED ORDER — OXYBUTYNIN CHLORIDE ER 5 MG PO TB24: 5.0000 mg | ORAL_TABLET | Freq: Every day | ORAL | 0 refills | Status: DC

## 2019-08-31 MED ORDER — SODIUM CHLORIDE 0.9 % IV SOLN
520.0000 mg | Freq: Once | INTRAVENOUS | Status: AC
  Administered 2019-08-31: 520 mg via INTRAVENOUS
  Filled 2019-08-31: qty 52

## 2019-08-31 MED ORDER — FAMOTIDINE IN NACL 20-0.9 MG/50ML-% IV SOLN
20.0000 mg | Freq: Once | INTRAVENOUS | Status: AC
  Administered 2019-08-31: 20 mg via INTRAVENOUS
  Filled 2019-08-31: qty 50

## 2019-08-31 NOTE — Progress Notes (Signed)
Discussed PET between cycle 3 and 4 with Mariah Mcmahon. Orders placed. We reviewed prep for PET. She will obtain the date and time from her my chart as well as gyn onc follow up.

## 2019-08-31 NOTE — Progress Notes (Signed)
[facility]  [phone] [phone]   Name: Mariah Mcmahon Date: 08/31/2019 MRN: [id number]  DOB: 01-Nov-1966  Patient Care Team: Venita Lick, NP as PCP - General (Nurse Practitioner) Clent Jacks, RN as Oncology Nurse Navigator    REASON FOR CONSULTATION: Palliative Care consult requested for this 53 y.o. 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  Patient is felt not to be a surgical candidate and treatment was initiated with concurrent chemoradiation.  Patient was hospitalized 06/05/2019 -06/07/2019 with acute left lower extremity DVT/pulmonary embolism status post thrombolysis and IVC filter placement.  IVC filter was later removed.  Patient was referred to palliative care to help address goals and manage ongoing symptoms.   SOCIAL HISTORY:     reports that she quit smoking about 8 weeks ago. Her smoking use included cigarettes. She has a 15.00 pack-year smoking history. She has never used smokeless tobacco. She reports previous alcohol use. She reports that she does not use drugs.  Patient is not married.  She lives at home alone.  She has a son who is involved in her care, who lives nearby.  Patient previously worked at [employer] in [place] and recently stopped working due to [employer].  ADVANCE DIRECTIVES:  Does not have  CODE STATUS:   PAST MEDICAL HISTORY: Past Medical History:  Diagnosis Date  . Cancer ([place])    cwervical cancer  . Cervical cancer ([place])   . No pertinent past medical history     PAST SURGICAL HISTORY:  Past Surgical History:  Procedure Laterality Date  . IVC FILTER REMOVAL N/A 08/07/2019   Procedure: IVC FILTER REMOVAL;  Surgeon: Katha Cabal, MD;  Location: [facility];  Service: Cardiovascular;  Laterality: N/A;  . MASS EXCISION     Throat   . PERIPHERAL VASCULAR THROMBECTOMY Left 06/05/2019   Procedure: PERIPHERAL VASCULAR THROMBECTOMY WITH IVC FILTER (LEFT LOWER EXTREMITY);  Surgeon: Katha Cabal, MD;  Location: [facility];  Service: Cardiovascular;  Laterality: Left;  . PORTA CATH INSERTION N/A 06/06/2019   Procedure: PORTA CATH INSERTION;  Surgeon: Katha Cabal, MD;  Location: [facility];  Service: Cardiovascular;  Laterality: N/A;  . TONSILLECTOMY      HEMATOLOGY/ONCOLOGY HISTORY:  Oncology History  Cervical cancer ([place])  05/04/2019 Initial Diagnosis   Cervical cancer ([place])   05/09/2019 Cancer Staging   Staging form: Cervix Uteri, AJCC 8th Edition - Clinical: FIGO Stage IIIB (cT3b, cN1, cM0) - Signed by Lloyd Huger, MD on 05/09/2019   05/17/2019 - 07/04/2019 Chemotherapy   The patient had palonosetron (ALOXI) injection 0.25 mg, 0.25 mg, Intravenous,  Once, 6 of 7 cycles Administration: 0.25 mg (05/17/2019), 0.25 mg (05/24/2019), 0.25 mg (05/31/2019), 0.25 mg (06/14/2019), 0.25 mg (06/22/2019), 0.25 mg (06/28/2019) CISplatin (PLATINOL) 61 mg in sodium chloride 0.9 % 250 mL chemo infusion, 40 mg/m2 = 61 mg, Intravenous,  Once, 6 of 7 cycles Administration: 61 mg (05/17/2019), 61 mg (05/24/2019), 61 mg (05/31/2019), 61 mg (06/14/2019), 61 mg (06/22/2019), 61 mg (06/28/2019) fosaprepitant (EMEND) 150 mg, dexamethasone (DECADRON) 12 mg in sodium chloride 0.9 % 145 mL IVPB, , Intravenous,  Once, 6 of 7 cycles Administration:  (05/17/2019),  (05/24/2019),  (05/31/2019),  (06/14/2019),  (06/22/2019),  (06/28/2019)  for chemotherapy treatment.    07/20/2019 -  Chemotherapy   The patient had palonosetron ([place])  injection 0.25 mg, 0.25 mg, Intravenous,  Once, 2 of 6 cycles Administration: 0.25 mg (07/20/2019), 0.25 mg (08/10/2019) pegfilgrastim (NEULASTA ONPRO KIT) injection 6 mg, 6 mg, Subcutaneous, Once, 1 of 1 cycle Administration: 6 mg (07/20/2019) CARBOplatin (PARAPLATIN) 520 mg in sodium chloride 0.9 % 250 mL chemo  infusion, 520 mg (95.1 % of original dose 541.8 mg), Intravenous,  Once, 2 of 6 cycles Dose modification:   (original dose 541.8 mg, Cycle 1) Administration: 520 mg (07/20/2019), 520 mg (08/10/2019) PACLitaxel (TAXOL) 300 mg in sodium chloride 0.9 % 250 mL chemo infusion (> 52m/m2), 200 mg/m2 = 300 mg, Intravenous,  Once, 2 of 6 cycles Administration: 300 mg (07/20/2019), 300 mg (08/10/2019) fosaprepitant (EMEND) 150 mg, dexamethasone (DECADRON) 12 mg in sodium chloride 0.9 % 145 mL IVPB, , Intravenous,  Once, 2 of 6 cycles Administration:  (07/20/2019),  (08/10/2019)  for chemotherapy treatment.      ALLERGIES:  has No Known Allergies.  MEDICATIONS:  Current Outpatient Medications  Medication Sig Dispense Refill  . acetaminophen (TYLENOL) 500 MG tablet Take 500 mg by mouth 2 (two) times daily.     .Marland KitchenALPRAZolam (XANAX) 0.25 MG tablet Take 1 tablet (0.25 mg total) by mouth at bedtime as needed for anxiety. 30 tablet 0  . apixaban (ELIQUIS) 5 MG TABS tablet Take 1 tablet (5 mg total) by mouth 2 (two) times daily. 60 tablet 3  . loratadine (CLARITIN) 10 MG tablet Take 10 mg by mouth daily.     No current facility-administered medications for this visit.     VITAL SIGNS: There were no vitals taken for this visit. There were no vitals filed for this visit.  Estimated body mass index is 22.53 kg/m as calculated from the following:   Height as of 08/27/19: 5' 2" (1.575 m).   Weight as of an earlier encounter on 08/31/19: 123 lb 3.2 oz (55.9 kg).  LABS: CBC:    Component Value Date/Time   WBC 3.9 (L) 08/31/2019 0815   HGB 8.4 (L) 08/31/2019 0815   HGB 14.5 04/09/2019 0918   HCT 25.9 (L) 08/31/2019 0815   HCT 44.2 04/09/2019 0918   PLT 118 (L) 08/31/2019 0815   PLT 401 04/09/2019 0918   MCV 114.6 (H) 08/31/2019 0815   MCV 99 (H) 04/09/2019 0918   NEUTROABS 3.0 08/31/2019 0815   NEUTROABS 5.9 04/09/2019 0918   LYMPHSABS 0.4 (L) 08/31/2019 0815   LYMPHSABS 1.3 04/09/2019 0918    MONOABS 0.5 08/31/2019 0815   EOSABS 0.0 08/31/2019 0815   EOSABS 0.2 04/09/2019 0918   BASOSABS 0.0 08/31/2019 0815   BASOSABS 0.1 04/09/2019 0918   Comprehensive Metabolic Panel:    Component Value Date/Time   NA 136 08/31/2019 0815   NA 138 04/09/2019 0918   K 3.0 (L) 08/31/2019 0815   CL 106 08/31/2019 0815   CO2 22 08/31/2019 0815   BUN 17 08/31/2019 0815   BUN 12 04/09/2019 0918   CREATININE 0.78 08/31/2019 0815   GLUCOSE 116 (H) 08/31/2019 0815   CALCIUM 8.7 (L) 08/31/2019 0815   AST 27 08/31/2019 0815   ALT 19 08/31/2019 0815   ALKPHOS 114 08/31/2019 0815   BILITOT 0.5 08/31/2019 0815   BILITOT 0.4 04/09/2019 0918   PROT 6.1 (L) 08/31/2019 0815   PROT 6.8 04/09/2019 0918   ALBUMIN 3.4 (L) 08/31/2019 0815   ALBUMIN 4.0 04/09/2019 0918    RADIOGRAPHIC STUDIES: No results found.  PERFORMANCE STATUS (ECOG) : 1 - Symptomatic but completely ambulatory  Review of Systems Unless otherwise noted, a complete review of systems is negative.  Physical Exam General: NAD, frail appearing, thin Pulmonary: Unlabored Extremities: no edema Skin: no rashes Neurological: Weakness but otherwise nonfocal  IMPRESSION: Routine follow-up visit made today.    Patient reports that she is doing well.  She denies any acute changes or concerns today.  She also denies any distressing symptoms.  She reports overall improved pain.  She continues to take acetaminophen twice daily but plans to stop that to see if her pain is stable without it.  Patient reports stable anxiety.  She feels she is coping reasonably well with her illness.  I have previously discussed with her ACP documents and a MOST Form, and will need to readdress during future visit.  Case discussed with Dr. Grayland Ormond.  PLAN: -Continue current scope of treatment -ACP documents and most form previously reviewed -Continue alprazolam 0.25 mg nightly as needed -RTC in 3 weeks   Patient expressed understanding and was in  agreement with this plan. She also understands that She can call the clinic at any time with any questions, concerns, or complaints.     Time Total: 10 minutes  Visit consisted of counseling and education dealing with the complex and emotionally intense issues of symptom management and palliative care in the setting of serious and potentially life-threatening illness.Greater than 50%  of this time was spent counseling and coordinating care related to the above assessment and plan.  Signed by: Altha Harm, PhD, NP-C [phone] (Work Cell)

## 2019-08-31 NOTE — Progress Notes (Signed)
Per MD, keep current carboplatin dose with increase in weight.

## 2019-09-03 ENCOUNTER — Other Ambulatory Visit: Payer: Self-pay | Admitting: Emergency Medicine

## 2019-09-03 DIAGNOSIS — C539 Malignant neoplasm of cervix uteri, unspecified: Secondary | ICD-10-CM

## 2019-09-03 MED ORDER — APIXABAN 5 MG PO TABS: 5.0000 mg | ORAL_TABLET | Freq: Two times a day (BID) | ORAL | 1 refills | Status: AC

## 2019-09-04 ENCOUNTER — Encounter: Payer: Self-pay | Admitting: Oncology

## 2019-09-07 ENCOUNTER — Inpatient Hospital Stay: Payer: BC Managed Care – PPO | Admitting: Nurse Practitioner

## 2019-09-07 ENCOUNTER — Encounter: Payer: Self-pay | Admitting: Nurse Practitioner

## 2019-09-14 NOTE — Progress Notes (Deleted)
[facility]  Telephone:([phone] [phone]  ID: Mariah Mcmahon OB: [birth year]  MR#: [id number]  [id number]  Patient Care Team: Venita Lick, NP as PCP - General (Nurse Practitioner) Clent Jacks, RN as Oncology Nurse Navigator   CHIEF COMPLAINT: Stage IV cervical cancer  INTERVAL HISTORY: Patient returns to clinic today for further evaluation and consideration of cycle 3 of carboplatinum and Taxol.  She currently feels well and is asymptomatic. She does not complain of any further vaginal bleeding.  She denies any abdominal pain or bloating.  She has no neurologic complaints.  She denies any recent fevers or illnesses.  She has a good appetite and denies weight loss.  She has no chest pain, shortness of breath, cough, or hemoptysis.  She denies any nausea, vomiting, constipation, or diarrhea.  She has no urinary complaints.  Patient offers no specific complaints today.  REVIEW OF SYSTEMS:   Review of Systems  Constitutional: Negative.  Negative for fever, malaise/fatigue and weight loss.  Respiratory: Negative.  Negative for cough and shortness of breath.   Cardiovascular: Negative.  Negative for chest pain and leg swelling.  Gastrointestinal: Negative for abdominal pain.  Genitourinary: Negative.  Negative for hematuria.  Musculoskeletal: Negative.  Negative for back pain.  Skin: Negative.  Negative for rash.  Neurological: Negative.  Negative for dizziness, focal weakness, weakness and headaches.  Psychiatric/Behavioral: Negative.  The patient is not nervous/anxious.     As per HPI. Otherwise, a complete review of systems is negative.  PAST MEDICAL HISTORY: Past Medical History:  Diagnosis Date  . Cancer ([place])    cwervical cancer  . Cervical cancer ([place])   . No pertinent past medical history     PAST SURGICAL HISTORY: Past Surgical History:  Procedure Laterality Date  . IVC FILTER REMOVAL N/A 08/07/2019   Procedure: IVC FILTER  REMOVAL;  Surgeon: Katha Cabal, MD;  Location: [facility];  Service: Cardiovascular;  Laterality: N/A;  . MASS EXCISION     Throat  . PERIPHERAL VASCULAR THROMBECTOMY Left 06/05/2019   Procedure: PERIPHERAL VASCULAR THROMBECTOMY WITH IVC FILTER (LEFT LOWER EXTREMITY);  Surgeon: Katha Cabal, MD;  Location: [facility];  Service: Cardiovascular;  Laterality: Left;  . PORTA CATH INSERTION N/A 06/06/2019   Procedure: PORTA CATH INSERTION;  Surgeon: Katha Cabal, MD;  Location: [facility];  Service: Cardiovascular;  Laterality: N/A;  . TONSILLECTOMY      FAMILY HISTORY: Family History  Problem Relation Age of Onset  . Brain cancer Mother 66  . Alcohol abuse Father   . Heart disease Father   . Prostate cancer Father   . Heart attack Father   . Diabetes Father     ADVANCED DIRECTIVES (Y/N):  N  HEALTH MAINTENANCE: Social History   Tobacco Use  . Smoking status: Former Smoker    Packs/day: 0.50    Years: 30.00    Pack years: 15.00    Types: Cigarettes    Quit date: 07/03/2019    Years since quitting: 0.2  . Smokeless tobacco: Never Used  Substance Use Topics  . Alcohol use: Not Currently  . Drug use: Never     Colonoscopy:  PAP:  Bone density:  Lipid panel:  No Known Allergies  Current Outpatient Medications  Medication Sig Dispense Refill  . acetaminophen (TYLENOL) 500 MG tablet Take 500 mg by mouth 2 (two) times daily.     Marland Kitchen ALPRAZolam (XANAX) 0.25 MG tablet Take  1 tablet (0.25 mg total) by mouth at bedtime as needed for anxiety. 30 tablet 0  . apixaban (ELIQUIS) 5 MG TABS tablet Take 1 tablet (5 mg total) by mouth 2 (two) times daily. 180 tablet 1  . loratadine (CLARITIN) 10 MG tablet Take 10 mg by mouth daily.    Marland Kitchen oxybutynin (DITROPAN-XL) 5 MG 24 hr tablet Take 1 tablet (5 mg total) by mouth at bedtime. 30 tablet 0   No current facility-administered medications for this visit.     OBJECTIVE: There were no vitals  filed for this visit.   There is no height or weight on file to calculate BMI.    ECOG FS:0 - Asymptomatic  General: Well-developed, well-nourished, no acute distress. Eyes: Pink conjunctiva, anicteric sclera. HEENT: Normocephalic, moist mucous membranes. Lungs: Clear to auscultation bilaterally. Heart: Regular rate and rhythm. No rubs, murmurs, or gallops. Abdomen: Soft, nontender, nondistended. No organomegaly noted, normoactive bowel sounds. Musculoskeletal: No edema, cyanosis, or clubbing. Neuro: Alert, answering all questions appropriately. Cranial nerves grossly intact. Skin: No rashes or petechiae noted. Psych: Normal affect.  LAB RESULTS:  Lab Results  Component Value Date   NA 136 08/31/2019   K 3.0 (L) 08/31/2019   CL 106 08/31/2019   CO2 22 08/31/2019   GLUCOSE 116 (H) 08/31/2019   BUN 17 08/31/2019   CREATININE 0.78 08/31/2019   CALCIUM 8.7 (L) 08/31/2019   PROT 6.1 (L) 08/31/2019   ALBUMIN 3.4 (L) 08/31/2019   AST 27 08/31/2019   ALT 19 08/31/2019   ALKPHOS 114 08/31/2019   BILITOT 0.5 08/31/2019   GFRNONAA >60 08/31/2019   GFRAA >60 08/31/2019    Lab Results  Component Value Date   WBC 3.9 (L) 08/31/2019   NEUTROABS 3.0 08/31/2019   HGB 8.4 (L) 08/31/2019   HCT 25.9 (L) 08/31/2019   MCV 114.6 (H) 08/31/2019   PLT 118 (L) 08/31/2019     STUDIES: Vas [place] Lower Extremity Venous (dvt)  Result Date: 09/06/2019  Lower Venous Study Risk Factors: DVT DVT post procedure x 2 weeks. Comparison Study: 06/18/2019 Performing Technologist: Charlane Ferretti RT (R)(VS)  Examination Guidelines: A complete evaluation includes B-mode imaging, spectral Doppler, color Doppler, and power Doppler as needed of all accessible portions of each vessel. Bilateral testing is considered an integral part of a complete examination. Limited examinations for reoccurring indications may be performed as noted.  +---------+---------------+---------+-----------+----------+--------------+ LEFT      CompressibilityPhasicitySpontaneityPropertiesThrombus Aging +---------+---------------+---------+-----------+----------+--------------+ CFV      Full                                                        +---------+---------------+---------+-----------+----------+--------------+ SFJ      Full                                                        +---------+---------------+---------+-----------+----------+--------------+ FV Prox  Full                                                        +---------+---------------+---------+-----------+----------+--------------+  FV Mid   Full                                                        +---------+---------------+---------+-----------+----------+--------------+ FV DistalFull                                                        +---------+---------------+---------+-----------+----------+--------------+ POP      Full                                                        +---------+---------------+---------+-----------+----------+--------------+ PTV      Full                                                        +---------+---------------+---------+-----------+----------+--------------+ PERO     Full                                                        +---------+---------------+---------+-----------+----------+--------------+ Gastroc  Full                                                        +---------+---------------+---------+-----------+----------+--------------+ GSV      Full                                                        +---------+---------------+---------+-----------+----------+--------------+ SSV      Full                                                        +---------+---------------+---------+-----------+----------+--------------+  Summary: Left: There is no evidence of deep vein thrombosis in the lower extremity.There is no evidence of superficial venous  thrombosis.  *See table(s) above for measurements and observations. Electronically signed by Hortencia Pilar MD on 09/06/2019 at 9:00:35 AM.    Final     ASSESSMENT: Stage IV cervical cancer.  PLAN:    1.  Stage IV cervical cancer: Biopsy results from April 24, 2019 confirmed the diagnosis. PET scan on July 10, 2019 reviewed independently with persistent metastatic disease outside the field for brachii therapy.  Patient was also evaluated by gynecology oncology.  It was decided upon to proceed  with systemic chemotherapy using carboplatinum and Taxol every 3 weeks for 4-6 cycles.  Proceed with cycle 3 of carboplatinum and Taxol along with On Pro Neulasta support.  Gynecology oncology has requested PET scan prior to cycle 4.  Return to clinic in 3 weeks for further evaluation, discussion of her imaging results, and consideration of cycle 4.   2.  Hydronephrosis: Seen on CT scan.  Monitor closely.  Repeat imaging as above. 3.  Venous access: Patient now has had a port placed. 4.  Renal insufficiency: Resolved. 5.  Anemia: Hemoglobin is trending down and now 8.4.  Monitor closely while taking Eliquis.  6.  Abdominal pain: Patient does not complain of this today.  Continue hydrocodone as prescribed.  Patient also continues to use meloxicam sparingly. 7.  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  She will require Eliquis for minimum 6 months, but she will likely require extended treatment.  Patient's IVC filter has been removed. 8.  Thrombocytopenia: Mild, monitor.   Patient expressed understanding and was in agreement with this plan. She also understands that She can call clinic at any time with any questions, concerns, or complaints.   Cancer Staging Cervical cancer [facility]) Staging form: Cervix Uteri, AJCC 8th Edition - Clinical: FIGO Stage IIIB (cT3b, cN1, cM0) - Signed by Lloyd Huger, MD on 05/09/2019   Lloyd Huger, MD   09/14/2019 2:48 PM   Addendum: Patient  had a PET scan on July 10, 2019 and was also evaluated by gynecology oncology several days later.  Patient continues to have persistent disease that would be outside the field for brachii therapy.  After further discussion it was decided upon to proceed with systemic chemotherapy using carboplatinum, Taxol, and Avastin every 3 weeks for 4-6 cycles.  Patient's treatment is scheduled for Friday, therefore she will require On Pro Neulasta support since she will be unable to receive injection over the weekend.  Return to clinic on July 20, 2019 initiate cycle 1.   Lloyd Huger, MD 09/14/19 2:48 PM

## 2019-09-17 ENCOUNTER — Other Ambulatory Visit: Payer: Self-pay

## 2019-09-17 ENCOUNTER — Encounter
Admission: RE | Admit: 2019-09-17 | Discharge: 2019-09-17 | Disposition: A | Discharge status: Home / Self Care | Payer: BC Managed Care – PPO | Source: Ambulatory Visit | Attending: Nurse Practitioner | Admitting: Nurse Practitioner

## 2019-09-17 DIAGNOSIS — C539 Malignant neoplasm of cervix uteri, unspecified: Secondary | ICD-10-CM | POA: Diagnosis not present

## 2019-09-17 DIAGNOSIS — C760 Malignant neoplasm of head, face and neck: Secondary | ICD-10-CM | POA: Diagnosis not present

## 2019-09-17 LAB — GLUCOSE, CAPILLARY: Glucose-Capillary: 104 mg/dL — ABNORMAL HIGH (ref 70–99)

## 2019-09-17 MED ORDER — FLUDEOXYGLUCOSE F - 18 (FDG) INJECTION
6.9400 | Freq: Once | INTRAVENOUS | Status: AC | PRN
  Administered 2019-09-17: 6.94 via INTRAVENOUS

## 2019-09-18 ENCOUNTER — Other Ambulatory Visit: Payer: Self-pay

## 2019-09-18 NOTE — Progress Notes (Signed)
Patient pre screened for office appointment, no questions or concerns today. Patient reminded of upcoming appointment time and date. 

## 2019-09-19 ENCOUNTER — Other Ambulatory Visit: Payer: Self-pay

## 2019-09-19 ENCOUNTER — Inpatient Hospital Stay: Payer: BC Managed Care – PPO | Attending: Oncology | Admitting: Obstetrics and Gynecology

## 2019-09-19 ENCOUNTER — Telehealth: Payer: Self-pay

## 2019-09-19 ENCOUNTER — Other Ambulatory Visit: Payer: Self-pay | Admitting: Nurse Practitioner

## 2019-09-19 ENCOUNTER — Inpatient Hospital Stay (HOSPITAL_BASED_OUTPATIENT_CLINIC_OR_DEPARTMENT_OTHER): Payer: BC Managed Care – PPO | Admitting: Hospice and Palliative Medicine

## 2019-09-19 ENCOUNTER — Inpatient Hospital Stay (HOSPITAL_BASED_OUTPATIENT_CLINIC_OR_DEPARTMENT_OTHER): Payer: BC Managed Care – PPO | Admitting: Oncology

## 2019-09-19 ENCOUNTER — Inpatient Hospital Stay: Payer: BC Managed Care – PPO

## 2019-09-19 VITALS — BP 137/97 | HR 98 | Temp 97.7°F | Resp 16 | Wt 120.1 lb

## 2019-09-19 DIAGNOSIS — F419 Anxiety disorder, unspecified: Secondary | ICD-10-CM

## 2019-09-19 DIAGNOSIS — Z79899 Other long term (current) drug therapy: Secondary | ICD-10-CM | POA: Diagnosis not present

## 2019-09-19 DIAGNOSIS — C539 Malignant neoplasm of cervix uteri, unspecified: Secondary | ICD-10-CM

## 2019-09-19 DIAGNOSIS — G893 Neoplasm related pain (acute) (chronic): Secondary | ICD-10-CM | POA: Diagnosis not present

## 2019-09-19 DIAGNOSIS — Z9221 Personal history of antineoplastic chemotherapy: Secondary | ICD-10-CM | POA: Diagnosis not present

## 2019-09-19 DIAGNOSIS — D696 Thrombocytopenia, unspecified: Secondary | ICD-10-CM | POA: Insufficient documentation

## 2019-09-19 DIAGNOSIS — D509 Iron deficiency anemia, unspecified: Secondary | ICD-10-CM | POA: Diagnosis not present

## 2019-09-19 DIAGNOSIS — N131 Hydronephrosis with ureteral stricture, not elsewhere classified: Secondary | ICD-10-CM | POA: Insufficient documentation

## 2019-09-19 DIAGNOSIS — Z87891 Personal history of nicotine dependence: Secondary | ICD-10-CM | POA: Insufficient documentation

## 2019-09-19 DIAGNOSIS — Z515 Encounter for palliative care: Secondary | ICD-10-CM | POA: Diagnosis not present

## 2019-09-19 DIAGNOSIS — E876 Hypokalemia: Secondary | ICD-10-CM | POA: Diagnosis not present

## 2019-09-19 DIAGNOSIS — Z7189 Other specified counseling: Secondary | ICD-10-CM

## 2019-09-19 DIAGNOSIS — R590 Localized enlarged lymph nodes: Secondary | ICD-10-CM | POA: Diagnosis not present

## 2019-09-19 DIAGNOSIS — Z7901 Long term (current) use of anticoagulants: Secondary | ICD-10-CM | POA: Diagnosis not present

## 2019-09-19 DIAGNOSIS — N1330 Unspecified hydronephrosis: Secondary | ICD-10-CM

## 2019-09-19 DIAGNOSIS — A630 Anogenital (venereal) warts: Secondary | ICD-10-CM | POA: Insufficient documentation

## 2019-09-19 DIAGNOSIS — Z86718 Personal history of other venous thrombosis and embolism: Secondary | ICD-10-CM | POA: Diagnosis not present

## 2019-09-19 LAB — CBC WITH DIFFERENTIAL/PLATELET
Abs Immature Granulocytes: 0.02 10*3/uL (ref 0.00–0.07)
Basophils Absolute: 0 10*3/uL (ref 0.0–0.1)
Basophils Relative: 1 %
Eosinophils Absolute: 0 10*3/uL (ref 0.0–0.5)
Eosinophils Relative: 1 %
HCT: 28.6 % — ABNORMAL LOW (ref 36.0–46.0)
Hemoglobin: 9.5 g/dL — ABNORMAL LOW (ref 12.0–15.0)
Immature Granulocytes: 1 %
Lymphocytes Relative: 8 %
Lymphs Abs: 0.4 10*3/uL — ABNORMAL LOW (ref 0.7–4.0)
MCH: 38.9 pg — ABNORMAL HIGH (ref 26.0–34.0)
MCHC: 33.2 g/dL (ref 30.0–36.0)
MCV: 117.2 fL — ABNORMAL HIGH (ref 80.0–100.0)
Monocytes Absolute: 0.4 10*3/uL (ref 0.1–1.0)
Monocytes Relative: 10 %
Neutro Abs: 3.5 10*3/uL (ref 1.7–7.7)
Neutrophils Relative %: 79 %
Platelets: 137 10*3/uL — ABNORMAL LOW (ref 150–400)
RBC: 2.44 MIL/uL — ABNORMAL LOW (ref 3.87–5.11)
RDW: 14.3 % (ref 11.5–15.5)
WBC: 4.4 10*3/uL (ref 4.0–10.5)
nRBC: 0 % (ref 0.0–0.2)

## 2019-09-19 LAB — SAMPLE TO BLOOD BANK

## 2019-09-19 LAB — COMPREHENSIVE METABOLIC PANEL
ALT: 25 U/L (ref 0–44)
AST: 35 U/L (ref 15–41)
Albumin: 3.9 g/dL (ref 3.5–5.0)
Alkaline Phosphatase: 172 U/L — ABNORMAL HIGH (ref 38–126)
Anion gap: 8 (ref 5–15)
BUN: 19 mg/dL (ref 6–20)
CO2: 24 mmol/L (ref 22–32)
Calcium: 9.1 mg/dL (ref 8.9–10.3)
Chloride: 105 mmol/L (ref 98–111)
Creatinine, Ser: 1.11 mg/dL — ABNORMAL HIGH (ref 0.44–1.00)
GFR calc Af Amer: >60 mL/min (ref >60)
GFR calc non Af Amer: 57 mL/min — ABNORMAL LOW (ref >60)
Glucose, Bld: 109 mg/dL — ABNORMAL HIGH (ref 70–99)
Potassium: 4 mmol/L (ref 3.5–5.1)
Sodium: 137 mmol/L (ref 135–145)
Total Bilirubin: 0.4 mg/dL (ref 0.3–1.2)
Total Protein: 7.3 g/dL (ref 6.5–8.1)

## 2019-09-19 MED ORDER — ALPRAZOLAM 0.25 MG PO TABS: 0.2500 mg | ORAL_TABLET | Freq: Two times a day (BID) | ORAL | 0 refills | Status: DC | PRN

## 2019-09-19 MED ORDER — OXYCODONE HCL 5 MG PO TABS: 2.5000 mg | ORAL_TABLET | Freq: Four times a day (QID) | ORAL | 0 refills | Status: DC | PRN

## 2019-09-19 NOTE — Progress Notes (Signed)
Gynecologic Oncology Interval Visit   Referring Provider: Dr. Georgianne Fick  Chief Complaint: Malignant Neoplasm of Endocervix  Subjective:  Mariah Mcmahon is a 53 y.o. G28P0011 female, diagnosed with stage IIIB cervical cancer, initially seen in consultation from Dr. Georgianne Fick, who returns to clinic for re-evaluation and discussion of imaging results.   Since she was last seen she has completed 3 additional cycles of therapy with carboplatin and paclitaxel. Last dose 08/31/2019.   07/20/2019-08/31/2019- carbo (AUC 6)-paclitaxel ('200mg'$ /m2)  09/17/2019- PET- progression of hypermetabolic nodal disease in chest/abdomen/pelvis. New hypermetabolic disease in right vaginal fornix/cervix.  NM PET Image Restag (PS) Skull Base To Thigh CLINICAL DATA:  Subsequent treatment strategy for cervical cancer.  EXAM: NUCLEAR MEDICINE PET SKULL BASE TO THIGH  TECHNIQUE: 6.9 mCi F-18 FDG was injected intravenously. Full-ring PET imaging was performed from the skull base to thigh after the radiotracer. CT data was obtained and used for attenuation correction and anatomic localization.  Fasting blood glucose: 104 mg/dl  COMPARISON:  07/10/2019  FINDINGS: Mediastinal blood pool activity: SUV max 2.2  Liver activity: SUV max NA  NECK: No hypermetabolic lymph nodes in the neck.  Incidental CT findings: none  CHEST: Hypermetabolic left supraclavicular lymph nodes are similar with SUV max = 5.5 today compared 8.0 previously.  Bilateral hilar hypermetabolism has progressed, with SUV max = 9.0 on the right today compared to 2.8 previously.  Hypermetabolic AP window lymph node is new in the interval with SUV max = 7.4 today.  Hypermetabolic precarinal node has progressed with SUV max = right  Incidental CT findings: Stable 3 mm right upper lobe nodule (88/3). No new suspicious pulmonary nodule or mass. Port-A-Cath tip is positioned in the right atrium. No suspicious pulmonary nodule or mass. There is  some atelectasis in the dependent lung bases bilaterally.  ABDOMEN/PELVIS: New hypermetabolic lesion posterior right liver demonstrates SUV max = 7.5. No underlying CT correlate.  Interval progression of hypermetabolic para-aortic lymphadenopathy in the abdomen left para-aortic node is 6.1 cm today and is probably the same node that was measured with SUV max = 3.1 previously. Small retrocaval node on 147/3 demonstrates SUV max = 4.2.  Hypermetabolic left pelvic sidewall lymph node is new in the interval with SUV max = 5.0.  New hypermetabolic foci identified in the region of the right vaginal fornix/cervix demonstrates SUV max = 12.4  Incidental CT findings: There is abdominal aortic atherosclerosis without aneurysm. Left common iliac vascular stent device noted. IVC filter seen previously has been removed in the interval.  SKELETON: Relatively diffuse marrow uptake may be related to stimulatory effects of therapy.  Incidental CT findings: Similar appearance of the subtle heterogeneous sclerotic lesion in the left sacrum.  IMPRESSION: 1. Interval progression of hypermetabolic nodal disease in the chest, abdomen, and pelvis. Lymph nodes are not well demonstrated on noncontrast CT images. 2. New hypermetabolic disease in the region of the right vaginal fornix/cervix compatible with disease progression.  Electronically Signed   By: Misty Stanley M.D.   On: 09/17/2019 13:44    She suffered several small pulmonary emboli in late July 2020 along with extensive acute occlusive DVT in left common femoral vein through the left popliteal veins. She is stable on anticoagulation.   Gynecologic Oncology History:  Initially presented to PCP for abnormal uterine bleeding, pelvic and back pain.  Lumbar spine x-ray was normal.  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  She reported heavy and painful irregular bleeding.  Pain is present outside  of menses.  She is a smoker  with 15-pack-year history.  Denies history of abnormal Paps but prior results not available for review.  She presented to Dr. Georgianne Fick on 04/24/2019.  On exam, barrel-shaped with mass-effect of left portion of cervix, cervix grossly enlarged, friable with contact bleeding colposcopy was performed.  Urine pregnancy test was negative.    Diagnosis: 1.  Cervix, biopsy, 12:00 -High-grade squamous intraepithelial lesion, CIN-3 2.  Cervix, biopsy, 3:00 -High-grade squamous intraepithelial lesion, CIN-3 3.  Cervix, biopsy, 5:00 -High-grade squamous intraepithelial lesion, CIN-3 with foci suspicious for early stromal microinvasion 4.  Endocervix, curettage -High-grade squamous intraepithelial lesion, CIN-3  HIV screening-non-reactive (04/09/2019)  05/02/2019- CT C/A/P 1. Heterogeneous enlargement of the uterus and cervix with apparent soft tissue thickening in the upper vagina. Cervical and vaginal tissues not well evaluated by CT. 2. Bulky retroperitoneal lymphadenopathy in the abdomen with bilateral common iliac and pelvic sidewall lymphadenopathy in the pelvis. Imaging features consistent with metastatic disease. 3. 5 cm soft tissue lesion to the left of the bladder is consistent with a metastatic deposit. 4. Mild to moderate right hydroureteronephrosis with decreased perfusion to the right kidney. Right ureteral obstruction is at the level of the right pelvic sidewall. 5. 2.2 cm mixed lytic and lucent lesion in the left sacrum, indeterminate, but metastatic disease not excluded.  6. Several 3-4 mm nodules identified in the lungs. Close attention on follow-up recommended as metastatic disease not excluded.  7. Small volume ascites.  05/07/2019- PET- Initial for staging IMPRESSION: 1. Hypermetabolic mass in the cervix compatible with known primary cervical malignancy. Hypermetabolism throughout the enlarged lower uterine segment and body of the uterus compatible with malignant involvement of the  uterus. 2. Hypermetabolic right internal and external iliac, bilateral common iliac, aortocaval, left para-aortic and bilateral retrocrural lymphadenopathy compatible with metastatic nodal disease. 3. Hypermetabolic soft tissue implant in the anterior left pelvis surrounded by small volume pelvic ascites, favor pelvic peritoneal metastasis. 4. No hypermetabolic osseous metastatic disease. No hypermetabolism associated with the mixed lytic and sclerotic upper left sacral lesion, which is probably benign. 5. Tiny bilateral scattered solid pulmonary nodules, below PET resolution, indeterminate, recommend attention on close chest CT follow-up in 3 months. 6.  Aortic Atherosclerosis (ICD10-I70.0).  05/17/2019-06/28/2019- cisplatin & radiation   PET- 07/10/2019- mixed response; new hypermetabolic left supraclavicular, paratracheal, and subcarinal lymph nodes compatible with nodal progression in chest and neck. Decreased hypermetabolism within retroperitoneal nodal metastases. Complete resolution of hypermetabolic activity within cervix, uterus, and low left pelvic peritoneal mets.   Problem List: Patient Active Problem List   Diagnosis Date Noted  . DVT of lower limb, acute ([place]) 06/05/2019  . Iron deficiency anemia due to chronic blood loss 05/24/2019  . Goals of care, counseling/discussion 05/12/2019  . Cervical cancer ([place]) 05/04/2019  . Generalized abdominal pain 04/09/2019  . Acute midline low back pain without sciatica 04/09/2019  . Nicotine dependence, cigarettes, uncomplicated 29/52/8413    Past Medical History: Past Medical History:  Diagnosis Date  . Cancer ([place])    cwervical cancer  . Cervical cancer ([place])   . No pertinent past medical history     Past Surgical History: Past Surgical History:  Procedure Laterality Date  . IVC FILTER REMOVAL N/A 08/07/2019   Procedure: IVC FILTER REMOVAL;  Surgeon: Katha Cabal, MD;  Location: [facility];  Service: Cardiovascular;   Laterality: N/A;  . MASS EXCISION     Throat  . PERIPHERAL VASCULAR THROMBECTOMY Left 06/05/2019   Procedure: PERIPHERAL  VASCULAR THROMBECTOMY WITH IVC FILTER (LEFT LOWER EXTREMITY);  Surgeon: Katha Cabal, MD;  Location: [facility];  Service: Cardiovascular;  Laterality: Left;  . PORTA CATH INSERTION N/A 06/06/2019   Procedure: PORTA CATH INSERTION;  Surgeon: Katha Cabal, MD;  Location: [facility];  Service: Cardiovascular;  Laterality: N/A;  . TONSILLECTOMY      Past Gynecologic History:  History of abnormal Paps: Per HPI Contraception: Sexually active: Not currently Menarche: 8th grade Details: 5 days  History of OCP/HRT use:   OB History:  OB History  Gravida Para Term Preterm AB Living  '2 1     1 1  '$ SAB TAB Ectopic Multiple Live Births               # Outcome Date GA Lbr Len/2nd Weight Sex Delivery Anes PTL Lv  2 AB           1 Para             Family History: Family History  Problem Relation Age of Onset  . Brain cancer Mother 93  . Alcohol abuse Father   . Heart disease Father   . Prostate cancer Father   . Heart attack Father   . Diabetes Father     Social History: Social History   Socioeconomic History  . Marital status: Divorced    Spouse name: Not on file  . Number of children: Not on file  . Years of education: Not on file  . Highest education level: Not on file  Occupational History  . Occupation: [employer]    Employer: [employer]  . Financial resource strain: Not hard at all  . Food insecurity    Worry: Never true    Inability: Never true  . Transportation needs    Medical: No    Non-medical: No  Tobacco Use  . Smoking status: Former Smoker    Packs/day: 0.50    Years: 30.00    Pack years: 15.00    Types: Cigarettes    Quit date: 07/03/2019    Years since quitting: 0.2  . Smokeless tobacco: Never Used  Substance and Sexual Activity  . Alcohol use: Not Currently  . Drug use:  Never  . Sexual activity: Yes    Comment: not at moment  Lifestyle  . Physical activity    Days per week: 6 days    Minutes per session: 40 min  . Stress: Not at all  Relationships  . Social connections    Talks on phone: More than three times a week    Gets together: More than three times a week    Attends religious service: Never    Active member of club or organization: No    Attends meetings of clubs or organizations: Never    Relationship status: Divorced  . Intimate partner violence    Fear of current or ex partner: No    Emotionally abused: No    Physically abused: No    Forced sexual activity: No  Other Topics Concern  . Not on file  Social History Narrative  . Not on file    Allergies: No Known Allergies  Current Medications: Current Outpatient Medications  Medication Sig Dispense Refill  . acetaminophen (TYLENOL) 500 MG tablet Take 500 mg by mouth 2 (two) times daily.     Marland Kitchen ALPRAZolam (XANAX) 0.25 MG tablet Take 1 tablet (0.25 mg total) by mouth at bedtime as needed for  anxiety. 30 tablet 0  . apixaban (ELIQUIS) 5 MG TABS tablet Take 1 tablet (5 mg total) by mouth 2 (two) times daily. 180 tablet 1  . loratadine (CLARITIN) 10 MG tablet Take 10 mg by mouth daily.    Marland Kitchen oxybutynin (DITROPAN-XL) 5 MG 24 hr tablet Take 1 tablet (5 mg total) by mouth at bedtime. (Patient not taking: Reported on 09/19/2019) 30 tablet 0   No current facility-administered medications for this visit.    Review of Systems General:  no complaints Skin: no complaints Eyes: no complaints HEENT: no complaints Breasts: no complaints Pulmonary: no complaints Cardiac: no complaints Gastrointestinal: abdominal pain, bloating/distention Genitourinary/Sexual: bladder issues Ob/Gyn: vaginal spotting/pelvic pain Musculoskeletal: back pain Right side especially Hematology: no complaints Neurologic/Psych: weakness; occasionally feeling "faint"  Objective:  Physical Examination:  Today's  Vitals   09/19/19 1037  BP: (!) 137/97  Pulse: 98  Resp: 16  Temp: 97.7 F (36.5 C)  TempSrc: Tympanic  Weight: 120 lb 1.6 oz (54.5 kg)  PainSc: 0-No pain   Body mass index is 21.97 kg/m.  BP (!) 137/97 (BP Location: Left Arm, Patient Position: Sitting)   Pulse 98   Temp 97.7 F (36.5 C) (Tympanic)   Resp 16   Wt 120 lb 1.6 oz (54.5 kg)   BMI 21.97 kg/m     ECOG Performance Status: 1 - Symptomatic but completely ambulatory  GENERAL: Patient is a well appearing female in no acute distress HEENT:  Sclera clear. Anicteric NODES:  Negative axillary, supraclavicular, inguinal lymph node survery LUNGS:  Clear to auscultation bilaterally.   HEART:  Regular rate and rhythm.  ABDOMEN:  Soft, nontender.  No hernias, incisions well healed. No masses or ascites EXTREMITIES:  No peripheral edema. Atraumatic. No cyanosis SKIN:  Clear with no obvious rashes or skin changes.  NEURO:  Nonfocal. Well oriented.  Appropriate affect.  Pelvic: Exam Chaperoned by NP EGBUS: no lesions Vagina: no gross lesions noted. No vaginal bleeding. On palpation tenderness at the right vaginal fornix but not definite mass/nodularity.  Cervix: Anterior lip of cervix is normal/posterior lip cervix irregular and firm, but no exophytic tumor. The cervix is slightly enlarged but no expanded as noted on prior exam.  Uterus: Globular 12 week size and prominent anteriorly about 2 FB above the pubic bone.   Bimanual/RV: Very firm hard cervix; no adnexal masses, parametria smooth Rectovaginal: not performed  Lab Review Lab Results  Component Value Date   WBC 3.9 (L) 08/31/2019   HGB 8.4 (L) 08/31/2019   HCT 25.9 (L) 08/31/2019   MCV 114.6 (H) 08/31/2019   PLT 118 (L) 08/31/2019     Chemistry      Component Value Date/Time   NA 136 08/31/2019 0815   NA 138 04/09/2019 0918   K 3.0 (L) 08/31/2019 0815   CL 106 08/31/2019 0815   CO2 22 08/31/2019 0815   BUN 17 08/31/2019 0815   BUN 12 04/09/2019 0918    CREATININE 0.78 08/31/2019 0815      Component Value Date/Time   CALCIUM 8.7 (L) 08/31/2019 0815   ALKPHOS 114 08/31/2019 0815   AST 27 08/31/2019 0815   ALT 19 08/31/2019 0815   BILITOT 0.5 08/31/2019 0815   BILITOT 0.4 04/09/2019 0918     Repeat labs ordered and T&C.   Radiologic Imaging: As per HPI and interval history    Assessment:  Mariah Mcmahon is a 53 y.o. 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  There is 2.2 cm mixed lytic and lucent lesion in the left sacrum, indeterminate, but metastatic disease not excluded.  Several 3-4 mm nodules identified in the lungs.  Also has area in the sacrum concerning for metastatic disease.Cervical biopsy only suggestive of microinvasion, but biopsy surely just was not deep enough as the tumor is all submucosal and up in the cervix. CT scan clearly shows advanced cervical cancer. S/p   Pain symptoms due to malignancy, suboptimal control  History of right hydronephrosis noted with some decreased function  Symptomatic anemia.   Thrombocytopenia  Hypokalemia  Medical co-morbidities complicating care: Marland Kitchen  Plan:   Problem List Items Addressed This Visit      Genitourinary   Cervical cancer ([place]) - Primary     Discussed PET scan results. Additional biopsy will be needed to assess for TMB, MSI, PDL1 to determine if pembrolizumab would be an option. Another alternative is participate on the LIO-1 clinical trial. To be on study she will need to have a biopsy to confirm malignancy and this will also allow for tumor marker evaluation. Additional options include topotecan, bevacizumab, premetrexed and we will anticipate FDA approval for TIL therapy and tisotumab vedotin next year.   Pain symptoms due to malignancy: She is taking Tylenol 500 mg 2 tabs q 12 hours for pain and declines oxycodone. She is very sensitive to  narcotics and does not like the side effects. We will reach out to Billey Chang, NP regarding pain control optimization.   History of right hydronephrosis noted with some decreased function -  radiology to review new films. If significant hydronephrosis and functional kidney will refer to Dr Erlene Quan in Urology for evaluation.   Symptomatic anemia - Discussed with Dr. Grayland Ormond and his team will arrange transfusion.   Thrombocytopenia, asymptomatic.   Hypokalemia - arrange of potassium supplementation. Dr. Grayland Ormond aware.   The patient's diagnosis, an outline of the further diagnostic and laboratory studies which will be required, the recommendation for surgery, and alternatives were discussed with her and her accompanying family members.  All questions were answered to their satisfaction.  A total of 40 minutes were spent with the patient/family today; 40% was spent in education, counseling and coordination of care for cervical cancer.  Beckey Rutter, NP scribed the note. I performed the history, physical, and counseling.   [place] Gaetana Michaelis, MD   CC:  Dr. Georgianne Fick

## 2019-09-19 NOTE — Progress Notes (Signed)
[place]  [phone] [phone]   Name: Mariah Mcmahon Date: 09/19/2019 MRN: [id number]  DOB: 28-Jan-1966  Patient Care Team: Venita Lick, NP as PCP - General (Nurse Practitioner) Clent Jacks, RN as Oncology Nurse Navigator    REASON FOR CONSULTATION: Palliative Care consult requested for this 53 y.o. 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  Patient is felt not to be a surgical candidate and treatment was initiated with concurrent chemoradiation.  Patient was hospitalized 06/05/2019 -06/07/2019 with acute left lower extremity DVT/pulmonary embolism status post thrombolysis and IVC filter placement.  IVC filter was later removed.  Patient had disease progression on carbo Taxol and is being referred to [facility] for a clinical trial.  Patient was referred to palliative care to help address goals and manage ongoing symptoms.   SOCIAL HISTORY:     reports that she quit smoking about 2 months ago. Her smoking use included cigarettes. She has a 15.00 pack-year smoking history. She has never used smokeless tobacco. She reports previous alcohol use. She reports that she does not use drugs.  Patient is not married.  She lives at home alone.  She has a son who is involved in her care, who lives nearby.  Patient previously worked at [employer] in [place] and recently stopped working due to [employer].  ADVANCE DIRECTIVES:  Does not have  CODE STATUS:   PAST MEDICAL HISTORY: Past Medical History:  Diagnosis Date   Cancer ([place])    cwervical cancer   Cervical cancer ([place])    No pertinent past medical history     PAST SURGICAL HISTORY:  Past Surgical History:  Procedure Laterality Date   IVC FILTER REMOVAL N/A 08/07/2019   Procedure: IVC FILTER REMOVAL;  Surgeon: Katha Cabal, MD;   Location: [place] [facility];  Service: Cardiovascular;  Laterality: N/A;   MASS EXCISION     Throat   PERIPHERAL VASCULAR THROMBECTOMY Left 06/05/2019   Procedure: PERIPHERAL VASCULAR THROMBECTOMY WITH IVC FILTER (LEFT LOWER EXTREMITY);  Surgeon: Katha Cabal, MD;  Location: [place] [facility];  Service: Cardiovascular;  Laterality: Left;   PORTA CATH INSERTION N/A 06/06/2019   Procedure: PORTA CATH INSERTION;  Surgeon: Katha Cabal, MD;  Location: [place] [facility];  Service: Cardiovascular;  Laterality: N/A;   TONSILLECTOMY      HEMATOLOGY/ONCOLOGY HISTORY:  Oncology History  Cervical cancer ([place])  05/04/2019 Initial Diagnosis   Cervical cancer ([place])   05/09/2019 Cancer Staging   Staging form: Cervix Uteri, AJCC 8th Edition - Clinical: FIGO Stage IIIB (cT3b, cN1, cM0) - Signed by Lloyd Huger, MD on 05/09/2019   05/17/2019 - 07/04/2019 Chemotherapy   The patient had palonosetron (ALOXI) injection 0.25 mg, 0.25 mg, Intravenous,  Once, 6 of 7 cycles Administration: 0.25 mg (05/17/2019), 0.25 mg (05/24/2019), 0.25 mg (05/31/2019), 0.25 mg (06/14/2019), 0.25 mg (06/22/2019), 0.25 mg (06/28/2019) CISplatin (PLATINOL) 61 mg in sodium chloride 0.9 % 250 mL chemo infusion, 40 mg/m2 = 61 mg, Intravenous,  Once, 6 of 7 cycles Administration: 61 mg (05/17/2019), 61 mg (05/24/2019), 61 mg (05/31/2019), 61 mg (06/14/2019), 61 mg (06/22/2019), 61 mg (06/28/2019) fosaprepitant (EMEND) 150 mg, dexamethasone (DECADRON) 12 mg in sodium chloride 0.9 % 145 mL IVPB, , Intravenous,  Once, 6 of 7 cycles Administration:  (05/17/2019),  (05/24/2019),  (05/31/2019),  (06/14/2019),  (06/22/2019),  (06/28/2019)  for chemotherapy treatment.    07/20/2019 -  Chemotherapy   The patient had palonosetron (ALOXI) injection 0.25 mg, 0.25 mg, Intravenous,  Once, 3 of 6 cycles Administration: 0.25 mg (07/20/2019), 0.25 mg (08/10/2019), 0.25 mg (08/31/2019) pegfilgrastim (NEULASTA ONPRO KIT) injection 6 mg, 6 mg,  Subcutaneous, Once, 2 of 5 cycles Administration: 6 mg (07/20/2019), 6 mg (08/31/2019) CARBOplatin (PARAPLATIN) 520 mg in sodium chloride 0.9 % 250 mL chemo infusion, 520 mg (95.1 % of original dose 541.8 mg), Intravenous,  Once, 3 of 6 cycles Dose modification:   (original dose 541.8 mg, Cycle 1) Administration: 520 mg (07/20/2019), 520 mg (08/10/2019), 520 mg (08/31/2019) PACLitaxel (TAXOL) 300 mg in sodium chloride 0.9 % 250 mL chemo infusion (> '80mg'$ /m2), 200 mg/m2 = 300 mg, Intravenous,  Once, 3 of 6 cycles Administration: 300 mg (07/20/2019), 300 mg (08/10/2019), 300 mg (08/31/2019) fosaprepitant (EMEND) 150 mg, dexamethasone (DECADRON) 12 mg in sodium chloride 0.9 % 145 mL IVPB, , Intravenous,  Once, 3 of 6 cycles Administration:  (07/20/2019),  (08/10/2019),  (08/31/2019)  for chemotherapy treatment.      ALLERGIES:  has No Known Allergies.  MEDICATIONS:  Current Outpatient Medications  Medication Sig Dispense Refill   acetaminophen (TYLENOL) 500 MG tablet Take 500 mg by mouth 2 (two) times daily.      ALPRAZolam (XANAX) 0.25 MG tablet Take 1 tablet (0.25 mg total) by mouth 2 (two) times daily as needed for anxiety. 45 tablet 0   apixaban (ELIQUIS) 5 MG TABS tablet Take 1 tablet (5 mg total) by mouth 2 (two) times daily. 180 tablet 1   loratadine (CLARITIN) 10 MG tablet Take 10 mg by mouth daily.     oxybutynin (DITROPAN-XL) 5 MG 24 hr tablet Take 1 tablet (5 mg total) by mouth at bedtime. (Patient not taking: Reported on 09/19/2019) 30 tablet 0   oxyCODONE (OXY IR/ROXICODONE) 5 MG immediate release tablet Take 0.5-1 tablets (2.5-5 mg total) by mouth every 6 (six) hours as needed for severe pain. 30 tablet 0   No current facility-administered medications for this visit.     VITAL SIGNS: There were no vitals taken for this visit. There were no vitals filed for this visit.  Estimated body mass index is 21.97 kg/m as calculated from the following:   Height as of 08/27/19: '5\' 2"'$   (1.575 m).   Weight as of an earlier encounter on 09/19/19: 120 lb 1.6 oz (54.5 kg).  LABS: CBC:    Component Value Date/Time   WBC 3.9 (L) 08/31/2019 0815   HGB 8.4 (L) 08/31/2019 0815   HGB 14.5 04/09/2019 0918   HCT 25.9 (L) 08/31/2019 0815   HCT 44.2 04/09/2019 0918   PLT 118 (L) 08/31/2019 0815   PLT 401 04/09/2019 0918   MCV 114.6 (H) 08/31/2019 0815   MCV 99 (H) 04/09/2019 0918   NEUTROABS 3.0 08/31/2019 0815   NEUTROABS 5.9 04/09/2019 0918   LYMPHSABS 0.4 (L) 08/31/2019 0815   LYMPHSABS 1.3 04/09/2019 0918   MONOABS 0.5 08/31/2019 0815   EOSABS 0.0 08/31/2019 0815   EOSABS 0.2 04/09/2019 0918   BASOSABS 0.0 08/31/2019 0815   BASOSABS 0.1 04/09/2019 0918   Comprehensive Metabolic Panel:    Component Value Date/Time   NA 136 08/31/2019 0815   NA 138 04/09/2019 0918   K 3.0 (L) 08/31/2019 0815   CL 106 08/31/2019 0815   CO2 22 08/31/2019 0815   BUN 17 08/31/2019 0815   BUN 12 04/09/2019 0918   CREATININE 0.78 08/31/2019  0815   GLUCOSE 116 (H) 08/31/2019 0815   CALCIUM 8.7 (L) 08/31/2019 0815   AST 27 08/31/2019 0815   ALT 19 08/31/2019 0815   ALKPHOS 114 08/31/2019 0815   BILITOT 0.5 08/31/2019 0815   BILITOT 0.4 04/09/2019 0918   PROT 6.1 (L) 08/31/2019 0815   PROT 6.8 04/09/2019 0918   ALBUMIN 3.4 (L) 08/31/2019 0815   ALBUMIN 4.0 04/09/2019 0918    RADIOGRAPHIC STUDIES: Nm Pet Image Restag (ps) Skull Base To Thigh  Result Date: 09/17/2019 CLINICAL DATA:  Subsequent treatment strategy for cervical cancer. EXAM: NUCLEAR MEDICINE PET SKULL BASE TO THIGH TECHNIQUE: 6.9 mCi F-18 FDG was injected intravenously. Full-ring PET imaging was performed from the skull base to thigh after the radiotracer. CT data was obtained and used for attenuation correction and anatomic localization. Fasting blood glucose: 104 mg/dl COMPARISON:  07/10/2019 FINDINGS: Mediastinal blood pool activity: SUV max 2.2 Liver activity: SUV max NA NECK: No hypermetabolic lymph nodes in the  neck. Incidental CT findings: none CHEST: Hypermetabolic left supraclavicular lymph nodes are similar with SUV max = 5.5 today compared 8.0 previously. Bilateral hilar hypermetabolism has progressed, with SUV max = 9.0 on the right today compared to 2.8 previously. Hypermetabolic AP window lymph node is new in the interval with SUV max = 7.4 today. Hypermetabolic precarinal node has progressed with SUV max = right Incidental CT findings: Stable 3 mm right upper lobe nodule (88/3). No new suspicious pulmonary nodule or mass. Port-A-Cath tip is positioned in the right atrium. No suspicious pulmonary nodule or mass. There is some atelectasis in the dependent lung bases bilaterally. ABDOMEN/PELVIS: New hypermetabolic lesion posterior right liver demonstrates SUV max = 7.5. No underlying CT correlate. Interval progression of hypermetabolic para-aortic lymphadenopathy in the abdomen left para-aortic node is 6.1 cm today and is probably the same node that was measured with SUV max = 3.1 previously. Small retrocaval node on 147/3 demonstrates SUV max = 4.2. Hypermetabolic left pelvic sidewall lymph node is new in the interval with SUV max = 5.0. New hypermetabolic foci identified in the region of the right vaginal fornix/cervix demonstrates SUV max = 12.4 Incidental CT findings: There is abdominal aortic atherosclerosis without aneurysm. Left common iliac vascular stent device noted. IVC filter seen previously has been removed in the interval. SKELETON: Relatively diffuse marrow uptake may be related to stimulatory effects of therapy. Incidental CT findings: Similar appearance of the subtle heterogeneous sclerotic lesion in the left sacrum. IMPRESSION: 1. Interval progression of hypermetabolic nodal disease in the chest, abdomen, and pelvis. Lymph nodes are not well demonstrated on noncontrast CT images. 2. New hypermetabolic disease in the region of the right vaginal fornix/cervix compatible with disease progression.  Electronically Signed   By: Misty Stanley M.D.   On: 09/17/2019 13:44   Vas [place] Lower Extremity Venous (dvt)  Result Date: 09/06/2019  Lower Venous Study Risk Factors: DVT DVT post procedure x 2 weeks. Comparison Study: 06/18/2019 Performing Technologist: Charlane Ferretti RT (R)(VS)  Examination Guidelines: A complete evaluation includes B-mode imaging, spectral Doppler, color Doppler, and power Doppler as needed of all accessible portions of each vessel. Bilateral testing is considered an integral part of a complete examination. Limited examinations for reoccurring indications may be performed as noted.  +---------+---------------+---------+-----------+----------+--------------+  LEFT      Compressibility Phasicity Spontaneity Properties Thrombus Aging  +---------+---------------+---------+-----------+----------+--------------+  CFV       Full                                                             +---------+---------------+---------+-----------+----------+--------------+  SFJ       Full                                                             +---------+---------------+---------+-----------+----------+--------------+  FV Prox   Full                                                             +---------+---------------+---------+-----------+----------+--------------+  FV Mid    Full                                                             +---------+---------------+---------+-----------+----------+--------------+  FV Distal Full                                                             +---------+---------------+---------+-----------+----------+--------------+  POP       Full                                                             +---------+---------------+---------+-----------+----------+--------------+  PTV       Full                                                             +---------+---------------+---------+-----------+----------+--------------+  PERO      Full                                                              +---------+---------------+---------+-----------+----------+--------------+  Gastroc   Full                                                             +---------+---------------+---------+-----------+----------+--------------+  GSV       Full                                                             +---------+---------------+---------+-----------+----------+--------------+  SSV       Full                                                             +---------+---------------+---------+-----------+----------+--------------+  Summary: Left: There is no evidence of deep vein thrombosis in the lower extremity.There is no evidence of superficial venous thrombosis.  *See table(s) above for measurements and observations. Electronically signed by Hortencia Pilar MD on 09/06/2019 at 9:00:35 AM.    Final     PERFORMANCE STATUS (ECOG) : 1 - Symptomatic but completely ambulatory  Review of Systems Unless otherwise noted, a complete review of systems is negative.  Physical Exam General: NAD, frail appearing, thin Pulmonary: Unlabored Extremities: no edema Skin: no rashes Neurological: Weakness but otherwise nonfocal  IMPRESSION: Patient was an add-on to my schedule today for evaluation and treatment of pain.  Patient had PET on 09/17/2019, which revealed interval progression of hypermetabolic nodal disease in chest, abdomen, and pelvis with new hypermetabolic disease in the region of the right vaginal fornix/cervix.  Patient had clear evidence of disease progression on carbo/Taxol.  She saw GYN oncology today and is being referred to [facility] for consideration of clinical trial.  She is also seeing Dr. Grayland Ormond today.  Prognosis is likely poor.  Patient has had recent pain in the back and pelvic area.  Pain is described as sharp.  Pain is most likely related to the interval progression of her cancer.  She has previously tried Norco but did not like the way it made her feel.  She  is currently taking 1000 mg of acetaminophen twice daily.  Will start low-dose oxycodone.  Patient endorses anxiety but denies depression.  She requests a refill of her alprazolam.  We discussed emotional coping.  Discussed initiation of an SSRI but patient declined.  We had a conversation today regarding her goals.  Patient is hopeful that there will be future treatment options available to her but recognizes that her disease is incurable.  She also seems to understand that the prognosis is poor.  She wants to speak with Dr. Grayland Ormond specifically about prognostication.  We again discussed establishing advance care directives and a MOST Form.  Patient says that it is difficult to think about but that she knows that she should start considering those decisions.  Case discussed with Dr. Grayland Ormond.  PLAN: -Continue current scope of treatment -Start oxycodone 2.5-5 mg every 6 hours as needed for pain (#30) -Prophylactic bowel regimen with daily senna as needed -Alprazolam 0.25 mg twice daily (#45) -ACP documents and most form previously reviewed -RTC in 3 weeks   Patient expressed understanding and was in agreement with this plan. She also understands that She can call the clinic at any time with any questions, concerns, or complaints.     Time Total: 15 minutes  Visit consisted of counseling and education dealing with the complex and emotionally intense issues of symptom management and palliative care in the setting of serious and potentially life-threatening illness.Greater than 50%  of this time was spent counseling and coordinating care related to the above assessment and plan.  Signed by: Altha Harm, PhD, NP-C [phone] (Work Cell)

## 2019-09-19 NOTE — Progress Notes (Signed)
Discussed progressive right hydronephrosis with Dr. Erlene Quan who would like to see patient for evaluation and consideration of possible stent placement. Discussed with patient who is in agreement with referral. Dr. Grayland Ormond & Dr. Theora Gianotti updated.

## 2019-09-19 NOTE — Progress Notes (Signed)
[place]  [phone] [phone]  ID: Mariah Mcmahon OB: 12-24-1965  MR#: [id number]  [id number]  Patient Care Team: Venita Lick, NP as PCP - General (Nurse Practitioner) Clent Jacks, RN as Oncology Nurse Navigator   CHIEF COMPLAINT: Progressive stage IV cervical cancer  INTERVAL HISTORY: Patient returns to clinic today as an add-on to discuss her PET scan results and treatment planning.  She has noticed increased abdominal pain, but otherwise feels well.  She does not complain of any further vaginal bleeding.  She has no neurologic complaints.  She denies any recent fevers or illnesses.  She has a good appetite and denies weight loss.  She has no chest pain, shortness of breath, cough, or hemoptysis.  She denies any nausea, vomiting, constipation, or diarrhea.  She has no urinary complaints.  Patient offers no further specific complaints today.  REVIEW OF SYSTEMS:   Review of Systems  Constitutional: Negative.  Negative for fever, malaise/fatigue and weight loss.  Respiratory: Negative.  Negative for cough and shortness of breath.   Cardiovascular: Negative.  Negative for chest pain and leg swelling.  Gastrointestinal: Positive for abdominal pain.  Genitourinary: Negative.  Negative for hematuria.  Musculoskeletal: Negative.  Negative for back pain.  Skin: Negative.  Negative for rash.  Neurological: Negative.  Negative for dizziness, focal weakness, weakness and headaches.  Psychiatric/Behavioral: The patient is nervous/anxious.     As per HPI. Otherwise, a complete review of systems is negative.  PAST MEDICAL HISTORY: Past Medical History:  Diagnosis Date   Cancer ([employer])    cwervical cancer   Cervical cancer ([place])    No pertinent past medical history     PAST SURGICAL HISTORY: Past Surgical History:  Procedure Laterality Date   IVC FILTER REMOVAL N/A 08/07/2019   Procedure: IVC FILTER REMOVAL;  Surgeon: Katha Cabal, MD;  Location: [facility];  Service: Cardiovascular;  Laterality: N/A;   MASS EXCISION     Throat   PERIPHERAL VASCULAR THROMBECTOMY Left 06/05/2019   Procedure: PERIPHERAL VASCULAR THROMBECTOMY WITH IVC FILTER (LEFT LOWER EXTREMITY);  Surgeon: Katha Cabal, MD;  Location: [facility];  Service: Cardiovascular;  Laterality: Left;   PORTA CATH INSERTION N/A 06/06/2019   Procedure: PORTA CATH INSERTION;  Surgeon: Katha Cabal, MD;  Location: [facility];  Service: Cardiovascular;  Laterality: N/A;   TONSILLECTOMY      FAMILY HISTORY: Family History  Problem Relation Age of Onset   Brain cancer Mother 2   Alcohol abuse Father    Heart disease Father    Prostate cancer Father    Heart attack Father    Diabetes Father     ADVANCED DIRECTIVES (Y/N):  N  HEALTH MAINTENANCE: Social History   Tobacco Use   Smoking status: Former Smoker    Packs/day: 0.50    Years: 30.00    Pack years: 15.00    Types: Cigarettes    Quit date: 07/03/2019    Years since quitting: 0.2   Smokeless tobacco: Never Used  Substance Use Topics   Alcohol use: Not Currently   Drug use: Never     Colonoscopy:  PAP:  Bone density:  Lipid panel:  No Known Allergies  Current Outpatient Medications  Medication Sig Dispense Refill   acetaminophen (TYLENOL) 500 MG tablet Take 500 mg by mouth 2 (two) times daily.      ALPRAZolam (XANAX) 0.25 MG tablet Take 1 tablet (0.25 mg total)  by mouth 2 (two) times daily as needed for anxiety. 45 tablet 0   apixaban (ELIQUIS) 5 MG TABS tablet Take 1 tablet (5 mg total) by mouth 2 (two) times daily. 180 tablet 1   loratadine (CLARITIN) 10 MG tablet Take 10 mg by mouth daily.     oxybutynin (DITROPAN-XL) 5 MG 24 hr tablet Take 1 tablet (5 mg total) by mouth at bedtime. (Patient not taking: Reported on 09/19/2019) 30 tablet 0   oxyCODONE (OXY IR/ROXICODONE) 5 MG immediate release tablet Take 0.5-1 tablets  (2.5-5 mg total) by mouth every 6 (six) hours as needed for severe pain. 30 tablet 0   No current facility-administered medications for this visit.     OBJECTIVE: There were no vitals filed for this visit.   There is no height or weight on file to calculate BMI.    ECOG FS:0 - Asymptomatic  General: Thin, no acute distress. Eyes: Pink conjunctiva, anicteric sclera. HEENT: Normocephalic, moist mucous membranes. Lungs: Clear to auscultation bilaterally. Heart: Regular rate and rhythm. No rubs, murmurs, or gallops. Abdomen: Soft, nontender, nondistended. No organomegaly noted, normoactive bowel sounds. Musculoskeletal: No edema, cyanosis, or clubbing. Neuro: Alert, answering all questions appropriately. Cranial nerves grossly intact. Skin: No rashes or petechiae noted. Psych: Normal affect.   LAB RESULTS:  Lab Results  Component Value Date   NA 137 09/19/2019   K 4.0 09/19/2019   CL 105 09/19/2019   CO2 24 09/19/2019   GLUCOSE 109 (H) 09/19/2019   BUN 19 09/19/2019   CREATININE 1.11 (H) 09/19/2019   CALCIUM 9.1 09/19/2019   PROT 7.3 09/19/2019   ALBUMIN 3.9 09/19/2019   AST 35 09/19/2019   ALT 25 09/19/2019   ALKPHOS 172 (H) 09/19/2019   BILITOT 0.4 09/19/2019   GFRNONAA 57 (L) 09/19/2019   GFRAA >60 09/19/2019    Lab Results  Component Value Date   WBC 4.4 09/19/2019   NEUTROABS 3.5 09/19/2019   HGB 9.5 (L) 09/19/2019   HCT 28.6 (L) 09/19/2019   MCV 117.2 (H) 09/19/2019   PLT 137 (L) 09/19/2019     STUDIES: Nm Pet Image Restag (ps) Skull Base To Thigh  Addendum Date: 09/19/2019   ADDENDUM REPORT: 09/19/2019 13:36 ADDENDUM: There is mild to moderate right hydroureteronephrosis, progressive since 07/10/2019. Level of ureteral obstruction appears to be pelvic sidewall in the pelvis. Electronically Signed   By: Misty Stanley M.D.   On: 09/19/2019 13:36   Result Date: 09/19/2019 CLINICAL DATA:  Subsequent treatment strategy for cervical cancer. EXAM: NUCLEAR  MEDICINE PET SKULL BASE TO THIGH TECHNIQUE: 6.9 mCi F-18 FDG was injected intravenously. Full-ring PET imaging was performed from the skull base to thigh after the radiotracer. CT data was obtained and used for attenuation correction and anatomic localization. Fasting blood glucose: 104 mg/dl COMPARISON:  07/10/2019 FINDINGS: Mediastinal blood pool activity: SUV max 2.2 Liver activity: SUV max NA NECK: No hypermetabolic lymph nodes in the neck. Incidental CT findings: none CHEST: Hypermetabolic left supraclavicular lymph nodes are similar with SUV max = 5.5 today compared 8.0 previously. Bilateral hilar hypermetabolism has progressed, with SUV max = 9.0 on the right today compared to 2.8 previously. Hypermetabolic AP window lymph node is new in the interval with SUV max = 7.4 today. Hypermetabolic precarinal node has progressed with SUV max = right Incidental CT findings: Stable 3 mm right upper lobe nodule (88/3). No new suspicious pulmonary nodule or mass. Port-A-Cath tip is positioned in the right atrium. No suspicious pulmonary nodule or mass.  There is some atelectasis in the dependent lung bases bilaterally. ABDOMEN/PELVIS: New hypermetabolic lesion posterior right liver demonstrates SUV max = 7.5. No underlying CT correlate. Interval progression of hypermetabolic para-aortic lymphadenopathy in the abdomen left para-aortic node is 6.1 cm today and is probably the same node that was measured with SUV max = 3.1 previously. Small retrocaval node on 147/3 demonstrates SUV max = 4.2. Hypermetabolic left pelvic sidewall lymph node is new in the interval with SUV max = 5.0. New hypermetabolic foci identified in the region of the right vaginal fornix/cervix demonstrates SUV max = 12.4 Incidental CT findings: There is abdominal aortic atherosclerosis without aneurysm. Left common iliac vascular stent device noted. IVC filter seen previously has been removed in the interval. SKELETON: Relatively diffuse marrow uptake may  be related to stimulatory effects of therapy. Incidental CT findings: Similar appearance of the subtle heterogeneous sclerotic lesion in the left sacrum. IMPRESSION: 1. Interval progression of hypermetabolic nodal disease in the chest, abdomen, and pelvis. Lymph nodes are not well demonstrated on noncontrast CT images. 2. New hypermetabolic disease in the region of the right vaginal fornix/cervix compatible with disease progression. Electronically Signed: By: Misty Stanley M.D. On: 09/17/2019 13:44   Vas [place] Lower Extremity Venous (dvt)  Result Date: 09/06/2019  Lower Venous Study Risk Factors: DVT DVT post procedure x 2 weeks. Comparison Study: 06/18/2019 Performing Technologist: Charlane Ferretti RT (R)(VS)  Examination Guidelines: A complete evaluation includes B-mode imaging, spectral Doppler, color Doppler, and power Doppler as needed of all accessible portions of each vessel. Bilateral testing is considered an integral part of a complete examination. Limited examinations for reoccurring indications may be performed as noted.  +---------+---------------+---------+-----------+----------+--------------+  LEFT      Compressibility Phasicity Spontaneity Properties Thrombus Aging  +---------+---------------+---------+-----------+----------+--------------+  CFV       Full                                                             +---------+---------------+---------+-----------+----------+--------------+  SFJ       Full                                                             +---------+---------------+---------+-----------+----------+--------------+  FV Prox   Full                                                             +---------+---------------+---------+-----------+----------+--------------+  FV Mid    Full                                                             +---------+---------------+---------+-----------+----------+--------------+  FV Distal Full                                                              +---------+---------------+---------+-----------+----------+--------------+  POP       Full                                                             +---------+---------------+---------+-----------+----------+--------------+  PTV       Full                                                             +---------+---------------+---------+-----------+----------+--------------+  PERO      Full                                                             +---------+---------------+---------+-----------+----------+--------------+  Gastroc   Full                                                             +---------+---------------+---------+-----------+----------+--------------+  GSV       Full                                                             +---------+---------------+---------+-----------+----------+--------------+  SSV       Full                                                             +---------+---------------+---------+-----------+----------+--------------+  Summary: Left: There is no evidence of deep vein thrombosis in the lower extremity.There is no evidence of superficial venous thrombosis.  *See table(s) above for measurements and observations. Electronically signed by Hortencia Pilar MD on 09/06/2019 at 9:00:35 AM.    Final     ASSESSMENT: Progressive stage IV cervical cancer.  PLAN:    1.  Progressive stage IV cervical cancer: Biopsy results from April 24, 2019 confirmed the diagnosis.  PET scan results from September 17, 2019 reviewed independently and reported as above with progression of disease.  Case discussed with gynecology oncology and with the patient, and she has agreed to be evaluated for clinical trial at [facility] for her next line of treatment.  If patient does not qualify, can consider single agent topotecan or a cisplatin-based regimen.  No follow-up has been scheduled at this time, will continue to monitor patient closely if she is enrolled in the clinical  trial.  2.  Hydronephrosis: Slightly worse, but patient's kidney function remains within normal limits. 3.  Venous access: Patient now has had a port  placed. 4.  Renal insufficiency: Resolved. 5.  Anemia: Hemoglobin is improved to 9.5.  Continue to monitor closely while taking Eliquis.   6.  Abdominal pain: Slightly worse today.  Proceed with oxycodone as prescribed.  Appreciate palliative care input.   7.  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  She will require Eliquis for minimum 6 months, but she will likely require extended treatment.  Patient's IVC filter has been removed. 8.  Thrombocytopenia: Improved.  Patient expressed understanding and was in agreement with this plan. She also understands that She can call clinic at any time with any questions, concerns, or complaints.   Cancer Staging Cervical cancer [facility]) Staging form: Cervix Uteri, AJCC 8th Edition - Clinical: FIGO Stage IIIB (cT3b, cN1, cM0) - Signed by Lloyd Huger, MD on 05/09/2019   Lloyd Huger, MD   09/19/2019 4:24 PM   Addendum: Patient had a PET scan on July 10, 2019 and was also evaluated by gynecology oncology several days later.  Patient continues to have persistent disease that would be outside the field for brachii therapy.  After further discussion it was decided upon to proceed with systemic chemotherapy using carboplatinum, Taxol, and Avastin every 3 weeks for 4-6 cycles.  Patient's treatment is scheduled for Friday, therefore she will require On Pro Neulasta support since she will be unable to receive injection over the weekend.  Return to clinic on July 20, 2019 initiate cycle 1.   Lloyd Huger, MD 09/19/19 4:24 PM

## 2019-09-19 NOTE — Progress Notes (Signed)
Pt in for follow up, reports had some vaginal bleeding for 2 days the end of October.  Reports kidneys and bladder had been very painful, "feels like insides are very tender", pt also reports having some right side flank pain.  Denies pain today.

## 2019-09-19 NOTE — Telephone Encounter (Signed)
Dr. Grayland Ormond has reviewed labs. We can cancel her appointments this Friday. Called and notified [name]. With approval from palliative care we can change Friday appt to virtual.

## 2019-09-20 ENCOUNTER — Encounter: Payer: Self-pay | Admitting: Urology

## 2019-09-20 ENCOUNTER — Other Ambulatory Visit: Payer: Self-pay | Admitting: Radiology

## 2019-09-20 ENCOUNTER — Ambulatory Visit (INDEPENDENT_AMBULATORY_CARE_PROVIDER_SITE_OTHER): Payer: BC Managed Care – PPO | Admitting: Urology

## 2019-09-20 VITALS — BP 121/86 | HR 52 | Ht 62.0 in | Wt 118.0 lb

## 2019-09-20 DIAGNOSIS — N179 Acute kidney failure, unspecified: Secondary | ICD-10-CM

## 2019-09-20 DIAGNOSIS — N3941 Urge incontinence: Secondary | ICD-10-CM

## 2019-09-20 DIAGNOSIS — N1330 Unspecified hydronephrosis: Secondary | ICD-10-CM

## 2019-09-20 DIAGNOSIS — R109 Unspecified abdominal pain: Secondary | ICD-10-CM | POA: Diagnosis not present

## 2019-09-20 LAB — MICROSCOPIC EXAMINATION
Bacteria, UA: NONE SEEN
RBC, Urine: NONE SEEN /hpf (ref 0–2)

## 2019-09-20 LAB — URINALYSIS, COMPLETE
Bilirubin, UA: NEGATIVE
Glucose, UA: NEGATIVE
Ketones, UA: NEGATIVE
Leukocytes,UA: NEGATIVE
Nitrite, UA: NEGATIVE
Specific Gravity, UA: >1.03 — ABNORMAL HIGH (ref 1.005–1.030)
Urobilinogen, Ur: 0.2 mg/dL (ref 0.2–1.0)
pH, UA: 5.5 (ref 5.0–7.5)

## 2019-09-20 NOTE — Progress Notes (Signed)
09/20/2019 11:11 AM   Mariah Mcmahon [birth year] [id number]  Referring provider: Venita Lick, NP [address] [name],  [place] [address]  Chief Complaint  Patient presents with  . Hydronephrosis    New patient    HPI: Unfortunate 53 year old female who presents today for further evaluation of right hydronephrosis  She is a personal history of stage IIIB metastatic cervical cancer followed by Dr. Loura Pardon. Theora Gianotti.  Most recently, she underwent a PET scan on 07/10/2019 which shows worsening right hydroureteronephrosis and interval development/progression of metastatic disease.  It appears that the obstruction is near the level of disease along the right pelvic sidewall.  She is also been noted to have a bump in her creatinine, up to 1.11 previously 0.8.  She has been having some right flank pressure which is constant.  She has recently been managed with Tylenol but transition to oxycodone in the recent past.  She also notes that she has been having worsening urinary frequency and now urge incontinence.  This seems to be worsening.  She tried taking oxybutynin but had some side effects of the medication thus stopped taking it.  She also does not feel like it helps much.  She not tried any other medications.  No dysuria or gross hematuria.  She is being referred to [facility] for clinical trial.  Per Dr. Theora Gianotti, these medications are relatively nephrotoxic.   PMH: Past Medical History:  Diagnosis Date  . Cancer ([place])    cwervical cancer  . Cervical cancer ([place])   . No pertinent past medical history     Surgical History: Past Surgical History:  Procedure Laterality Date  . IVC FILTER REMOVAL N/A 08/07/2019   Procedure: IVC FILTER REMOVAL;  Surgeon: Katha Cabal, MD;  Location: [facility];  Service: Cardiovascular;  Laterality: N/A;  . MASS EXCISION     Throat  . PERIPHERAL VASCULAR THROMBECTOMY Left 06/05/2019   Procedure: PERIPHERAL VASCULAR THROMBECTOMY WITH IVC  FILTER (LEFT LOWER EXTREMITY);  Surgeon: Katha Cabal, MD;  Location: [facility];  Service: Cardiovascular;  Laterality: Left;  . PORTA CATH INSERTION N/A 06/06/2019   Procedure: PORTA CATH INSERTION;  Surgeon: Katha Cabal, MD;  Location: [facility];  Service: Cardiovascular;  Laterality: N/A;  . TONSILLECTOMY      Home Medications:  Allergies as of 09/20/2019   No Known Allergies     Medication List       Accurate as of September 20, 2019 11:11 AM. If you have any questions, ask your nurse or doctor.        acetaminophen 500 MG tablet Commonly known as: TYLENOL Take 500 mg by mouth 2 (two) times daily.   ALPRAZolam 0.25 MG tablet Commonly known as: XANAX Take 1 tablet (0.25 mg total) by mouth 2 (two) times daily as needed for anxiety.   apixaban 5 MG Tabs tablet Commonly known as: Eliquis Take 1 tablet (5 mg total) by mouth 2 (two) times daily.   loratadine 10 MG tablet Commonly known as: CLARITIN Take 10 mg by mouth daily.   oxybutynin 5 MG 24 hr tablet Commonly known as: DITROPAN-XL Take 1 tablet (5 mg total) by mouth at bedtime.   oxyCODONE 5 MG immediate release tablet Commonly known as: Oxy IR/ROXICODONE Take 0.5-1 tablets (2.5-5 mg total) by mouth every 6 (six) hours as needed for severe pain.       Allergies: No Known Allergies  Family History: Family History  Problem  Relation Age of Onset  . Brain cancer Mother 39  . Alcohol abuse Father   . Heart disease Father   . Prostate cancer Father   . Heart attack Father   . Diabetes Father     Social History:  reports that she quit smoking about 2 months ago. Her smoking use included cigarettes. She has a 15.00 pack-year smoking history. She has never used smokeless tobacco. She reports previous alcohol use. She reports that she does not use drugs.  ROS: UROLOGY Frequent Urination?: Yes Hard to postpone urination?: No Burning/pain with urination?: No Get up at night to  urinate?: No Leakage of urine?: Yes Urine stream starts and stops?: No Trouble starting stream?: No Do you have to strain to urinate?: No Blood in urine?: No Urinary tract infection?: No Sexually transmitted disease?: No Injury to kidneys or bladder?: No Painful intercourse?: No Weak stream?: No Currently pregnant?: No Vaginal bleeding?: No Last menstrual period?: n  Gastrointestinal Nausea?: No Vomiting?: No Indigestion/heartburn?: No Diarrhea?: No Constipation?: No  Constitutional Fever: No Night sweats?: No Weight loss?: No Fatigue?: No  Skin Skin rash/lesions?: No Itching?: No  Eyes Blurred vision?: No Double vision?: No  Ears/Nose/Throat Sore throat?: No Sinus problems?: No  Hematologic/Lymphatic Swollen glands?: No Easy bruising?: No  Cardiovascular Leg swelling?: No Chest pain?: No  Respiratory Cough?: No Shortness of breath?: No  Endocrine Excessive thirst?: No  Musculoskeletal Back pain?: Yes Joint pain?: No  Neurological Headaches?: No Dizziness?: No  Psychologic Depression?: No Anxiety?: No  Physical Exam: BP 121/86   Pulse (!) 52   Ht 5\' 2"  (1.575 m)   Wt 118 lb (53.5 kg)   BMI 21.58 kg/m   Constitutional:  Alert and oriented, No acute distress. HEENT: [place] AT, moist mucus membranes.  Trachea midline, no masses. Cardiovascular: No clubbing, cyanosis, or edema. Respiratory: Normal respiratory effort, no increased work of breathing. GI: Abdomen is soft, nontender, nondistended, no abdominal masses Skin: No rashes, bruises or suspicious lesions. Neurologic: Grossly intact, no focal deficits, moving all 4 extremities. Psychiatric: Normal mood and affect.  Laboratory Data: Lab Results  Component Value Date   WBC 4.4 09/19/2019   HGB 9.5 (L) 09/19/2019   HCT 28.6 (L) 09/19/2019   MCV 117.2 (H) 09/19/2019   PLT 137 (L) 09/19/2019    Lab Results  Component Value Date   CREATININE 1.11 (H) 09/19/2019   Urinalysis  Pending  Pertinent Imaging: Study Result  CLINICAL DATA:  Subsequent treatment strategy for cervical cancer.  EXAM: NUCLEAR MEDICINE PET SKULL BASE TO THIGH  TECHNIQUE: 6.9 mCi F-18 FDG was injected intravenously. Full-ring PET imaging was performed from the skull base to thigh after the radiotracer. CT data was obtained and used for attenuation correction and anatomic localization.  Fasting blood glucose: 104 mg/dl  COMPARISON:  07/10/2019  FINDINGS: Mediastinal blood pool activity: SUV max 2.2  Liver activity: SUV max NA  NECK: No hypermetabolic lymph nodes in the neck.  Incidental CT findings: none  CHEST: Hypermetabolic left supraclavicular lymph nodes are similar with SUV max = 5.5 today compared 8.0 previously.  Bilateral hilar hypermetabolism has progressed, with SUV max = 9.0 on the right today compared to 2.8 previously.  Hypermetabolic AP window lymph node is new in the interval with SUV max = 7.4 today.  Hypermetabolic precarinal node has progressed with SUV max = right  Incidental CT findings: Stable 3 mm right upper lobe nodule (88/3). No new suspicious pulmonary nodule or mass. Port-A-Cath tip is positioned  in the right atrium. No suspicious pulmonary nodule or mass. There is some atelectasis in the dependent lung bases bilaterally.  ABDOMEN/PELVIS: New hypermetabolic lesion posterior right liver demonstrates SUV max = 7.5. No underlying CT correlate.  Interval progression of hypermetabolic para-aortic lymphadenopathy in the abdomen left para-aortic node is 6.1 cm today and is probably the same node that was measured with SUV max = 3.1 previously. Small retrocaval node on 147/3 demonstrates SUV max = 4.2.  Hypermetabolic left pelvic sidewall lymph node is new in the interval with SUV max = 5.0.  New hypermetabolic foci identified in the region of the right vaginal fornix/cervix demonstrates SUV max = 12.4  Incidental CT  findings: There is abdominal aortic atherosclerosis without aneurysm. Left common iliac vascular stent device noted. IVC filter seen previously has been removed in the interval.  SKELETON: Relatively diffuse marrow uptake may be related to stimulatory effects of therapy.  Incidental CT findings: Similar appearance of the subtle heterogeneous sclerotic lesion in the left sacrum.  IMPRESSION: 1. Interval progression of hypermetabolic nodal disease in the chest, abdomen, and pelvis. Lymph nodes are not well demonstrated on noncontrast CT images. 2. New hypermetabolic disease in the region of the right vaginal fornix/cervix compatible with disease progression.  Electronically Signed: By: Misty Stanley M.D. On: 09/17/2019 13:44   ADDENDUM REPORT: 09/19/2019 13:36  ADDENDUM: There is mild to moderate right hydroureteronephrosis, progressive since 07/10/2019. Level of ureteral obstruction appears to be pelvic sidewall in the pelvis.   Electronically Signed   By: Misty Stanley M.D.   On: 09/19/2019 13:36  CT PET imaging was personally reviewed today.  Agree with radiologic interpretation.  There is moderate right hydroureteronephrosis which is appears to have progressed down to the level of the pelvis where there is metabolic activity.  Assessment & Plan:    1. Hydronephrosis, right Secondary to extrinsic compression from progressing cervical cancer within the right hemipelvis  In the setting of right flank discomfort, rising creatinine and worsening hydroureteronephrosis, I have strongly recommended right ureteral stent placement for urinary decompression.  This will both help with her right flank pressure, optimize renal function and drainage and allow her to better tolerate nephrotoxic agent.  We did discuss the possible complications with ureteral stent placement including increased risk of infection, gross hematuria, irritative urinary symptoms which will likely worsen.   We discussed that if the stone is not well tolerated, we could discuss removing her transition to percutaneous nephrostomy tube.  All questions were answered today in detail.  After lengthy discussion, she would like to proceed with stent placement.  We will plan on performing this on the 24th as an outpatient.  We will place a Bard Optima which can last 3 to 6 months and will reevaluate whether this could be removed versus exchanged at this time depending on her response to the clinical trial.  She is agreeable this plan.  Preop urine culture today.  She understands she will need Covid testing. - Urinalysis, Complete  2. AKI (acute kidney injury) (HCC) AS above  3. Right flank pain As abvoe  4. Urge incontinence of urine Worsening urinary frequency urge and urge incontinence  Failed to respond to oxybutynin  She is given samples of Myrbetriq 50 mg today to take daily to try and see if this helps her urinary symptoms.  Again she understands that stent may worsen her symptoms.  All questions answered.   Hollice Espy, MD  [facility] [address] [place], [place] [address] [phone]  

## 2019-09-20 NOTE — Patient Instructions (Signed)
Ureteral Stent Implantation  Ureteral stent implantation is a procedure to insert (implant) a flexible, soft, plastic tube (stent) into a ureter. Ureters are the tube-like parts of the body that drain urine from the kidneys. The stent supports the ureter while it heals and helps to drain urine. You may have a ureteral stent implanted after having a procedure to remove a blockage from the ureter (ureterolysis or pyeloplasty). You may also have a stent implanted to open the flow of urine when you have a blockage caused by a kidney stone, tumor, blood clot, or infection. You have two ureters, one on each side of the body. The ureters connect the kidneys to the organ that holds urine until it passes out of the body (bladder). The stent is placed so that one end is in the kidney, and one end is in the bladder. The stent is usually taken out after your ureter has healed. Depending on your condition, you may have a stent for just a few weeks, or you may have a long-term stent that will need to be replaced every few months. Tell a health care provider about:  Any allergies you have.  All medicines you are taking, including vitamins, herbs, eye drops, creams, and over-the-counter medicines.  Any problems you or family members have had with anesthetic medicines.  Any blood disorders you have.  Any surgeries you have had.  Any medical conditions you have.  Whether you are pregnant or may be pregnant. What are the risks? Generally, this is a safe procedure. However, problems may occur, including:  Infection.  Bleeding.  Allergic reactions to medicines.  Damage to other structures or organs. Tearing (perforation) of the ureter is possible.  Movement of the stent away from where it is placed during surgery (migration). What happens before the procedure? Medicines Ask your health care provider about:  Changing or stopping your regular medicines. This is especially important if you are taking  diabetes medicines or blood thinners.  Taking medicines such as aspirin and ibuprofen. These medicines can thin your blood. Do not take these medicines unless your health care provider tells you to take them.  Taking over-the-counter medicines, vitamins, herbs, and supplements. Eating and drinking Follow instructions from your health care provider about eating and drinking, which may include:  8 hours before the procedure - stop eating heavy meals or foods, such as meat, fried foods, or fatty foods.  6 hours before the procedure - stop eating light meals or foods, such as toast or cereal.  6 hours before the procedure - stop drinking milk or drinks that contain milk.  2 hours before the procedure - stop drinking clear liquids. Staying hydrated Follow instructions from your health care provider about hydration, which may include:  Up to 2 hours before the procedure - you may continue to drink clear liquids, such as water, clear fruit juice, black coffee, and plain tea. General instructions  Do not drink alcohol.  Do not use any products that contain nicotine or tobacco for at least 4 weeks before the procedure. These products include cigarettes, e-cigarettes, and chewing tobacco. If you need help quitting, ask your health care provider.  You may have an exam or testing, such as imaging or blood tests.  Ask your health care provider what steps will be taken to help prevent infection. These may include: ? Removing hair at the surgery site. ? Washing skin with a germ-killing soap. ? Taking antibiotic medicine.  Plan to have someone take you home   from the hospital or clinic.  If you will be going home right after the procedure, plan to have someone with you for 24 hours. What happens during the procedure?  An IV will be inserted into one of your veins.  You may be given a medicine to help you relax (sedative).  You may be given a medicine to make you fall asleep (general  anesthetic).  A thin, tube-shaped instrument with a light and tiny camera at the end (cystoscope) will be inserted into your urethra. The urethra is the tube that drains urine from the bladder out of the body. In men, the urethra opens at the end of the penis. In women, the urethra opens in front of the vaginal opening.  The cystoscope will be passed into your bladder.  A thin wire (guide wire) will be passed through your bladder and into your ureter. This is used to guide the stent into your ureter.  The stent will be inserted into your ureter.  The guide wire and the cystoscope will be removed.  A flexible tube (catheter) may be inserted through your urethra so that one end is in your bladder. This helps to drain urine from your bladder. The procedure may vary among hospitals and health care providers. What happens after the procedure?  Your blood pressure, heart rate, breathing rate, and blood oxygen level will be monitored until you leave the hospital or clinic.  You may continue to receive medicine and fluids through an IV.  You may have some soreness or pain in your abdomen and urethra. Medicines will be available to help you.  You will be encouraged to get up and walk around as soon as you can.  You may have a catheter draining your urine.  You will have some blood in your urine.  Do not drive for 24 hours if you were given a sedative during your procedure. Summary  Ureteral stent implantation is a procedure to insert a flexible, soft, plastic tube (stent) into a ureter.  You may have a stent implanted to support the ureter while it heals after a procedure or to open the flow of urine if there is a blockage.  Follow instructions from your health care provider about taking medicines and about eating and drinking before the procedure.  Depending on your condition, you may have a stent for just a few weeks, or you may have a long-term stent that will need to be replaced every  few months. This information is not intended to replace advice given to you by your health care provider. Make sure you discuss any questions you have with your health care provider. Document Released: 10/22/2000 Document Revised: 08/01/2018 Document Reviewed: 08/02/2018 Elsevier Patient Education  2020 [employer].

## 2019-09-20 NOTE — H&P (View-Only) (Signed)
09/20/2019 11:11 AM   Mariah Mcmahon [birth year] [id number]  Referring provider: Venita Lick, NP [address] [place],  [place] [address]  Chief Complaint  Patient presents with  . Hydronephrosis    New patient    HPI: Unfortunate 53 year old female who presents today for further evaluation of right hydronephrosis  She is a personal history of stage IIIB metastatic cervical cancer followed by Dr. Loura Mcmahon. Mariah Mcmahon.  Most recently, she underwent a PET scan on 07/10/2019 which shows worsening right hydroureteronephrosis and interval development/progression of metastatic disease.  It appears that the obstruction is near the level of disease along the right pelvic sidewall.  She is also been noted to have a bump in her creatinine, up to 1.11 previously 0.8.  She has been having some right flank pressure which is constant.  She has recently been managed with Tylenol but transition to oxycodone in the recent past.  She also notes that she has been having worsening urinary frequency and now urge incontinence.  This seems to be worsening.  She tried taking oxybutynin but had some side effects of the medication thus stopped taking it.  She also does not feel like it helps much.  She not tried any other medications.  No dysuria or gross hematuria.  She is being referred to [facility] for clinical trial.  Per Dr. Theora Mcmahon, these medications are relatively nephrotoxic.   PMH: Past Medical History:  Diagnosis Date  . Cancer ([place])    cwervical cancer  . Cervical cancer ([place])   . No pertinent past medical history     Surgical History: Past Surgical History:  Procedure Laterality Date  . IVC FILTER REMOVAL N/A 08/07/2019   Procedure: IVC FILTER REMOVAL;  Surgeon: Katha Cabal, MD;  Location: [place] [facility];  Service: Cardiovascular;  Laterality: N/A;  . MASS EXCISION     Throat  . PERIPHERAL VASCULAR THROMBECTOMY Left 06/05/2019   Procedure: PERIPHERAL VASCULAR THROMBECTOMY WITH IVC  FILTER (LEFT LOWER EXTREMITY);  Surgeon: Katha Cabal, MD;  Location: [place] [facility];  Service: Cardiovascular;  Laterality: Left;  . PORTA CATH INSERTION N/A 06/06/2019   Procedure: PORTA CATH INSERTION;  Surgeon: Katha Cabal, MD;  Location: [place] [facility];  Service: Cardiovascular;  Laterality: N/A;  . TONSILLECTOMY      Home Medications:  Allergies as of 09/20/2019   No Known Allergies     Medication List       Accurate as of September 20, 2019 11:11 AM. If you have any questions, ask your nurse or doctor.        acetaminophen 500 MG tablet Commonly known as: TYLENOL Take 500 mg by mouth 2 (two) times daily.   ALPRAZolam 0.25 MG tablet Commonly known as: XANAX Take 1 tablet (0.25 mg total) by mouth 2 (two) times daily as needed for anxiety.   apixaban 5 MG Tabs tablet Commonly known as: Eliquis Take 1 tablet (5 mg total) by mouth 2 (two) times daily.   loratadine 10 MG tablet Commonly known as: CLARITIN Take 10 mg by mouth daily.   oxybutynin 5 MG 24 hr tablet Commonly known as: DITROPAN-XL Take 1 tablet (5 mg total) by mouth at bedtime.   oxyCODONE 5 MG immediate release tablet Commonly known as: Oxy IR/ROXICODONE Take 0.5-1 tablets (2.5-5 mg total) by mouth every 6 (six) hours as needed for severe pain.       Allergies: No Known Allergies  Family History: Family History  Problem  Relation Age of Onset  . Brain cancer Mother 74  . Alcohol abuse Father   . Heart disease Father   . Prostate cancer Father   . Heart attack Father   . Diabetes Father     Social History:  reports that she quit smoking about 2 months ago. Her smoking use included cigarettes. She has a 15.00 pack-year smoking history. She has never used smokeless tobacco. She reports previous alcohol use. She reports that she does not use drugs.  ROS: UROLOGY Frequent Urination?: Yes Hard to postpone urination?: No Burning/pain with urination?: No Get up at night to  urinate?: No Leakage of urine?: Yes Urine stream starts and stops?: No Trouble starting stream?: No Do you have to strain to urinate?: No Blood in urine?: No Urinary tract infection?: No Sexually transmitted disease?: No Injury to kidneys or bladder?: No Painful intercourse?: No Weak stream?: No Currently pregnant?: No Vaginal bleeding?: No Last menstrual period?: n  Gastrointestinal Nausea?: No Vomiting?: No Indigestion/heartburn?: No Diarrhea?: No Constipation?: No  Constitutional Fever: No Night sweats?: No Weight loss?: No Fatigue?: No  Skin Skin rash/lesions?: No Itching?: No  Eyes Blurred vision?: No Double vision?: No  Ears/Nose/Throat Sore throat?: No Sinus problems?: No  Hematologic/Lymphatic Swollen glands?: No Easy bruising?: No  Cardiovascular Leg swelling?: No Chest pain?: No  Respiratory Cough?: No Shortness of breath?: No  Endocrine Excessive thirst?: No  Musculoskeletal Back pain?: Yes Joint pain?: No  Neurological Headaches?: No Dizziness?: No  Psychologic Depression?: No Anxiety?: No  Physical Exam: BP 121/86   Pulse (!) 52   Ht 5\' 2"  (1.575 m)   Wt 118 lb (53.5 kg)   BMI 21.58 kg/m   Constitutional:  Alert and oriented, No acute distress. HEENT: [place] AT, moist mucus membranes.  Trachea midline, no masses. Cardiovascular: No clubbing, cyanosis, or edema. Respiratory: Normal respiratory effort, no increased work of breathing. GI: Abdomen is soft, nontender, nondistended, no abdominal masses Skin: No rashes, bruises or suspicious lesions. Neurologic: Grossly intact, no focal deficits, moving all 4 extremities. Psychiatric: Normal mood and affect.  Laboratory Data: Lab Results  Component Value Date   WBC 4.4 09/19/2019   HGB 9.5 (L) 09/19/2019   HCT 28.6 (L) 09/19/2019   MCV 117.2 (H) 09/19/2019   PLT 137 (L) 09/19/2019    Lab Results  Component Value Date   CREATININE 1.11 (H) 09/19/2019   Urinalysis  Pending  Pertinent Imaging: Study Result  CLINICAL DATA:  Subsequent treatment strategy for cervical cancer.  EXAM: NUCLEAR MEDICINE PET SKULL BASE TO THIGH  TECHNIQUE: 6.9 mCi F-18 FDG was injected intravenously. Full-ring PET imaging was performed from the skull base to thigh after the radiotracer. CT data was obtained and used for attenuation correction and anatomic localization.  Fasting blood glucose: 104 mg/dl  COMPARISON:  07/10/2019  FINDINGS: Mediastinal blood pool activity: SUV max 2.2  Liver activity: SUV max NA  NECK: No hypermetabolic lymph nodes in the neck.  Incidental CT findings: none  CHEST: Hypermetabolic left supraclavicular lymph nodes are similar with SUV max = 5.5 today compared 8.0 previously.  Bilateral hilar hypermetabolism has progressed, with SUV max = 9.0 on the right today compared to 2.8 previously.  Hypermetabolic AP window lymph node is new in the interval with SUV max = 7.4 today.  Hypermetabolic precarinal node has progressed with SUV max = right  Incidental CT findings: Stable 3 mm right upper lobe nodule (88/3). No new suspicious pulmonary nodule or mass. Port-A-Cath tip is positioned  in the right atrium. No suspicious pulmonary nodule or mass. There is some atelectasis in the dependent lung bases bilaterally.  ABDOMEN/PELVIS: New hypermetabolic lesion posterior right liver demonstrates SUV max = 7.5. No underlying CT correlate.  Interval progression of hypermetabolic para-aortic lymphadenopathy in the abdomen left para-aortic node is 6.1 cm today and is probably the same node that was measured with SUV max = 3.1 previously. Small retrocaval node on 147/3 demonstrates SUV max = 4.2.  Hypermetabolic left pelvic sidewall lymph node is new in the interval with SUV max = 5.0.  New hypermetabolic foci identified in the region of the right vaginal fornix/cervix demonstrates SUV max = 12.4  Incidental CT  findings: There is abdominal aortic atherosclerosis without aneurysm. Left common iliac vascular stent device noted. IVC filter seen previously has been removed in the interval.  SKE[place]TON: Relatively diffuse marrow uptake may be related to stimulatory effects of therapy.  Incidental CT findings: Similar appearance of the subtle heterogeneous sclerotic lesion in the left sacrum.  IMPRESSION: 1. Interval progression of hypermetabolic nodal disease in the chest, abdomen, and pelvis. Lymph nodes are not well demonstrated on noncontrast CT images. 2. New hypermetabolic disease in the region of the right vaginal fornix/cervix compatible with disease progression.  Electronically Signed: By: Misty Stanley M.D. On: 09/17/2019 13:44   ADDENDUM REPORT: 09/19/2019 13:36  ADDENDUM: There is mild to moderate right hydroureteronephrosis, progressive since 07/10/2019. Level of ureteral obstruction appears to be pelvic sidewall in the pelvis.   Electronically Signed   By: Misty Stanley M.D.   On: 09/19/2019 13:36  CT PET imaging was personally reviewed today.  Agree with radiologic interpretation.  There is moderate right hydroureteronephrosis which is appears to have progressed down to the level of the pelvis where there is metabolic activity.  Assessment & Plan:    1. Hydronephrosis, right Secondary to extrinsic compression from progressing cervical cancer within the right hemipelvis  In the setting of right flank discomfort, rising creatinine and worsening hydroureteronephrosis, I have strongly recommended right ureteral stent placement for urinary decompression.  This will both help with her right flank pressure, optimize renal function and drainage and allow her to better tolerate nephrotoxic agent.  We did discuss the possible complications with ureteral stent placement including increased risk of infection, gross hematuria, irritative urinary symptoms which will likely worsen.   We discussed that if the stone is not well tolerated, we could discuss removing her transition to percutaneous nephrostomy tube.  All questions were answered today in detail.  After lengthy discussion, she would like to proceed with stent placement.  We will plan on performing this on the 24th as an outpatient.  We will place a Bard Optima which can last 3 to 6 months and will reevaluate whether this could be removed versus exchanged at this time depending on her response to the clinical trial.  She is agreeable this plan.  Preop urine culture today.  She understands she will need Covid testing. - Urinalysis, Complete  2. AKI (acute kidney injury) (HCC) AS above  3. Right flank pain As abvoe  4. Urge incontinence of urine Worsening urinary frequency urge and urge incontinence  Failed to respond to oxybutynin  She is given samples of Myrbetriq 50 mg today to take daily to try and see if this helps her urinary symptoms.  Again she understands that stent may worsen her symptoms.  All questions answered.   Hollice Espy, MD  [facility] [address], [address] [place], [place] [address] [phone]  

## 2019-09-21 ENCOUNTER — Ambulatory Visit: Payer: BC Managed Care – PPO

## 2019-09-21 ENCOUNTER — Inpatient Hospital Stay: Payer: BC Managed Care – PPO | Admitting: Hospice and Palliative Medicine

## 2019-09-21 ENCOUNTER — Inpatient Hospital Stay: Payer: BC Managed Care – PPO | Admitting: Oncology

## 2019-09-21 ENCOUNTER — Other Ambulatory Visit: Payer: BC Managed Care – PPO

## 2019-09-21 ENCOUNTER — Inpatient Hospital Stay (HOSPITAL_BASED_OUTPATIENT_CLINIC_OR_DEPARTMENT_OTHER): Payer: BC Managed Care – PPO | Admitting: Hospice and Palliative Medicine

## 2019-09-21 DIAGNOSIS — G893 Neoplasm related pain (acute) (chronic): Secondary | ICD-10-CM

## 2019-09-21 DIAGNOSIS — Z515 Encounter for palliative care: Secondary | ICD-10-CM

## 2019-09-21 DIAGNOSIS — C539 Malignant neoplasm of cervix uteri, unspecified: Secondary | ICD-10-CM

## 2019-09-21 NOTE — Progress Notes (Signed)
Virtual Visit via Telephone Note  I connected with Mariah Mcmahon on 09/21/19 at  9:00 AM EST by telephone and verified that I am speaking with the correct person using two identifiers.   I discussed the limitations, risks, security and privacy concerns of performing an evaluation and management service by telephone and the availability of in person appointments. I also discussed with the patient that there may be a patient responsible charge related to this service. The patient expressed understanding and agreed to proceed.   History of Present Illness: Palliative Care consult requested for this 53 y.o. 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  Patient is felt not to be a surgical candidate and treatment was initiated with concurrent chemoradiation.  Patient was hospitalized 06/05/2019 -06/07/2019 with acute left lower extremity DVT/pulmonary embolism status post thrombolysis and IVC filter placement.  IVC filter was later removed.  Patient had disease progression on carbo Taxol and is being referred to [facility] for a clinical trial.  Patient was referred to palliative care to help address goals and manage ongoing symptoms.   Observations/Objective: I called and spoke with patient by phone.  She saw urology yesterday and is scheduled for ureteral stenting.  Patient says that her pain is improved on oxycodone.  On average, she is taking 3 tablets a day.  She denies any adverse effects.  She is taking daily Senokot and denies constipation.  Patient says that she is scheduled at [facility] for consideration of clinical trial.  Patient denies any acute changes or concerns today.  Case discussed with Dr. Grayland Ormond  Assessment and Plan: Stage IIIb cervical cancer -evidence of recent progression.  Patient is being referred to [facility] for consideration of clinical trial.  Neoplasm related pain  -improved on oxycodone IR 5 mg every 6 hours as needed.  Would consider initiation of a long-acting opioid if needed.  Prophylactic bowel regimen -continue daily Senokot  Follow Up Instructions: Follow-up telephone visit in 3 to 4 weeks.   I discussed the assessment and treatment plan with the patient. The patient was provided an opportunity to ask questions and all were answered. The patient agreed with the plan and demonstrated an understanding of the instructions.   The patient was advised to call back or seek an in-person evaluation if the symptoms worsen or if the condition fails to improve as anticipated.  I provided 6 minutes of non-face-to-face time during this encounter.   Irean Hong, NP

## 2019-09-23 ENCOUNTER — Other Ambulatory Visit: Payer: Self-pay | Admitting: Nurse Practitioner

## 2019-09-24 DIAGNOSIS — Z79899 Other long term (current) drug therapy: Secondary | ICD-10-CM | POA: Insufficient documentation

## 2019-09-24 DIAGNOSIS — Z9189 Other specified personal risk factors, not elsewhere classified: Secondary | ICD-10-CM | POA: Insufficient documentation

## 2019-09-25 DIAGNOSIS — F17200 Nicotine dependence, unspecified, uncomplicated: Secondary | ICD-10-CM | POA: Insufficient documentation

## 2019-09-27 ENCOUNTER — Encounter
Admission: RE | Admit: 2019-09-27 | Discharge: 2019-09-27 | Disposition: A | Discharge status: Home / Self Care | Payer: BC Managed Care – PPO | Source: Ambulatory Visit | Attending: Urology | Admitting: Urology

## 2019-09-27 ENCOUNTER — Other Ambulatory Visit: Payer: Self-pay

## 2019-09-27 DIAGNOSIS — Z01812 Encounter for preprocedural laboratory examination: Secondary | ICD-10-CM | POA: Diagnosis not present

## 2019-09-27 DIAGNOSIS — Z20828 Contact with and (suspected) exposure to other viral communicable diseases: Secondary | ICD-10-CM | POA: Diagnosis not present

## 2019-09-27 HISTORY — DX: Anemia, unspecified: D64.9

## 2019-09-27 LAB — SARS CORONAVIRUS 2 (TAT 6-24 HRS): SARS Coronavirus 2: NEGATIVE

## 2019-09-27 NOTE — Patient Instructions (Signed)
Your procedure is scheduled on: Tues 11/24 Report to Day Surgery. To find out your arrival time please call [phone] between 1PM - 3PM on Mon. 11/23.  Remember: Instructions that are not followed completely may result in serious medical risk,  up to and including death, or upon the discretion of your surgeon and anesthesiologist your  surgery may need to be rescheduled.     _X__ 1. Do not eat food after midnight the night before your procedure.                 No gum chewing or hard candies. You may drink clear liquids up to 2 hours                 before you are scheduled to arrive for your surgery- DO not drink clear                 liquids within 2 hours of the start of your surgery.                 Clear Liquids include:  water, apple juice without pulp, clear carbohydrate                 drink such as Clearfast of Gatorade, Black Coffee or Tea (Do not add                 anything to coffee or tea).  __X__2.  On the morning of surgery brush your teeth with toothpaste and water, you                may rinse your mouth with mouthwash if you wish.  Do not swallow any toothpaste of mouthwash.     ___ 3.  No Alcohol for 24 hours before or after surgery.   ___ 4.  Do Not Smoke or use e-cigarettes For 24 Hours Prior to Your Surgery.                 Do not use any chewable tobacco products for at least 6 hours prior to                 surgery.  ____  5.  Bring all medications with you on the day of surgery if instructed.   __x__  6.  Notify your doctor if there is any change in your medical condition      (cold, fever, infections).     Do not wear jewelry, make-up, hairpins, clips or nail polish. Do not wear lotions, powders, or perfumes. You may wear deodorant. Do not shave 48 hours prior to surgery. Men may shave face and neck. Do not bring valuables to the hospital.    [facility] is not responsible for any belongings or valuables.  Contacts, dentures  or bridgework may not be worn into surgery. Leave your suitcase in the car. After surgery it may be brought to your room. For patients admitted to the hospital, discharge time is determined by your treatment team.   Patients discharged the day of surgery will not be allowed to drive home.   Please read over the following fact sheets that you were given:     __x__ Take these medicines the morning of surgery with A SIP OF WATER:    1. ALPRAZolam (XANAX) 0.25 MG tablet if needed  2. oxyCODONE (OXY IR/ROXICODONE) 5 MG immediate release tablet if needed  3.   4.  5.  6.  ____ Fleet Enema (as directed)  ____ Use CHG Soap as directed  ____ Use inhalers on the day of surgery  ____ Stop metformin 2 days prior to surgery    ____ Take 1/2 of usual insulin dose the night before surgery. No insulin the morning          of surgery.   __x__ Stop apixaban (ELIQUIS) 5 MG TABS tablet on 11/21  ____ Stop Anti-inflammatories on    ____ Stop supplements until after surgery.    ____ Bring C-Pap to the hospital.

## 2019-09-28 ENCOUNTER — Other Ambulatory Visit: Payer: BC Managed Care – PPO

## 2019-10-01 ENCOUNTER — Encounter: Payer: Self-pay | Admitting: Oncology

## 2019-10-01 ENCOUNTER — Other Ambulatory Visit: Payer: Self-pay | Admitting: Emergency Medicine

## 2019-10-01 DIAGNOSIS — C539 Malignant neoplasm of cervix uteri, unspecified: Secondary | ICD-10-CM

## 2019-10-01 MED ORDER — OXYCODONE HCL 5 MG PO TABS: 2.5000 mg | ORAL_TABLET | Freq: Four times a day (QID) | ORAL | 0 refills | Status: DC | PRN

## 2019-10-02 ENCOUNTER — Ambulatory Visit: Payer: BC Managed Care – PPO | Admitting: Anesthesiology

## 2019-10-02 ENCOUNTER — Ambulatory Visit
Admission: RE | Admit: 2019-10-02 | Discharge: 2019-10-02 | Disposition: A | Discharge status: Home / Self Care | Payer: BC Managed Care – PPO | Attending: Urology | Admitting: Urology

## 2019-10-02 ENCOUNTER — Encounter: Payer: Self-pay | Admitting: Emergency Medicine

## 2019-10-02 ENCOUNTER — Other Ambulatory Visit: Payer: Self-pay

## 2019-10-02 ENCOUNTER — Ambulatory Visit: Payer: BC Managed Care – PPO

## 2019-10-02 ENCOUNTER — Encounter
Admission: RE | Disposition: A | Discharge status: Home / Self Care | Payer: Self-pay | Source: Home / Self Care | Attending: Urology

## 2019-10-02 DIAGNOSIS — C539 Malignant neoplasm of cervix uteri, unspecified: Secondary | ICD-10-CM | POA: Insufficient documentation

## 2019-10-02 DIAGNOSIS — N179 Acute kidney failure, unspecified: Secondary | ICD-10-CM | POA: Insufficient documentation

## 2019-10-02 DIAGNOSIS — Z87891 Personal history of nicotine dependence: Secondary | ICD-10-CM | POA: Diagnosis not present

## 2019-10-02 DIAGNOSIS — N3941 Urge incontinence: Secondary | ICD-10-CM | POA: Insufficient documentation

## 2019-10-02 DIAGNOSIS — N1330 Unspecified hydronephrosis: Secondary | ICD-10-CM | POA: Insufficient documentation

## 2019-10-02 HISTORY — PX: CYSTOSCOPY W/ URETERAL STENT PLACEMENT: SHX1429

## 2019-10-02 LAB — URINALYSIS, ROUTINE W REFLEX MICROSCOPIC
Bilirubin Urine: NEGATIVE
Glucose, UA: NEGATIVE mg/dL
Hgb urine dipstick: NEGATIVE
Ketones, ur: NEGATIVE mg/dL
Leukocytes,Ua: NEGATIVE
Nitrite: NEGATIVE
Protein, ur: NEGATIVE mg/dL
Specific Gravity, Urine: 1.019 (ref 1.005–1.030)
pH: 6 (ref 5.0–8.0)

## 2019-10-02 LAB — POCT PREGNANCY, URINE: Preg Test, Ur: NEGATIVE

## 2019-10-02 SURGERY — CYSTOSCOPY, WITH RETROGRADE PYELOGRAM AND URETERAL STENT INSERTION
Anesthesia: General | Site: Ureter | Laterality: Right

## 2019-10-02 MED ORDER — CEFAZOLIN SODIUM-DEXTROSE 2-4 GM/100ML-% IV SOLN
2.0000 g | INTRAVENOUS | Status: AC
  Administered 2019-10-02: 2 g via INTRAVENOUS

## 2019-10-02 MED ORDER — FENTANYL CITRATE (PF) 100 MCG/2ML IJ SOLN
INTRAMUSCULAR | Status: AC
  Filled 2019-10-02: qty 2

## 2019-10-02 MED ORDER — ONDANSETRON HCL 4 MG/2ML IJ SOLN
INTRAMUSCULAR | Status: DC | PRN
  Administered 2019-10-02: 4 mg via INTRAVENOUS

## 2019-10-02 MED ORDER — FENTANYL CITRATE (PF) 100 MCG/2ML IJ SOLN
INTRAMUSCULAR | Status: DC | PRN
  Administered 2019-10-02 (×2): 50 ug via INTRAVENOUS

## 2019-10-02 MED ORDER — FAMOTIDINE 20 MG PO TABS
ORAL_TABLET | ORAL | Status: AC
  Administered 2019-10-02: 07:00:00 20 mg via ORAL
  Filled 2019-10-02: qty 1

## 2019-10-02 MED ORDER — PHENYLEPHRINE HCL (PRESSORS) 10 MG/ML IV SOLN
INTRAVENOUS | Status: DC | PRN
  Administered 2019-10-02 (×2): 50 ug via INTRAVENOUS

## 2019-10-02 MED ORDER — LIDOCAINE HCL (CARDIAC) PF 100 MG/5ML IV SOSY
PREFILLED_SYRINGE | INTRAVENOUS | Status: DC | PRN
  Administered 2019-10-02: 60 mg via INTRAVENOUS

## 2019-10-02 MED ORDER — FENTANYL CITRATE (PF) 100 MCG/2ML IJ SOLN: 25.0000 ug | INTRAMUSCULAR | Status: DC | PRN

## 2019-10-02 MED ORDER — FAMOTIDINE 20 MG PO TABS
20.0000 mg | ORAL_TABLET | Freq: Once | ORAL | Status: AC
  Administered 2019-10-02: 07:00:00 20 mg via ORAL

## 2019-10-02 MED ORDER — DEXAMETHASONE SODIUM PHOSPHATE 10 MG/ML IJ SOLN
INTRAMUSCULAR | Status: DC | PRN
  Administered 2019-10-02: 4 mg via INTRAVENOUS

## 2019-10-02 MED ORDER — CEFAZOLIN SODIUM-DEXTROSE 2-4 GM/100ML-% IV SOLN
INTRAVENOUS | Status: AC
  Filled 2019-10-02: qty 100

## 2019-10-02 MED ORDER — OXYCODONE HCL 5 MG/5ML PO SOLN: 5.0000 mg | Freq: Once | ORAL | Status: DC | PRN

## 2019-10-02 MED ORDER — LACTATED RINGERS IV SOLN
INTRAVENOUS | Status: DC
  Administered 2019-10-02: 07:00:00 via INTRAVENOUS

## 2019-10-02 MED ORDER — PROPOFOL 10 MG/ML IV BOLUS
INTRAVENOUS | Status: DC | PRN
  Administered 2019-10-02: 50 mg via INTRAVENOUS
  Administered 2019-10-02: 150 mg via INTRAVENOUS

## 2019-10-02 MED ORDER — PROPOFOL 10 MG/ML IV BOLUS
INTRAVENOUS | Status: AC
  Filled 2019-10-02: qty 80

## 2019-10-02 MED ORDER — OXYCODONE HCL 5 MG PO TABS: 5.0000 mg | ORAL_TABLET | Freq: Once | ORAL | Status: DC | PRN

## 2019-10-02 MED ORDER — IOHEXOL 180 MG/ML  SOLN
INTRAMUSCULAR | Status: DC | PRN
  Administered 2019-10-02: 20 mL

## 2019-10-02 MED ORDER — MIDAZOLAM HCL 2 MG/2ML IJ SOLN
INTRAMUSCULAR | Status: AC
  Filled 2019-10-02: qty 2

## 2019-10-02 MED ORDER — MIDAZOLAM HCL 2 MG/2ML IJ SOLN
INTRAMUSCULAR | Status: DC | PRN
  Administered 2019-10-02: 2 mg via INTRAVENOUS

## 2019-10-02 SURGICAL SUPPLY — 18 items
BAG DRAIN CYSTO-URO LG1000N (MISCELLANEOUS) ×3 IMPLANT
BRUSH SCRUB EZ 1% IODOPHOR (MISCELLANEOUS) ×2 IMPLANT
CATH URETL 5X70 OPEN END (CATHETERS) ×3 IMPLANT
DRAPE UTILITY 15X26 TOWEL STRL (DRAPES) ×3 IMPLANT
GLOVE BIO SURGEON STRL SZ 6.5 (GLOVE) ×2 IMPLANT
GLOVE BIO SURGEONS STRL SZ 6.5 (GLOVE) ×1
GOWN STRL REUS W/ TWL LRG LVL3 (GOWN DISPOSABLE) ×2 IMPLANT
GOWN STRL REUS W/TWL LRG LVL3 (GOWN DISPOSABLE) ×6
GUIDEWIRE STR DUAL SENSOR (WIRE) ×3 IMPLANT
KIT TURNOVER CYSTO (KITS) ×3 IMPLANT
PACK CYSTO AR (MISCELLANEOUS) ×3 IMPLANT
SET CYSTO W/LG BORE CLAMP LF (SET/KITS/TRAYS/PACK) ×3 IMPLANT
SOL .9 NS 3000ML IRR  AL (IV SOLUTION) ×2
SOL .9 NS 3000ML IRR AL (IV SOLUTION) ×1
SOL .9 NS 3000ML IRR UROMATIC (IV SOLUTION) ×1 IMPLANT
STENT URO INLAY 6FRX24CM (STENTS) ×2 IMPLANT
SURGILUBE 2OZ TUBE FLIPTOP (MISCELLANEOUS) ×3 IMPLANT
WATER STERILE IRR 1000ML POUR (IV SOLUTION) ×3 IMPLANT

## 2019-10-02 NOTE — Transfer of Care (Signed)
Immediate Anesthesia Transfer of Care Note  Patient: Mariah Mcmahon  Procedure(s) Performed: CYSTOSCOPY WITH RETROGRADE PYELOGRAM/URETERAL STENT PLACEMENT (Right Ureter)  Patient Location: PACU  Anesthesia Type:General  Level of Consciousness: drowsy and patient cooperative  Airway & Oxygen Therapy: Patient Spontanous Breathing and Patient connected to face mask oxygen  Post-op Assessment: Report given to RN and Post -op Vital signs reviewed and stable  Post vital signs: Reviewed and stable  Last Vitals:  Vitals Value Taken Time  BP 109/64 10/02/19 0804  Temp 36.7 C 10/02/19 0804  Pulse 66 10/02/19 0807  Resp 14 10/02/19 0807  SpO2 100 % 10/02/19 0807  Vitals shown include unvalidated device data.  Last Pain:  Vitals:   10/02/19 0622  TempSrc: Tympanic  PainSc: 3          Complications: No apparent anesthesia complications

## 2019-10-02 NOTE — Anesthesia Procedure Notes (Signed)
Procedure Name: LMA Insertion Date/Time: 10/02/2019 7:40 AM Performed by: Lowry Bowl, CRNA Pre-anesthesia Checklist: Patient identified, Emergency Drugs available, Suction available and Patient being monitored Patient Re-evaluated:Patient Re-evaluated prior to induction Oxygen Delivery Method: Circle system utilized Preoxygenation: Pre-oxygenation with 100% oxygen Induction Type: IV induction Ventilation: Mask ventilation without difficulty LMA: LMA inserted LMA Size: 3.0 Number of attempts: 2 (Attempted 3.5 first - not good seal) Placement Confirmation: positive ETCO2 and breath sounds checked- equal and bilateral Tube secured with: Tape Dental Injury: Teeth and Oropharynx as per pre-operative assessment

## 2019-10-02 NOTE — Discharge Instructions (Signed)
You have a ureteral stent in place.  This is a tube that extends from your kidney to your bladder.  This may cause urinary bleeding, burning with urination, and urinary frequency.  Please call our office or present to the ED if you develop fevers >101 or pain which is not able to be controlled with oral pain medications.  You may be given either Flomax and/ or ditropan to help with bladder spasms and stent pain in addition to pain medications.   ° °[facility] °[address], [address] °[place], [place] [address] °[phone] ° ° ° °AMBULATORY SURGERY  °DISCHARGE INSTRUCTIONS ° ° °1) The drugs that you were given will stay in your system until tomorrow so for the next 24 hours you should not: ° °A) Drive an automobile °B) Make any legal decisions °C) Drink any alcoholic beverage ° ° °2) You may resume regular meals tomorrow.  Today it is better to start with liquids and gradually work up to solid foods. ° °You may eat anything you prefer, but it is better to start with liquids, then soup and crackers, and gradually work up to solid foods. ° ° °3) Please notify your doctor immediately if you have any unusual bleeding, trouble breathing, redness and pain at the surgery site, drainage, fever, or pain not relieved by medication. ° ° ° °4) Additional Instructions: ° ° ° ° ° ° ° °Please contact your physician with any problems or Same Day Surgery at [phone], Monday through Friday 6 am to 4 pm, or [facility] [place] Main number at [phone]. °

## 2019-10-02 NOTE — Interval H&P Note (Signed)
Patient seen and examined on day of surgery.  No interval changes.  Regular rate and rhythm Clear to auscultation bilaterally  All questions answered.  Consent confirmed and patient site marked.

## 2019-10-02 NOTE — Anesthesia Preprocedure Evaluation (Addendum)
Anesthesia Evaluation  Patient identified by MRN, date of birth, ID band Patient awake    Reviewed: Allergy & Precautions, H&P , NPO status , Patient's Chart, lab work & pertinent test results  Airway Mallampati: II  TM Distance: >3 FB Neck ROM: full    Dental  (+) Teeth Intact   Pulmonary neg shortness of breath, neg COPD, neg recent URI, former smoker (quit 3 months ago),           Cardiovascular (-) angina(-) Past MI and (-) Cardiac Stents negative cardio ROS  (-) dysrhythmias      Neuro/Psych negative neurological ROS  negative psych ROS   GI/Hepatic negative GI ROS, Neg liver ROS,   Endo/Other  negative endocrine ROS  Renal/GU    Cervical cancer    Musculoskeletal   Abdominal   Peds  Hematology  (+) Blood dyscrasia, anemia ,   Anesthesia Other Findings Past Medical History: No date: Anemia No date: Cancer ([place])     Comment:  cwervical cancer No date: Cervical cancer (HCC) No date: No pertinent past medical history  Past Surgical History: 08/07/2019: IVC FILTER REMOVAL; N/A     Comment:  Procedure: IVC FILTER REMOVAL;  Surgeon: Katha Cabal, MD;  Location: [place] [facility];  Service:              Cardiovascular;  Laterality: N/A; No date: MASS EXCISION     Comment:  Throat 06/05/2019: PERIPHERAL VASCULAR THROMBECTOMY; Left     Comment:  Procedure: PERIPHERAL VASCULAR THROMBECTOMY WITH IVC               FILTER (LEFT LOWER EXTREMITY);  Surgeon: Katha Cabal, MD;  Location: [place] [facility];  Service:               Cardiovascular;  Laterality: Left; 06/06/2019: PORTA CATH INSERTION; N/A     Comment:  Procedure: PORTA CATH INSERTION;  Surgeon: Katha Cabal, MD;  Location: [place] [facility];  Service:              Cardiovascular;  Laterality: N/A; No date: TONSILLECTOMY  BMI    Body Mass Index: 21.58 kg/m      Reproductive/Obstetrics negative OB ROS                            Anesthesia Physical Anesthesia Plan  ASA: II  Anesthesia Plan: General LMA   Post-op Pain Management:    Induction:   PONV Risk Score and Plan: Dexamethasone, Ondansetron, Midazolam and Treatment may vary due to age or medical condition  Airway Management Planned:   Additional Equipment:   Intra-op Plan:   Post-operative Plan:   Informed Consent: I have reviewed the patients History and Physical, chart, labs and discussed the procedure including the risks, benefits and alternatives for the proposed anesthesia with the patient or authorized representative who has indicated his/her understanding and acceptance.     Dental Advisory Given  Plan Discussed with: Anesthesiologist  Anesthesia Plan Comments:         Anesthesia Quick Evaluation

## 2019-10-02 NOTE — OR Nursing (Signed)
Discussed eliquis with Dr. Erlene Quan via tele.   Advises patient may resume today, patient notified of same.

## 2019-10-02 NOTE — Anesthesia Post-op Follow-up Note (Signed)
Anesthesia QCDR form completed.        

## 2019-10-02 NOTE — Op Note (Signed)
Date of procedure: 10/02/19  Preoperative diagnosis:  1. Right hydronephrosis 2. Cervical cancer  Postoperative diagnosis:  1. Same as above  Procedure: 1. Cystoscopy 2. Right retrograde pyelogram 3. Right ureteral stent placement  Surgeon: Hollice Espy, MD  Anesthesia: General  Complications: None  Intraoperative findings: Mild to moderate right hydronephrosis down to the level of the pelvis consistent with previous scans.  Stent placed without difficulty.  EBL: Minimal  Specimens: UA  Drains: 6 x 24 French double-J ureteral stent, Bard Optima  Indication: Mariah Mcmahon is a 53 y.o. patient with stage IIIb metastatic cervical cancer with progressive right hydroureteronephrosis secondary to extrinsic compression of the ureter with slightly rising creatinine..  After reviewing the management options for treatment,s he elected to proceed with the above surgical procedure(s). We have discussed the potential benefits and risks of the procedure, side effects of the proposed treatment, the likelihood of the patient achieving the goals of the procedure, and any potential problems that might occur during the procedure or recuperation. Informed consent has been obtained.  Description of procedure:  The patient was taken to the operating room and general anesthesia was induced.  The patient was placed in the dorsal lithotomy position, prepped and draped in the usual sterile fashion, and preoperative antibiotics were administered. A preoperative time-out was performed.   A 21 French scope was advanced per urethra into the bladder.  At Dr. Gershon Crane request, I did obtain a catheterized urinalysis from the scope to evaluate for possible proteinuria.  The bladder was drained and refilled.  Attention was then turned to the right UO.  Notably the bladder itself was normal without lesions, tumors, or any other suspicious findings.  The right UO was slightly stenotic requiring a wire in order to  advance a 5 [place] open-ended ureteral catheter just within the UO.  A gentle retrograde pyelogram was then performed which showed mild to moderate hydroureteronephrosis extending down to the pelvic sidewall consistent with previous CT scan findings.  The wire was then advanced up to level the kidney without difficulty.  A 6 x 24 French Bard Optima double-J ureteral stent was advanced over the wire to level the renal pelvis.  The wires partially drawn till full coils noted both within the renal pelvis as well as within the bladder.  The bladder was then drained.  The patient was then cleaned and dried, repositioned in supine position, reversed myesthesia, taken to the PACU in stable condition.  Plan: I will see the patient back in about 3 months to discuss stent exchange and assess her response to her upcoming treatment.  She has been provided with Myrbetriq and oxybutynin for bladder spasms.  Hollice Espy, M.D.

## 2019-10-03 NOTE — Anesthesia Postprocedure Evaluation (Signed)
Anesthesia Post Note  Patient: Mariah Mcmahon  Procedure(s) Performed: CYSTOSCOPY WITH RETROGRADE PYELOGRAM/URETERAL STENT PLACEMENT (Right Ureter)  Patient location during evaluation: PACU Anesthesia Type: General Level of consciousness: awake and alert Pain management: pain level controlled Vital Signs Assessment: post-procedure vital signs reviewed and stable Respiratory status: spontaneous breathing, nonlabored ventilation and respiratory function stable Cardiovascular status: blood pressure returned to baseline and stable Postop Assessment: no apparent nausea or vomiting Anesthetic complications: no     Last Vitals:  Vitals:   10/02/19 0843 10/02/19 0859  BP: 121/80 122/71  Pulse: 68 67  Resp: 16 16  Temp: 36.5 C   SpO2: 100% 99%    Last Pain:  Vitals:   10/03/19 0823  TempSrc:   PainSc: [place]

## 2019-10-10 ENCOUNTER — Other Ambulatory Visit: Payer: Self-pay

## 2019-10-10 DIAGNOSIS — R809 Proteinuria, unspecified: Secondary | ICD-10-CM

## 2019-10-10 DIAGNOSIS — C539 Malignant neoplasm of cervix uteri, unspecified: Secondary | ICD-10-CM | POA: Diagnosis not present

## 2019-10-12 ENCOUNTER — Other Ambulatory Visit: Payer: Self-pay

## 2019-10-12 ENCOUNTER — Inpatient Hospital Stay: Payer: BC Managed Care – PPO | Attending: Hospice and Palliative Medicine | Admitting: Hospice and Palliative Medicine

## 2019-10-12 DIAGNOSIS — G893 Neoplasm related pain (acute) (chronic): Secondary | ICD-10-CM | POA: Diagnosis not present

## 2019-10-12 DIAGNOSIS — Z79899 Other long term (current) drug therapy: Secondary | ICD-10-CM | POA: Insufficient documentation

## 2019-10-12 DIAGNOSIS — R109 Unspecified abdominal pain: Secondary | ICD-10-CM | POA: Insufficient documentation

## 2019-10-12 DIAGNOSIS — R110 Nausea: Secondary | ICD-10-CM | POA: Insufficient documentation

## 2019-10-12 DIAGNOSIS — Z515 Encounter for palliative care: Secondary | ICD-10-CM | POA: Diagnosis not present

## 2019-10-12 DIAGNOSIS — Z86711 Personal history of pulmonary embolism: Secondary | ICD-10-CM | POA: Insufficient documentation

## 2019-10-12 DIAGNOSIS — Z86718 Personal history of other venous thrombosis and embolism: Secondary | ICD-10-CM | POA: Insufficient documentation

## 2019-10-12 DIAGNOSIS — C539 Malignant neoplasm of cervix uteri, unspecified: Secondary | ICD-10-CM | POA: Diagnosis not present

## 2019-10-12 DIAGNOSIS — R630 Anorexia: Secondary | ICD-10-CM | POA: Insufficient documentation

## 2019-10-12 DIAGNOSIS — R5383 Other fatigue: Secondary | ICD-10-CM | POA: Insufficient documentation

## 2019-10-12 DIAGNOSIS — R634 Abnormal weight loss: Secondary | ICD-10-CM | POA: Insufficient documentation

## 2019-10-12 DIAGNOSIS — R531 Weakness: Secondary | ICD-10-CM | POA: Insufficient documentation

## 2019-10-12 DIAGNOSIS — C530 Malignant neoplasm of endocervix: Secondary | ICD-10-CM | POA: Insufficient documentation

## 2019-10-12 NOTE — Progress Notes (Signed)
Virtual Visit via Telephone Note  I connected with Mariah Mcmahon on 10/12/19 at 11:30 AM EST by telephone and verified that I am speaking with the correct person using two identifiers.   I discussed the limitations, risks, security and privacy concerns of performing an evaluation and management service by telephone and the availability of in person appointments. I also discussed with the patient that there may be a patient responsible charge related to this service. The patient expressed understanding and agreed to proceed.   History of Present Illness: Palliative Care consult requested for this 52 y.o. 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  Patient is felt not to be a surgical candidate and treatment was initiated with concurrent chemoradiation.  Patient was hospitalized 06/05/2019 -06/07/2019 with acute left lower extremity DVT/pulmonary embolism status post thrombolysis and IVC filter placement.  IVC filter was later removed.  Patient had disease progression on carbo Taxol and is being referred to [facility] for a clinical trial.  Patient was referred to palliative care to help address goals and manage ongoing symptoms.   Observations/Objective: I called and spoke with patient by phone.  She is status post ureteral stenting and says that she initially had some discomfort with that but it is resolved.  She finds her pain is stable with as needed use of oxycodone.  She does not need any refills today.  Patient reports that she is otherwise doing reasonably well.  She remains functionally independent in her home.  She has follow-up scheduled at [facility] for consideration of clinical trial.  Patient says that she is praying about the clinical trial in hopes that it will come to fruition.  However, patient says that she is excepting of "what ever happens".  Assessment and Plan: Stage IIIb  cervical cancer -evidence of recent progression.  Patient is being referred to [facility] for consideration of clinical trial.  Neoplasm related pain -improved on oxycodone IR 5 mg every 6 hours as needed.  Would consider initiation of a long-acting opioid if needed.  Prophylactic bowel regimen -continue daily Senokot  Follow Up Instructions: Follow-up telephone visit in 3 to 4 weeks.   I discussed the assessment and treatment plan with the patient. The patient was provided an opportunity to ask questions and all were answered. The patient agreed with the plan and demonstrated an understanding of the instructions.   The patient was advised to call back or seek an in-person evaluation if the symptoms worsen or if the condition fails to improve as anticipated.  I provided 5 minutes of non-face-to-face time during this encounter.   Irean Hong, NP

## 2019-10-15 ENCOUNTER — Other Ambulatory Visit: Payer: Self-pay

## 2019-10-15 ENCOUNTER — Inpatient Hospital Stay: Payer: BC Managed Care – PPO

## 2019-10-15 ENCOUNTER — Telehealth: Payer: Self-pay

## 2019-10-15 DIAGNOSIS — R109 Unspecified abdominal pain: Secondary | ICD-10-CM | POA: Diagnosis not present

## 2019-10-15 DIAGNOSIS — C530 Malignant neoplasm of endocervix: Secondary | ICD-10-CM | POA: Diagnosis not present

## 2019-10-15 DIAGNOSIS — Z86718 Personal history of other venous thrombosis and embolism: Secondary | ICD-10-CM | POA: Diagnosis not present

## 2019-10-15 DIAGNOSIS — R630 Anorexia: Secondary | ICD-10-CM | POA: Diagnosis not present

## 2019-10-15 DIAGNOSIS — Z79899 Other long term (current) drug therapy: Secondary | ICD-10-CM | POA: Diagnosis not present

## 2019-10-15 DIAGNOSIS — R5383 Other fatigue: Secondary | ICD-10-CM | POA: Diagnosis not present

## 2019-10-15 DIAGNOSIS — R110 Nausea: Secondary | ICD-10-CM | POA: Diagnosis not present

## 2019-10-15 DIAGNOSIS — R809 Proteinuria, unspecified: Secondary | ICD-10-CM

## 2019-10-15 DIAGNOSIS — R634 Abnormal weight loss: Secondary | ICD-10-CM | POA: Diagnosis not present

## 2019-10-15 DIAGNOSIS — C539 Malignant neoplasm of cervix uteri, unspecified: Secondary | ICD-10-CM

## 2019-10-15 DIAGNOSIS — Z86711 Personal history of pulmonary embolism: Secondary | ICD-10-CM | POA: Diagnosis not present

## 2019-10-15 DIAGNOSIS — R531 Weakness: Secondary | ICD-10-CM | POA: Diagnosis not present

## 2019-10-15 LAB — URINALYSIS, COMPLETE (UACMP) WITH MICROSCOPIC
Bacteria, UA: NONE SEEN
RBC / HPF: >50 RBC/hpf — ABNORMAL HIGH (ref 0–5)
Specific Gravity, Urine: 1.028 (ref 1.005–1.030)
WBC, UA: >50 WBC/hpf — ABNORMAL HIGH (ref 0–5)

## 2019-10-15 NOTE — Telephone Encounter (Signed)
I have called the [facility] office to see if Mariah Mcmahon could be seen sooner as she is trying to get on a clinical trial. They were not able to see her any sooner. I have a call out to the [facility] office and am awaiting callback. [name] at [facility] a message to let her know in case she can/or needs to be seen sooner at [facility].

## 2019-10-16 ENCOUNTER — Other Ambulatory Visit: Payer: Self-pay | Admitting: Nurse Practitioner

## 2019-10-16 ENCOUNTER — Other Ambulatory Visit: Payer: Self-pay | Admitting: Oncology

## 2019-10-16 DIAGNOSIS — C539 Malignant neoplasm of cervix uteri, unspecified: Secondary | ICD-10-CM

## 2019-10-17 ENCOUNTER — Other Ambulatory Visit: Payer: Self-pay | Admitting: Emergency Medicine

## 2019-10-17 ENCOUNTER — Encounter: Payer: Self-pay | Admitting: Obstetrics and Gynecology

## 2019-10-17 MED ORDER — OXYBUTYNIN CHLORIDE ER 5 MG PO TB24: 5.0000 mg | ORAL_TABLET | Freq: Every day | ORAL | 0 refills | Status: DC

## 2019-10-17 MED ORDER — OXYCODONE HCL 5 MG PO TABS: 2.5000 mg | ORAL_TABLET | Freq: Four times a day (QID) | ORAL | 0 refills | Status: DC | PRN

## 2019-10-17 NOTE — Telephone Encounter (Signed)
I contacted Ms. Schleicher. Her most urinalysis was not interpretable. I have recommended proceeding with treatment off protocol. She is having increasing symptoms. She does have Nephrology consult pending to evaluate cause of proteinuria at the end of December. Her PD-L1 treatment is still pending.  Mi Balla Gaetana Michaelis, MD

## 2019-10-18 ENCOUNTER — Encounter: Payer: Self-pay | Admitting: Oncology

## 2019-10-19 ENCOUNTER — Other Ambulatory Visit: Payer: Self-pay | Admitting: Hospice and Palliative Medicine

## 2019-10-19 ENCOUNTER — Telehealth: Payer: Self-pay | Admitting: Hospice and Palliative Medicine

## 2019-10-19 ENCOUNTER — Telehealth: Payer: Self-pay | Admitting: Emergency Medicine

## 2019-10-19 DIAGNOSIS — C539 Malignant neoplasm of cervix uteri, unspecified: Secondary | ICD-10-CM

## 2019-10-19 NOTE — Telephone Encounter (Signed)
Called and spoke with patient to let her know that STD forms had been faxed to the [facility] this morning. Also, got more information from her regarding the pain she is having that she mentioned in her MyChart message. Pt reports that when she wakes up she's having 6/10 pain in her legs and back that gets progressively worse throughout the day and is at its worse at night and when she's trying to sleep. Reports that she's only been taking half of an oxycodone q6h during the day and 1 at night. Told pt that I would speak to [facility] regarding her pain and one of [place] would call her back with recommendations. Pt verbalized understanding. Pt asked what the plan was for her appt on 12/15 and wanted to make sure that Dr. Grayland Ormond "wouldn't be telling me there's nowhere to go from here." Explained to pt that the visit was to come up with a treatment plan and where to go from here. Pt verbalized understanding and thanked me for the phone call.

## 2019-10-19 NOTE — Telephone Encounter (Signed)
Spoke with patient by phone.  She reports having had increased right flank pain over the last couple of weeks.  Pain is most severe in the morning and then worsens throughout the day.  She has had chronic hematuria and had a right ureteral stent placed on 10/02/2019.  Patient had UA checked on 10/15/2019 but analysis was affected by discoloration of the urine.  Patient denies UTI symptoms.  She denies fever or chills.  Patient is taking half a tablet of oxycodone twice daily.  We discussed increasing the dose to a full tablet, which she may take every 4-6 hours as needed.  Case discussed with Dr. Grayland Ormond.  We will pursue obtaining CT scan of the abdomen and pelvis to better characterize ureteral stent and cancer.  Will refer back to urology if needed.

## 2019-10-21 ENCOUNTER — Other Ambulatory Visit: Payer: Self-pay | Admitting: Oncology

## 2019-10-21 NOTE — Progress Notes (Addendum)
[place]  [phone]) [phone] [phone]) [phone]  ID: Mariah Mcmahon OB: Oct 05, 1966  MR#: [id number]  JJO#:[id number]  Patient Care Team: Venita Lick, NP as PCP - General (Nurse Practitioner) Clent Jacks, RN as Oncology Nurse Navigator   CHIEF COMPLAINT: Stage IV cervical cancer  INTERVAL HISTORY: Patient returns to clinic today for further evaluation and treatment planning.  Initial plan was to enroll in clinical trial at [facility], but given significant amount of protein in patient's urine she did not qualify.  Upon arrival to clinic patient was in significant amount of pain particularly on her right flank and abdomen.  She has a poor appetite and admits to recent weight loss.  She continues to be anxious.  She has no neurologic complaints.  She denies any recent fevers or illnesses.  She has no chest pain, shortness of breath, cough, or hemoptysis.  She denies any nausea, vomiting, constipation, or diarrhea.  She has no urinary complaints.  Patient offers no further specific complaints today.    REVIEW OF SYSTEMS:   Review of Systems  Constitutional: Negative.  Negative for fever, malaise/fatigue and weight loss.  Respiratory: Negative.  Negative for cough and shortness of breath.   Cardiovascular: Negative.  Negative for chest pain and leg swelling.  Gastrointestinal: Negative for abdominal pain.  Genitourinary: Negative.  Negative for hematuria.  Musculoskeletal: Negative.  Negative for back pain.  Skin: Negative.  Negative for rash.  Neurological: Negative.  Negative for dizziness, focal weakness, weakness and headaches.  Psychiatric/Behavioral: Negative.  The patient is not nervous/anxious.     As per HPI. Otherwise, a complete review of systems is negative.  PAST MEDICAL HISTORY: Past Medical History:  Diagnosis Date  . Anemia   . Cancer ([place])    cwervical cancer  . Cervical cancer ([place])   . No pertinent past medical history      PAST SURGICAL HISTORY: Past Surgical History:  Procedure Laterality Date  . CYSTOSCOPY W/ URETERAL STENT PLACEMENT Right 10/02/2019   Procedure: CYSTOSCOPY WITH RETROGRADE PYELOGRAM/URETERAL STENT PLACEMENT;  Surgeon: Hollice Espy, MD;  Location: [facility] ORS;  Service: Urology;  Laterality: Right;  . IVC FILTER REMOVAL N/A 08/07/2019   Procedure: IVC FILTER REMOVAL;  Surgeon: Katha Cabal, MD;  Location: [place] [facility];  Service: Cardiovascular;  Laterality: N/A;  . MASS EXCISION     Throat  . PERIPHERAL VASCULAR THROMBECTOMY Left 06/05/2019   Procedure: PERIPHERAL VASCULAR THROMBECTOMY WITH IVC FILTER (LEFT LOWER EXTREMITY);  Surgeon: Katha Cabal, MD;  Location: [place] [facility];  Service: Cardiovascular;  Laterality: Left;  . PORTA CATH INSERTION N/A 06/06/2019   Procedure: PORTA CATH INSERTION;  Surgeon: Katha Cabal, MD;  Location: [place] [facility];  Service: Cardiovascular;  Laterality: N/A;  . TONSILLECTOMY      FAMILY HISTORY: Family History  Problem Relation Age of Onset  . Brain cancer Mother 75  . Alcohol abuse Father   . Heart disease Father   . Prostate cancer Father   . Heart attack Father   . Diabetes Father     ADVANCED DIRECTIVES (Y/N):  N  HEALTH MAINTENANCE: Social History   Tobacco Use  . Smoking status: Former Smoker    Packs/day: 0.50    Years: 30.00    Pack years: 15.00    Types: Cigarettes    Quit date: 07/03/2019    Years since quitting: 0.3  . Smokeless tobacco: Never Used  Substance Use Topics  . Alcohol  use: Not Currently  . Drug use: Never     Colonoscopy:  PAP:  Bone density:  Lipid panel:  No Known Allergies  Current Outpatient Medications  Medication Sig Dispense Refill  . ALPRAZolam (XANAX) 0.25 MG tablet Take 1 tablet (0.25 mg total) by mouth 2 (two) times daily as needed for anxiety. 45 tablet 0  . apixaban (ELIQUIS) 5 MG TABS tablet Take 1 tablet (5 mg total) by mouth 2 (two) times daily. 180  tablet 1  . mirabegron ER (MYRBETRIQ) 50 MG TB24 tablet Take 50 mg by mouth daily. *Sample Med    . mirtazapine (REMERON) 7.5 MG tablet Take 1 tablet (7.5 mg total) by mouth at bedtime. 30 tablet 2  . oxybutynin (DITROPAN-XL) 5 MG 24 hr tablet Take 1 tablet (5 mg total) by mouth at bedtime. TAKE 1 TABLET BY MOUTH EVERYDAY AT BEDTIME 30 tablet 0  . oxyCODONE (OXY IR/ROXICODONE) 5 MG immediate release tablet Take 1-2 tablets (5-10 mg total) by mouth every 4 (four) hours as needed for severe pain. 60 tablet 0  . oxyCODONE (OXYCONTIN) 15 mg 12 hr tablet Take 1 tablet (15 mg total) by mouth every 12 (twelve) hours. 30 tablet 0  . tamsulosin (FLOMAX) 0.4 MG CAPS capsule Take 1 capsule (0.4 mg total) by mouth daily. 30 capsule 1   No current facility-administered medications for this visit.    OBJECTIVE: Vitals:   10/23/19 1030  BP: 121/72  Pulse: 88  Resp: (!) 22  Temp: (!) 96.1 F (35.6 C)  SpO2: 99%     Body mass index is 20.83 kg/m.    ECOG FS:0 - Asymptomatic  General: Thin, mild distress secondary to pain. Eyes: Pink conjunctiva, anicteric sclera. HEENT: Normocephalic, moist mucous membranes. Lungs: No audible wheezing or coughing. Heart: Regular rate and rhythm. Abdomen: Soft, nontender, no obvious distention. Musculoskeletal: No edema, cyanosis, or clubbing. Neuro: Alert, answering all questions appropriately. Cranial nerves grossly intact. Skin: No rashes or petechiae noted. Psych: Normal affect.  LAB RESULTS:  Lab Results  Component Value Date   NA 137 10/23/2019   K 3.2 (L) 10/23/2019   CL 102 10/23/2019   CO2 22 10/23/2019   GLUCOSE 139 (H) 10/23/2019   BUN 16 10/23/2019   CREATININE 0.97 10/23/2019   CALCIUM 9.5 10/23/2019   PROT 6.5 10/23/2019   ALBUMIN 3.4 (L) 10/23/2019   AST 23 10/23/2019   ALT 10 10/23/2019   ALKPHOS 107 10/23/2019   BILITOT 0.5 10/23/2019   GFRNONAA >60 10/23/2019   GFRAA >60 10/23/2019    Lab Results  Component Value Date   WBC  5.7 10/23/2019   NEUTROABS 4.4 10/23/2019   HGB 10.1 (L) 10/23/2019   HCT 31.8 (L) 10/23/2019   MCV 108.5 (H) 10/23/2019   PLT 273 10/23/2019     STUDIES: DG OR UROLOGY CYSTO IMAGE (Manuel Garcia)  Result Date: 10/02/2019 There is no interpretation for this exam.  This order is for images obtained during a surgical procedure.  Please See "Surgeries" Tab for more information regarding the procedure.    ASSESSMENT: Stage IV cervical cancer.  PLAN:    1.  Stage IV cervical cancer: Biopsy results from April 24, 2019 confirmed the diagnosis. PET scan on July 10, 2019 reviewed independently with persistent metastatic disease outside the field for brachii therapy.  Patient completed cycle 3 of carboplatinum and Taxol on August 31, 2019.  Subsequent PET scan on September 17, 2019 revealed progressive disease.  Initial plan was to enroll in  clinical trial at [facility], but secondary to significant urine protein patient did not qualify.  PD-L1 was found to be positive (3%, CPS is greater than 1), therefore patient will initiate palliative Keytruda next week.  Patient has repeat CT scan scheduled for Monday.  Return to clinic the following day to initiate cycle 1 of treatment.   2.  Hydronephrosis: Patient had recent ureteral stent placed.  Will get ultrasound to further evaluate. 3.  Venous access: Patient now has had a port placed. 4.  Renal insufficiency: Resolved. 5.  Anemia: Hemoglobin improved to 10.1. 6.  Abdominal/flank pain: Possibly secondary to stent placement.  Ultrasound as above.  Patient's pain significantly improved during clinic.  Appreciate palliative care input.  7.  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  She will require Eliquis for minimum 6 months, but she will likely require extended treatment.  Patient's IVC filter has been removed.  8.  Thrombocytopenia: Resolved.  Patient expressed understanding and was in agreement with this plan. She also  understands that She can call clinic at any time with any questions, concerns, or complaints.   Cancer Staging Cervical cancer [facility]) Staging form: Cervix Uteri, AJCC 8th Edition - Clinical: FIGO Stage IIIB (cT3b, cN1, cM0) - Signed by Lloyd Huger, MD on 05/09/2019   Lloyd Huger, MD   10/23/2019 4:37 PM

## 2019-10-21 NOTE — Progress Notes (Signed)
DISCONTINUE OFF PATHWAY REGIMEN - Uterine   [id number]:Bevacizumab 15 mg/kg IV D1 + Carboplatin AUC=5 IV D1 + Paclitaxel 175 mg/m2 IV D1 q21 Days:   A cycle is every 21 days:     Bevacizumab-xxxx      Paclitaxel      Carboplatin   **Always confirm dose/schedule in your pharmacy ordering system**  REASON: Disease Progression PRIOR TREATMENT: Off Pathway: Bevacizumab 15 mg/kg IV D1 + Carboplatin AUC=5 IV D1 + Paclitaxel 175 mg/m2 IV D1 q21 Days TREATMENT RESPONSE: Progressive Disease (PD)  START ON PATHWAY REGIMEN - Uterine     A cycle is every 21 days:     Doxorubicin   **Always confirm dose/schedule in your pharmacy ordering system**  Patient Characteristics: Endometrioid Histology, Recurrent/Progressive Disease, Second Line, Systemic Recurrence, < 6 Months Since Previous Chemotherapy, MSI-H/dMMR or TMB-H Histology: Endometrioid Histology Therapeutic Status: Recurrent or Progressive Disease AJCC T Category: T3a AJCC N Category: N2a AJCC M Category: M1 AJCC 8 Stage Grouping: IVB Line of Therapy: Second Line Time to Recurrence: < 6 Months Since Previous Chemotherapy Microsatellite/Mismatch Repair Status: MSI-H/dMMR Tumor Mutational Burden (TMB): Unknown Intent of Therapy: Non-Curative / Palliative Intent, Discussed with Patient

## 2019-10-22 ENCOUNTER — Other Ambulatory Visit: Payer: Self-pay

## 2019-10-22 ENCOUNTER — Encounter: Payer: Self-pay | Admitting: Oncology

## 2019-10-22 NOTE — Progress Notes (Signed)
Patient prescreened for appointment. Patient has no concerns or questions.  

## 2019-10-23 ENCOUNTER — Other Ambulatory Visit: Payer: Self-pay

## 2019-10-23 ENCOUNTER — Inpatient Hospital Stay (HOSPITAL_BASED_OUTPATIENT_CLINIC_OR_DEPARTMENT_OTHER): Payer: BC Managed Care – PPO | Admitting: Oncology

## 2019-10-23 ENCOUNTER — Inpatient Hospital Stay: Payer: BC Managed Care – PPO

## 2019-10-23 ENCOUNTER — Inpatient Hospital Stay (HOSPITAL_BASED_OUTPATIENT_CLINIC_OR_DEPARTMENT_OTHER): Payer: BC Managed Care – PPO | Admitting: Hospice and Palliative Medicine

## 2019-10-23 ENCOUNTER — Other Ambulatory Visit: Payer: Self-pay | Admitting: Oncology

## 2019-10-23 ENCOUNTER — Encounter: Payer: Self-pay | Admitting: Oncology

## 2019-10-23 VITALS — BP 121/72 | HR 88 | Temp 96.1°F | Resp 22 | Wt 113.9 lb

## 2019-10-23 DIAGNOSIS — R630 Anorexia: Secondary | ICD-10-CM | POA: Diagnosis not present

## 2019-10-23 DIAGNOSIS — R531 Weakness: Secondary | ICD-10-CM | POA: Diagnosis not present

## 2019-10-23 DIAGNOSIS — Z515 Encounter for palliative care: Secondary | ICD-10-CM | POA: Diagnosis not present

## 2019-10-23 DIAGNOSIS — R634 Abnormal weight loss: Secondary | ICD-10-CM | POA: Diagnosis not present

## 2019-10-23 DIAGNOSIS — R110 Nausea: Secondary | ICD-10-CM | POA: Diagnosis not present

## 2019-10-23 DIAGNOSIS — Z86711 Personal history of pulmonary embolism: Secondary | ICD-10-CM | POA: Diagnosis not present

## 2019-10-23 DIAGNOSIS — Z86718 Personal history of other venous thrombosis and embolism: Secondary | ICD-10-CM | POA: Diagnosis not present

## 2019-10-23 DIAGNOSIS — N1330 Unspecified hydronephrosis: Secondary | ICD-10-CM | POA: Diagnosis not present

## 2019-10-23 DIAGNOSIS — C539 Malignant neoplasm of cervix uteri, unspecified: Secondary | ICD-10-CM

## 2019-10-23 DIAGNOSIS — R5383 Other fatigue: Secondary | ICD-10-CM | POA: Diagnosis not present

## 2019-10-23 DIAGNOSIS — Z79899 Other long term (current) drug therapy: Secondary | ICD-10-CM | POA: Diagnosis not present

## 2019-10-23 DIAGNOSIS — C538 Malignant neoplasm of overlapping sites of cervix uteri: Secondary | ICD-10-CM

## 2019-10-23 DIAGNOSIS — R109 Unspecified abdominal pain: Secondary | ICD-10-CM | POA: Diagnosis not present

## 2019-10-23 DIAGNOSIS — C530 Malignant neoplasm of endocervix: Secondary | ICD-10-CM | POA: Diagnosis not present

## 2019-10-23 DIAGNOSIS — G893 Neoplasm related pain (acute) (chronic): Secondary | ICD-10-CM

## 2019-10-23 LAB — CBC WITH DIFFERENTIAL/PLATELET
Abs Immature Granulocytes: 0.03 10*3/uL (ref 0.00–0.07)
Basophils Absolute: 0 10*3/uL (ref 0.0–0.1)
Basophils Relative: 1 %
Eosinophils Absolute: 0.1 10*3/uL (ref 0.0–0.5)
Eosinophils Relative: 2 %
HCT: 31.8 % — ABNORMAL LOW (ref 36.0–46.0)
Hemoglobin: 10.1 g/dL — ABNORMAL LOW (ref 12.0–15.0)
Immature Granulocytes: 1 %
Lymphocytes Relative: 7 %
Lymphs Abs: 0.4 10*3/uL — ABNORMAL LOW (ref 0.7–4.0)
MCH: 34.5 pg — ABNORMAL HIGH (ref 26.0–34.0)
MCHC: 31.8 g/dL (ref 30.0–36.0)
MCV: 108.5 fL — ABNORMAL HIGH (ref 80.0–100.0)
Monocytes Absolute: 0.6 10*3/uL (ref 0.1–1.0)
Monocytes Relative: 11 %
Neutro Abs: 4.4 10*3/uL (ref 1.7–7.7)
Neutrophils Relative %: 78 %
Platelets: 273 10*3/uL (ref 150–400)
RBC: 2.93 MIL/uL — ABNORMAL LOW (ref 3.87–5.11)
RDW: 13.8 % (ref 11.5–15.5)
WBC: 5.7 10*3/uL (ref 4.0–10.5)
nRBC: 0 % (ref 0.0–0.2)

## 2019-10-23 LAB — COMPREHENSIVE METABOLIC PANEL
ALT: 10 U/L (ref 0–44)
AST: 23 U/L (ref 15–41)
Albumin: 3.4 g/dL — ABNORMAL LOW (ref 3.5–5.0)
Alkaline Phosphatase: 107 U/L (ref 38–126)
Anion gap: 13 (ref 5–15)
BUN: 16 mg/dL (ref 6–20)
CO2: 22 mmol/L (ref 22–32)
Calcium: 9.5 mg/dL (ref 8.9–10.3)
Chloride: 102 mmol/L (ref 98–111)
Creatinine, Ser: 0.97 mg/dL (ref 0.44–1.00)
GFR calc Af Amer: >60 mL/min (ref >60)
GFR calc non Af Amer: >60 mL/min (ref >60)
Glucose, Bld: 139 mg/dL — ABNORMAL HIGH (ref 70–99)
Potassium: 3.2 mmol/L — ABNORMAL LOW (ref 3.5–5.1)
Sodium: 137 mmol/L (ref 135–145)
Total Bilirubin: 0.5 mg/dL (ref 0.3–1.2)
Total Protein: 6.5 g/dL (ref 6.5–8.1)

## 2019-10-23 MED ORDER — OXYCODONE HCL ER 15 MG PO T12A: 15.0000 mg | EXTENDED_RELEASE_TABLET | Freq: Two times a day (BID) | ORAL | 0 refills | Status: DC

## 2019-10-23 MED ORDER — OXYCODONE HCL 5 MG PO TABS: 5.0000 mg | ORAL_TABLET | ORAL | 0 refills | Status: DC | PRN

## 2019-10-23 MED ORDER — MIRTAZAPINE 7.5 MG PO TABS: 7.5000 mg | ORAL_TABLET | Freq: Every day | ORAL | 2 refills | Status: AC

## 2019-10-23 MED ORDER — TAMSULOSIN HCL 0.4 MG PO CAPS: 0.4000 mg | ORAL_CAPSULE | Freq: Every day | ORAL | 1 refills | Status: DC

## 2019-10-23 NOTE — Progress Notes (Signed)
ON PATHWAY REGIMEN - Uterine  No Change  Continue With Treatment as Ordered.     A cycle is every 21 days:     Doxorubicin   **Always confirm dose/schedule in your pharmacy ordering system**  Patient Characteristics: Endometrioid Histology, Recurrent/Progressive Disease, Second Line, Systemic Recurrence, < 6 Months Since Previous Chemotherapy, MSI-H/dMMR or TMB-H Histology: Endometrioid Histology Therapeutic Status: Recurrent or Progressive Disease AJCC T Category: T3a AJCC N Category: N2a AJCC M Category: M1 AJCC 8 Stage Grouping: IVB Line of Therapy: Second Line Time to Recurrence: < 6 Months Since Previous Chemotherapy Microsatellite/Mismatch Repair Status: MSI-H/dMMR Tumor Mutational Burden (TMB): Unknown Intent of Therapy: Non-Curative / Palliative Intent, Discussed with Patient

## 2019-10-23 NOTE — Progress Notes (Signed)
Pt to room via wheelchair, in obvious pain, restless. Requesting to be able to lie down to attempt to get more comfortable. Reports that she was having her baseline amount of pain this morning until she was getting ready to come for her appointment.

## 2019-10-23 NOTE — Progress Notes (Signed)
DISCONTINUE ON PATHWAY REGIMEN - Uterine     A cycle is every 21 days:     Doxorubicin   **Always confirm dose/schedule in your pharmacy ordering system**  REASON: Other Reason PRIOR TREATMENT: [id number]: Doxorubicin 50 mg/m2 IVP q21 Days Until Progression or Unacceptable Toxicity TREATMENT RESPONSE: Unable to Evaluate  START ON PATHWAY REGIMEN - Uterine     A cycle is 21 days:     Pembrolizumab   **Always confirm dose/schedule in your pharmacy ordering system**  Patient Characteristics: Endometrioid Histology, Recurrent/Progressive Disease, Second Line, Systemic Recurrence, < 6 Months Since Previous Chemotherapy, MSI-H/dMMR or TMB-H Histology: Endometrioid Histology Therapeutic Status: Recurrent or Progressive Disease AJCC T Category: T3a AJCC N Category: N2a AJCC M Category: M1 AJCC 8 Stage Grouping: IVB Line of Therapy: Second Line Time to Recurrence: < 6 Months Since Previous Chemotherapy Microsatellite/Mismatch Repair Status: MSI-H/dMMR Tumor Mutational Burden (TMB): Unknown Intent of Therapy: Non-Curative / Palliative Intent, Discussed with Patient

## 2019-10-23 NOTE — Progress Notes (Signed)
[place]  [phone] [phone]   Name: Mariah Mcmahon Date: 10/23/2019 MRN: [id number]  DOB: Oct 17, 1966  Patient Care Team: Venita Lick, NP as PCP - General (Nurse Practitioner) Clent Jacks, RN as Oncology Nurse Navigator    REASON FOR CONSULTATION: Palliative Care consult requested for this 53 y.o. 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  Patient is felt not to be a surgical candidate and treatment was initiated with concurrent chemoradiation.  Patient was hospitalized 06/05/2019 -06/07/2019 with acute left lower extremity DVT/pulmonary embolism status post thrombolysis and IVC filter placement.  IVC filter was later removed.  Patient had disease progression on carbo Taxol and was referred to [facility] for a clinical trial.  PET on 09/17/2019, which revealed interval progression of hypermetabolic nodal disease in chest, abdomen, and pelvis with new hypermetabolic disease in the region of the right vaginal fornix/cervix. Decision was made to proceed with systemic chemotherapy. Patient was referred to palliative care to help address goals and manage ongoing symptoms.  SOCIAL HISTORY:     reports that she quit smoking about 3 months ago. Her smoking use included cigarettes. She has a 15.00 pack-year smoking history. She has never used smokeless tobacco. She reports previous alcohol use. She reports that she does not use drugs.  Patient is not married.  She lives at home alone.  She has a son who is involved in her care, who lives nearby.  Patient previously worked at [employer] in [place] and recently stopped working due to [employer].  ADVANCE DIRECTIVES:  Does not have  CODE STATUS:   PAST MEDICAL HISTORY: Past Medical History:  Diagnosis Date  . Anemia   . Cancer ([place])    cwervical cancer    . Cervical cancer ([place])   . No pertinent past medical history     PAST SURGICAL HISTORY:  Past Surgical History:  Procedure Laterality Date  . CYSTOSCOPY W/ URETERAL STENT PLACEMENT Right 10/02/2019   Procedure: CYSTOSCOPY WITH RETROGRADE PYELOGRAM/URETERAL STENT PLACEMENT;  Surgeon: Hollice Espy, MD;  Location: [facility] ORS;  Service: Urology;  Laterality: Right;  . IVC FILTER REMOVAL N/A 08/07/2019   Procedure: IVC FILTER REMOVAL;  Surgeon: Katha Cabal, MD;  Location: [facility];  Service: Cardiovascular;  Laterality: N/A;  . MASS EXCISION     Throat  . PERIPHERAL VASCULAR THROMBECTOMY Left 06/05/2019   Procedure: PERIPHERAL VASCULAR THROMBECTOMY WITH IVC FILTER (LEFT LOWER EXTREMITY);  Surgeon: Katha Cabal, MD;  Location: [facility];  Service: Cardiovascular;  Laterality: Left;  . PORTA CATH INSERTION N/A 06/06/2019   Procedure: PORTA CATH INSERTION;  Surgeon: Katha Cabal, MD;  Location: [facility];  Service: Cardiovascular;  Laterality: N/A;  . TONSILLECTOMY      HEMATOLOGY/ONCOLOGY HISTORY:  Oncology History  Cervical cancer ([place])  05/04/2019 Initial Diagnosis   Cervical cancer ([place])   05/09/2019 Cancer Staging   Staging form: Cervix Uteri, AJCC 8th Edition - Clinical: FIGO Stage IIIB (cT3b, cN1, cM0) - Signed by Lloyd Huger, MD on 05/09/2019   05/17/2019 - 07/04/2019 Chemotherapy   The patient had palonosetron (ALOXI) injection 0.25 mg, 0.25 mg, Intravenous,  Once, 6 of 7 cycles Administration: 0.25 mg (05/17/2019), 0.25 mg (05/24/2019), 0.25 mg (05/31/2019), 0.25 mg (06/14/2019), 0.25 mg (06/22/2019), 0.25 mg (06/28/2019) CISplatin (PLATINOL) 61 mg in sodium chloride 0.9 %  250 mL chemo infusion, 40 mg/m2 = 61 mg, Intravenous,  Once, 6 of 7 cycles Administration: 61 mg (05/17/2019), 61 mg (05/24/2019), 61 mg (05/31/2019), 61 mg (06/14/2019), 61 mg (06/22/2019), 61 mg (06/28/2019) fosaprepitant (EMEND) 150 mg, dexamethasone (DECADRON) 12 mg in  sodium chloride 0.9 % 145 mL IVPB, , Intravenous,  Once, 6 of 7 cycles Administration:  (05/17/2019),  (05/24/2019),  (05/31/2019),  (06/14/2019),  (06/22/2019),  (06/28/2019)  for chemotherapy treatment.    07/20/2019 - 09/20/2019 Chemotherapy   The patient had palonosetron (ALOXI) injection 0.25 mg, 0.25 mg, Intravenous,  Once, 3 of 6 cycles Administration: 0.25 mg (07/20/2019), 0.25 mg (08/10/2019), 0.25 mg (08/31/2019) pegfilgrastim (NEULASTA ONPRO KIT) injection 6 mg, 6 mg, Subcutaneous, Once, 2 of 5 cycles Administration: 6 mg (07/20/2019), 6 mg (08/31/2019) CARBOplatin (PARAPLATIN) 520 mg in sodium chloride 0.9 % 250 mL chemo infusion, 520 mg (95.1 % of original dose 541.8 mg), Intravenous,  Once, 3 of 6 cycles Dose modification:   (original dose 541.8 mg, Cycle 1) Administration: 520 mg (07/20/2019), 520 mg (08/10/2019), 520 mg (08/31/2019) PACLitaxel (TAXOL) 300 mg in sodium chloride 0.9 % 250 mL chemo infusion (> '80mg'$ /m2), 200 mg/m2 = 300 mg, Intravenous,  Once, 3 of 6 cycles Administration: 300 mg (07/20/2019), 300 mg (08/10/2019), 300 mg (08/31/2019) fosaprepitant (EMEND) 150 mg, dexamethasone (DECADRON) 12 mg in sodium chloride 0.9 % 145 mL IVPB, , Intravenous,  Once, 3 of 6 cycles Administration:  (07/20/2019),  (08/10/2019),  (08/31/2019)  for chemotherapy treatment.    10/30/2019 -  Chemotherapy   The patient had DOXORUBICIN HCL CHEMO IV INJECTION 2 MG/ML, 50 mg/m2, Intravenous,  Once, 0 of 4 cycles PALONOSETRON HCL INJECTION 0.25 MG/5ML, 0.25 mg, Intravenous,  Once, 0 of 4 cycles  for chemotherapy treatment.      ALLERGIES:  has No Known Allergies.  MEDICATIONS:  Current Outpatient Medications  Medication Sig Dispense Refill  . ALPRAZolam (XANAX) 0.25 MG tablet Take 1 tablet (0.25 mg total) by mouth 2 (two) times daily as needed for anxiety. 45 tablet 0  . apixaban (ELIQUIS) 5 MG TABS tablet Take 1 tablet (5 mg total) by mouth 2 (two) times daily. 180 tablet 1  . mirabegron ER  (MYRBETRIQ) 50 MG TB24 tablet Take 50 mg by mouth daily. *Sample Med    . oxybutynin (DITROPAN-XL) 5 MG 24 hr tablet Take 1 tablet (5 mg total) by mouth at bedtime. TAKE 1 TABLET BY MOUTH EVERYDAY AT BEDTIME 30 tablet 0  . oxyCODONE (OXY IR/ROXICODONE) 5 MG immediate release tablet Take 0.5-1 tablets (2.5-5 mg total) by mouth every 6 (six) hours as needed for severe pain. 30 tablet 0  . tamsulosin (FLOMAX) 0.4 MG CAPS capsule Take 1 capsule (0.4 mg total) by mouth daily. 30 capsule 1   No current facility-administered medications for this visit.    VITAL SIGNS: There were no vitals taken for this visit. There were no vitals filed for this visit.  Estimated body mass index is 20.83 kg/m as calculated from the following:   Height as of 10/02/19: '5\' 2"'$  (1.575 m).   Weight as of an earlier encounter on 10/23/19: 113 lb 14.4 oz (51.7 kg).  LABS: CBC:    Component Value Date/Time   WBC 5.7 10/23/2019 1040   HGB 10.1 (L) 10/23/2019 1040   HGB 14.5 04/09/2019 0918   HCT 31.8 (L) 10/23/2019 1040   HCT 44.2 04/09/2019 0918   PLT 273 10/23/2019 1040   PLT 401 04/09/2019 0918   MCV 108.5 (H)  10/23/2019 1040   MCV 99 (H) 04/09/2019 0918   NEUTROABS 4.4 10/23/2019 1040   NEUTROABS 5.9 04/09/2019 0918   LYMPHSABS 0.4 (L) 10/23/2019 1040   LYMPHSABS 1.3 04/09/2019 0918   MONOABS 0.6 10/23/2019 1040   EOSABS 0.1 10/23/2019 1040   EOSABS 0.2 04/09/2019 0918   BASOSABS 0.0 10/23/2019 1040   BASOSABS 0.1 04/09/2019 0918   Comprehensive Metabolic Panel:    Component Value Date/Time   NA 137 10/23/2019 1040   NA 138 04/09/2019 0918   K 3.2 (L) 10/23/2019 1040   CL 102 10/23/2019 1040   CO2 22 10/23/2019 1040   BUN 16 10/23/2019 1040   BUN 12 04/09/2019 0918   CREATININE 0.97 10/23/2019 1040   GLUCOSE 139 (H) 10/23/2019 1040   CALCIUM 9.5 10/23/2019 1040   AST 23 10/23/2019 1040   ALT 10 10/23/2019 1040   ALKPHOS 107 10/23/2019 1040   BILITOT 0.5 10/23/2019 1040   BILITOT 0.4  04/09/2019 0918   PROT 6.5 10/23/2019 1040   PROT 6.8 04/09/2019 0918   ALBUMIN 3.4 (L) 10/23/2019 1040   ALBUMIN 4.0 04/09/2019 0918    RADIOGRAPHIC STUDIES: DG OR UROLOGY CYSTO IMAGE ([facility] ONLY)  Result Date: 10/02/2019 There is no interpretation for this exam.  This order is for images obtained during a surgical procedure.  Please See "Surgeries" Tab for more information regarding the procedure.    PERFORMANCE STATUS (ECOG) : 1 - Symptomatic but completely ambulatory  Review of Systems Unless otherwise noted, a complete review of systems is negative.  Physical Exam General: NAD, frail appearing, thin Pulmonary: Unlabored Extremities: no edema Skin: no rashes Neurological: Weakness but otherwise nonfocal  IMPRESSION: Patient with an acute add-on to my schedule today due to pain.  She has had progressive right flank pain over the past several weeks.  Pain is described as being sharp sometimes radiates to her left lower abdomen and down the right thigh.  Pain is often rated greater than 7 out of 10.  She says the pain is somewhat improved with use of oxycodone but that the pain medication is short-lived.  She is currently taking oxycodone 1-1.5 tablets every 6 hours.  She is not sleeping well due to pain.  We had previously ordered a CT of the abdomen and pelvis but the earliest appointment with next week.  We will discuss obtaining an ultrasound with Dr. Grayland Ormond so that we may better characterize the nature of her ureteral stent.  Patient may benefit from referral back to urology.  Regarding her pain regimen, will start her on a long-acting opioid.  Plan to continue and liberalize dosing of as needed oxycodone.  Will need prophylactic bowel regimen as needed.  Patient also in agreement with trial of mirtazapine to see if it helps with moods, sleep, and appetite.  We have previously discussed establishing ACP documents.  Will readdress during future visit.  Case discussed with  Dr. Grayland Ormond.  PDMP reviewed.   PLAN: -Continue current scope of treatment -Abdominal US  -CT A/P pending -Increase oxycodone to 5-'10mg'$  Q4H PRN (#60) -Start OxyContin '15mg'$  Q12H (#30) -Prophylactic bowel regimen with daily senna as needed -Mirtazapine 7.'5mg'$  QHS -Continue alprazolam 0.25 mg twice daily as needed -ACP documents and most form previously reviewed -RTC in next week   Patient expressed understanding and was in agreement with this plan. She also understands that She can call the clinic at any time with any questions, concerns, or complaints.     Time Total: 20 minutes  Visit consisted of counseling and education dealing with the complex and emotionally intense issues of symptom management and palliative care in the setting of serious and potentially life-threatening illness.Greater than 50%  of this time was spent counseling and coordinating care related to the above assessment and plan.  Signed by: Altha Harm, PhD, NP-C [phone] (Work Cell)

## 2019-10-24 ENCOUNTER — Telehealth: Payer: Self-pay

## 2019-10-24 NOTE — Telephone Encounter (Signed)
Copy of PD-L1 results sent to [facility] for scanning.

## 2019-10-25 NOTE — Progress Notes (Signed)
  [place]  [phone] [phone]  ID: Earney Mallet OB: 06/17/1966  MR#: [id number]  [id number]    Lloyd Huger, MD   10/25/2019 7:01 AM    This encounter was created in error - please disregard.

## 2019-10-29 ENCOUNTER — Inpatient Hospital Stay: Payer: BC Managed Care – PPO

## 2019-10-29 ENCOUNTER — Inpatient Hospital Stay (HOSPITAL_BASED_OUTPATIENT_CLINIC_OR_DEPARTMENT_OTHER): Payer: BC Managed Care – PPO | Admitting: Oncology

## 2019-10-29 ENCOUNTER — Ambulatory Visit: Admission: RE | Admit: 2019-10-29 | Payer: BC Managed Care – PPO | Source: Ambulatory Visit

## 2019-10-29 ENCOUNTER — Telehealth: Payer: Self-pay | Admitting: *Deleted

## 2019-10-29 ENCOUNTER — Inpatient Hospital Stay: Payer: BC Managed Care – PPO | Admitting: *Deleted

## 2019-10-29 ENCOUNTER — Ambulatory Visit
Admission: RE | Admit: 2019-10-29 | Discharge: 2019-10-29 | Disposition: A | Discharge status: Home / Self Care | Payer: BC Managed Care – PPO | Source: Ambulatory Visit | Attending: Oncology | Admitting: Oncology

## 2019-10-29 ENCOUNTER — Other Ambulatory Visit: Payer: Self-pay

## 2019-10-29 VITALS — BP 108/74 | HR 96 | Temp 97.4°F | Resp 18

## 2019-10-29 DIAGNOSIS — R531 Weakness: Secondary | ICD-10-CM | POA: Diagnosis not present

## 2019-10-29 DIAGNOSIS — C539 Malignant neoplasm of cervix uteri, unspecified: Secondary | ICD-10-CM | POA: Diagnosis not present

## 2019-10-29 DIAGNOSIS — R110 Nausea: Secondary | ICD-10-CM

## 2019-10-29 DIAGNOSIS — G893 Neoplasm related pain (acute) (chronic): Secondary | ICD-10-CM

## 2019-10-29 DIAGNOSIS — Z79899 Other long term (current) drug therapy: Secondary | ICD-10-CM | POA: Diagnosis not present

## 2019-10-29 DIAGNOSIS — R5383 Other fatigue: Secondary | ICD-10-CM | POA: Diagnosis not present

## 2019-10-29 DIAGNOSIS — R634 Abnormal weight loss: Secondary | ICD-10-CM | POA: Diagnosis not present

## 2019-10-29 DIAGNOSIS — F419 Anxiety disorder, unspecified: Secondary | ICD-10-CM

## 2019-10-29 DIAGNOSIS — Z86711 Personal history of pulmonary embolism: Secondary | ICD-10-CM | POA: Diagnosis not present

## 2019-10-29 DIAGNOSIS — Z86718 Personal history of other venous thrombosis and embolism: Secondary | ICD-10-CM | POA: Diagnosis not present

## 2019-10-29 DIAGNOSIS — Z95828 Presence of other vascular implants and grafts: Secondary | ICD-10-CM

## 2019-10-29 DIAGNOSIS — C530 Malignant neoplasm of endocervix: Secondary | ICD-10-CM | POA: Diagnosis not present

## 2019-10-29 DIAGNOSIS — R630 Anorexia: Secondary | ICD-10-CM | POA: Diagnosis not present

## 2019-10-29 DIAGNOSIS — R109 Unspecified abdominal pain: Secondary | ICD-10-CM | POA: Diagnosis not present

## 2019-10-29 LAB — COMPREHENSIVE METABOLIC PANEL
ALT: 9 U/L (ref 0–44)
AST: 25 U/L (ref 15–41)
Albumin: 3.5 g/dL (ref 3.5–5.0)
Alkaline Phosphatase: 100 U/L (ref 38–126)
Anion gap: 8 (ref 5–15)
BUN: 14 mg/dL (ref 6–20)
CO2: 26 mmol/L (ref 22–32)
Calcium: 9.4 mg/dL (ref 8.9–10.3)
Chloride: 100 mmol/L (ref 98–111)
Creatinine, Ser: 0.81 mg/dL (ref 0.44–1.00)
GFR calc Af Amer: >60 mL/min (ref >60)
GFR calc non Af Amer: >60 mL/min (ref >60)
Glucose, Bld: 133 mg/dL — ABNORMAL HIGH (ref 70–99)
Potassium: 3.8 mmol/L (ref 3.5–5.1)
Sodium: 134 mmol/L — ABNORMAL LOW (ref 135–145)
Total Bilirubin: 0.4 mg/dL (ref 0.3–1.2)
Total Protein: 6.9 g/dL (ref 6.5–8.1)

## 2019-10-29 LAB — CBC WITH DIFFERENTIAL/PLATELET
Abs Immature Granulocytes: 0.02 10*3/uL (ref 0.00–0.07)
Basophils Absolute: 0 10*3/uL (ref 0.0–0.1)
Basophils Relative: 0 %
Eosinophils Absolute: 0 10*3/uL (ref 0.0–0.5)
Eosinophils Relative: 1 %
HCT: 31.2 % — ABNORMAL LOW (ref 36.0–46.0)
Hemoglobin: 9.8 g/dL — ABNORMAL LOW (ref 12.0–15.0)
Immature Granulocytes: 0 %
Lymphocytes Relative: 8 %
Lymphs Abs: 0.5 10*3/uL — ABNORMAL LOW (ref 0.7–4.0)
MCH: 33.3 pg (ref 26.0–34.0)
MCHC: 31.4 g/dL (ref 30.0–36.0)
MCV: 106.1 fL — ABNORMAL HIGH (ref 80.0–100.0)
Monocytes Absolute: 0.5 10*3/uL (ref 0.1–1.0)
Monocytes Relative: 8 %
Neutro Abs: 5.6 10*3/uL (ref 1.7–7.7)
Neutrophils Relative %: 83 %
Platelets: 346 10*3/uL (ref 150–400)
RBC: 2.94 MIL/uL — ABNORMAL LOW (ref 3.87–5.11)
RDW: 14.6 % (ref 11.5–15.5)
WBC: 6.7 10*3/uL (ref 4.0–10.5)
nRBC: 0 % (ref 0.0–0.2)

## 2019-10-29 MED ORDER — SODIUM CHLORIDE 0.9 % IV SOLN
Freq: Once | INTRAVENOUS | Status: AC
  Filled 2019-10-29: qty 250

## 2019-10-29 MED ORDER — ONDANSETRON HCL 4 MG/2ML IJ SOLN
INTRAMUSCULAR | Status: AC
  Filled 2019-10-29: qty 4

## 2019-10-29 MED ORDER — IOHEXOL 300 MG/ML  SOLN
100.0000 mL | Freq: Once | INTRAMUSCULAR | Status: AC | PRN
  Administered 2019-10-29: 100 mL via INTRAVENOUS

## 2019-10-29 MED ORDER — SODIUM CHLORIDE 0.9% FLUSH
10.0000 mL | Freq: Once | INTRAVENOUS | Status: AC
  Administered 2019-10-29: 14:00:00 10 mL via INTRAVENOUS
  Filled 2019-10-29: qty 10

## 2019-10-29 MED ORDER — ONDANSETRON HCL 4 MG/2ML IJ SOLN
8.0000 mg | Freq: Once | INTRAMUSCULAR | Status: AC
  Administered 2019-10-29: 8 mg via INTRAVENOUS

## 2019-10-29 MED ORDER — DEXAMETHASONE SODIUM PHOSPHATE 10 MG/ML IJ SOLN
10.0000 mg | Freq: Once | INTRAMUSCULAR | Status: AC
  Administered 2019-10-29: 10 mg via INTRAVENOUS

## 2019-10-29 MED ORDER — DEXAMETHASONE SODIUM PHOSPHATE 10 MG/ML IJ SOLN
INTRAMUSCULAR | Status: AC
  Filled 2019-10-29: qty 1

## 2019-10-29 NOTE — Progress Notes (Signed)
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  Chief complaint/ Reason for visit-weakness, pain.  Heme/Onc history:  Oncology History  Cervical cancer ([place])  05/04/2019 Initial Diagnosis   Cervical cancer ([place])   05/09/2019 Cancer Staging   Staging form: Cervix Uteri, AJCC 8th Edition - Clinical: FIGO Stage IIIB (cT3b, cN1, cM0) - Signed by Lloyd Huger, MD on 05/09/2019   05/17/2019 - 07/04/2019 Chemotherapy   The patient had palonosetron (ALOXI) injection 0.25 mg, 0.25 mg, Intravenous,  Once, 6 of 7 cycles Administration: 0.25 mg (05/17/2019), 0.25 mg (05/24/2019), 0.25 mg (05/31/2019), 0.25 mg (06/14/2019), 0.25 mg (06/22/2019), 0.25 mg (06/28/2019) CISplatin (PLATINOL) 61 mg in sodium chloride 0.9 % 250 mL chemo infusion, 40 mg/m2 = 61 mg, Intravenous,  Once, 6 of 7 cycles Administration: 61 mg (05/17/2019), 61 mg (05/24/2019), 61 mg (05/31/2019), 61 mg (06/14/2019), 61 mg (06/22/2019), 61 mg (06/28/2019) fosaprepitant (EMEND) 150 mg, dexamethasone (DECADRON) 12 mg in sodium chloride 0.9 % 145 mL IVPB, , Intravenous,  Once, 6 of 7 cycles Administration:  (05/17/2019),  (05/24/2019),  (05/31/2019),  (06/14/2019),  (06/22/2019),  (06/28/2019)  for chemotherapy treatment.    07/20/2019 - 09/20/2019 Chemotherapy   The patient had palonosetron (ALOXI) injection 0.25 mg, 0.25 mg, Intravenous,  Once, 3 of 6 cycles Administration: 0.25 mg (07/20/2019), 0.25 mg (08/10/2019), 0.25 mg (08/31/2019) pegfilgrastim (NEULASTA ONPRO KIT) injection 6 mg, 6 mg, Subcutaneous, Once, 2 of 5 cycles Administration: 6 mg (07/20/2019), 6 mg  (08/31/2019) CARBOplatin (PARAPLATIN) 520 mg in sodium chloride 0.9 % 250 mL chemo infusion, 520 mg (95.1 % of original dose 541.8 mg), Intravenous,  Once, 3 of 6 cycles Dose modification:   (original dose 541.8 mg, Cycle 1) Administration: 520 mg (07/20/2019), 520 mg (08/10/2019), 520 mg (08/31/2019) PACLitaxel (TAXOL) 300 mg in sodium chloride 0.9 % 250 mL chemo infusion (> '80mg'$ /m2), 200 mg/m2 = 300 mg, Intravenous,  Once, 3 of 6 cycles Administration: 300 mg (07/20/2019), 300 mg (08/10/2019), 300 mg (08/31/2019) fosaprepitant (EMEND) 150 mg, dexamethasone (DECADRON) 12 mg in sodium chloride 0.9 % 145 mL IVPB, , Intravenous,  Once, 3 of 6 cycles Administration:  (07/20/2019),  (08/10/2019),  (08/31/2019)  for chemotherapy treatment.    10/30/2019 -  Chemotherapy   The patient had pembrolizumab (KEYTRUDA) 200 mg in sodium chloride 0.9 % 50 mL chemo infusion, 200 mg, Intravenous, Once, 0 of 6 cycles  for chemotherapy treatment.      Interval history-patient presents to symptom management clinic today with the above history for complaints of generalized weakness, fatigue and abdominal pain.  Patient was recently evaluated in palliative medicine for right flank pain.  She was started on OxyContin 15 mg every 12 and encouraged to continue oxycodone 5 to 10 mg every 4.  She was scheduled to have a CT scan completed this morning but unfortunately was unable to make it due to pain and generalized weakness.  We recommended ED evaluation but patient would like to be seen in our clinic.  Patient recently had right stent placed for hydronephrosis by Dr. Erlene Quan on 10/02/2019.  Tolerated procedure well.  Today, patient states she has intermittent  10 out of 10 right sided flank pain that radiates to her pelvis and down her right leg that is debilitating.  Symptoms began approximately after stent placed on 10/02/2019.  She often becomes nauseated and experiences dry heaves.  She has not vomited.  States "I feel  like  am going to pass out".  She recently started taking her long-acting narcotic OxyContin which she thought was helping with her pain.  She denies any chest pain or shortness of breath.  Her activity level is minimal.  Her appetite has decreased but she forces herself to eat.  Feels she is drinking plenty of fluids.  Has had a recent bowel movement and is denies any urinary concerns.  Unfortunately, did not qualify for [facility] clinical trial due to significant protein in urine.  PDL-1 was found to be positive at 3% so plan is to proceed with Keytruda.   She will proceed with doxorubicin every 3 weeks off clinical trial.  ECOG FS:3 - Symptomatic, >50% confined to bed  Review of systems- Review of Systems  Constitutional: Negative.  Negative for chills, fever, malaise/fatigue and weight loss.  HENT: Negative for congestion, ear pain and tinnitus.   Eyes: Negative.  Negative for blurred vision and double vision.  Respiratory: Negative.  Negative for cough, sputum production and shortness of breath.   Cardiovascular: Negative.  Negative for chest pain, palpitations and leg swelling.  Gastrointestinal: Positive for abdominal pain (Right flank and suprapubic radiating down right leg). Negative for constipation, diarrhea, nausea and vomiting.  Genitourinary: Negative for dysuria, frequency and urgency.  Musculoskeletal: Negative for back pain and falls.  Skin: Negative.  Negative for rash.  Neurological: Negative.  Negative for weakness and headaches.  Endo/Heme/Allergies: Negative.  Does not bruise/bleed easily.  Psychiatric/Behavioral: Negative.  Negative for depression. The patient is not nervous/anxious and does not have insomnia.      Current treatment- s/p 3 cycles of carbo/Taxol.  Last given on 08/31/2019. doxorubicin every 21 days scheduled to begin tomorrow.  No Known Allergies   Past Medical History:  Diagnosis Date  . Anemia   . Cancer ([place])    cwervical cancer  . Cervical cancer  ([place])   . No pertinent past medical history      Past Surgical History:  Procedure Laterality Date  . CYSTOSCOPY W/ URETERAL STENT PLACEMENT Right 10/02/2019   Procedure: CYSTOSCOPY WITH RETROGRADE PYELOGRAM/URETERAL STENT PLACEMENT;  Surgeon: Hollice Espy, MD;  Location: [facility] ORS;  Service: Urology;  Laterality: Right;  . IVC FILTER REMOVAL N/A 08/07/2019   Procedure: IVC FILTER REMOVAL;  Surgeon: Katha Cabal, MD;  Location: [place] [facility];  Service: Cardiovascular;  Laterality: N/A;  . MASS EXCISION     Throat  . PERIPHERAL VASCULAR THROMBECTOMY Left 06/05/2019   Procedure: PERIPHERAL VASCULAR THROMBECTOMY WITH IVC FILTER (LEFT LOWER EXTREMITY);  Surgeon: Katha Cabal, MD;  Location: [place] [facility];  Service: Cardiovascular;  Laterality: Left;  . PORTA CATH INSERTION N/A 06/06/2019   Procedure: PORTA CATH INSERTION;  Surgeon: Katha Cabal, MD;  Location: [place] [facility];  Service: Cardiovascular;  Laterality: N/A;  . TONSILLECTOMY      Social History   Socioeconomic History  . Marital status: Divorced    Spouse name: Not on file  . Number of children: Not on file  . Years of education: Not on file  . Highest education level: Not on file  Occupational History  . Occupation: [employer]    Employer: [employer]  Tobacco Use  . Smoking status: Former Smoker    Packs/day: 0.50    Years: 30.00    Pack years: 15.00    Types: Cigarettes    Quit date: 07/03/2019    Years since quitting: 0.3  . Smokeless tobacco: Never Used  Substance and Sexual Activity  . Alcohol use: Not Currently  . Drug use: Never  . Sexual activity: Yes    Comment: not at moment  Other Topics Concern  . Not on file  Social History Narrative  . Not on file   Social Determinants of Health   Financial Resource Strain: Low Risk   . Difficulty of Paying Living Expenses: Not hard at all  Food Insecurity: No Food Insecurity  . Worried About Sales executive in the Last Year: Never true  . Ran Out of Food in the Last Year: Never true  Transportation Needs: No Transportation Needs  . Lack of Transportation (Medical): No  . Lack of Transportation (Non-Medical): No  Physical Activity: Sufficiently Active  . Days of Exercise per Week: 6 days  . Minutes of Exercise per Session: 40 min  Stress: No Stress Concern Present  . Feeling of Stress : Not at all  Social Connections: Moderately Isolated  . Frequency of Communication with Friends and Family: More than three times a week  . Frequency of Social Gatherings with Friends and Family: More than three times a week  . Attends Religious Services: Never  . Active Member of Clubs or Organizations: No  . Attends Archivist Meetings: Never  . Marital Status: Divorced  Human resources officer Violence: Not At Risk  . Fear of Current or Ex-Partner: No  . Emotionally Abused: No  . Physically Abused: No  . Sexually Abused: No    Family History  Problem Relation Age of Onset  . Brain cancer Mother 6  . Alcohol abuse Father   . Heart disease Father   . Prostate cancer Father   . Heart attack Father   . Diabetes Father      Current Outpatient Medications:  .  ALPRAZolam (XANAX) 0.25 MG tablet, Take 1 tablet (0.25 mg total) by mouth 2 (two) times daily as needed for anxiety., Disp: 45 tablet, Rfl: 0 .  apixaban (ELIQUIS) 5 MG TABS tablet, Take 1 tablet (5 mg total) by mouth 2 (two) times daily., Disp: 180 tablet, Rfl: 1 .  mirabegron ER (MYRBETRIQ) 50 MG TB24 tablet, Take 50 mg by mouth daily. *Sample Med, Disp: , Rfl:  .  mirtazapine (REMERON) 7.5 MG tablet, Take 1 tablet (7.5 mg total) by mouth at bedtime., Disp: 30 tablet, Rfl: 2 .  oxybutynin (DITROPAN-XL) 5 MG 24 hr tablet, Take 1 tablet (5 mg total) by mouth at bedtime. TAKE 1 TABLET BY MOUTH EVERYDAY AT BEDTIME, Disp: 30 tablet, Rfl: 0 .  oxyCODONE (OXY IR/ROXICODONE) 5 MG immediate release tablet, Take 1-2 tablets (5-10 mg total) by  mouth every 4 (four) hours as needed for severe pain., Disp: 60 tablet, Rfl: 0 .  oxyCODONE (OXYCONTIN) 15 mg 12 hr tablet, Take 1 tablet (15 mg total) by mouth every 12 (twelve) hours., Disp: 30 tablet, Rfl: 0 .  tamsulosin (FLOMAX) 0.4 MG CAPS capsule, Take 1 capsule (0.4 mg total) by mouth daily., Disp: 30 capsule, Rfl: 1  Physical exam:  Vitals:   10/29/19 1343  BP: 108/74  Pulse: 96  Resp: 18  Temp: (!) 97.4 F (36.3 C)  TempSrc: Tympanic  SpO2: 98%  Physical Exam Constitutional:      Appearance: Normal appearance.  HENT:     Head: Normocephalic and atraumatic.  Eyes:     Pupils: Pupils are equal, round, and reactive to light.  Cardiovascular:     Rate and Rhythm: Normal rate and regular rhythm.     Heart sounds: Normal heart sounds. No murmur.  Pulmonary:     Effort: Pulmonary effort is normal.     Breath sounds: Normal breath sounds. No wheezing.  Abdominal:     General: Bowel sounds are normal. There is no distension.     Palpations: Abdomen is soft.     Tenderness: There is abdominal tenderness in the right lower quadrant.  Musculoskeletal:        General: Normal range of motion.     Cervical back: Normal range of motion.  Skin:    General: Skin is warm and dry.     Findings: No rash.  Neurological:     Mental Status: She is alert and oriented to person, place, and time.  Psychiatric:        Judgment: Judgment normal.      CMP Latest Ref Rng & Units 10/29/2019  Glucose 70 - 99 mg/dL 133(H)  BUN 6 - 20 mg/dL 14  Creatinine 0.44 - 1.00 mg/dL 0.81  Sodium 135 - 145 mmol/L 134(L)  Potassium 3.5 - 5.1 mmol/L 3.8  Chloride 98 - 111 mmol/L 100  CO2 22 - 32 mmol/L 26  Calcium 8.9 - 10.3 mg/dL 9.4  Total Protein 6.5 - 8.1 g/dL 6.9  Total Bilirubin 0.3 - 1.2 mg/dL 0.4  Alkaline Phos 38 - 126 U/L 100  AST 15 - 41 U/L 25  ALT 0 - 44 U/L 9   CBC Latest Ref Rng & Units 10/29/2019  WBC 4.0 - 10.5 K/uL 6.7  Hemoglobin 12.0 - 15.0 g/dL 9.8(L)  Hematocrit 36.0 -  46.0 % 31.2(L)  Platelets 150 - 400 K/uL 346    No images are attached to the encounter.  CT ABDOMEN PELVIS W CONTRAST  Result Date: 10/29/2019 CLINICAL DATA:  53 year old female with history of cervical cancer. History of right ureteral stent placed on 10/02/2019. Abdominal pain since that time. EXAM: CT ABDOMEN AND PELVIS WITH CONTRAST TECHNIQUE: Multidetector CT imaging of the abdomen and pelvis was performed using the standard protocol following bolus administration of intravenous contrast. CONTRAST:  129m OMNIPAQUE IOHEXOL 300 MG/ML  SOLN COMPARISON:  CT the chest, abdomen and pelvis 05/01/2019. FINDINGS: Lower chest: Mild scarring or atelectasis in the lung bases bilaterally. Hepatobiliary: The posterior aspect of the right lobe of the liver involving portions of both segments 6 and 7 there is a 3.5 x 1.4 cm peripheral hypovascular lesion which is favored to represent a capsular implant. Similarly, there is a smaller capsular lesion overlying segment 6 (axial image 22 of series 2). No intra or extrahepatic biliary ductal dilatation. Small non dependent 5 mm lesion in the gallbladder, favored to represent a gallbladder polyp. Pancreas: No pancreatic mass. No pancreatic ductal dilatation. No pancreatic or peripancreatic fluid collections or inflammatory changes. Spleen: Unremarkable. Adrenals/Urinary Tract: In the upper pole of the right kidney there is a 9 mm low-attenuation lesion, in both kidneys there are subcentimeter low-attenuation lesions, too small to characterize, but favored to represent cysts. Right-sided double-J ureteral stent which appears properly located. Mild right hydroureteronephrosis despite the presence of a stent. No left-sided hydroureteronephrosis. Urinary bladder is normal in appearance. Stomach/Bowel: Normal appearance of the stomach. No pathologic dilatation  of small bowel or colon. The appendix is not confidently identified and may be surgically absent. Regardless, there  are no inflammatory changes noted adjacent to the cecum to suggest the presence of an acute appendicitis at this time. Vascular/Lymphatic: Aortic atherosclerosis, without evidence of aneurysm or dissection in the abdominal or pelvic vasculature. Left common iliac vein stent which appears patent. Abnormal soft tissue in the cul-de-sac immediately anterior to the distal sigmoid colon and/or proximal rectum best appreciated on axial image 63 of series 2 and coronal image 85 of series 4 measuring 2.4 x 1.6 x 2.6 cm, and on axial image 62 of series 2 and coronal image 85 of series 4 measuring 1.7 x 1.2 x 2.6 cm, compatible with lymph nodes and/or metastatic intraperitoneal implants. Likewise, some abnormal soft tissue is noted along the right pelvic sidewall adjacent to the distal third of the right ureter measuring approximately 2.8 x 2.1 x 3.3 cm (axial image 55 of series 2 and coronal image 71 of series 4), likely an enlarged lymph node. These all appear increased compared to the prior study. Other lymph nodes appear smaller than the prior examination, including a right obturator lymph node (axial image 63 of series 2) which currently measures only 9 mm in short axis (previously 2.2 cm) additionally, previously noted bulky retroperitoneal lymphadenopathy has largely resolved, now with some amorphous soft tissue in the retroperitoneum encasing the abdominal aorta best appreciated on axial image 27 of series 2. The largest single retroperitoneal lymph node identified on today's examination measures 1 cm in short axis in the left para-aortic nodal station (axial image 25 of series 2). Reproductive: The uterus has further enlarged and is more heterogeneous in appearance than the prior examination, currently measuring approximately 15.0 x 6.4 x 8.0 cm. Diffuse infiltration of the uterus with multiple hypovascular masses, which also demonstrate extensive involvement of the lower uterine segment and cervical region,  particularly anteriorly. There is a tiny locule of gas in the vaginal apex, likely related to recent pelvic examination. Other: Trace volume of ascites, slightly increased compared to the prior examination. No pneumoperitoneum. Musculoskeletal: Tiny sclerotic lesion in the right parasymphyseal region and mixed lucent and sclerotic lesion in the left sacrum and adjacent left ilium all appear similar to the prior examination, and were not hypermetabolic on prior PET-CT, suggesting benign lesions. There are no other aggressive appearing lytic or blastic lesions noted in the visualized portions of the skeleton. IMPRESSION: 1. Today's study demonstrates a mixed response to therapy with further enlargement of the lesions throughout the cervix and uterus, some increasing lymphadenopathy along the right pelvic sidewall adjacent to the distal third of the right ureter, enlarging lymph nodes and/or metastatic deposits in the cul-de-sac, and new capsular implants associated with the right lobe of the liver with slight increase in trace volume of ascites. Other bulky lymphadenopathy in the pelvis and retroperitoneum has regressed compared to the prior examination. 2. Right-sided double-J ureteral stent appears appropriately located. Despite the presence of the stent there is persistent mild right hydroureteronephrosis. 3. Aortic atherosclerosis. 4. Additional incidental findings, as above. Electronically Signed   By: Vinnie Langton M.D.   On: 10/29/2019 16:27   DG OR UROLOGY CYSTO IMAGE ([place])  Result Date: 10/02/2019 There is no interpretation for this exam.  This order is for images obtained during a surgical procedure.  Please See "Surgeries" Tab for more information regarding the procedure.   Assessment and plan- Patient is a 53 y.o. female who presents to symptom  management for complaints of intermittent right flank radiating to right lower quadrant pain rating 10 out of 10 causing nausea/dry heaves.  Pain is  been present since having stent placed by Dr. Erlene Quan on 10/02/2019.  Stage IIIb squamous cell cancer of endocervix with possible mets to the lung and sacrum: Not a good surgical candidate.  Completed concurrent chemoradiation.  Unfortunately had progression on carbo/Taxol was referred to [facility] for clinical trial.  Did not qualify for clinical trial due to proteinuria.  PET on 09/17/2019 revealed interval progression of hypermetabolic nodal disease in chest, abdomen and pelvis with new hypermetabolic disease in the region of the right vaginal fornix/cervix.  Decision was made to proceed with systemic chemotherapy.  She will begin palliative Keytruda tomorrow.  PDL-1 3%.  History of left DVT/pulmonary embolus: Status post thrombolysis and IVC filter.  Right flank pain radiating to right suprapubic and right leg: Describes a 10 out of 10 at times.  Currently using oxycodone 1 to 2 tablets every 4 hours as needed for pain and added OxyContin 15 mg every 12.  States she feels like it is helping but she continues to have episodes of severe pain.  Spoke with [employer] and he recommends increasing OxyContin 15 mg every 12 to every 8 hours.  Unclear etiology.  Likely multifactorial.  Will reschedule CT scan to identify patency of stent and to evaluate disease.  CT abdomen/pelvis without oral contrast revealed mixed response to therapy.  Several areas appeared enlarged throughout the cervix and uterus and increasing lymphadenopathy throughout the pelvis.  Small implants in right lobe of liver with a slight increase in a trace volume of ascites.  Previously identified lymphadenopathy in the pelvis and retroperitoneum has become smaller.  Right-sided double-J ureteral stent appears patent.  Persistent mild hydroureteronephrosis.   Plan: Stat labs. (NA 134, anemia 9.8) Vital signs.  Stable. Give 1 L NaCl. Give 10 mg Decadron IV Give 8 mg Zofran IV Increase OxyContin 15 mg from every 12 hours to every 8 hours  daily. Reschedule CT abdomen without oral contrast.  This was scheduled for today at 315.  Disposition: RTC tomorrow as scheduled for possible Keytruda and results from CT scan.    Visit Diagnosis 1. Malignant neoplasm of cervix, unspecified site [facility])     Patient expressed understanding and was in agreement with this plan. She also understands that She can call clinic at any time with any questions, concerns, or complaints.   Greater than 50% was spent in counseling and coordination of care with this patient including but not limited to discussion of the relevant topics above (See A&P) including, but not limited to diagnosis and management of acute and chronic medical conditions.   Thank you for allowing me to participate in the care of this very pleasant patient.    Jacquelin Hawking, NP [place] [facility] Cell - [phone] Pager- [phone] 10/30/2019 10:29 AM

## 2019-10-29 NOTE — Telephone Encounter (Signed)
Call returned to Mariah Mcmahon and she states she cannot get patient to even open the door to let her in her house and she had refused to call EMS.  I called and spoke with patient and she has agreed to try to get a ride to come in at 1:00 for lab, [facility]

## 2019-10-29 NOTE — Telephone Encounter (Signed)
We would be happy to see her but if she is that sick I would recommend EMS  or ED evaluation.   Faythe Casa, NP 10/29/2019 11:20 AM

## 2019-10-29 NOTE — Telephone Encounter (Signed)
Patient aunt called reporting that patient is too sick to come in for her CT this morning. She cannot get out of bed, she is having pain and is very nauseated so that she cannot drink her contrast. She states she is going to call and cancel her CT appointment for today. Please advise

## 2019-10-29 NOTE — Progress Notes (Signed)
Patient pre screened she reported that her pain had increased this morning.

## 2019-10-30 ENCOUNTER — Inpatient Hospital Stay: Payer: BC Managed Care – PPO

## 2019-10-30 ENCOUNTER — Other Ambulatory Visit: Payer: Self-pay

## 2019-10-30 ENCOUNTER — Inpatient Hospital Stay (HOSPITAL_BASED_OUTPATIENT_CLINIC_OR_DEPARTMENT_OTHER): Payer: BC Managed Care – PPO | Admitting: Hospice and Palliative Medicine

## 2019-10-30 ENCOUNTER — Inpatient Hospital Stay: Payer: BC Managed Care – PPO | Admitting: Oncology

## 2019-10-30 ENCOUNTER — Inpatient Hospital Stay (HOSPITAL_BASED_OUTPATIENT_CLINIC_OR_DEPARTMENT_OTHER): Payer: BC Managed Care – PPO | Admitting: Oncology

## 2019-10-30 ENCOUNTER — Inpatient Hospital Stay: Payer: BC Managed Care – PPO | Admitting: *Deleted

## 2019-10-30 VITALS — BP 124/77 | HR 79 | Temp 99.2°F | Wt 116.5 lb

## 2019-10-30 DIAGNOSIS — C530 Malignant neoplasm of endocervix: Secondary | ICD-10-CM | POA: Diagnosis not present

## 2019-10-30 DIAGNOSIS — Z86718 Personal history of other venous thrombosis and embolism: Secondary | ICD-10-CM | POA: Diagnosis not present

## 2019-10-30 DIAGNOSIS — Z515 Encounter for palliative care: Secondary | ICD-10-CM | POA: Diagnosis not present

## 2019-10-30 DIAGNOSIS — R630 Anorexia: Secondary | ICD-10-CM | POA: Diagnosis not present

## 2019-10-30 DIAGNOSIS — Z7189 Other specified counseling: Secondary | ICD-10-CM | POA: Diagnosis not present

## 2019-10-30 DIAGNOSIS — R634 Abnormal weight loss: Secondary | ICD-10-CM | POA: Diagnosis not present

## 2019-10-30 DIAGNOSIS — R809 Proteinuria, unspecified: Secondary | ICD-10-CM | POA: Diagnosis not present

## 2019-10-30 DIAGNOSIS — Z86711 Personal history of pulmonary embolism: Secondary | ICD-10-CM | POA: Diagnosis not present

## 2019-10-30 DIAGNOSIS — R531 Weakness: Secondary | ICD-10-CM | POA: Diagnosis not present

## 2019-10-30 DIAGNOSIS — G893 Neoplasm related pain (acute) (chronic): Secondary | ICD-10-CM | POA: Diagnosis not present

## 2019-10-30 DIAGNOSIS — N1330 Unspecified hydronephrosis: Secondary | ICD-10-CM | POA: Diagnosis not present

## 2019-10-30 DIAGNOSIS — Z95828 Presence of other vascular implants and grafts: Secondary | ICD-10-CM

## 2019-10-30 DIAGNOSIS — C539 Malignant neoplasm of cervix uteri, unspecified: Secondary | ICD-10-CM

## 2019-10-30 DIAGNOSIS — R5383 Other fatigue: Secondary | ICD-10-CM | POA: Diagnosis not present

## 2019-10-30 DIAGNOSIS — C538 Malignant neoplasm of overlapping sites of cervix uteri: Secondary | ICD-10-CM

## 2019-10-30 DIAGNOSIS — Z79899 Other long term (current) drug therapy: Secondary | ICD-10-CM | POA: Diagnosis not present

## 2019-10-30 DIAGNOSIS — R109 Unspecified abdominal pain: Secondary | ICD-10-CM | POA: Diagnosis not present

## 2019-10-30 DIAGNOSIS — R110 Nausea: Secondary | ICD-10-CM | POA: Diagnosis not present

## 2019-10-30 LAB — CBC WITH DIFFERENTIAL/PLATELET
Abs Immature Granulocytes: 0.02 10*3/uL (ref 0.00–0.07)
Basophils Absolute: 0 10*3/uL (ref 0.0–0.1)
Basophils Relative: 0 %
Eosinophils Absolute: 0.1 10*3/uL (ref 0.0–0.5)
Eosinophils Relative: 1 %
HCT: 29 % — ABNORMAL LOW (ref 36.0–46.0)
Hemoglobin: 9.2 g/dL — ABNORMAL LOW (ref 12.0–15.0)
Immature Granulocytes: 0 %
Lymphocytes Relative: 11 %
Lymphs Abs: 0.8 10*3/uL (ref 0.7–4.0)
MCH: 33.7 pg (ref 26.0–34.0)
MCHC: 31.7 g/dL (ref 30.0–36.0)
MCV: 106.2 fL — ABNORMAL HIGH (ref 80.0–100.0)
Monocytes Absolute: 0.8 10*3/uL (ref 0.1–1.0)
Monocytes Relative: 11 %
Neutro Abs: 5.6 10*3/uL (ref 1.7–7.7)
Neutrophils Relative %: 77 %
Platelets: 335 10*3/uL (ref 150–400)
RBC: 2.73 MIL/uL — ABNORMAL LOW (ref 3.87–5.11)
RDW: 14.7 % (ref 11.5–15.5)
WBC: 7.4 10*3/uL (ref 4.0–10.5)
nRBC: 0 % (ref 0.0–0.2)

## 2019-10-30 LAB — COMPREHENSIVE METABOLIC PANEL
ALT: 9 U/L (ref 0–44)
AST: 21 U/L (ref 15–41)
Albumin: 3.3 g/dL — ABNORMAL LOW (ref 3.5–5.0)
Alkaline Phosphatase: 95 U/L (ref 38–126)
Anion gap: 9 (ref 5–15)
BUN: 15 mg/dL (ref 6–20)
CO2: 25 mmol/L (ref 22–32)
Calcium: 9.7 mg/dL (ref 8.9–10.3)
Chloride: 104 mmol/L (ref 98–111)
Creatinine, Ser: 0.88 mg/dL (ref 0.44–1.00)
GFR calc Af Amer: >60 mL/min (ref >60)
GFR calc non Af Amer: >60 mL/min (ref >60)
Glucose, Bld: 97 mg/dL (ref 70–99)
Potassium: 3.3 mmol/L — ABNORMAL LOW (ref 3.5–5.1)
Sodium: 138 mmol/L (ref 135–145)
Total Bilirubin: 0.4 mg/dL (ref 0.3–1.2)
Total Protein: 6.4 g/dL — ABNORMAL LOW (ref 6.5–8.1)

## 2019-10-30 MED ORDER — HEPARIN SOD (PORK) LOCK FLUSH 100 UNIT/ML IV SOLN
500.0000 [IU] | Freq: Once | INTRAVENOUS | Status: AC | PRN
  Administered 2019-10-30: 500 [IU]
  Filled 2019-10-30: qty 5

## 2019-10-30 MED ORDER — SODIUM CHLORIDE 0.9 % IV SOLN
Freq: Once | INTRAVENOUS | Status: AC
  Filled 2019-10-30: qty 250

## 2019-10-30 MED ORDER — SODIUM CHLORIDE 0.9 % IV SOLN
200.0000 mg | Freq: Once | INTRAVENOUS | Status: AC
  Administered 2019-10-30: 200 mg via INTRAVENOUS
  Filled 2019-10-30: qty 8

## 2019-10-30 MED ORDER — OXYCODONE HCL ER 15 MG PO T12A: 15.0000 mg | EXTENDED_RELEASE_TABLET | Freq: Three times a day (TID) | ORAL | 0 refills | Status: DC

## 2019-10-30 MED ORDER — SODIUM CHLORIDE 0.9% FLUSH
10.0000 mL | Freq: Once | INTRAVENOUS | Status: AC
  Administered 2019-10-30: 10 mL via INTRAVENOUS
  Filled 2019-10-30: qty 10

## 2019-10-30 NOTE — Progress Notes (Signed)
[place]  [phone] [phone]  ID: Earney Mallet OB: August 29, 1966  MR#: [id number]  QQI#:[id number]  Patient Care Team: Venita Lick, NP as PCP - General (Nurse Practitioner) Clent Jacks, RN as Oncology Nurse Navigator   CHIEF COMPLAINT: Progressive stage IV cervical cancer  INTERVAL HISTORY: Patient returns to clinic today for further evaluation and initiation of cycle 1 of palliative Keytruda for her progressive stage IV cervical cancer.  She continues to have significant pain and anxiety, but otherwise feels well.  She continues to have a poor appetite.  She has no neurologic complaints.  She denies any recent fevers or illnesses.  She has no chest pain, shortness of breath, cough, or hemoptysis.  She denies any nausea, vomiting, constipation, or diarrhea.  She has no urinary complaints.  Patient offers no further specific complaints today.  REVIEW OF SYSTEMS:   Review of Systems  Constitutional: Negative.  Negative for fever, malaise/fatigue and weight loss.  Respiratory: Negative.  Negative for cough and shortness of breath.   Cardiovascular: Negative.  Negative for chest pain and leg swelling.  Gastrointestinal: Positive for abdominal pain.  Genitourinary: Positive for flank pain. Negative for hematuria.  Musculoskeletal: Negative for back pain.  Skin: Negative.  Negative for rash.  Neurological: Negative.  Negative for dizziness, focal weakness, weakness and headaches.  Psychiatric/Behavioral: The patient is nervous/anxious.     As per HPI. Otherwise, a complete review of systems is negative.  PAST MEDICAL HISTORY: Past Medical History:  Diagnosis Date  . Anemia   . Cancer ([place])    cwervical cancer  . Cervical cancer ([place])   . No pertinent past medical history     PAST SURGICAL HISTORY: Past Surgical History:  Procedure Laterality Date  . CYSTOSCOPY W/ URETERAL STENT PLACEMENT Right 10/02/2019   Procedure:  CYSTOSCOPY WITH RETROGRADE PYELOGRAM/URETERAL STENT PLACEMENT;  Surgeon: Hollice Espy, MD;  Location: [facility];  Service: Urology;  Laterality: Right;  . IVC FILTER REMOVAL N/A 08/07/2019   Procedure: IVC FILTER REMOVAL;  Surgeon: Katha Cabal, MD;  Location: [facility];  Service: Cardiovascular;  Laterality: N/A;  . MASS EXCISION     Throat  . PERIPHERAL VASCULAR THROMBECTOMY Left 06/05/2019   Procedure: PERIPHERAL VASCULAR THROMBECTOMY WITH IVC FILTER (LEFT LOWER EXTREMITY);  Surgeon: Katha Cabal, MD;  Location: [facility];  Service: Cardiovascular;  Laterality: Left;  . PORTA CATH INSERTION N/A 06/06/2019   Procedure: PORTA CATH INSERTION;  Surgeon: Katha Cabal, MD;  Location: [facility];  Service: Cardiovascular;  Laterality: N/A;  . TONSILLECTOMY      FAMILY HISTORY: Family History  Problem Relation Age of Onset  . Brain cancer Mother 18  . Alcohol abuse Father   . Heart disease Father   . Prostate cancer Father   . Heart attack Father   . Diabetes Father     ADVANCED DIRECTIVES (Y/N):  N  HEALTH MAINTENANCE: Social History   Tobacco Use  . Smoking status: Former Smoker    Packs/day: 0.50    Years: 30.00    Pack years: 15.00    Types: Cigarettes    Quit date: 07/03/2019    Years since quitting: 0.3  . Smokeless tobacco: Never Used  Substance Use Topics  . Alcohol use: Not Currently  . Drug use: Never     Colonoscopy:  PAP:  Bone density:  Lipid panel:  No Known Allergies  Current Outpatient Medications  Medication Sig Dispense  Refill  . ALPRAZolam (XANAX) 0.25 MG tablet Take 1 tablet (0.25 mg total) by mouth 2 (two) times daily as needed for anxiety. 45 tablet 0  . apixaban (ELIQUIS) 5 MG TABS tablet Take 1 tablet (5 mg total) by mouth 2 (two) times daily. 180 tablet 1  . mirtazapine (REMERON) 7.5 MG tablet Take 1 tablet (7.5 mg total) by mouth at bedtime. 30 tablet 2  . oxybutynin (DITROPAN-XL) 5 MG 24 hr  tablet Take 1 tablet (5 mg total) by mouth at bedtime. TAKE 1 TABLET BY MOUTH EVERYDAY AT BEDTIME 30 tablet 0  . oxyCODONE (OXY IR/ROXICODONE) 5 MG immediate release tablet Take 1-2 tablets (5-10 mg total) by mouth every 4 (four) hours as needed for severe pain. 60 tablet 0  . oxyCODONE (OXYCONTIN) 15 mg 12 hr tablet Take 1 tablet (15 mg total) by mouth every 12 (twelve) hours. 30 tablet 0  . tamsulosin (FLOMAX) 0.4 MG CAPS capsule Take 1 capsule (0.4 mg total) by mouth daily. 30 capsule 1   No current facility-administered medications for this visit.   Facility-Administered Medications Ordered in Other Visits  Medication Dose Route Frequency Provider Last Rate Last Admin  . heparin lock flush 100 unit/mL  500 Units Intracatheter Once PRN Lloyd Huger, MD      . pembrolizumab [facility]) 200 mg in sodium chloride 0.9 % 50 mL chemo infusion  200 mg Intravenous Once Lloyd Huger, MD        OBJECTIVE: Vitals:   10/30/19 1324  BP: 124/77  Pulse: 79  Temp: 99.2 F (37.3 C)     Body mass index is 21.31 kg/m.    ECOG FS:0 - Asymptomatic  General: Thin, no acute distress. Eyes: Pink conjunctiva, anicteric sclera. HEENT: Normocephalic, moist mucous membranes. Lungs: No audible wheezing or coughing. Heart: Regular rate and rhythm. Abdomen: Soft, nontender, no obvious distention. Musculoskeletal: No edema, cyanosis, or clubbing. Neuro: Alert, answering all questions appropriately. Cranial nerves grossly intact. Skin: No rashes or petechiae noted. Psych: Normal affect.  LAB RESULTS:  Lab Results  Component Value Date   NA 138 10/30/2019   K 3.3 (L) 10/30/2019   CL 104 10/30/2019   CO2 25 10/30/2019   GLUCOSE 97 10/30/2019   BUN 15 10/30/2019   CREATININE 0.88 10/30/2019   CALCIUM 9.7 10/30/2019   PROT 6.4 (L) 10/30/2019   ALBUMIN 3.3 (L) 10/30/2019   AST 21 10/30/2019   ALT 9 10/30/2019   ALKPHOS 95 10/30/2019   BILITOT 0.4 10/30/2019   GFRNONAA >60 10/30/2019    GFRAA >60 10/30/2019    Lab Results  Component Value Date   WBC 7.4 10/30/2019   NEUTROABS 5.6 10/30/2019   HGB 9.2 (L) 10/30/2019   HCT 29.0 (L) 10/30/2019   MCV 106.2 (H) 10/30/2019   PLT 335 10/30/2019     STUDIES: CT ABDOMEN PELVIS W CONTRAST  Result Date: 10/29/2019 CLINICAL DATA:  53 year old female with history of cervical cancer. History of right ureteral stent placed on 10/02/2019. Abdominal pain since that time. EXAM: CT ABDOMEN AND PELVIS WITH CONTRAST TECHNIQUE: Multidetector CT imaging of the abdomen and pelvis was performed using the standard protocol following bolus administration of intravenous contrast. CONTRAST:  124m OMNIPAQUE IOHEXOL 300 MG/ML  SOLN COMPARISON:  CT the chest, abdomen and pelvis 05/01/2019. FINDINGS: Lower chest: Mild scarring or atelectasis in the lung bases bilaterally. Hepatobiliary: The posterior aspect of the right lobe of the liver involving portions of both segments 6 and 7 there is a  3.5 x 1.4 cm peripheral hypovascular lesion which is favored to represent a capsular implant. Similarly, there is a smaller capsular lesion overlying segment 6 (axial image 22 of series 2). No intra or extrahepatic biliary ductal dilatation. Small non dependent 5 mm lesion in the gallbladder, favored to represent a gallbladder polyp. Pancreas: No pancreatic mass. No pancreatic ductal dilatation. No pancreatic or peripancreatic fluid collections or inflammatory changes. Spleen: Unremarkable. Adrenals/Urinary Tract: In the upper pole of the right kidney there is a 9 mm low-attenuation lesion, in both kidneys there are subcentimeter low-attenuation lesions, too small to characterize, but favored to represent cysts. Right-sided double-J ureteral stent which appears properly located. Mild right hydroureteronephrosis despite the presence of a stent. No left-sided hydroureteronephrosis. Urinary bladder is normal in appearance. Stomach/Bowel: Normal appearance of the stomach. No  pathologic dilatation of small bowel or colon. The appendix is not confidently identified and may be surgically absent. Regardless, there are no inflammatory changes noted adjacent to the cecum to suggest the presence of an acute appendicitis at this time. Vascular/Lymphatic: Aortic atherosclerosis, without evidence of aneurysm or dissection in the abdominal or pelvic vasculature. Left common iliac vein stent which appears patent. Abnormal soft tissue in the cul-de-sac immediately anterior to the distal sigmoid colon and/or proximal rectum best appreciated on axial image 63 of series 2 and coronal image 85 of series 4 measuring 2.4 x 1.6 x 2.6 cm, and on axial image 62 of series 2 and coronal image 85 of series 4 measuring 1.7 x 1.2 x 2.6 cm, compatible with lymph nodes and/or metastatic intraperitoneal implants. Likewise, some abnormal soft tissue is noted along the right pelvic sidewall adjacent to the distal third of the right ureter measuring approximately 2.8 x 2.1 x 3.3 cm (axial image 55 of series 2 and coronal image 71 of series 4), likely an enlarged lymph node. These all appear increased compared to the prior study. Other lymph nodes appear smaller than the prior examination, including a right obturator lymph node (axial image 63 of series 2) which currently measures only 9 mm in short axis (previously 2.2 cm) additionally, previously noted bulky retroperitoneal lymphadenopathy has largely resolved, now with some amorphous soft tissue in the retroperitoneum encasing the abdominal aorta best appreciated on axial image 27 of series 2. The largest single retroperitoneal lymph node identified on today's examination measures 1 cm in short axis in the left para-aortic nodal station (axial image 25 of series 2). Reproductive: The uterus has further enlarged and is more heterogeneous in appearance than the prior examination, currently measuring approximately 15.0 x 6.4 x 8.0 cm. Diffuse infiltration of the uterus  with multiple hypovascular masses, which also demonstrate extensive involvement of the lower uterine segment and cervical region, particularly anteriorly. There is a tiny locule of gas in the vaginal apex, likely related to recent pelvic examination. Other: Trace volume of ascites, slightly increased compared to the prior examination. No pneumoperitoneum. Musculoskeletal: Tiny sclerotic lesion in the right parasymphyseal region and mixed lucent and sclerotic lesion in the left sacrum and adjacent left ilium all appear similar to the prior examination, and were not hypermetabolic on prior PET-CT, suggesting benign lesions. There are no other aggressive appearing lytic or blastic lesions noted in the visualized portions of the skeleton. IMPRESSION: 1. Today's study demonstrates a mixed response to therapy with further enlargement of the lesions throughout the cervix and uterus, some increasing lymphadenopathy along the right pelvic sidewall adjacent to the distal third of the right ureter, enlarging lymph nodes  and/or metastatic deposits in the cul-de-sac, and new capsular implants associated with the right lobe of the liver with slight increase in trace volume of ascites. Other bulky lymphadenopathy in the pelvis and retroperitoneum has regressed compared to the prior examination. 2. Right-sided double-J ureteral stent appears appropriately located. Despite the presence of the stent there is persistent mild right hydroureteronephrosis. 3. Aortic atherosclerosis. 4. Additional incidental findings, as above. Electronically Signed   By: Vinnie Langton M.D.   On: 10/29/2019 16:27   DG OR UROLOGY CYSTO IMAGE ([place])  Result Date: 10/02/2019 There is no interpretation for this exam.  This order is for images obtained during a surgical procedure.  Please See "Surgeries" Tab for more information regarding the procedure.    ASSESSMENT: Progressive stage IV cervical cancer.  PLAN:    1.  Progressive stage IV  cervical cancer: Biopsy results from April 24, 2019 confirmed the diagnosis. PET scan on September 17, 2019 revealed progressive disease.  Initial plan was to enroll in clinical trial at [facility], but secondary to significant urine protein patient did not qualify.  PD-L1 was found to be positive (3%, CPS is greater than 1).  Proceed with cycle 1 of palliative Keytruda today.  Return to clinic in 3 weeks for further evaluation and consideration of cycle 2. 2.  Hydronephrosis: Imaging as above.  Ureteral stent appears to be appropriately in place. 3.  Venous access: Patient now has had a port placed. 4.  Renal insufficiency: Patient's creatinine continues to be within normal limits.  Appreciate nephrology input. 5.  Anemia: Patient's hemoglobin is trending down 6.  Abdominal/flank pain: Likely secondary to progressive disease.  Continue current narcotics as prescribed.  Appreciate palliative care input.   7.  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  She will require Eliquis for minimum 6 months, but she will likely require extended treatment.  Patient's IVC filter has been removed.  8.  Thrombocytopenia: Resolved. 9.  Anxiety: Patient was given a prescription for Xanax.  Patient expressed understanding and was in agreement with this plan. She also understands that She can call clinic at any time with any questions, concerns, or complaints.   Cancer Staging Cervical cancer ([place]) Staging form: Cervix Uteri, AJCC 8th Edition - Clinical: FIGO Stage IIIB (cT3b, cN1, cM0) - Signed by Lloyd Huger, MD on 05/09/2019   Lloyd Huger, MD   10/30/2019 2:35 PM

## 2019-10-30 NOTE — Progress Notes (Signed)
[place]  Telephone:([phone] [phone]   Name: Mariah Mcmahon Date: 10/30/2019 MRN: [id number]  DOB: 05/12/1966  Patient Care Team: Venita Lick, NP as PCP - General (Nurse Practitioner) Clent Jacks, RN as Oncology Nurse Navigator    REASON FOR CONSULTATION: Palliative Care consult requested for this 53 y.o. 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  Patient is felt not to be a surgical candidate and treatment was initiated with concurrent chemoradiation.  Patient was hospitalized 06/05/2019 -06/07/2019 with acute left lower extremity DVT/pulmonary embolism status post thrombolysis and IVC filter placement.  IVC filter was later removed.  Patient had disease progression on carbotaxol and was referred to [facility] for a clinical trial.  PET on 09/17/2019, which revealed interval progression of hypermetabolic nodal disease in chest, abdomen, and pelvis with new hypermetabolic disease in the region of the right vaginal fornix/cervix. Decision was made to proceed with systemic chemotherapy. CT scan on 12/21 revealed mixed response to treatment. Patient was rotated to [facility]. Patient was referred to palliative care to help address goals and manage ongoing symptoms.  SOCIAL HISTORY:     reports that she quit smoking about 3 months ago. Her smoking use included cigarettes. She has a 15.00 pack-year smoking history. She has never used smokeless tobacco. She reports previous alcohol use. She reports that she does not use drugs.  Patient is not married.  She lives at home alone.  She has a son who is involved in her care, who lives nearby.  Patient previously worked at [employer] in [place] and recently stopped working due to [employer].  ADVANCE DIRECTIVES:  Does not have  CODE STATUS:   PAST MEDICAL  HISTORY: Past Medical History:  Diagnosis Date  . Anemia   . Cancer ([place])    cwervical cancer  . Cervical cancer ([place])   . No pertinent past medical history     PAST SURGICAL HISTORY:  Past Surgical History:  Procedure Laterality Date  . CYSTOSCOPY W/ URETERAL STENT PLACEMENT Right 10/02/2019   Procedure: CYSTOSCOPY WITH RETROGRADE PYELOGRAM/URETERAL STENT PLACEMENT;  Surgeon: Hollice Espy, MD;  Location: [facility] ORS;  Service: Urology;  Laterality: Right;  . IVC FILTER REMOVAL N/A 08/07/2019   Procedure: IVC FILTER REMOVAL;  Surgeon: Katha Cabal, MD;  Location: [place] [facility];  Service: Cardiovascular;  Laterality: N/A;  . MASS EXCISION     Throat  . PERIPHERAL VASCULAR THROMBECTOMY Left 06/05/2019   Procedure: PERIPHERAL VASCULAR THROMBECTOMY WITH IVC FILTER (LEFT LOWER EXTREMITY);  Surgeon: Katha Cabal, MD;  Location: [place] [facility];  Service: Cardiovascular;  Laterality: Left;  . PORTA CATH INSERTION N/A 06/06/2019   Procedure: PORTA CATH INSERTION;  Surgeon: Katha Cabal, MD;  Location: [place] [facility];  Service: Cardiovascular;  Laterality: N/A;  . TONSILLECTOMY      HEMATOLOGY/ONCOLOGY HISTORY:  Oncology History  Cervical cancer ([place])  05/04/2019 Initial Diagnosis   Cervical cancer ([place])   05/09/2019 Cancer Staging   Staging form: Cervix Uteri, AJCC 8th Edition - Clinical: FIGO Stage IIIB (cT3b, cN1, cM0) - Signed by Lloyd Huger, MD on 05/09/2019   05/17/2019 - 07/04/2019 Chemotherapy   The patient had palonosetron (ALOXI) injection 0.25 mg, 0.25 mg, Intravenous,  Once, 6 of 7 cycles Administration: 0.25 mg (05/17/2019), 0.25 mg (05/24/2019), 0.25 mg (05/31/2019), 0.25 mg (06/14/2019), 0.25 mg (06/22/2019),  0.25 mg (06/28/2019) CISplatin (PLATINOL) 61 mg in sodium chloride 0.9 % 250 mL chemo infusion, 40 mg/m2 = 61 mg, Intravenous,  Once, 6 of 7 cycles Administration: 61 mg (05/17/2019), 61 mg (05/24/2019), 61 mg (05/31/2019), 61 mg (06/14/2019), 61  mg (06/22/2019), 61 mg (06/28/2019) fosaprepitant (EMEND) 150 mg, dexamethasone (DECADRON) 12 mg in sodium chloride 0.9 % 145 mL IVPB, , Intravenous,  Once, 6 of 7 cycles Administration:  (05/17/2019),  (05/24/2019),  (05/31/2019),  (06/14/2019),  (06/22/2019),  (06/28/2019)  for chemotherapy treatment.    07/20/2019 - 09/20/2019 Chemotherapy   The patient had palonosetron (ALOXI) injection 0.25 mg, 0.25 mg, Intravenous,  Once, 3 of 6 cycles Administration: 0.25 mg (07/20/2019), 0.25 mg (08/10/2019), 0.25 mg (08/31/2019) pegfilgrastim (NEULASTA ONPRO KIT) injection 6 mg, 6 mg, Subcutaneous, Once, 2 of 5 cycles Administration: 6 mg (07/20/2019), 6 mg (08/31/2019) CARBOplatin (PARAPLATIN) 520 mg in sodium chloride 0.9 % 250 mL chemo infusion, 520 mg (95.1 % of original dose 541.8 mg), Intravenous,  Once, 3 of 6 cycles Dose modification:   (original dose 541.8 mg, Cycle 1) Administration: 520 mg (07/20/2019), 520 mg (08/10/2019), 520 mg (08/31/2019) PACLitaxel (TAXOL) 300 mg in sodium chloride 0.9 % 250 mL chemo infusion (> '80mg'$ /m2), 200 mg/m2 = 300 mg, Intravenous,  Once, 3 of 6 cycles Administration: 300 mg (07/20/2019), 300 mg (08/10/2019), 300 mg (08/31/2019) fosaprepitant (EMEND) 150 mg, dexamethasone (DECADRON) 12 mg in sodium chloride 0.9 % 145 mL IVPB, , Intravenous,  Once, 3 of 6 cycles Administration:  (07/20/2019),  (08/10/2019),  (08/31/2019)  for chemotherapy treatment.    10/30/2019 -  Chemotherapy   The patient had pembrolizumab (KEYTRUDA) 200 mg in sodium chloride 0.9 % 50 mL chemo infusion, 200 mg, Intravenous, Once, 1 of 6 cycles  for chemotherapy treatment.      ALLERGIES:  has No Known Allergies.  MEDICATIONS:  Current Outpatient Medications  Medication Sig Dispense Refill  . ALPRAZolam (XANAX) 0.25 MG tablet Take 1 tablet (0.25 mg total) by mouth 2 (two) times daily as needed for anxiety. 45 tablet 0  . apixaban (ELIQUIS) 5 MG TABS tablet Take 1 tablet (5 mg total) by mouth 2 (two) times  daily. 180 tablet 1  . mirtazapine (REMERON) 7.5 MG tablet Take 1 tablet (7.5 mg total) by mouth at bedtime. 30 tablet 2  . oxybutynin (DITROPAN-XL) 5 MG 24 hr tablet Take 1 tablet (5 mg total) by mouth at bedtime. TAKE 1 TABLET BY MOUTH EVERYDAY AT BEDTIME 30 tablet 0  . oxyCODONE (OXY IR/ROXICODONE) 5 MG immediate release tablet Take 1-2 tablets (5-10 mg total) by mouth every 4 (four) hours as needed for severe pain. 60 tablet 0  . oxyCODONE (OXYCONTIN) 15 mg 12 hr tablet Take 1 tablet (15 mg total) by mouth every 12 (twelve) hours. 30 tablet 0  . tamsulosin (FLOMAX) 0.4 MG CAPS capsule Take 1 capsule (0.4 mg total) by mouth daily. 30 capsule 1   No current facility-administered medications for this visit.   Facility-Administered Medications Ordered in Other Visits  Medication Dose Route Frequency Provider Last Rate Last Admin  . heparin lock flush 100 unit/mL  500 Units Intracatheter Once PRN Lloyd Huger, MD        VITAL SIGNS: There were no vitals taken for this visit. There were no vitals filed for this visit.  Estimated body mass index is 21.31 kg/m as calculated from the following:   Height as of 10/02/19: '5\' 2"'$  (1.575 m).   Weight as of an earlier  encounter on 10/30/19: 116 lb 8 oz (52.8 kg).  LABS: CBC:    Component Value Date/Time   WBC 7.4 10/30/2019 1313   HGB 9.2 (L) 10/30/2019 1313   HGB 14.5 04/09/2019 0918   HCT 29.0 (L) 10/30/2019 1313   HCT 44.2 04/09/2019 0918   PLT 335 10/30/2019 1313   PLT 401 04/09/2019 0918   MCV 106.2 (H) 10/30/2019 1313   MCV 99 (H) 04/09/2019 0918   NEUTROABS 5.6 10/30/2019 1313   NEUTROABS 5.9 04/09/2019 0918   LYMPHSABS 0.8 10/30/2019 1313   LYMPHSABS 1.3 04/09/2019 0918   MONOABS 0.8 10/30/2019 1313   EOSABS 0.1 10/30/2019 1313   EOSABS 0.2 04/09/2019 0918   BASOSABS 0.0 10/30/2019 1313   BASOSABS 0.1 04/09/2019 0918   Comprehensive Metabolic Panel:    Component Value Date/Time   NA 138 10/30/2019 1313   NA 138  04/09/2019 0918   K 3.3 (L) 10/30/2019 1313   CL 104 10/30/2019 1313   CO2 25 10/30/2019 1313   BUN 15 10/30/2019 1313   BUN 12 04/09/2019 0918   CREATININE 0.88 10/30/2019 1313   GLUCOSE 97 10/30/2019 1313   CALCIUM 9.7 10/30/2019 1313   AST 21 10/30/2019 1313   ALT 9 10/30/2019 1313   ALKPHOS 95 10/30/2019 1313   BILITOT 0.4 10/30/2019 1313   BILITOT 0.4 04/09/2019 0918   PROT 6.4 (L) 10/30/2019 1313   PROT 6.8 04/09/2019 0918   ALBUMIN 3.3 (L) 10/30/2019 1313   ALBUMIN 4.0 04/09/2019 0918    RADIOGRAPHIC STUDIES: CT ABDOMEN PELVIS W CONTRAST  Result Date: 10/29/2019 CLINICAL DATA:  53 year old female with history of cervical cancer. History of right ureteral stent placed on 10/02/2019. Abdominal pain since that time. EXAM: CT ABDOMEN AND PELVIS WITH CONTRAST TECHNIQUE: Multidetector CT imaging of the abdomen and pelvis was performed using the standard protocol following bolus administration of intravenous contrast. CONTRAST:  158m OMNIPAQUE IOHEXOL 300 MG/ML  SOLN COMPARISON:  CT the chest, abdomen and pelvis 05/01/2019. FINDINGS: Lower chest: Mild scarring or atelectasis in the lung bases bilaterally. Hepatobiliary: The posterior aspect of the right lobe of the liver involving portions of both segments 6 and 7 there is a 3.5 x 1.4 cm peripheral hypovascular lesion which is favored to represent a capsular implant. Similarly, there is a smaller capsular lesion overlying segment 6 (axial image 22 of series 2). No intra or extrahepatic biliary ductal dilatation. Small non dependent 5 mm lesion in the gallbladder, favored to represent a gallbladder polyp. Pancreas: No pancreatic mass. No pancreatic ductal dilatation. No pancreatic or peripancreatic fluid collections or inflammatory changes. Spleen: Unremarkable. Adrenals/Urinary Tract: In the upper pole of the right kidney there is a 9 mm low-attenuation lesion, in both kidneys there are subcentimeter low-attenuation lesions, too small to  characterize, but favored to represent cysts. Right-sided double-J ureteral stent which appears properly located. Mild right hydroureteronephrosis despite the presence of a stent. No left-sided hydroureteronephrosis. Urinary bladder is normal in appearance. Stomach/Bowel: Normal appearance of the stomach. No pathologic dilatation of small bowel or colon. The appendix is not confidently identified and may be surgically absent. Regardless, there are no inflammatory changes noted adjacent to the cecum to suggest the presence of an acute appendicitis at this time. Vascular/Lymphatic: Aortic atherosclerosis, without evidence of aneurysm or dissection in the abdominal or pelvic vasculature. Left common iliac vein stent which appears patent. Abnormal soft tissue in the cul-de-sac immediately anterior to the distal sigmoid colon and/or proximal rectum best appreciated on axial image  63 of series 2 and coronal image 85 of series 4 measuring 2.4 x 1.6 x 2.6 cm, and on axial image 62 of series 2 and coronal image 85 of series 4 measuring 1.7 x 1.2 x 2.6 cm, compatible with lymph nodes and/or metastatic intraperitoneal implants. Likewise, some abnormal soft tissue is noted along the right pelvic sidewall adjacent to the distal third of the right ureter measuring approximately 2.8 x 2.1 x 3.3 cm (axial image 55 of series 2 and coronal image 71 of series 4), likely an enlarged lymph node. These all appear increased compared to the prior study. Other lymph nodes appear smaller than the prior examination, including a right obturator lymph node (axial image 63 of series 2) which currently measures only 9 mm in short axis (previously 2.2 cm) additionally, previously noted bulky retroperitoneal lymphadenopathy has largely resolved, now with some amorphous soft tissue in the retroperitoneum encasing the abdominal aorta best appreciated on axial image 27 of series 2. The largest single retroperitoneal lymph node identified on today's  examination measures 1 cm in short axis in the left para-aortic nodal station (axial image 25 of series 2). Reproductive: The uterus has further enlarged and is more heterogeneous in appearance than the prior examination, currently measuring approximately 15.0 x 6.4 x 8.0 cm. Diffuse infiltration of the uterus with multiple hypovascular masses, which also demonstrate extensive involvement of the lower uterine segment and cervical region, particularly anteriorly. There is a tiny locule of gas in the vaginal apex, likely related to recent pelvic examination. Other: Trace volume of ascites, slightly increased compared to the prior examination. No pneumoperitoneum. Musculoskeletal: Tiny sclerotic lesion in the right parasymphyseal region and mixed lucent and sclerotic lesion in the left sacrum and adjacent left ilium all appear similar to the prior examination, and were not hypermetabolic on prior PET-CT, suggesting benign lesions. There are no other aggressive appearing lytic or blastic lesions noted in the visualized portions of the skeleton. IMPRESSION: 1. Today's study demonstrates a mixed response to therapy with further enlargement of the lesions throughout the cervix and uterus, some increasing lymphadenopathy along the right pelvic sidewall adjacent to the distal third of the right ureter, enlarging lymph nodes and/or metastatic deposits in the cul-de-sac, and new capsular implants associated with the right lobe of the liver with slight increase in trace volume of ascites. Other bulky lymphadenopathy in the pelvis and retroperitoneum has regressed compared to the prior examination. 2. Right-sided double-J ureteral stent appears appropriately located. Despite the presence of the stent there is persistent mild right hydroureteronephrosis. 3. Aortic atherosclerosis. 4. Additional incidental findings, as above. Electronically Signed   By: Vinnie Langton M.D.   On: 10/29/2019 16:27   DG OR UROLOGY CYSTO IMAGE  ([place])  Result Date: 10/02/2019 There is no interpretation for this exam.  This order is for images obtained during a surgical procedure.  Please See "Surgeries" Tab for more information regarding the procedure.    PERFORMANCE STATUS (ECOG) : 1 - Symptomatic but completely ambulatory  Review of Systems Unless otherwise noted, a complete review of systems is negative.  Physical Exam General: NAD, frail appearing, thin Pulmonary: Unlabored Extremities: no edema Skin: no rashes Neurological: Weakness but otherwise nonfocal  IMPRESSION: Routine follow-up visit.  Patient was seen in the infusion area while she was receiving treatment.  CT scan of the abdomen and pelvis on 12/21 revealed a mixed response to treatment with enlargement of lesions throughout the cervix and uterus and increasing lymphadenopathy along the right  pelvic sidewall.  Patient has bulky lymphadenopathy in the pelvis and retroperitoneum that had regressed some compared to prior study.  Right-sided ureteral stent appeared to be properly located but some residual right-sided hydronephrosis.  Patient can continues to have intermittent and severe right-sided flank pain.  Likely that pain is secondary to disease progression and lymphadenopathy as noted on CT scan.  Patient was seen in [facility] yesterday and case discussed with Sonia Baller, NP.  Increased dose of OxyContin to 50 mg every 8.  May continue oxycodone IR as needed for breakthrough pain.  Patient says she is trying to be more consistent with dosing of her pain meds.  She is keeping a pain journal.  I sent patient home with a MOST Form.  We will complete during next visit if patient is interested.  Case discussed with Dr. Grayland Ormond.  PLAN: -Continue current scope of treatment -OxyContin 15 mg every 8 hours -Continue oxycodone IR 5 to 10 mg every 4 hours as needed for breakthrough pain -Would consider adding gabapentin if needed -Continue alprazolam 0.25 mg twice daily  as needed -ACP documents and most form previously reviewed -RTC in 3 weeks with telephone visit next week   Patient expressed understanding and was in agreement with this plan. She also understands that She can call the clinic at any time with any questions, concerns, or complaints.     Time Total: 15 minutes  Visit consisted of counseling and education dealing with the complex and emotionally intense issues of symptom management and palliative care in the setting of serious and potentially life-threatening illness.Greater than 50%  of this time was spent counseling and coordinating care related to the above assessment and plan.  Signed by: Altha Harm, PhD, NP-C [phone] (Work Cell)

## 2019-10-31 LAB — THYROID PANEL WITH TSH
Free Thyroxine Index: 2 (ref 1.2–4.9)
T3 Uptake Ratio: 27 % (ref 24–39)
T4, Total: 7.3 ug/dL (ref 4.5–12.0)
TSH: 2.38 u[IU]/mL (ref 0.450–4.500)

## 2019-11-01 ENCOUNTER — Other Ambulatory Visit: Payer: Self-pay | Admitting: Oncology

## 2019-11-01 DIAGNOSIS — Z7189 Other specified counseling: Secondary | ICD-10-CM | POA: Insufficient documentation

## 2019-11-03 ENCOUNTER — Other Ambulatory Visit: Payer: Self-pay | Admitting: Hematology and Oncology

## 2019-11-03 ENCOUNTER — Telehealth: Payer: Self-pay | Admitting: Hematology and Oncology

## 2019-11-03 MED ORDER — OXYCODONE HCL ER 15 MG PO T12A: 15.0000 mg | EXTENDED_RELEASE_TABLET | Freq: Three times a day (TID) | ORAL | 0 refills | Status: DC

## 2019-11-03 NOTE — Telephone Encounter (Signed)
Re:  Pain medication  Patient called to say she was running out of her Oxycontin 15 mg po q 8 hours.  She only had 2 pills left. Rx was written on 10/23/2019.  At that time Oxycontin was 15 mg po q 12 hours but was subsequently increased to 15 mg po q 8 hours by Altha Harm, NP.  I called [facility] to confirm that no prescription was received on 10/30/2019 when dose was increased per Altha Harm, NP note. Despite log in computer, no Rx received by [facility].  I sent in an electronic Rx for Oxycontin 15 mg po q 8 hours (dis: #5) to get her through the holiday weekend. A new Rx will need to be sent on 11/05/2019.   Lequita Asal, MD

## 2019-11-04 ENCOUNTER — Encounter: Payer: Self-pay | Admitting: Oncology

## 2019-11-05 ENCOUNTER — Other Ambulatory Visit: Payer: Self-pay | Admitting: Hospice and Palliative Medicine

## 2019-11-05 ENCOUNTER — Other Ambulatory Visit: Payer: Self-pay | Admitting: Emergency Medicine

## 2019-11-05 ENCOUNTER — Encounter: Payer: Self-pay | Admitting: Oncology

## 2019-11-05 DIAGNOSIS — C539 Malignant neoplasm of cervix uteri, unspecified: Secondary | ICD-10-CM

## 2019-11-05 MED ORDER — OXYCODONE HCL ER 15 MG PO T12A: 15.0000 mg | EXTENDED_RELEASE_TABLET | Freq: Three times a day (TID) | ORAL | 0 refills | Status: DC

## 2019-11-05 MED ORDER — OXYCODONE HCL 5 MG PO TABS: 5.0000 mg | ORAL_TABLET | ORAL | 0 refills | Status: DC | PRN

## 2019-11-06 ENCOUNTER — Inpatient Hospital Stay (HOSPITAL_BASED_OUTPATIENT_CLINIC_OR_DEPARTMENT_OTHER): Payer: BC Managed Care – PPO | Admitting: Hospice and Palliative Medicine

## 2019-11-06 DIAGNOSIS — C539 Malignant neoplasm of cervix uteri, unspecified: Secondary | ICD-10-CM | POA: Diagnosis not present

## 2019-11-06 DIAGNOSIS — G893 Neoplasm related pain (acute) (chronic): Secondary | ICD-10-CM

## 2019-11-06 DIAGNOSIS — Z515 Encounter for palliative care: Secondary | ICD-10-CM | POA: Diagnosis not present

## 2019-11-06 NOTE — Progress Notes (Signed)
Virtual Visit via Telephone Note  I connected with Mariah Mcmahon on 11/06/19 at  2:30 PM EST by telephone and verified that I am speaking with the correct person using two identifiers.   I discussed the limitations, risks, security and privacy concerns of performing an evaluation and management service by telephone and the availability of in person appointments. I also discussed with the patient that there may be a patient responsible charge related to this service. The patient expressed understanding and agreed to proceed.   History of Present Illness: Palliative Care consult requested for this 53 y.o. 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  Patient was felt not to be a surgical candidate and was treated with concurrent chemoradiation.  Patient was hospitalized 06/05/2019 -06/07/2019 with acute left lower extremity DVT/pulmonary embolism status post thrombolysis and IVC filter placement.  IVC filter was later removed.  Patient had disease progression on carbotaxol and was referred to [facility] for a clinical trial.  PET on 09/17/2019, which revealed interval progression of hypermetabolic nodal disease in chest, abdomen, and pelvis with new hypermetabolic disease in the region of the right vaginal fornix/cervix. Decision was made to proceed with systemic chemotherapy. CT scan on 12/21 revealed mixed response to treatment. Patient was rotated to [facility]. Patient was referred to palliative care to help address goals and manage ongoing symptoms.   Observations/Objective: Spoke with patient by phone.  She reports that she is doing reasonably well.  She denies any acute changes or concerns.  She says that her pain is slightly improved from where it was last week.  She continues to take OxyContin and oxycodone IR for breakthrough pain.  She has had some constipation and we discussed adjustment to her bowel  regimen.  No other significant changes reported.  Patient has had some difficulty obtaining pain medications from the pharmacy.  I called and spoke with the pharmacist today.  Assessment and Plan: Stage IV cervical cancer -on current treatment with Keytruda.  Has scheduled follow-up with Dr. Grayland Ormond and next treatment dose on 1/12  Neoplasm related pain -slightly improved with increased OxyContin.  Continue OxyContin 50 mg every 8 hours with oxycodone IR 5 to 10 mg every 4 hours as needed for breakthrough pain  Opioid-induced constipation -increase Senokot to 1 to 2 tablets twice daily  Follow Up Instructions: Follow-up visit on 1/12   I discussed the assessment and treatment plan with the patient. The patient was provided an opportunity to ask questions and all were answered. The patient agreed with the plan and demonstrated an understanding of the instructions.   The patient was advised to call back or seek an in-person evaluation if the symptoms worsen or if the condition fails to improve as anticipated.  I provided 15 minutes of non-face-to-face time during this encounter.   Irean Hong, NP

## 2019-11-07 ENCOUNTER — Encounter: Payer: BC Managed Care – PPO | Admitting: Hospice and Palliative Medicine

## 2019-11-12 ENCOUNTER — Other Ambulatory Visit: Payer: Self-pay | Admitting: *Deleted

## 2019-11-12 MED ORDER — OXYBUTYNIN CHLORIDE ER 5 MG PO TB24: 5.0000 mg | ORAL_TABLET | Freq: Every day | ORAL | 0 refills | Status: DC

## 2019-11-14 ENCOUNTER — Other Ambulatory Visit: Payer: Self-pay | Admitting: Oncology

## 2019-11-14 ENCOUNTER — Other Ambulatory Visit: Payer: Self-pay | Admitting: Emergency Medicine

## 2019-11-14 DIAGNOSIS — N1330 Unspecified hydronephrosis: Secondary | ICD-10-CM

## 2019-11-14 DIAGNOSIS — C539 Malignant neoplasm of cervix uteri, unspecified: Secondary | ICD-10-CM

## 2019-11-14 MED ORDER — OXYCODONE HCL 5 MG PO TABS: 5.0000 mg | ORAL_TABLET | ORAL | 0 refills | Status: DC | PRN

## 2019-11-15 ENCOUNTER — Ambulatory Visit: Payer: BC Managed Care – PPO | Attending: Radiation Oncology | Admitting: Radiation Oncology

## 2019-11-16 ENCOUNTER — Other Ambulatory Visit: Payer: Self-pay | Admitting: Oncology

## 2019-11-16 DIAGNOSIS — C539 Malignant neoplasm of cervix uteri, unspecified: Secondary | ICD-10-CM

## 2019-11-16 MED ORDER — OXYBUTYNIN CHLORIDE ER 5 MG PO TB24: 5.0000 mg | ORAL_TABLET | Freq: Every day | ORAL | 0 refills | Status: DC

## 2019-11-16 MED ORDER — OXYCODONE HCL 5 MG PO TABS: 5.0000 mg | ORAL_TABLET | ORAL | 0 refills | Status: DC | PRN

## 2019-11-16 NOTE — Telephone Encounter (Signed)
Dr. Finnegan pt ° °

## 2019-11-17 NOTE — Progress Notes (Signed)
[place]  [phone] [phone]  ID: Mariah Mcmahon OB: 10-08-1966  MR#: [id number]  EXN#:[id number]  Patient Care Team: Venita Lick, NP as PCP - General (Nurse Practitioner) Clent Jacks, RN as Oncology Nurse Navigator   CHIEF COMPLAINT: Progressive stage IV cervical cancer  INTERVAL HISTORY: Patient returns to clinic today for further evaluation and consideration of cycle 2 of palliative Keytruda.  She has noticed significant increase in vaginal bleeding over the past several days.  She also continues to have pain and anxiety.  She has insomnia and a poor appetite.  She has no neurologic complaints.  She denies any recent fevers or illnesses.  She has no chest pain, shortness of breath, cough, or hemoptysis.  She denies any nausea, vomiting, constipation, or diarrhea.  She has no urinary complaints.  Patient offers no further specific complaints today.  REVIEW OF SYSTEMS:   Review of Systems  Constitutional: Positive for malaise/fatigue and weight loss. Negative for fever.  Respiratory: Negative.  Negative for cough and shortness of breath.   Cardiovascular: Negative.  Negative for chest pain and leg swelling.  Gastrointestinal: Positive for abdominal pain.  Genitourinary: Positive for flank pain. Negative for hematuria.       Vaginal bleeding.  Musculoskeletal: Negative for back pain.  Skin: Negative.  Negative for rash.  Neurological: Positive for weakness. Negative for dizziness, focal weakness and headaches.  Psychiatric/Behavioral: The patient is nervous/anxious and has insomnia.     As per HPI. Otherwise, a complete review of systems is negative.  PAST MEDICAL HISTORY: Past Medical History:  Diagnosis Date  . Anemia   . Cancer ([place])    cwervical cancer  . Cervical cancer ([place])   . No pertinent past medical history     PAST SURGICAL HISTORY: Past Surgical History:  Procedure Laterality Date  . CYSTOSCOPY W/  URETERAL STENT PLACEMENT Right 10/02/2019   Procedure: CYSTOSCOPY WITH RETROGRADE PYELOGRAM/URETERAL STENT PLACEMENT;  Surgeon: Hollice Espy, MD;  Location: [facility];  Service: Urology;  Laterality: Right;  . IVC FILTER REMOVAL N/A 08/07/2019   Procedure: IVC FILTER REMOVAL;  Surgeon: Katha Cabal, MD;  Location: [place] [facility];  Service: Cardiovascular;  Laterality: N/A;  . MASS EXCISION     Throat  . PERIPHERAL VASCULAR THROMBECTOMY Left 06/05/2019   Procedure: PERIPHERAL VASCULAR THROMBECTOMY WITH IVC FILTER (LEFT LOWER EXTREMITY);  Surgeon: Katha Cabal, MD;  Location: [place] [facility];  Service: Cardiovascular;  Laterality: Left;  . PORTA CATH INSERTION N/A 06/06/2019   Procedure: PORTA CATH INSERTION;  Surgeon: Katha Cabal, MD;  Location: [place] [facility];  Service: Cardiovascular;  Laterality: N/A;  . TONSILLECTOMY      FAMILY HISTORY: Family History  Problem Relation Age of Onset  . Brain cancer Mother 82  . Alcohol abuse Father   . Heart disease Father   . Prostate cancer Father   . Heart attack Father   . Diabetes Father     ADVANCED DIRECTIVES (Y/N):  N  HEALTH MAINTENANCE: Social History   Tobacco Use  . Smoking status: Former Smoker    Packs/day: 0.50    Years: 30.00    Pack years: 15.00    Types: Cigarettes    Quit date: 07/03/2019    Years since quitting: 0.3  . Smokeless tobacco: Never Used  Substance Use Topics  . Alcohol use: Not Currently  . Drug use: Never     Colonoscopy:  PAP:  Bone density:  Lipid panel:  No Known Allergies  Current Outpatient Medications  Medication Sig Dispense Refill  . ALPRAZolam (XANAX) 0.25 MG tablet Take 1 tablet (0.25 mg total) by mouth 2 (two) times daily as needed for anxiety. 45 tablet 0  . apixaban (ELIQUIS) 5 MG TABS tablet Take 1 tablet (5 mg total) by mouth 2 (two) times daily. 180 tablet 1  . mirtazapine (REMERON) 7.5 MG tablet Take 1 tablet (7.5 mg total) by mouth at bedtime.  30 tablet 2  . oxybutynin (DITROPAN-XL) 5 MG 24 hr tablet Take 1 tablet (5 mg total) by mouth at bedtime. TAKE 1 TABLET BY MOUTH EVERYDAY AT BEDTIME 30 tablet 0  . oxyCODONE (OXY IR/ROXICODONE) 5 MG immediate release tablet Take 1-2 tablets (5-10 mg total) by mouth every 4 (four) hours as needed for severe pain. 60 tablet 0  . tamsulosin (FLOMAX) 0.4 MG CAPS capsule TAKE 1 CAPSULE BY MOUTH EVERY DAY 30 capsule 1  . dexamethasone (DECADRON) 4 MG tablet Take 1 tablet (4 mg total) by mouth daily. 14 tablet 0  . ondansetron (ZOFRAN) 8 MG tablet Take 1 tablet (8 mg total) by mouth every 8 (eight) hours as needed for nausea or vomiting. 45 tablet 0  . oxyCODONE ER (XTAMPZA ER) 13.5 MG C12A Take 1 capsule by mouth every 8 (eight) hours. 90 capsule 0   No current facility-administered medications for this visit.    OBJECTIVE: Vitals:   11/20/19 1111  BP: 104/82  Pulse: (!) 121  Resp: 18  Temp: 98.2 F (36.8 C)     Body mass index is 19.57 kg/m.    ECOG FS:0 - Asymptomatic  General: Thin, no acute distress. Eyes: Pink conjunctiva, anicteric sclera. HEENT: Normocephalic, moist mucous membranes. Lungs: No audible wheezing or coughing. Heart: Regular rate and rhythm. Abdomen: Soft, nontender, no obvious distention. Musculoskeletal: No edema, cyanosis, or clubbing. Neuro: Alert, answering all questions appropriately. Cranial nerves grossly intact. Skin: No rashes or petechiae noted. Psych: Normal affect.   LAB RESULTS:  Lab Results  Component Value Date   NA 136 11/20/2019   K 3.5 11/20/2019   CL 101 11/20/2019   CO2 25 11/20/2019   GLUCOSE 130 (H) 11/20/2019   BUN 13 11/20/2019   CREATININE 0.84 11/20/2019   CALCIUM 9.8 11/20/2019   PROT 7.2 11/20/2019   ALBUMIN 3.4 (L) 11/20/2019   AST 26 11/20/2019   ALT 11 11/20/2019   ALKPHOS 123 11/20/2019   BILITOT 0.6 11/20/2019   GFRNONAA >60 11/20/2019   GFRAA >60 11/20/2019    Lab Results  Component Value Date   WBC 5.9  11/20/2019   NEUTROABS 4.2 11/20/2019   HGB 10.3 (L) 11/20/2019   HCT 33.9 (L) 11/20/2019   MCV 100.3 (H) 11/20/2019   PLT 358 11/20/2019     STUDIES: CT ABDOMEN PELVIS W CONTRAST  Result Date: 10/29/2019 CLINICAL DATA:  54 year old female with history of cervical cancer. History of right ureteral stent placed on 10/02/2019. Abdominal pain since that time. EXAM: CT ABDOMEN AND PELVIS WITH CONTRAST TECHNIQUE: Multidetector CT imaging of the abdomen and pelvis was performed using the standard protocol following bolus administration of intravenous contrast. CONTRAST:  173m OMNIPAQUE IOHEXOL 300 MG/ML  SOLN COMPARISON:  CT the chest, abdomen and pelvis 05/01/2019. FINDINGS: Lower chest: Mild scarring or atelectasis in the lung bases bilaterally. Hepatobiliary: The posterior aspect of the right lobe of the liver involving portions of both segments 6 and 7 there is a 3.5 x 1.4 cm peripheral hypovascular lesion  which is favored to represent a capsular implant. Similarly, there is a smaller capsular lesion overlying segment 6 (axial image 22 of series 2). No intra or extrahepatic biliary ductal dilatation. Small non dependent 5 mm lesion in the gallbladder, favored to represent a gallbladder polyp. Pancreas: No pancreatic mass. No pancreatic ductal dilatation. No pancreatic or peripancreatic fluid collections or inflammatory changes. Spleen: Unremarkable. Adrenals/Urinary Tract: In the upper pole of the right kidney there is a 9 mm low-attenuation lesion, in both kidneys there are subcentimeter low-attenuation lesions, too small to characterize, but favored to represent cysts. Right-sided double-J ureteral stent which appears properly located. Mild right hydroureteronephrosis despite the presence of a stent. No left-sided hydroureteronephrosis. Urinary bladder is normal in appearance. Stomach/Bowel: Normal appearance of the stomach. No pathologic dilatation of small bowel or colon. The appendix is not  confidently identified and may be surgically absent. Regardless, there are no inflammatory changes noted adjacent to the cecum to suggest the presence of an acute appendicitis at this time. Vascular/Lymphatic: Aortic atherosclerosis, without evidence of aneurysm or dissection in the abdominal or pelvic vasculature. Left common iliac vein stent which appears patent. Abnormal soft tissue in the cul-de-sac immediately anterior to the distal sigmoid colon and/or proximal rectum best appreciated on axial image 63 of series 2 and coronal image 85 of series 4 measuring 2.4 x 1.6 x 2.6 cm, and on axial image 62 of series 2 and coronal image 85 of series 4 measuring 1.7 x 1.2 x 2.6 cm, compatible with lymph nodes and/or metastatic intraperitoneal implants. Likewise, some abnormal soft tissue is noted along the right pelvic sidewall adjacent to the distal third of the right ureter measuring approximately 2.8 x 2.1 x 3.3 cm (axial image 55 of series 2 and coronal image 71 of series 4), likely an enlarged lymph node. These all appear increased compared to the prior study. Other lymph nodes appear smaller than the prior examination, including a right obturator lymph node (axial image 63 of series 2) which currently measures only 9 mm in short axis (previously 2.2 cm) additionally, previously noted bulky retroperitoneal lymphadenopathy has largely resolved, now with some amorphous soft tissue in the retroperitoneum encasing the abdominal aorta best appreciated on axial image 27 of series 2. The largest single retroperitoneal lymph node identified on today's examination measures 1 cm in short axis in the left para-aortic nodal station (axial image 25 of series 2). Reproductive: The uterus has further enlarged and is more heterogeneous in appearance than the prior examination, currently measuring approximately 15.0 x 6.4 x 8.0 cm. Diffuse infiltration of the uterus with multiple hypovascular masses, which also demonstrate extensive  involvement of the lower uterine segment and cervical region, particularly anteriorly. There is a tiny locule of gas in the vaginal apex, likely related to recent pelvic examination. Other: Trace volume of ascites, slightly increased compared to the prior examination. No pneumoperitoneum. Musculoskeletal: Tiny sclerotic lesion in the right parasymphyseal region and mixed lucent and sclerotic lesion in the left sacrum and adjacent left ilium all appear similar to the prior examination, and were not hypermetabolic on prior PET-CT, suggesting benign lesions. There are no other aggressive appearing lytic or blastic lesions noted in the visualized portions of the skeleton. IMPRESSION: 1. Today's study demonstrates a mixed response to therapy with further enlargement of the lesions throughout the cervix and uterus, some increasing lymphadenopathy along the right pelvic sidewall adjacent to the distal third of the right ureter, enlarging lymph nodes and/or metastatic deposits in the cul-de-sac, and  new capsular implants associated with the right lobe of the liver with slight increase in trace volume of ascites. Other bulky lymphadenopathy in the pelvis and retroperitoneum has regressed compared to the prior examination. 2. Right-sided double-J ureteral stent appears appropriately located. Despite the presence of the stent there is persistent mild right hydroureteronephrosis. 3. Aortic atherosclerosis. 4. Additional incidental findings, as above. Electronically Signed   By: Vinnie Langton M.D.   On: 10/29/2019 16:27    ASSESSMENT: Progressive stage IV cervical cancer.  PLAN:    1.  Progressive stage IV cervical cancer: Biopsy results from April 24, 2019 confirmed the diagnosis. PET scan on September 17, 2019 revealed progressive disease.  Initial plan was to enroll in clinical trial at [facility], but secondary to significant urine protein patient did not qualify.  PD-L1 was found to be positive (3%, CPS is  greater than 1).  Proceed with cycle 2 of palliative Keytruda today.  Return to clinic in 1 week for laboratory work and further evaluation and in 3 weeks for further evaluation and consideration of cycle 3.   2.  Hydronephrosis: Imaging as above.  Ureteral stent appears to be appropriately in place. 3.  Venous access: Patient now has had a port placed. 4.  Renal insufficiency: Resolved.   5.  Anemia: Despite heavy vaginal bleeding, patient's hemoglobin has trended up slightly to 10.3. 6.  Abdominal/flank pain: Likely secondary to progressive disease.  Continue current narcotics as prescribed.  Appreciate palliative care input.   7.  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  She will require Eliquis for minimum 6 months, but she will likely require extended treatment.  Patient's IVC filter has been removed.  8.  Thrombocytopenia: Resolved. 9.  Anxiety: Patient was given a prescription for Xanax. 10.  Poor appetite/insomnia: Patient was instructed to take Remeron as prescribed.   11.  Vaginal bleeding: Difficult situation given patient is on Eliquis for her pulmonary embolism.  We will have gynecology oncology evaluate tomorrow.  Patient expressed understanding and was in agreement with this plan. She also understands that She can call clinic at any time with any questions, concerns, or complaints.   Cancer Staging Cervical cancer [facility]) Staging form: Cervix Uteri, AJCC 8th Edition - Clinical stage from 11/17/2019: FIGO Stage IVB (cT3b, cN1, pM1) - Signed by Lloyd Huger, MD on 11/17/2019   Lloyd Huger, MD   11/20/2019 3:20 PM

## 2019-11-19 ENCOUNTER — Other Ambulatory Visit: Payer: Self-pay

## 2019-11-19 NOTE — Progress Notes (Signed)
Patient pre screened for office appointment, no questions or concerns today. Patient reminded of upcoming appointment time and date. 

## 2019-11-20 ENCOUNTER — Inpatient Hospital Stay: Payer: BC Managed Care – PPO | Attending: Oncology | Admitting: *Deleted

## 2019-11-20 ENCOUNTER — Telehealth: Payer: Self-pay | Admitting: *Deleted

## 2019-11-20 ENCOUNTER — Other Ambulatory Visit: Payer: Self-pay

## 2019-11-20 ENCOUNTER — Inpatient Hospital Stay: Payer: BC Managed Care – PPO | Admitting: Oncology

## 2019-11-20 ENCOUNTER — Encounter: Payer: Self-pay | Admitting: Oncology

## 2019-11-20 ENCOUNTER — Inpatient Hospital Stay: Payer: BC Managed Care – PPO

## 2019-11-20 ENCOUNTER — Inpatient Hospital Stay (HOSPITAL_BASED_OUTPATIENT_CLINIC_OR_DEPARTMENT_OTHER): Payer: BC Managed Care – PPO | Admitting: Hospice and Palliative Medicine

## 2019-11-20 VITALS — HR 79

## 2019-11-20 VITALS — BP 104/82 | HR 121 | Temp 98.2°F | Resp 18 | Wt 107.0 lb

## 2019-11-20 DIAGNOSIS — Z7952 Long term (current) use of systemic steroids: Secondary | ICD-10-CM | POA: Diagnosis not present

## 2019-11-20 DIAGNOSIS — Z95828 Presence of other vascular implants and grafts: Secondary | ICD-10-CM

## 2019-11-20 DIAGNOSIS — G893 Neoplasm related pain (acute) (chronic): Secondary | ICD-10-CM | POA: Insufficient documentation

## 2019-11-20 DIAGNOSIS — R630 Anorexia: Secondary | ICD-10-CM | POA: Insufficient documentation

## 2019-11-20 DIAGNOSIS — C538 Malignant neoplasm of overlapping sites of cervix uteri: Secondary | ICD-10-CM | POA: Diagnosis not present

## 2019-11-20 DIAGNOSIS — C530 Malignant neoplasm of endocervix: Secondary | ICD-10-CM | POA: Diagnosis not present

## 2019-11-20 DIAGNOSIS — I959 Hypotension, unspecified: Secondary | ICD-10-CM | POA: Diagnosis not present

## 2019-11-20 DIAGNOSIS — G4700 Insomnia, unspecified: Secondary | ICD-10-CM | POA: Insufficient documentation

## 2019-11-20 DIAGNOSIS — N1330 Unspecified hydronephrosis: Secondary | ICD-10-CM | POA: Insufficient documentation

## 2019-11-20 DIAGNOSIS — Z86718 Personal history of other venous thrombosis and embolism: Secondary | ICD-10-CM | POA: Insufficient documentation

## 2019-11-20 DIAGNOSIS — Z79891 Long term (current) use of opiate analgesic: Secondary | ICD-10-CM | POA: Diagnosis not present

## 2019-11-20 DIAGNOSIS — F419 Anxiety disorder, unspecified: Secondary | ICD-10-CM | POA: Diagnosis not present

## 2019-11-20 DIAGNOSIS — Z79899 Other long term (current) drug therapy: Secondary | ICD-10-CM | POA: Insufficient documentation

## 2019-11-20 DIAGNOSIS — K5900 Constipation, unspecified: Secondary | ICD-10-CM | POA: Insufficient documentation

## 2019-11-20 DIAGNOSIS — Z515 Encounter for palliative care: Secondary | ICD-10-CM | POA: Insufficient documentation

## 2019-11-20 DIAGNOSIS — Z86711 Personal history of pulmonary embolism: Secondary | ICD-10-CM | POA: Insufficient documentation

## 2019-11-20 DIAGNOSIS — C539 Malignant neoplasm of cervix uteri, unspecified: Secondary | ICD-10-CM

## 2019-11-20 DIAGNOSIS — R809 Proteinuria, unspecified: Secondary | ICD-10-CM | POA: Insufficient documentation

## 2019-11-20 DIAGNOSIS — C778 Secondary and unspecified malignant neoplasm of lymph nodes of multiple regions: Secondary | ICD-10-CM | POA: Diagnosis not present

## 2019-11-20 DIAGNOSIS — R000 Tachycardia, unspecified: Secondary | ICD-10-CM | POA: Insufficient documentation

## 2019-11-20 DIAGNOSIS — E860 Dehydration: Secondary | ICD-10-CM | POA: Diagnosis not present

## 2019-11-20 DIAGNOSIS — N938 Other specified abnormal uterine and vaginal bleeding: Secondary | ICD-10-CM | POA: Diagnosis not present

## 2019-11-20 DIAGNOSIS — C7911 Secondary malignant neoplasm of bladder: Secondary | ICD-10-CM | POA: Diagnosis not present

## 2019-11-20 DIAGNOSIS — R140 Abdominal distension (gaseous): Secondary | ICD-10-CM | POA: Insufficient documentation

## 2019-11-20 DIAGNOSIS — R109 Unspecified abdominal pain: Secondary | ICD-10-CM | POA: Insufficient documentation

## 2019-11-20 DIAGNOSIS — Z87891 Personal history of nicotine dependence: Secondary | ICD-10-CM | POA: Insufficient documentation

## 2019-11-20 DIAGNOSIS — Z923 Personal history of irradiation: Secondary | ICD-10-CM | POA: Insufficient documentation

## 2019-11-20 DIAGNOSIS — R188 Other ascites: Secondary | ICD-10-CM | POA: Insufficient documentation

## 2019-11-20 DIAGNOSIS — C786 Secondary malignant neoplasm of retroperitoneum and peritoneum: Secondary | ICD-10-CM | POA: Insufficient documentation

## 2019-11-20 DIAGNOSIS — D500 Iron deficiency anemia secondary to blood loss (chronic): Secondary | ICD-10-CM | POA: Insufficient documentation

## 2019-11-20 DIAGNOSIS — R634 Abnormal weight loss: Secondary | ICD-10-CM | POA: Insufficient documentation

## 2019-11-20 DIAGNOSIS — Z7901 Long term (current) use of anticoagulants: Secondary | ICD-10-CM | POA: Insufficient documentation

## 2019-11-20 DIAGNOSIS — Z5112 Encounter for antineoplastic immunotherapy: Secondary | ICD-10-CM | POA: Diagnosis not present

## 2019-11-20 LAB — CBC WITH DIFFERENTIAL/PLATELET
Abs Immature Granulocytes: 0.02 10*3/uL (ref 0.00–0.07)
Basophils Absolute: 0.1 10*3/uL (ref 0.0–0.1)
Basophils Relative: 1 %
Eosinophils Absolute: 0.1 10*3/uL (ref 0.0–0.5)
Eosinophils Relative: 2 %
HCT: 33.9 % — ABNORMAL LOW (ref 36.0–46.0)
Hemoglobin: 10.3 g/dL — ABNORMAL LOW (ref 12.0–15.0)
Immature Granulocytes: 0 %
Lymphocytes Relative: 13 %
Lymphs Abs: 0.8 10*3/uL (ref 0.7–4.0)
MCH: 30.5 pg (ref 26.0–34.0)
MCHC: 30.4 g/dL (ref 30.0–36.0)
MCV: 100.3 fL — ABNORMAL HIGH (ref 80.0–100.0)
Monocytes Absolute: 0.7 10*3/uL (ref 0.1–1.0)
Monocytes Relative: 12 %
Neutro Abs: 4.2 10*3/uL (ref 1.7–7.7)
Neutrophils Relative %: 72 %
Platelets: 358 10*3/uL (ref 150–400)
RBC: 3.38 MIL/uL — ABNORMAL LOW (ref 3.87–5.11)
RDW: 17 % — ABNORMAL HIGH (ref 11.5–15.5)
WBC: 5.9 10*3/uL (ref 4.0–10.5)
nRBC: 0 % (ref 0.0–0.2)

## 2019-11-20 LAB — COMPREHENSIVE METABOLIC PANEL
ALT: 11 U/L (ref 0–44)
AST: 26 U/L (ref 15–41)
Albumin: 3.4 g/dL — ABNORMAL LOW (ref 3.5–5.0)
Alkaline Phosphatase: 123 U/L (ref 38–126)
Anion gap: 10 (ref 5–15)
BUN: 13 mg/dL (ref 6–20)
CO2: 25 mmol/L (ref 22–32)
Calcium: 9.8 mg/dL (ref 8.9–10.3)
Chloride: 101 mmol/L (ref 98–111)
Creatinine, Ser: 0.84 mg/dL (ref 0.44–1.00)
GFR calc Af Amer: >60 mL/min (ref >60)
GFR calc non Af Amer: >60 mL/min (ref >60)
Glucose, Bld: 130 mg/dL — ABNORMAL HIGH (ref 70–99)
Potassium: 3.5 mmol/L (ref 3.5–5.1)
Sodium: 136 mmol/L (ref 135–145)
Total Bilirubin: 0.6 mg/dL (ref 0.3–1.2)
Total Protein: 7.2 g/dL (ref 6.5–8.1)

## 2019-11-20 MED ORDER — SODIUM CHLORIDE 0.9% FLUSH
10.0000 mL | Freq: Once | INTRAVENOUS | Status: AC
  Administered 2019-11-20: 10 mL via INTRAVENOUS
  Filled 2019-11-20: qty 10

## 2019-11-20 MED ORDER — HEPARIN SOD (PORK) LOCK FLUSH 100 UNIT/ML IV SOLN
500.0000 [IU] | Freq: Once | INTRAVENOUS | Status: AC | PRN
  Administered 2019-11-20: 500 [IU]
  Filled 2019-11-20: qty 5

## 2019-11-20 MED ORDER — HEPARIN SOD (PORK) LOCK FLUSH 100 UNIT/ML IV SOLN
INTRAVENOUS | Status: AC
  Filled 2019-11-20: qty 5

## 2019-11-20 MED ORDER — SODIUM CHLORIDE 0.9 % IV SOLN
Freq: Once | INTRAVENOUS | Status: AC
  Filled 2019-11-20: qty 250

## 2019-11-20 MED ORDER — ONDANSETRON HCL 8 MG PO TABS: 8.0000 mg | ORAL_TABLET | Freq: Three times a day (TID) | ORAL | 0 refills | Status: AC | PRN

## 2019-11-20 MED ORDER — OXYCODONE ER 27 MG PO C12A: 1.0000 | EXTENDED_RELEASE_CAPSULE | Freq: Two times a day (BID) | ORAL | 0 refills | Status: DC

## 2019-11-20 MED ORDER — SODIUM CHLORIDE 0.9 % IV SOLN
200.0000 mg | Freq: Once | INTRAVENOUS | Status: AC
  Administered 2019-11-20: 13:00:00 200 mg via INTRAVENOUS
  Filled 2019-11-20: qty 8

## 2019-11-20 MED ORDER — DEXAMETHASONE 4 MG PO TABS: 4.0000 mg | ORAL_TABLET | Freq: Every day | ORAL | 0 refills | Status: AC

## 2019-11-20 MED ORDER — XTAMPZA ER 13.5 MG PO C12A: 1.0000 | EXTENDED_RELEASE_CAPSULE | Freq: Three times a day (TID) | ORAL | 0 refills | Status: DC

## 2019-11-20 NOTE — Telephone Encounter (Signed)
Call from pharmacy stating Xtampza cannot be filled as the Otho Darner is over the limit and probably needs a prior authorization

## 2019-11-20 NOTE — Progress Notes (Signed)
[place]  [phone] [phone]   Name: Mariah Mcmahon Date: 11/20/2019 MRN: [id number]  DOB: [birth year]  Patient Care Team: Venita Lick, NP as PCP - General (Nurse Practitioner) Clent Jacks, RN as Oncology Nurse Navigator    REASON FOR CONSULTATION: Mariah Mcmahon is a 54 y.o. 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  Patient is felt not to be a surgical candidate and treatment was initiated with concurrent chemoradiation.  Patient was hospitalized 06/05/2019 -06/07/2019 with acute left lower extremity DVT/pulmonary embolism status post thrombolysis and IVC filter placement.  IVC filter was later removed.  Patient had disease progression on carbotaxol and was referred to [facility] for a clinical trial.  PET on 09/17/2019, which revealed interval progression of hypermetabolic nodal disease in chest, abdomen, and pelvis with new hypermetabolic disease in the region of the right vaginal fornix/cervix. Decision was made to proceed with systemic chemotherapy. CT scan on 12/21 revealed mixed response to treatment. Patient was rotated to [facility]. Patient was referred to palliative care to help address goals and manage ongoing symptoms.  SOCIAL HISTORY:     reports that she quit smoking about 4 months ago. Her smoking use included cigarettes. She has a 15.00 pack-year smoking history. She has never used smokeless tobacco. She reports previous alcohol use. She reports that she does not use drugs.  Patient is not married.  She lives at home alone.  She has a son who is involved in her care, who lives nearby.  Patient previously worked at [employer] in [place] and recently stopped working due to [employer].  ADVANCE DIRECTIVES:  Does not have  CODE STATUS:   PAST MEDICAL HISTORY: Past Medical History:  Diagnosis Date  . Anemia     . Cancer ([place])    cwervical cancer  . Cervical cancer ([place])   . No pertinent past medical history     PAST SURGICAL HISTORY:  Past Surgical History:  Procedure Laterality Date  . CYSTOSCOPY W/ URETERAL STENT PLACEMENT Right 10/02/2019   Procedure: CYSTOSCOPY WITH RETROGRADE PYELOGRAM/URETERAL STENT PLACEMENT;  Surgeon: Hollice Espy, MD;  Location: [facility] ORS;  Service: Urology;  Laterality: Right;  . IVC FILTER REMOVAL N/A 08/07/2019   Procedure: IVC FILTER REMOVAL;  Surgeon: Katha Cabal, MD;  Location: [place] [facility];  Service: Cardiovascular;  Laterality: N/A;  . MASS EXCISION     Throat  . PERIPHERAL VASCULAR THROMBECTOMY Left 06/05/2019   Procedure: PERIPHERAL VASCULAR THROMBECTOMY WITH IVC FILTER (LEFT LOWER EXTREMITY);  Surgeon: Katha Cabal, MD;  Location: [place] [facility];  Service: Cardiovascular;  Laterality: Left;  . PORTA CATH INSERTION N/A 06/06/2019   Procedure: PORTA CATH INSERTION;  Surgeon: Katha Cabal, MD;  Location: [place] [facility];  Service: Cardiovascular;  Laterality: N/A;  . TONSILLECTOMY      HEMATOLOGY/ONCOLOGY HISTORY:  Oncology History  Cervical cancer ([place])  05/04/2019 Initial Diagnosis   Cervical cancer ([place])   05/17/2019 - 07/04/2019 Chemotherapy   The patient had palonosetron (ALOXI) injection 0.25 mg, 0.25 mg, Intravenous,  Once, 6 of 7 cycles Administration: 0.25 mg (05/17/2019), 0.25 mg (05/24/2019), 0.25 mg (05/31/2019), 0.25 mg (06/14/2019), 0.25 mg (06/22/2019), 0.25 mg (06/28/2019) CISplatin (PLATINOL) 61 mg in sodium chloride 0.9 % 250 mL chemo infusion, 40 mg/m2 = 61 mg, Intravenous,  Once, 6 of 7 cycles Administration: 61 mg (05/17/2019), 61 mg (05/24/2019), 61  mg (05/31/2019), 61 mg (06/14/2019), 61 mg (06/22/2019), 61 mg (06/28/2019) fosaprepitant (EMEND) 150 mg, dexamethasone (DECADRON) 12 mg in sodium chloride 0.9 % 145 mL IVPB, , Intravenous,  Once, 6 of 7 cycles Administration:  (05/17/2019),  (05/24/2019),  (05/31/2019),   (06/14/2019),  (06/22/2019),  (06/28/2019)  for chemotherapy treatment.    07/20/2019 - 09/20/2019 Chemotherapy   The patient had palonosetron (ALOXI) injection 0.25 mg, 0.25 mg, Intravenous,  Once, 3 of 6 cycles Administration: 0.25 mg (07/20/2019), 0.25 mg (08/10/2019), 0.25 mg (08/31/2019) pegfilgrastim (NEULASTA ONPRO KIT) injection 6 mg, 6 mg, Subcutaneous, Once, 2 of 5 cycles Administration: 6 mg (07/20/2019), 6 mg (08/31/2019) CARBOplatin (PARAPLATIN) 520 mg in sodium chloride 0.9 % 250 mL chemo infusion, 520 mg (95.1 % of original dose 541.8 mg), Intravenous,  Once, 3 of 6 cycles Dose modification:   (original dose 541.8 mg, Cycle 1) Administration: 520 mg (07/20/2019), 520 mg (08/10/2019), 520 mg (08/31/2019) PACLitaxel (TAXOL) 300 mg in sodium chloride 0.9 % 250 mL chemo infusion (> '80mg'$ /m2), 200 mg/m2 = 300 mg, Intravenous,  Once, 3 of 6 cycles Administration: 300 mg (07/20/2019), 300 mg (08/10/2019), 300 mg (08/31/2019) fosaprepitant (EMEND) 150 mg, dexamethasone (DECADRON) 12 mg in sodium chloride 0.9 % 145 mL IVPB, , Intravenous,  Once, 3 of 6 cycles Administration:  (07/20/2019),  (08/10/2019),  (08/31/2019)  for chemotherapy treatment.    10/30/2019 -  Chemotherapy   The patient had pembrolizumab (KEYTRUDA) 200 mg in sodium chloride 0.9 % 50 mL chemo infusion, 200 mg, Intravenous, Once, 2 of 6 cycles Administration: 200 mg (10/30/2019), 200 mg (11/20/2019)  for chemotherapy treatment.    11/17/2019 Cancer Staging   Staging form: Cervix Uteri, AJCC 8th Edition - Clinical stage from 11/17/2019: FIGO Stage IVB (cT3b, cN1, pM1) - Signed by Lloyd Huger, MD on 11/17/2019     ALLERGIES:  has No Known Allergies.  MEDICATIONS:  Current Outpatient Medications  Medication Sig Dispense Refill  . ALPRAZolam (XANAX) 0.25 MG tablet Take 1 tablet (0.25 mg total) by mouth 2 (two) times daily as needed for anxiety. 45 tablet 0  . apixaban (ELIQUIS) 5 MG TABS tablet Take 1 tablet (5 mg total) by  mouth 2 (two) times daily. 180 tablet 1  . dexamethasone (DECADRON) 4 MG tablet Take 1 tablet (4 mg total) by mouth daily. 14 tablet 0  . mirtazapine (REMERON) 7.5 MG tablet Take 1 tablet (7.5 mg total) by mouth at bedtime. 30 tablet 2  . ondansetron (ZOFRAN) 8 MG tablet Take 1 tablet (8 mg total) by mouth every 8 (eight) hours as needed for nausea or vomiting. 45 tablet 0  . oxybutynin (DITROPAN-XL) 5 MG 24 hr tablet Take 1 tablet (5 mg total) by mouth at bedtime. TAKE 1 TABLET BY MOUTH EVERYDAY AT BEDTIME 30 tablet 0  . oxyCODONE (OXY IR/ROXICODONE) 5 MG immediate release tablet Take 1-2 tablets (5-10 mg total) by mouth every 4 (four) hours as needed for severe pain. 60 tablet 0  . oxyCODONE ER (XTAMPZA ER) 13.5 MG C12A Take 1 capsule by mouth every 8 (eight) hours. 90 capsule 0  . tamsulosin (FLOMAX) 0.4 MG CAPS capsule TAKE 1 CAPSULE BY MOUTH EVERY DAY 30 capsule 1   No current facility-administered medications for this visit.    VITAL SIGNS: There were no vitals taken for this visit. There were no vitals filed for this visit.  Estimated body mass index is 19.57 kg/m as calculated from the following:   Height as of 10/02/19: '5\' 2"'$  (1.575  m).   Weight as of an earlier encounter on 11/20/19: 107 lb (48.5 kg).  LABS: CBC:    Component Value Date/Time   WBC 5.9 11/20/2019 1047   HGB 10.3 (L) 11/20/2019 1047   HGB 14.5 04/09/2019 0918   HCT 33.9 (L) 11/20/2019 1047   HCT 44.2 04/09/2019 0918   PLT 358 11/20/2019 1047   PLT 401 04/09/2019 0918   MCV 100.3 (H) 11/20/2019 1047   MCV 99 (H) 04/09/2019 0918   NEUTROABS 4.2 11/20/2019 1047   NEUTROABS 5.9 04/09/2019 0918   LYMPHSABS 0.8 11/20/2019 1047   LYMPHSABS 1.3 04/09/2019 0918   MONOABS 0.7 11/20/2019 1047   EOSABS 0.1 11/20/2019 1047   EOSABS 0.2 04/09/2019 0918   BASOSABS 0.1 11/20/2019 1047   BASOSABS 0.1 04/09/2019 0918   Comprehensive Metabolic Panel:    Component Value Date/Time   NA 136 11/20/2019 1047   NA 138  04/09/2019 0918   K 3.5 11/20/2019 1047   CL 101 11/20/2019 1047   CO2 25 11/20/2019 1047   BUN 13 11/20/2019 1047   BUN 12 04/09/2019 0918   CREATININE 0.84 11/20/2019 1047   GLUCOSE 130 (H) 11/20/2019 1047   CALCIUM 9.8 11/20/2019 1047   AST 26 11/20/2019 1047   ALT 11 11/20/2019 1047   ALKPHOS 123 11/20/2019 1047   BILITOT 0.6 11/20/2019 1047   BILITOT 0.4 04/09/2019 0918   PROT 7.2 11/20/2019 1047   PROT 6.8 04/09/2019 0918   ALBUMIN 3.4 (L) 11/20/2019 1047   ALBUMIN 4.0 04/09/2019 0918    RADIOGRAPHIC STUDIES: CT ABDOMEN PELVIS W CONTRAST  Result Date: 10/29/2019 CLINICAL DATA:  54 year old female with history of cervical cancer. History of right ureteral stent placed on 10/02/2019. Abdominal pain since that time. EXAM: CT ABDOMEN AND PELVIS WITH CONTRAST TECHNIQUE: Multidetector CT imaging of the abdomen and pelvis was performed using the standard protocol following bolus administration of intravenous contrast. CONTRAST:  186m OMNIPAQUE IOHEXOL 300 MG/ML  SOLN COMPARISON:  CT the chest, abdomen and pelvis 05/01/2019. FINDINGS: Lower chest: Mild scarring or atelectasis in the lung bases bilaterally. Hepatobiliary: The posterior aspect of the right lobe of the liver involving portions of both segments 6 and 7 there is a 3.5 x 1.4 cm peripheral hypovascular lesion which is favored to represent a capsular implant. Similarly, there is a smaller capsular lesion overlying segment 6 (axial image 22 of series 2). No intra or extrahepatic biliary ductal dilatation. Small non dependent 5 mm lesion in the gallbladder, favored to represent a gallbladder polyp. Pancreas: No pancreatic mass. No pancreatic ductal dilatation. No pancreatic or peripancreatic fluid collections or inflammatory changes. Spleen: Unremarkable. Adrenals/Urinary Tract: In the upper pole of the right kidney there is a 9 mm low-attenuation lesion, in both kidneys there are subcentimeter low-attenuation lesions, too small to  characterize, but favored to represent cysts. Right-sided double-J ureteral stent which appears properly located. Mild right hydroureteronephrosis despite the presence of a stent. No left-sided hydroureteronephrosis. Urinary bladder is normal in appearance. Stomach/Bowel: Normal appearance of the stomach. No pathologic dilatation of small bowel or colon. The appendix is not confidently identified and may be surgically absent. Regardless, there are no inflammatory changes noted adjacent to the cecum to suggest the presence of an acute appendicitis at this time. Vascular/Lymphatic: Aortic atherosclerosis, without evidence of aneurysm or dissection in the abdominal or pelvic vasculature. Left common iliac vein stent which appears patent. Abnormal soft tissue in the cul-de-sac immediately anterior to the distal sigmoid colon and/or proximal rectum  best appreciated on axial image 63 of series 2 and coronal image 85 of series 4 measuring 2.4 x 1.6 x 2.6 cm, and on axial image 62 of series 2 and coronal image 85 of series 4 measuring 1.7 x 1.2 x 2.6 cm, compatible with lymph nodes and/or metastatic intraperitoneal implants. Likewise, some abnormal soft tissue is noted along the right pelvic sidewall adjacent to the distal third of the right ureter measuring approximately 2.8 x 2.1 x 3.3 cm (axial image 55 of series 2 and coronal image 71 of series 4), likely an enlarged lymph node. These all appear increased compared to the prior study. Other lymph nodes appear smaller than the prior examination, including a right obturator lymph node (axial image 63 of series 2) which currently measures only 9 mm in short axis (previously 2.2 cm) additionally, previously noted bulky retroperitoneal lymphadenopathy has largely resolved, now with some amorphous soft tissue in the retroperitoneum encasing the abdominal aorta best appreciated on axial image 27 of series 2. The largest single retroperitoneal lymph node identified on today's  examination measures 1 cm in short axis in the left para-aortic nodal station (axial image 25 of series 2). Reproductive: The uterus has further enlarged and is more heterogeneous in appearance than the prior examination, currently measuring approximately 15.0 x 6.4 x 8.0 cm. Diffuse infiltration of the uterus with multiple hypovascular masses, which also demonstrate extensive involvement of the lower uterine segment and cervical region, particularly anteriorly. There is a tiny locule of gas in the vaginal apex, likely related to recent pelvic examination. Other: Trace volume of ascites, slightly increased compared to the prior examination. No pneumoperitoneum. Musculoskeletal: Tiny sclerotic lesion in the right parasymphyseal region and mixed lucent and sclerotic lesion in the left sacrum and adjacent left ilium all appear similar to the prior examination, and were not hypermetabolic on prior PET-CT, suggesting benign lesions. There are no other aggressive appearing lytic or blastic lesions noted in the visualized portions of the skeleton. IMPRESSION: 1. Today's study demonstrates a mixed response to therapy with further enlargement of the lesions throughout the cervix and uterus, some increasing lymphadenopathy along the right pelvic sidewall adjacent to the distal third of the right ureter, enlarging lymph nodes and/or metastatic deposits in the cul-de-sac, and new capsular implants associated with the right lobe of the liver with slight increase in trace volume of ascites. Other bulky lymphadenopathy in the pelvis and retroperitoneum has regressed compared to the prior examination. 2. Right-sided double-J ureteral stent appears appropriately located. Despite the presence of the stent there is persistent mild right hydroureteronephrosis. 3. Aortic atherosclerosis. 4. Additional incidental findings, as above. Electronically Signed   By: Vinnie Langton M.D.   On: 10/29/2019 16:27    PERFORMANCE STATUS (ECOG) :  1 - Symptomatic but completely ambulatory  Review of Systems Unless otherwise noted, a complete review of systems is negative.  Physical Exam General: NAD, frail appearing, thin Pulmonary: Unlabored Extremities: no edema Skin: no rashes Neurological: Weakness but otherwise nonfocal  IMPRESSION: Routine follow-up visit.   Patient reports having 1 to 2 weeks of increased vaginal bleeding.  She is saturating about 4 pads per day.  We will schedule for her to be seen by GYN oncology tomorrow.  Patient endorses generalized abdominal discomfort.  Pain is currently about 6-7 out of 10.  She is taking oxycodone 5 mg about every 4 hours around-the-clock.  She has tolerated the OxyContin 50 mg every 8 hours.  However, her insurance is no longer covering  the OxyContin.  Will rotate to [facility].  Insurance will also not cover every 8 hour dosing, so we will increase the equivalent dose of Xtampza ER and switch to twice daily dosing.  Discussed constipation management with recommendation of increasing senna to 2 tablets twice daily and adding daily MiraLAX.  Patient has occasional nausea.  Will start as needed ondansetron.  Oral intake is poor.  Patient was previously prescribed mirtazapine but says that she did not start it after reading the side effect profile.  Will start trial of dexamethasone.  I have previously sent patient home with a MOST Form.  Case discussed with Dr. Grayland Ormond.  PLAN: -Continue current scope of treatment -Start Xtampza ER 27 mg every 12 hours -Continue oxycodone IR 5 to 10 mg every 4 hours as needed for breakthrough pain -Dexamethasone 4 mg daily -Ondansetron 8 mg every 8 hours as needed for nausea -Increase bowel regimen with daily Senokot/MiraLAX -ACP documents and most form previously reviewed -RTC in 2-3 weeks   Patient expressed understanding and was in agreement with this plan. She also understands that She can call the clinic at any time with any  questions, concerns, or complaints.     Time Total: 30 minutes  Visit consisted of counseling and education dealing with the complex and emotionally intense issues of symptom management and palliative care in the setting of serious and potentially life-threatening illness.Greater than 50%  of this time was spent counseling and coordinating care related to the above assessment and plan.  Signed by: Altha Harm, PhD, NP-C [phone] (Work Cell)

## 2019-11-21 ENCOUNTER — Inpatient Hospital Stay: Payer: BC Managed Care – PPO

## 2019-11-21 ENCOUNTER — Encounter: Payer: Self-pay | Admitting: Oncology

## 2019-11-21 ENCOUNTER — Other Ambulatory Visit: Payer: Self-pay | Admitting: Emergency Medicine

## 2019-11-21 ENCOUNTER — Inpatient Hospital Stay (HOSPITAL_BASED_OUTPATIENT_CLINIC_OR_DEPARTMENT_OTHER): Payer: BC Managed Care – PPO | Admitting: Obstetrics and Gynecology

## 2019-11-21 ENCOUNTER — Telehealth: Payer: Self-pay | Admitting: Emergency Medicine

## 2019-11-21 ENCOUNTER — Inpatient Hospital Stay (HOSPITAL_BASED_OUTPATIENT_CLINIC_OR_DEPARTMENT_OTHER): Payer: BC Managed Care – PPO | Admitting: Hospice and Palliative Medicine

## 2019-11-21 ENCOUNTER — Encounter: Payer: Self-pay | Admitting: Obstetrics and Gynecology

## 2019-11-21 ENCOUNTER — Other Ambulatory Visit: Payer: Self-pay

## 2019-11-21 ENCOUNTER — Encounter: Payer: Self-pay | Admitting: Nurse Practitioner

## 2019-11-21 VITALS — BP 104/80 | HR 90 | Temp 98.5°F | Resp 18 | Wt 107.0 lb

## 2019-11-21 DIAGNOSIS — E860 Dehydration: Secondary | ICD-10-CM | POA: Diagnosis not present

## 2019-11-21 DIAGNOSIS — C778 Secondary and unspecified malignant neoplasm of lymph nodes of multiple regions: Secondary | ICD-10-CM

## 2019-11-21 DIAGNOSIS — G893 Neoplasm related pain (acute) (chronic): Secondary | ICD-10-CM

## 2019-11-21 DIAGNOSIS — C7911 Secondary malignant neoplasm of bladder: Secondary | ICD-10-CM | POA: Diagnosis not present

## 2019-11-21 DIAGNOSIS — F419 Anxiety disorder, unspecified: Secondary | ICD-10-CM | POA: Diagnosis not present

## 2019-11-21 DIAGNOSIS — N938 Other specified abnormal uterine and vaginal bleeding: Secondary | ICD-10-CM | POA: Diagnosis not present

## 2019-11-21 DIAGNOSIS — C538 Malignant neoplasm of overlapping sites of cervix uteri: Secondary | ICD-10-CM

## 2019-11-21 DIAGNOSIS — K5900 Constipation, unspecified: Secondary | ICD-10-CM | POA: Diagnosis not present

## 2019-11-21 DIAGNOSIS — R630 Anorexia: Secondary | ICD-10-CM | POA: Diagnosis not present

## 2019-11-21 DIAGNOSIS — C786 Secondary malignant neoplasm of retroperitoneum and peritoneum: Secondary | ICD-10-CM

## 2019-11-21 DIAGNOSIS — C530 Malignant neoplasm of endocervix: Secondary | ICD-10-CM

## 2019-11-21 DIAGNOSIS — R140 Abdominal distension (gaseous): Secondary | ICD-10-CM | POA: Diagnosis not present

## 2019-11-21 DIAGNOSIS — Z515 Encounter for palliative care: Secondary | ICD-10-CM | POA: Diagnosis not present

## 2019-11-21 DIAGNOSIS — D500 Iron deficiency anemia secondary to blood loss (chronic): Secondary | ICD-10-CM | POA: Diagnosis not present

## 2019-11-21 DIAGNOSIS — Z5112 Encounter for antineoplastic immunotherapy: Secondary | ICD-10-CM | POA: Diagnosis not present

## 2019-11-21 DIAGNOSIS — N939 Abnormal uterine and vaginal bleeding, unspecified: Secondary | ICD-10-CM

## 2019-11-21 DIAGNOSIS — G4700 Insomnia, unspecified: Secondary | ICD-10-CM | POA: Diagnosis not present

## 2019-11-21 DIAGNOSIS — Z79899 Other long term (current) drug therapy: Secondary | ICD-10-CM | POA: Diagnosis not present

## 2019-11-21 MED ORDER — MORPHINE SULFATE (PF) 2 MG/ML IV SOLN
2.0000 mg | Freq: Once | INTRAVENOUS | Status: AC
  Administered 2019-11-21: 2 mg via INTRAVENOUS
  Filled 2019-11-21: qty 1

## 2019-11-21 MED ORDER — OXYCODONE ER 27 MG PO C12A: 1.0000 | EXTENDED_RELEASE_CAPSULE | Freq: Two times a day (BID) | ORAL | 0 refills | Status: DC

## 2019-11-21 MED ORDER — MORPHINE SULFATE (PF) 2 MG/ML IV SOLN
INTRAVENOUS | Status: AC
  Filled 2019-11-21: qty 1

## 2019-11-21 MED ORDER — MORPHINE SULFATE (PF) 2 MG/ML IV SOLN
2.0000 mg | Freq: Once | INTRAVENOUS | Status: AC
  Administered 2019-11-21: 2 mg via INTRAVENOUS

## 2019-11-21 MED ORDER — MORPHINE SULFATE ER 15 MG PO TBCR: 15.0000 mg | EXTENDED_RELEASE_TABLET | Freq: Three times a day (TID) | ORAL | 0 refills | Status: DC

## 2019-11-21 NOTE — Addendum Note (Signed)
Addended by: Irean Hong on: 11/21/2019 09:33 AM   Modules accepted: Orders

## 2019-11-21 NOTE — Progress Notes (Signed)
[place]  [phone] [phone]   Name: Mariah Mcmahon Date: 11/21/2019 MRN: [id number]  DOB: [birth year]  Patient Care Team: Venita Lick, NP as PCP - General (Nurse Practitioner) Clent Jacks, RN as Oncology Nurse Navigator    REASON FOR CONSULTATION: [name] is a 54 y.o. 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  Patient is felt not to be a surgical candidate and treatment was initiated with concurrent chemoradiation.  Patient was hospitalized 06/05/2019 -06/07/2019 with acute left lower extremity DVT/pulmonary embolism status post thrombolysis and IVC filter placement.  IVC filter was later removed.  Patient had disease progression on carbotaxol and was referred to [facility] for a clinical trial.  PET on 09/17/2019, which revealed interval progression of hypermetabolic nodal disease in chest, abdomen, and pelvis with new hypermetabolic disease in the region of the right vaginal fornix/cervix. Decision was made to proceed with systemic chemotherapy. CT scan on 12/21 revealed mixed response to treatment. Patient was rotated to [facility]. Patient was referred to palliative care to help address goals and manage ongoing symptoms.  SOCIAL HISTORY:     reports that she quit smoking about 4 months ago. Her smoking use included cigarettes. She has a 15.00 pack-year smoking history. She has never used smokeless tobacco. She reports previous alcohol use. She reports that she does not use drugs.  Patient is not married.  She lives at home alone.  She has a son who is involved in her care, who lives nearby.  Patient previously worked at [employer] in [place] and recently stopped working due to [employer].  ADVANCE DIRECTIVES:  Does not have  CODE STATUS:   PAST MEDICAL HISTORY: Past Medical History:  Diagnosis Date  . Anemia     . Cancer ([place])    cwervical cancer  . Cervical cancer ([place])   . No pertinent past medical history     PAST SURGICAL HISTORY:  Past Surgical History:  Procedure Laterality Date  . CYSTOSCOPY W/ URETERAL STENT PLACEMENT Right 10/02/2019   Procedure: CYSTOSCOPY WITH RETROGRADE PYELOGRAM/URETERAL STENT PLACEMENT;  Surgeon: Hollice Espy, MD;  Location: [facility] ORS;  Service: Urology;  Laterality: Right;  . IVC FILTER REMOVAL N/A 08/07/2019   Procedure: IVC FILTER REMOVAL;  Surgeon: Katha Cabal, MD;  Location: [place] [facility];  Service: Cardiovascular;  Laterality: N/A;  . MASS EXCISION     Throat  . PERIPHERAL VASCULAR THROMBECTOMY Left 06/05/2019   Procedure: PERIPHERAL VASCULAR THROMBECTOMY WITH IVC FILTER (LEFT LOWER EXTREMITY);  Surgeon: Katha Cabal, MD;  Location: [place] [facility];  Service: Cardiovascular;  Laterality: Left;  . PORTA CATH INSERTION N/A 06/06/2019   Procedure: PORTA CATH INSERTION;  Surgeon: Katha Cabal, MD;  Location: [place] [facility];  Service: Cardiovascular;  Laterality: N/A;  . TONSILLECTOMY      HEMATOLOGY/ONCOLOGY HISTORY:  Oncology History  Cervical cancer ([place])  05/04/2019 Initial Diagnosis   Cervical cancer ([place])   05/17/2019 - 07/04/2019 Chemotherapy   The patient had palonosetron (ALOXI) injection 0.25 mg, 0.25 mg, Intravenous,  Once, 6 of 7 cycles Administration: 0.25 mg (05/17/2019), 0.25 mg (05/24/2019), 0.25 mg (05/31/2019), 0.25 mg (06/14/2019), 0.25 mg (06/22/2019), 0.25 mg (06/28/2019) CISplatin (PLATINOL) 61 mg in sodium chloride 0.9 % 250 mL chemo infusion, 40 mg/m2 = 61 mg, Intravenous,  Once, 6 of 7 cycles Administration: 61 mg (05/17/2019), 61 mg (05/24/2019), 61  mg (05/31/2019), 61 mg (06/14/2019), 61 mg (06/22/2019), 61 mg (06/28/2019) fosaprepitant (EMEND) 150 mg, dexamethasone (DECADRON) 12 mg in sodium chloride 0.9 % 145 mL IVPB, , Intravenous,  Once, 6 of 7 cycles Administration:  (05/17/2019),  (05/24/2019),  (05/31/2019),   (06/14/2019),  (06/22/2019),  (06/28/2019)  for chemotherapy treatment.    07/20/2019 - 09/20/2019 Chemotherapy   The patient had palonosetron (ALOXI) injection 0.25 mg, 0.25 mg, Intravenous,  Once, 3 of 6 cycles Administration: 0.25 mg (07/20/2019), 0.25 mg (08/10/2019), 0.25 mg (08/31/2019) pegfilgrastim (NEULASTA ONPRO KIT) injection 6 mg, 6 mg, Subcutaneous, Once, 2 of 5 cycles Administration: 6 mg (07/20/2019), 6 mg (08/31/2019) CARBOplatin (PARAPLATIN) 520 mg in sodium chloride 0.9 % 250 mL chemo infusion, 520 mg (95.1 % of original dose 541.8 mg), Intravenous,  Once, 3 of 6 cycles Dose modification:   (original dose 541.8 mg, Cycle 1) Administration: 520 mg (07/20/2019), 520 mg (08/10/2019), 520 mg (08/31/2019) PACLitaxel (TAXOL) 300 mg in sodium chloride 0.9 % 250 mL chemo infusion (> '80mg'$ /m2), 200 mg/m2 = 300 mg, Intravenous,  Once, 3 of 6 cycles Administration: 300 mg (07/20/2019), 300 mg (08/10/2019), 300 mg (08/31/2019) fosaprepitant (EMEND) 150 mg, dexamethasone (DECADRON) 12 mg in sodium chloride 0.9 % 145 mL IVPB, , Intravenous,  Once, 3 of 6 cycles Administration:  (07/20/2019),  (08/10/2019),  (08/31/2019)  for chemotherapy treatment.    10/30/2019 -  Chemotherapy   The patient had pembrolizumab (KEYTRUDA) 200 mg in sodium chloride 0.9 % 50 mL chemo infusion, 200 mg, Intravenous, Once, 2 of 6 cycles Administration: 200 mg (10/30/2019), 200 mg (11/20/2019)  for chemotherapy treatment.    11/17/2019 Cancer Staging   Staging form: Cervix Uteri, AJCC 8th Edition - Clinical stage from 11/17/2019: FIGO Stage IVB (cT3b, cN1, pM1) - Signed by Lloyd Huger, MD on 11/17/2019     ALLERGIES:  has No Known Allergies.  MEDICATIONS:  Current Outpatient Medications  Medication Sig Dispense Refill  . ALPRAZolam (XANAX) 0.25 MG tablet Take 1 tablet (0.25 mg total) by mouth 2 (two) times daily as needed for anxiety. 45 tablet 0  . apixaban (ELIQUIS) 5 MG TABS tablet Take 1 tablet (5 mg total) by  mouth 2 (two) times daily. 180 tablet 1  . dexamethasone (DECADRON) 4 MG tablet Take 1 tablet (4 mg total) by mouth daily. (Patient not taking: Reported on 11/21/2019) 14 tablet 0  . mirtazapine (REMERON) 7.5 MG tablet Take 1 tablet (7.5 mg total) by mouth at bedtime. (Patient not taking: Reported on 11/21/2019) 30 tablet 2  . ondansetron (ZOFRAN) 8 MG tablet Take 1 tablet (8 mg total) by mouth every 8 (eight) hours as needed for nausea or vomiting. 45 tablet 0  . oxybutynin (DITROPAN-XL) 5 MG 24 hr tablet Take 1 tablet (5 mg total) by mouth at bedtime. TAKE 1 TABLET BY MOUTH EVERYDAY AT BEDTIME 30 tablet 0  . oxyCODONE (OXY IR/ROXICODONE) 5 MG immediate release tablet Take 1-2 tablets (5-10 mg total) by mouth every 4 (four) hours as needed for severe pain. 60 tablet 0  . oxyCODONE ER 27 MG C12A Take 1 capsule by mouth every 12 (twelve) hours. (Patient not taking: Reported on 11/21/2019) 60 capsule 0  . tamsulosin (FLOMAX) 0.4 MG CAPS capsule TAKE 1 CAPSULE BY MOUTH EVERY DAY 30 capsule 1   No current facility-administered medications for this visit.    VITAL SIGNS: There were no vitals taken for this visit. There were no vitals filed for this visit.  Estimated body mass index is  19.57 kg/m as calculated from the following:   Height as of 10/02/19: '5\' 2"'$  (1.575 m).   Weight as of an earlier encounter on 11/21/19: 107 lb (48.5 kg).  LABS: CBC:    Component Value Date/Time   WBC 5.9 11/20/2019 1047   HGB 10.3 (L) 11/20/2019 1047   HGB 14.5 04/09/2019 0918   HCT 33.9 (L) 11/20/2019 1047   HCT 44.2 04/09/2019 0918   PLT 358 11/20/2019 1047   PLT 401 04/09/2019 0918   MCV 100.3 (H) 11/20/2019 1047   MCV 99 (H) 04/09/2019 0918   NEUTROABS 4.2 11/20/2019 1047   NEUTROABS 5.9 04/09/2019 0918   LYMPHSABS 0.8 11/20/2019 1047   LYMPHSABS 1.3 04/09/2019 0918   MONOABS 0.7 11/20/2019 1047   EOSABS 0.1 11/20/2019 1047   EOSABS 0.2 04/09/2019 0918   BASOSABS 0.1 11/20/2019 1047   BASOSABS 0.1  04/09/2019 0918   Comprehensive Metabolic Panel:    Component Value Date/Time   NA 136 11/20/2019 1047   NA 138 04/09/2019 0918   K 3.5 11/20/2019 1047   CL 101 11/20/2019 1047   CO2 25 11/20/2019 1047   BUN 13 11/20/2019 1047   BUN 12 04/09/2019 0918   CREATININE 0.84 11/20/2019 1047   GLUCOSE 130 (H) 11/20/2019 1047   CALCIUM 9.8 11/20/2019 1047   AST 26 11/20/2019 1047   ALT 11 11/20/2019 1047   ALKPHOS 123 11/20/2019 1047   BILITOT 0.6 11/20/2019 1047   BILITOT 0.4 04/09/2019 0918   PROT 7.2 11/20/2019 1047   PROT 6.8 04/09/2019 0918   ALBUMIN 3.4 (L) 11/20/2019 1047   ALBUMIN 4.0 04/09/2019 0918    RADIOGRAPHIC STUDIES: CT ABDOMEN PELVIS W CONTRAST  Result Date: 10/29/2019 CLINICAL DATA:  54 year old female with history of cervical cancer. History of right ureteral stent placed on 10/02/2019. Abdominal pain since that time. EXAM: CT ABDOMEN AND PELVIS WITH CONTRAST TECHNIQUE: Multidetector CT imaging of the abdomen and pelvis was performed using the standard protocol following bolus administration of intravenous contrast. CONTRAST:  143m OMNIPAQUE IOHEXOL 300 MG/ML  SOLN COMPARISON:  CT the chest, abdomen and pelvis 05/01/2019. FINDINGS: Lower chest: Mild scarring or atelectasis in the lung bases bilaterally. Hepatobiliary: The posterior aspect of the right lobe of the liver involving portions of both segments 6 and 7 there is a 3.5 x 1.4 cm peripheral hypovascular lesion which is favored to represent a capsular implant. Similarly, there is a smaller capsular lesion overlying segment 6 (axial image 22 of series 2). No intra or extrahepatic biliary ductal dilatation. Small non dependent 5 mm lesion in the gallbladder, favored to represent a gallbladder polyp. Pancreas: No pancreatic mass. No pancreatic ductal dilatation. No pancreatic or peripancreatic fluid collections or inflammatory changes. Spleen: Unremarkable. Adrenals/Urinary Tract: In the upper pole of the right kidney  there is a 9 mm low-attenuation lesion, in both kidneys there are subcentimeter low-attenuation lesions, too small to characterize, but favored to represent cysts. Right-sided double-J ureteral stent which appears properly located. Mild right hydroureteronephrosis despite the presence of a stent. No left-sided hydroureteronephrosis. Urinary bladder is normal in appearance. Stomach/Bowel: Normal appearance of the stomach. No pathologic dilatation of small bowel or colon. The appendix is not confidently identified and may be surgically absent. Regardless, there are no inflammatory changes noted adjacent to the cecum to suggest the presence of an acute appendicitis at this time. Vascular/Lymphatic: Aortic atherosclerosis, without evidence of aneurysm or dissection in the abdominal or pelvic vasculature. Left common iliac vein stent which appears patent.  Abnormal soft tissue in the cul-de-sac immediately anterior to the distal sigmoid colon and/or proximal rectum best appreciated on axial image 63 of series 2 and coronal image 85 of series 4 measuring 2.4 x 1.6 x 2.6 cm, and on axial image 62 of series 2 and coronal image 85 of series 4 measuring 1.7 x 1.2 x 2.6 cm, compatible with lymph nodes and/or metastatic intraperitoneal implants. Likewise, some abnormal soft tissue is noted along the right pelvic sidewall adjacent to the distal third of the right ureter measuring approximately 2.8 x 2.1 x 3.3 cm (axial image 55 of series 2 and coronal image 71 of series 4), likely an enlarged lymph node. These all appear increased compared to the prior study. Other lymph nodes appear smaller than the prior examination, including a right obturator lymph node (axial image 63 of series 2) which currently measures only 9 mm in short axis (previously 2.2 cm) additionally, previously noted bulky retroperitoneal lymphadenopathy has largely resolved, now with some amorphous soft tissue in the retroperitoneum encasing the abdominal aorta  best appreciated on axial image 27 of series 2. The largest single retroperitoneal lymph node identified on today's examination measures 1 cm in short axis in the left para-aortic nodal station (axial image 25 of series 2). Reproductive: The uterus has further enlarged and is more heterogeneous in appearance than the prior examination, currently measuring approximately 15.0 x 6.4 x 8.0 cm. Diffuse infiltration of the uterus with multiple hypovascular masses, which also demonstrate extensive involvement of the lower uterine segment and cervical region, particularly anteriorly. There is a tiny locule of gas in the vaginal apex, likely related to recent pelvic examination. Other: Trace volume of ascites, slightly increased compared to the prior examination. No pneumoperitoneum. Musculoskeletal: Tiny sclerotic lesion in the right parasymphyseal region and mixed lucent and sclerotic lesion in the left sacrum and adjacent left ilium all appear similar to the prior examination, and were not hypermetabolic on prior PET-CT, suggesting benign lesions. There are no other aggressive appearing lytic or blastic lesions noted in the visualized portions of the skeleton. IMPRESSION: 1. Today's study demonstrates a mixed response to therapy with further enlargement of the lesions throughout the cervix and uterus, some increasing lymphadenopathy along the right pelvic sidewall adjacent to the distal third of the right ureter, enlarging lymph nodes and/or metastatic deposits in the cul-de-sac, and new capsular implants associated with the right lobe of the liver with slight increase in trace volume of ascites. Other bulky lymphadenopathy in the pelvis and retroperitoneum has regressed compared to the prior examination. 2. Right-sided double-J ureteral stent appears appropriately located. Despite the presence of the stent there is persistent mild right hydroureteronephrosis. 3. Aortic atherosclerosis. 4. Additional incidental findings,  as above. Electronically Signed   By: Vinnie Langton M.D.   On: 10/29/2019 16:27    PERFORMANCE STATUS (ECOG) : 1 - Symptomatic but completely ambulatory  Review of Systems Unless otherwise noted, a complete review of systems is negative.  Physical Exam General: NAD, frail appearing, thin Pulmonary: Unlabored Extremities: no edema Skin: no rashes Neurological: Weakness but otherwise nonfocal  IMPRESSION: Patient was an add-on to my clinic schedule today due to acute on chronic pain.  Unfortunately, patient ran out of her OxyContin and has been unable to have the [facility] ER prescription filled due to insurance problems and exorbitant cost.  I called and spoke with her pharmacy.  Will rotate to MS Contin, which should be more affordable under her insurance plan.   Patient has  been using ~'75mg'$  oxycodone per 24 hours.  Total oral MME is approximately 113 mg/24 hours.   We will plan to start MS Contin 15 mg every 8 hours, which reflects a dose reduction of greater than 50% to account for incomplete cross tolerance.  Patient may utilize more oxycodone IR while bridging to new LAO.   PLAN: -Continue current scope of treatment -Start MS Contin 15 mg every 8 hours (#45) -Continue oxycodone IR 5 to 10 mg every 3-4 hours as needed for breakthrough pain -Continue dexamethasone 4 mg daily -ACP documents and most form previously reviewed. Will need to readdress during future visit -RTC in 2-3 weeks   Patient expressed understanding and was in agreement with this plan. She also understands that She can call the clinic at any time with any questions, concerns, or complaints.     Time Total: 30 minutes  Visit consisted of counseling and education dealing with the complex and emotionally intense issues of symptom management and palliative care in the setting of serious and potentially life-threatening illness.Greater than 50%  of this time was spent counseling and coordinating care  related to the above assessment and plan.  Signed by: Altha Harm, PhD, NP-C [phone] (Work Cell)

## 2019-11-21 NOTE — Telephone Encounter (Signed)
Got in touch with patient and told her that we would be able to work her in this afternoon if she could get here. Did tell her that we have an appointment open at 3 so if she could be here around then that would work. Pt reports that she was finally able to get some sleep last night after taking xanax, but had a rough night due to pain. Reports that aunt is supposed to go pick up her pain meds at the pharmacy. Gyn/onc notified.

## 2019-11-21 NOTE — Progress Notes (Deleted)
Gynecologic Oncology Interval Visit   Referring Provider: Dr. Georgianne Fick  Chief Complaint: Malignant Neoplasm of Endocervix  Subjective:  Mariah Mcmahon is a 54 y.o. G31P0011 female, diagnosed with stage IIIB cervical cancer, initially seen in consultation from Dr. Georgianne Fick, who returns to clinic for acute visit increased vaginal bleeding and uncontrolled pain.  She suffered several small pulmonary emboli in July 2020 with extensive acute occlusive DVT in the left common femoral vein, popliteal veins.  She continues anticoagulation.  She completed 6 cycles of chemotherapy today  Right ureteral stent placed on 10/02/2019  10/29/2019 for acute abdominal pain showed mixed response to therapy with further enlargement lesions of the cervix and uterus, increased lymphadenopathy along right pelvic sidewall adjacent to the distal third of the right ureter, enlarging lymph nodes, and/or metastatic deposits in the cul-de-sac, and new capsular implants associated with the right lymphoproliferative with slight increase in trace volume of ascites, bulky lymphadenopathy and atrophic symmetric peritoneum has regressed considerably prior to disease.  Despite presence of distant biliary stents mild persistent right hydroureteronephrosis.   Since she was last seen she has completed 3 additional cycles of therapy with carboplatin and paclitaxel. Last dose 08/31/2019.   07/20/2019-08/31/2019- carbo (AUC 6)-paclitaxel ('200mg'$ /m2)  09/17/2019- PET- progression of hypermetabolic nodal disease in chest/abdomen/pelvis. New hypermetabolic disease in right vaginal fornix/cervix.   Gynecologic Oncology History:  Initially presented to PCP for abnormal uterine bleeding, pelvic and back pain.  Lumbar spine x-ray was normal.  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  She reported heavy and painful irregular bleeding.  Pain is present outside of menses.  She is a smoker with 15-pack-year history.  Denies  history of abnormal Paps but prior results not available for review.  She presented to Dr. Georgianne Fick on 04/24/2019.  On exam, barrel-shaped with mass-effect of left portion of cervix, cervix grossly enlarged, friable with contact bleeding colposcopy was performed.  Urine pregnancy test was negative.    Diagnosis: 1.  Cervix, biopsy, 12:00 -High-grade squamous intraepithelial lesion, CIN-3 2.  Cervix, biopsy, 3:00 -High-grade squamous intraepithelial lesion, CIN-3 3.  Cervix, biopsy, 5:00 -High-grade squamous intraepithelial lesion, CIN-3 with foci suspicious for early stromal microinvasion 4.  Endocervix, curettage -High-grade squamous intraepithelial lesion, CIN-3  HIV screening-non-reactive (04/09/2019)  05/02/2019- CT C/A/P 1. Heterogeneous enlargement of the uterus and cervix with apparent soft tissue thickening in the upper vagina. Cervical and vaginal tissues not well evaluated by CT. 2. Bulky retroperitoneal lymphadenopathy in the abdomen with bilateral common iliac and pelvic sidewall lymphadenopathy in the pelvis. Imaging features consistent with metastatic disease. 3. 5 cm soft tissue lesion to the left of the bladder is consistent with a metastatic deposit. 4. Mild to moderate right hydroureteronephrosis with decreased perfusion to the right kidney. Right ureteral obstruction is at the level of the right pelvic sidewall. 5. 2.2 cm mixed lytic and lucent lesion in the left sacrum, indeterminate, but metastatic disease not excluded.  6. Several 3-4 mm nodules identified in the lungs. Close attention on follow-up recommended as metastatic disease not excluded.  7. Small volume ascites.  05/07/2019- PET- Initial for staging IMPRESSION: 1. Hypermetabolic mass in the cervix compatible with known primary cervical malignancy. Hypermetabolism throughout the enlarged lower uterine segment and body of the uterus compatible with malignant involvement of the uterus. 2. Hypermetabolic right  internal and external iliac, bilateral common iliac, aortocaval, left para-aortic and bilateral retrocrural lymphadenopathy compatible with metastatic nodal disease. 3. Hypermetabolic soft tissue implant in the anterior left pelvis surrounded  by small volume pelvic ascites, favor pelvic peritoneal metastasis. 4. No hypermetabolic osseous metastatic disease. No hypermetabolism associated with the mixed lytic and sclerotic upper left sacral lesion, which is probably benign. 5. Tiny bilateral scattered solid pulmonary nodules, below PET resolution, indeterminate, recommend attention on close chest CT follow-up in 3 months. 6.  Aortic Atherosclerosis (ICD10-I70.0).  05/17/2019-06/28/2019- cisplatin & radiation   PET- 07/10/2019- mixed response; new hypermetabolic left supraclavicular, paratracheal, and subcarinal lymph nodes compatible with nodal progression in chest and neck. Decreased hypermetabolism within retroperitoneal nodal metastases. Complete resolution of hypermetabolic activity within cervix, uterus, and low left pelvic peritoneal mets.   Problem List: Patient Active Problem List   Diagnosis Date Noted  . Goals of care, counseling/discussion 11/01/2019  . Current smoker 09/25/2019  . High risk medication use 09/24/2019  . Other specified personal risk factors, not elsewhere classified 09/24/2019  . DVT of lower limb, acute ([place]) 06/05/2019  . Iron deficiency anemia secondary to blood loss (chronic) 05/24/2019  . Examination of participant in clinical trial 05/12/2019  . Cervical cancer ([place]) 05/04/2019  . Generalized abdominal pain 04/09/2019  . Acute midline low back pain without sciatica 04/09/2019  . Nicotine dependence, cigarettes, uncomplicated 25/03/3975    Past Medical History: Past Medical History:  Diagnosis Date  . Anemia   . Cancer ([place])    cwervical cancer  . Cervical cancer ([place])   . No pertinent past medical history     Past Surgical History: Past Surgical  History:  Procedure Laterality Date  . CYSTOSCOPY W/ URETERAL STENT PLACEMENT Right 10/02/2019   Procedure: CYSTOSCOPY WITH RETROGRADE PYELOGRAM/URETERAL STENT PLACEMENT;  Surgeon: Hollice Espy, MD;  Location: [facility];  Service: Urology;  Laterality: Right;  . IVC FILTER REMOVAL N/A 08/07/2019   Procedure: IVC FILTER REMOVAL;  Surgeon: Katha Cabal, MD;  Location: [facility];  Service: Cardiovascular;  Laterality: N/A;  . MASS EXCISION     Throat  . PERIPHERAL VASCULAR THROMBECTOMY Left 06/05/2019   Procedure: PERIPHERAL VASCULAR THROMBECTOMY WITH IVC FILTER (LEFT LOWER EXTREMITY);  Surgeon: Katha Cabal, MD;  Location: [facility];  Service: Cardiovascular;  Laterality: Left;  . PORTA CATH INSERTION N/A 06/06/2019   Procedure: PORTA CATH INSERTION;  Surgeon: Katha Cabal, MD;  Location: [facility];  Service: Cardiovascular;  Laterality: N/A;  . TONSILLECTOMY      Past Gynecologic History:  History of abnormal Paps: Per HPI Contraception: Sexually active: Not currently Menarche: 8th grade Details: 5 days  History of OCP/HRT use:   OB History:  OB History  Gravida Para Term Preterm AB Living  '2 1     1 1  '$ SAB TAB Ectopic Multiple Live Births               # Outcome Date GA Lbr Len/2nd Weight Sex Delivery Anes PTL Lv  2 AB           1 Para             Family History: Family History  Problem Relation Age of Onset  . Brain cancer Mother 6  . Alcohol abuse Father   . Heart disease Father   . Prostate cancer Father   . Heart attack Father   . Diabetes Father     Social History: Social History   Socioeconomic History  . Marital status: Divorced    Spouse name: Not on file  . Number of children: Not on file  . Years of education:  Not on file  . Highest education level: Not on file  Occupational History  . Occupation: [employer]    Employer: [employer] [place]  Tobacco Use  . Smoking status: Former Smoker     Packs/day: 0.50    Years: 30.00    Pack years: 15.00    Types: Cigarettes    Quit date: 07/03/2019    Years since quitting: 0.3  . Smokeless tobacco: Never Used  Substance and Sexual Activity  . Alcohol use: Not Currently  . Drug use: Never  . Sexual activity: Yes    Comment: not at moment  Other Topics Concern  . Not on file  Social History Narrative  . Not on file   Social Determinants of Health   Financial Resource Strain: Low Risk   . Difficulty of Paying Living Expenses: Not hard at all  Food Insecurity: No Food Insecurity  . Worried About Charity fundraiser in the Last Year: Never true  . Ran Out of Food in the Last Year: Never true  Transportation Needs: No Transportation Needs  . Lack of Transportation (Medical): No  . Lack of Transportation (Non-Medical): No  Physical Activity: Sufficiently Active  . Days of Exercise per Week: 6 days  . Minutes of Exercise per Session: 40 min  Stress: No Stress Concern Present  . Feeling of Stress : Not at all  Social Connections: Moderately Isolated  . Frequency of Communication with Friends and Family: More than three times a week  . Frequency of Social Gatherings with Friends and Family: More than three times a week  . Attends Religious Services: Never  . Active Member of Clubs or Organizations: No  . Attends Archivist Meetings: Never  . Marital Status: Divorced  Human resources officer Violence: Not At Risk  . Fear of Current or Ex-Partner: No  . Emotionally Abused: No  . Physically Abused: No  . Sexually Abused: No    Allergies: No Known Allergies  Current Medications: Current Outpatient Medications  Medication Sig Dispense Refill  . ALPRAZolam (XANAX) 0.25 MG tablet Take 1 tablet (0.25 mg total) by mouth 2 (two) times daily as needed for anxiety. 45 tablet 0  . apixaban (ELIQUIS) 5 MG TABS tablet Take 1 tablet (5 mg total) by mouth 2 (two) times daily. 180 tablet 1  . dexamethasone (DECADRON) 4 MG tablet Take 1  tablet (4 mg total) by mouth daily. 14 tablet 0  . mirtazapine (REMERON) 7.5 MG tablet Take 1 tablet (7.5 mg total) by mouth at bedtime. 30 tablet 2  . ondansetron (ZOFRAN) 8 MG tablet Take 1 tablet (8 mg total) by mouth every 8 (eight) hours as needed for nausea or vomiting. 45 tablet 0  . oxybutynin (DITROPAN-XL) 5 MG 24 hr tablet Take 1 tablet (5 mg total) by mouth at bedtime. TAKE 1 TABLET BY MOUTH EVERYDAY AT BEDTIME 30 tablet 0  . oxyCODONE (OXY IR/ROXICODONE) 5 MG immediate release tablet Take 1-2 tablets (5-10 mg total) by mouth every 4 (four) hours as needed for severe pain. 60 tablet 0  . oxyCODONE ER 27 MG C12A Take 1 capsule by mouth every 12 (twelve) hours. 60 capsule 0  . tamsulosin (FLOMAX) 0.4 MG CAPS capsule TAKE 1 CAPSULE BY MOUTH EVERY DAY 30 capsule 1   No current facility-administered medications for this visit.   ***Review of Systems General:  no complaints Skin: no complaints Eyes: no complaints HEENT: no complaints Breasts: no complaints Pulmonary: no complaints Cardiac: no complaints Gastrointestinal: no  complaints Genitourinary/Sexual: no complaints Ob/Gyn: no complaints Musculoskeletal: no complaints Hematology: no complaints Neurologic/Psych: no complaints   Objective:  Physical Examination:  There were no vitals filed for this visit. There is no height or weight on file to calculate BMI.  There were no vitals taken for this visit.    ECOG Performance Status: 1 - Symptomatic but completely ambulatory  ***GENERAL: Patient is a well appearing female in no acute distress HEENT:  PERRL, neck supple with midline trachea. Thyroid without masses.  NODES:  No cervical, supraclavicular, axillary, or inguinal lymphadenopathy palpated.  LUNGS:  Clear to auscultation bilaterally.  No wheezes or rhonchi. HEART:  Regular rate and rhythm. No murmur appreciated. ABDOMEN:  Soft, nontender.  Positive, normoactive bowel sounds.  MSK:  No focal spinal tenderness to  palpation. Full range of motion bilaterally in the upper extremities. EXTREMITIES:  No peripheral edema.   SKIN:  Clear with no obvious rashes or skin changes. No nail dyscrasia. NEURO:  Nonfocal. Well oriented.  Appropriate affect.  ***Pelvic: Exam Chaperoned by NP EGBUS: no lesions Vagina: no gross lesions noted. No vaginal bleeding. On palpation tenderness at the right vaginal fornix but not definite mass/nodularity.  Cervix: Anterior lip of cervix is normal/posterior lip cervix irregular and firm, but no exophytic tumor. The cervix is slightly enlarged but no expanded as noted on prior exam.  Uterus: Globular 12 week size and prominent anteriorly about 2 FB above the pubic bone.   Bimanual/RV: Very firm hard cervix; no adnexal masses, parametria smooth Rectovaginal: not performed  Lab Review Lab Results  Component Value Date   WBC 5.9 11/20/2019   HGB 10.3 (L) 11/20/2019   HCT 33.9 (L) 11/20/2019   MCV 100.3 (H) 11/20/2019   PLT 358 11/20/2019     Chemistry      Component Value Date/Time   NA 136 11/20/2019 1047   NA 138 04/09/2019 0918   K 3.5 11/20/2019 1047   CL 101 11/20/2019 1047   CO2 25 11/20/2019 1047   BUN 13 11/20/2019 1047   BUN 12 04/09/2019 0918   CREATININE 0.84 11/20/2019 1047      Component Value Date/Time   CALCIUM 9.8 11/20/2019 1047   ALKPHOS 123 11/20/2019 1047   AST 26 11/20/2019 1047   ALT 11 11/20/2019 1047   BILITOT 0.6 11/20/2019 1047   BILITOT 0.4 04/09/2019 0918     Repeat labs ordered and T&C.   Radiologic Imaging: As per HPI and interval history    Assessment:  Mariah Mcmahon is a 54 y.o. 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  There is 2.2 cm mixed lytic and lucent lesion in the left sacrum, indeterminate, but metastatic disease not excluded.  Several 3-4 mm nodules identified in the lungs.  Also has area  in the sacrum concerning for metastatic disease.Cervical biopsy only suggestive of microinvasion, but biopsy surely just was not deep enough as the tumor is all submucosal and up in the cervix. CT scan clearly shows advanced cervical cancer. S/p   Pain symptoms due to malignancy, suboptimal control  History of right hydronephrosis noted with some decreased function  Symptomatic anemia.   Thrombocytopenia  Hypokalemia  Medical co-morbidities complicating care: Marland Kitchen  Plan:   Problem List Items Addressed This Visit      Genitourinary   Cervical cancer ([place]) - Primary     Discussed PET scan results.  Additional biopsy will be needed to assess for TMB, MSI, PDL1 to determine if pembrolizumab would be an option. Another alternative is participate on the LIO-1 clinical trial. To be on study she will need to have a biopsy to confirm malignancy and this will also allow for tumor marker evaluation. Additional options include topotecan, bevacizumab, premetrexed and we will anticipate FDA approval for TIL therapy and tisotumab vedotin next year.   Pain symptoms due to malignancy: She is taking Tylenol 500 mg 2 tabs q 12 hours for pain and declines oxycodone. She is very sensitive to narcotics and does not like the side effects. We will reach out to Billey Chang, NP regarding pain control optimization.   History of right hydronephrosis noted with some decreased function -  radiology to review new films. If significant hydronephrosis and functional kidney will refer to Dr Erlene Quan in Urology for evaluation.   Symptomatic anemia - Discussed with Dr. Grayland Ormond and his team will arrange transfusion.   Thrombocytopenia, asymptomatic.   Hypokalemia - arrange of potassium supplementation. Dr. Grayland Ormond aware.   The patient's diagnosis, an outline of the further diagnostic and laboratory studies which will be required, the recommendation for surgery, and alternatives were discussed with her and her accompanying  family members.  All questions were answered to their satisfaction.  A total of 40 minutes were spent with the patient/family today; 40% was spent in education, counseling and coordination of care for cervical cancer.  Beckey Rutter, NP scribed the note. I performed the history, physical, and counseling.   Verlon Au, NP   CC:  Dr. Georgianne Fick

## 2019-11-21 NOTE — Addendum Note (Signed)
Addended by: Irean Hong on: 11/21/2019 09:25 AM   Modules accepted: Orders

## 2019-11-21 NOTE — Progress Notes (Signed)
Gynecologic Oncology Interval Visit   Referring Provider: Dr. Georgianne Fick  Chief Complaint: Malignant Neoplasm of Endocervix  Subjective:  Mariah Mcmahon is a 54 y.o. [id number] female, diagnosed with stage IIIB cervical cancer, initially seen in consultation from Dr. Georgianne Fick, who returns to clinic for acute visit increased vaginal bleeding and uncontrolled pain.  She is having some bleeding and was seen at [facility] and had positive cervical biopsy.  Continues to have significant bleeding and pain. She is losing weight, weak and not too active.  Good support from nearby family.   She suffered several small pulmonary emboli in July 2020 with extensive acute occlusive DVT in the left common femoral vein, popliteal veins.  She continues anticoagulation.  She completed 6 cycles of cisplatin on 06/28/2019 & radiation. PET showed mixed response.  She received 3 cycles of carbo (AUC 6) and paclitaxel (200 mg/m2) 07/20/2019-08/31/2019. PET showed progression.   Right ureteral stent placed on 10/02/2019  She was screened at [facility] for CLOVIS LIO-1 trial. LIO-1: A Phase 1b/2, Open-Label Study to Evaluate the Safety and Efficacy of Lucitanib in Combination With Nivolumab in Patients With An Advanced, Metastatic Solid Tumor Continuing the Study Drug Regimen Beyond Disease Progression IRB# [id number].  Cervical biopsies were obtained. Due to persistent proteinuria she was not a candidate for trail.   Pathology- 10/09/2019 at [place]. Cervix, biopsy: Invasive squamous cell carcinoma, moderately differentiated. B. 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 C. Cervix, biopsy: Invasive squamous cell carcinoma, moderately differentiated.  10/09/2019- PD-L1 was positive- TPS 3% (results scanned under media tab in epic)  10/29/2019 for acute abdominal pain showed mixed response to therapy with  further enlargement lesions of the cervix and uterus, increased lymphadenopathy along right pelvic sidewall adjacent to the distal third of the right ureter, enlarging lymph nodes, and/or metastatic deposits in the cul-de-sac, and new capsular implants associated with the right lymphoproliferative with slight increase in trace volume of ascites, bulky lymphadenopathy and atrophic symmetric peritoneum has regressed considerably prior to disease.  Despite presence of distant biliary stents mild persistent right hydroureteronephrosis.  She initiated Keytruda on 10/30/2019 and is currently s/p 2 cycles.   Today, she reports increased vaginal bleeding, saturating 4 heavy pads per day. Was previously having spotting. She also reports increased pelvic pain. Due to insurance changes she has had to change pain medications and is awaiting approval/authorization and is concerned of cost. She is being seen today by palliative care in clinic.    Gynecologic Oncology History:  Initially presented to PCP for abnormal uterine bleeding, pelvic and back pain.  Lumbar spine x-ray was normal.  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  She reported heavy and painful irregular bleeding.  Pain is present outside of menses.  She is a smoker with 15-pack-year history.  Denies history of abnormal Paps but prior results not available for review.  She presented to Dr. Georgianne Fick on 04/24/2019.  On exam, barrel-shaped with mass-effect of left portion of cervix, cervix grossly enlarged, friable with contact bleeding colposcopy was performed.  Urine pregnancy test was negative.    Diagnosis: 1.  Cervix, biopsy, 12:00 -High-grade squamous intraepithelial lesion, CIN-3 2.  Cervix, biopsy, 3:00 -High-grade squamous intraepithelial lesion, CIN-3 3.  Cervix, biopsy, 5:00 -High-grade squamous intraepithelial lesion, CIN-3 with foci suspicious for early stromal microinvasion 4.  Endocervix, curettage -High-grade  squamous intraepithelial lesion, CIN-3  HIV screening-non-reactive (04/09/2019)  05/02/2019- CT C/A/P  1. Heterogeneous enlargement of the uterus and cervix with apparent soft tissue thickening in the upper vagina. Cervical and vaginal tissues not well evaluated by CT. 2. Bulky retroperitoneal lymphadenopathy in the abdomen with bilateral common iliac and pelvic sidewall lymphadenopathy in the pelvis. Imaging features consistent with metastatic disease. 3. 5 cm soft tissue lesion to the left of the bladder is consistent with a metastatic deposit. 4. Mild to moderate right hydroureteronephrosis with decreased perfusion to the right kidney. Right ureteral obstruction is at the level of the right pelvic sidewall. 5. 2.2 cm mixed lytic and lucent lesion in the left sacrum, indeterminate, but metastatic disease not excluded.  6. Several 3-4 mm nodules identified in the lungs. Close attention on follow-up recommended as metastatic disease not excluded.  7. Small volume ascites.  05/07/2019- PET- Initial for staging IMPRESSION: 1. Hypermetabolic mass in the cervix compatible with known primary cervical malignancy. Hypermetabolism throughout the enlarged lower uterine segment and body of the uterus compatible with malignant involvement of the uterus. 2. Hypermetabolic right internal and external iliac, bilateral common iliac, aortocaval, left para-aortic and bilateral retrocrural lymphadenopathy compatible with metastatic nodal disease. 3. Hypermetabolic soft tissue implant in the anterior left pelvis surrounded by small volume pelvic ascites, favor pelvic peritoneal metastasis. 4. No hypermetabolic osseous metastatic disease. No hypermetabolism associated with the mixed lytic and sclerotic upper left sacral lesion, which is probably benign. 5. Tiny bilateral scattered solid pulmonary nodules, below PET resolution, indeterminate, recommend attention on close chest CT follow-up in 3 months. 6.  Aortic  Atherosclerosis (ICD10-I70.0).  05/17/2019-06/28/2019- cisplatin & external radiation   PET- 07/10/2019- mixed response; new hypermetabolic left supraclavicular, paratracheal, and subcarinal lymph nodes compatible with nodal progression in chest and neck. Decreased hypermetabolism within retroperitoneal nodal metastases. Complete resolution of hypermetabolic activity within cervix, uterus, and low left pelvic peritoneal mets.   She received 3 cycles of carbo (AUC 6) and paclitaxel (200 mg/m2) 07/20/2019-08/31/2019.   09/17/2019- PET- progression of hypermetabolic nodal disease in chest/abdomen/pelvis. New hypermetabolic disease in right vaginal fornix/cervix.  Problem List: Patient Active Problem List   Diagnosis Date Noted  . Goals of care, counseling/discussion 11/01/2019  . Current smoker 09/25/2019  . High risk medication use 09/24/2019  . Other specified personal risk factors, not elsewhere classified 09/24/2019  . DVT of lower limb, acute ([place]) 06/05/2019  . Iron deficiency anemia secondary to blood loss (chronic) 05/24/2019  . Examination of participant in clinical trial 05/12/2019  . Cervical cancer ([place]) 05/04/2019  . Generalized abdominal pain 04/09/2019  . Acute midline low back pain without sciatica 04/09/2019  . Nicotine dependence, cigarettes, uncomplicated 21/19/4174    Past Medical History: Past Medical History:  Diagnosis Date  . Anemia   . Cancer ([place])    cwervical cancer  . Cervical cancer ([place])   . No pertinent past medical history     Past Surgical History: Past Surgical History:  Procedure Laterality Date  . CYSTOSCOPY W/ URETERAL STENT PLACEMENT Right 10/02/2019   Procedure: CYSTOSCOPY WITH RETROGRADE PYELOGRAM/URETERAL STENT PLACEMENT;  Surgeon: Hollice Espy, MD;  Location: [facility] ORS;  Service: Urology;  Laterality: Right;  . IVC FILTER REMOVAL N/A 08/07/2019   Procedure: IVC FILTER REMOVAL;  Surgeon: Katha Cabal, MD;  Location: [place] [facility];  Service: Cardiovascular;  Laterality: N/A;  . MASS EXCISION     Throat  . PERIPHERAL VASCULAR THROMBECTOMY Left 06/05/2019   Procedure: PERIPHERAL VASCULAR THROMBECTOMY WITH IVC FILTER (LEFT LOWER EXTREMITY);  Surgeon: Katha Cabal, MD;  Location: [place] [facility];  Service: Cardiovascular;  Laterality: Left;  . PORTA CATH INSERTION N/A 06/06/2019   Procedure: PORTA CATH INSERTION;  Surgeon: Katha Cabal, MD;  Location: [place] [facility];  Service: Cardiovascular;  Laterality: N/A;  . TONSILLECTOMY      Past Gynecologic History:  History of abnormal Paps: Per HPI Contraception: Sexually active: Not currently Menarche: 8th grade Details: 5 days  History of OCP/HRT use:   OB History:  OB History  Gravida Para Term Preterm AB Living  _0 SAB TAB Ectopic Multiple Live Births               # Outcome Date GA Lbr Len/2nd Weight Sex Delivery Anes PTL Lv  2 AB           1 Para             Family History: Family History  Problem Relation Age of Onset  . Brain cancer Mother 39  . Alcohol abuse Father   . Heart disease Father   . Prostate cancer Father   . Heart attack Father   . Diabetes Father     Social History: Social History   Socioeconomic History  . Marital status: Divorced    Spouse name: Not on file  . Number of children: Not on file  . Years of education: Not on file  . Highest education level: Not on file  Occupational History  . Occupation: [employer]    Employer: [employer]  Tobacco Use  . Smoking status: Former Smoker    Packs/day: 0.50    Years: 30.00    Pack years: 15.00    Types: Cigarettes    Quit date: 07/03/2019    Years since quitting: 0.3  . Smokeless tobacco: Never Used  Substance and Sexual Activity  . Alcohol use: Not Currently  . Drug use: Never  . Sexual activity: Yes    Comment: not at moment  Other Topics Concern  . Not on file  Social History Narrative  . Not on file   Social  Determinants of Health   Financial Resource Strain: Low Risk   . Difficulty of Paying Living Expenses: Not hard at all  Food Insecurity: No Food Insecurity  . Worried About Charity fundraiser in the Last Year: Never true  . Ran Out of Food in the Last Year: Never true  Transportation Needs: No Transportation Needs  . Lack of Transportation (Medical): No  . Lack of Transportation (Non-Medical): No  Physical Activity: Sufficiently Active  . Days of Exercise per Week: 6 days  . Minutes of Exercise per Session: 40 min  Stress: No Stress Concern Present  . Feeling of Stress : Not at all  Social Connections: Moderately Isolated  . Frequency of Communication with Friends and Family: More than three times a week  . Frequency of Social Gatherings with Friends and Family: More than three times a week  . Attends Religious Services: Never  . Active Member of Clubs or Organizations: No  . Attends Archivist Meetings: Never  . Marital Status: Divorced  Human resources officer Violence: Not At Risk  . Fear of Current or Ex-Partner: No  . Emotionally Abused: No  . Physically Abused: No  . Sexually Abused: No    Allergies: No Known Allergies  Current Medications: Current Outpatient Medications  Medication Sig Dispense Refill  . ALPRAZolam (XANAX) 0.25 MG tablet Take 1 tablet (0.25  mg total) by mouth 2 (two) times daily as needed for anxiety. 45 tablet 0  . apixaban (ELIQUIS) 5 MG TABS tablet Take 1 tablet (5 mg total) by mouth 2 (two) times daily. 180 tablet 1  . dexamethasone (DECADRON) 4 MG tablet Take 1 tablet (4 mg total) by mouth daily. 14 tablet 0  . mirtazapine (REMERON) 7.5 MG tablet Take 1 tablet (7.5 mg total) by mouth at bedtime. 30 tablet 2  . ondansetron (ZOFRAN) 8 MG tablet Take 1 tablet (8 mg total) by mouth every 8 (eight) hours as needed for nausea or vomiting. 45 tablet 0  . oxybutynin (DITROPAN-XL) 5 MG 24 hr tablet Take 1 tablet (5 mg total) by mouth at bedtime. TAKE 1  TABLET BY MOUTH EVERYDAY AT BEDTIME 30 tablet 0  . oxyCODONE (OXY IR/ROXICODONE) 5 MG immediate release tablet Take 1-2 tablets (5-10 mg total) by mouth every 4 (four) hours as needed for severe pain. 60 tablet 0  . oxyCODONE ER 27 MG C12A Take 1 capsule by mouth every 12 (twelve) hours. 60 capsule 0  . tamsulosin (FLOMAX) 0.4 MG CAPS capsule TAKE 1 CAPSULE BY MOUTH EVERY DAY 30 capsule 1   No current facility-administered medications for this visit.   Review of Systems Per interval history  Objective:  Physical Examination:  Today's Vitals   11/21/19 1517  BP: 104/80  Pulse: 90  Resp: 18  Temp: 98.5 F (36.9 C)  TempSrc: Tympanic  Weight: 107 lb (48.5 kg)  PainSc: 9    Body mass index is 19.57 kg/m.  BP 104/80 (BP Location: Right Arm, Patient Position: Sitting)   Pulse 90   Temp 98.5 F (36.9 C) (Tympanic)   Resp 18   Wt 107 lb (48.5 kg)   BMI 19.57 kg/m     ECOG Performance Status: 2 - Symptomatic, <50% confined to bed  GENERAL: Patient is chronically ill appearing female in no acute distress HEENT:  PERRL, neck supple with midline trachea. Thyroid without masses.  NODES:  No cervical, supraclavicular, axillary, or inguinal lymphadenopathy palpated.  LUNGS:  Clear to auscultation bilaterally.  No wheezes or rhonchi. HEART:  Regular rate and rhythm. No murmur appreciated. ABDOMEN:  Soft, nontender.  Positive, normoactive bowel sounds.  MSK:  No focal spinal tenderness to palpation. Full range of motion bilaterally in the upper extremities. EXTREMITIES:  No peripheral edema.   SKIN:  Clear with no obvious rashes or skin changes. No nail dyscrasia. NEURO:  Nonfocal. Well oriented.  Appropriate affect.  Pelvic: Exam Chaperoned by NP EGBUS: no lesions Vagina: Dark blood present.  Cervix: Bulky tumor nodularity.  Uterus: Globular 12 week size and prominent anteriorly about 2 FB above the pubic bone.   Bimanual/RV: bulky tumor about 6-8 cm   Lab Review Lab Results   Component Value Date   WBC 5.9 11/20/2019   HGB 10.3 (L) 11/20/2019   HCT 33.9 (L) 11/20/2019   MCV 100.3 (H) 11/20/2019   PLT 358 11/20/2019     Chemistry      Component Value Date/Time   NA 136 11/20/2019 1047   NA 138 04/09/2019 0918   K 3.5 11/20/2019 1047   CL 101 11/20/2019 1047   CO2 25 11/20/2019 1047   BUN 13 11/20/2019 1047   BUN 12 04/09/2019 0918   CREATININE 0.84 11/20/2019 1047      Component Value Date/Time   CALCIUM 9.8 11/20/2019 1047   ALKPHOS 123 11/20/2019 1047   AST 26 11/20/2019 1047  ALT 11 11/20/2019 1047   BILITOT 0.6 11/20/2019 1047   BILITOT 0.4 04/09/2019 0918     Repeat labs ordered and T&C.   Radiologic Imaging: As per HPI and interval history    Assessment:  HARLO JASO is a 55 y.o. 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  There is 2.2 cm mixed lytic and lucent lesion in the left sacrum, indeterminate, but metastatic disease not excluded.  Several 3-4 mm nodules identified in the lungs.  Also has area in the sacrum concerning for metastatic disease.Cervical biopsy only suggestive of microinvasion, but biopsy surely just was not deep enough as the tumor is all submucosal and up in the cervix. CT scan showed extensive bulky adenopathy and she was treated with primary radiation and cisplatin with excellent response overall, but new disease in chest and neck.  Then received 3 cycles of carbo/taxol with progression on CT in 11/20. Now receiving Keytruda s/p 2 cycles.  PD-L1 was found to be positive (3%, CPS is greater than 1).     Now with pain and bleeding due to recurrent growth of cancer in the cervix and parametria.  Pain symptoms due to malignancy, suboptimal control.  Ureteral stent in place on right due to hydronephrosis.   Medical co-morbidities complicating care: [place] Kitchen  Plan:   Problem List Items  Addressed This Visit      Genitourinary   Cervical cancer ([place]) - Primary     She will continue on Keytruda with Med Onc, but prognosis is poor and we discussed this at length.  Seen by palliative care today for symptom management and Rx for oxycontin per Billey Chang, NP for pain control optimization. Had issues with change in insurance and now getting back on a more coordinated regimen.   Spoke to Dr Baruch Gouty and he agrees with a course of palliative pelvic radiation to control bleeding.  Will arrange appointment. We will see her back in 2 months.    The patient's diagnosis, an outline of the further diagnostic and laboratory studies which will be required, the recommendation for surgery, and alternatives were discussed with her and her accompanying family members.  All questions were answered to their satisfaction.  Verlon Au, NP  I personally interviewed and examined the patient. Agreed with the above/below plan of care. I have directly contributed to assessment and plan of care of this patient and educated and discussed with patient and family.  Mellody Drown, MD  CC:  Dr. Georgianne Fick

## 2019-11-21 NOTE — Progress Notes (Signed)
Pt in for vaginal bleeding, states saturating four pads a day.  Reports pain from mid abdomen radiating to spine on right side.  Unable to obtain Oxycodone ER pain med due to cost.  Pt is using Oxycodone 5mg . Rates pain a 9.

## 2019-11-23 NOTE — Progress Notes (Signed)
[place]  [phone] [phone]  ID: MCKYNLIE VANDERSLICE OB: [birth year]  MR#: [id number]  BUL#:[id number]  Patient Care Team: Venita Lick, NP as PCP - General (Nurse Practitioner) Clent Jacks, RN as Oncology Nurse Navigator   CHIEF COMPLAINT: Progressive stage IV cervical cancer  INTERVAL HISTORY: Patient returns to clinic today for further evaluation.  She continues to have significant constipation associated with abdominal/back pain and dehydration.  She is noted to have hypotension and dizziness.  She continues to have significant vaginal bleeding. She has no neurologic complaints.  She denies any recent fevers or illnesses.  She has no chest pain, shortness of breath, cough, or hemoptysis.  She denies any nausea, vomiting, constipation, or diarrhea.  She has no urinary complaints.  Patient feels generally terrible, but offers no further specific complaints today.  REVIEW OF SYSTEMS:   Review of Systems  Constitutional: Positive for malaise/fatigue and weight loss. Negative for fever.  Respiratory: Negative.  Negative for cough and shortness of breath.   Cardiovascular: Negative.  Negative for chest pain and leg swelling.  Gastrointestinal: Positive for abdominal pain and constipation.  Genitourinary: Positive for flank pain. Negative for hematuria.       Vaginal bleeding.  Musculoskeletal: Negative for back pain.  Skin: Negative.  Negative for rash.  Neurological: Positive for weakness. Negative for dizziness, focal weakness and headaches.  Psychiatric/Behavioral: The patient is nervous/anxious and has insomnia.     As per HPI. Otherwise, a complete review of systems is negative.  PAST MEDICAL HISTORY: Past Medical History:  Diagnosis Date  . Anemia   . Cancer ([place])    cwervical cancer  . Cervical cancer ([place])   . No pertinent past medical history     PAST SURGICAL HISTORY: Past Surgical History:  Procedure Laterality Date   . CYSTOSCOPY W/ URETERAL STENT PLACEMENT Right 10/02/2019   Procedure: CYSTOSCOPY WITH RETROGRADE PYELOGRAM/URETERAL STENT PLACEMENT;  Surgeon: Hollice Espy, MD;  Location: [facility];  Service: Urology;  Laterality: Right;  . IVC FILTER REMOVAL N/A 08/07/2019   Procedure: IVC FILTER REMOVAL;  Surgeon: Katha Cabal, MD;  Location: [place] [facility];  Service: Cardiovascular;  Laterality: N/A;  . MASS EXCISION     Throat  . PERIPHERAL VASCULAR THROMBECTOMY Left 06/05/2019   Procedure: PERIPHERAL VASCULAR THROMBECTOMY WITH IVC FILTER (LEFT LOWER EXTREMITY);  Surgeon: Katha Cabal, MD;  Location: [place] [facility];  Service: Cardiovascular;  Laterality: Left;  . PORTA CATH INSERTION N/A 06/06/2019   Procedure: PORTA CATH INSERTION;  Surgeon: Katha Cabal, MD;  Location: [facility];  Service: Cardiovascular;  Laterality: N/A;  . TONSILLECTOMY      FAMILY HISTORY: Family History  Problem Relation Age of Onset  . Brain cancer Mother 14  . Alcohol abuse Father   . Heart disease Father   . Prostate cancer Father   . Heart attack Father   . Diabetes Father     ADVANCED DIRECTIVES (Y/N):  N  HEALTH MAINTENANCE: Social History   Tobacco Use  . Smoking status: Former Smoker    Packs/day: 0.50    Years: 30.00    Pack years: 15.00    Types: Cigarettes    Quit date: 07/03/2019    Years since quitting: 0.4  . Smokeless tobacco: Never Used  Substance Use Topics  . Alcohol use: Not Currently  . Drug use: Never     Colonoscopy:  PAP:  Bone density:  Lipid panel:  No  Known Allergies  Current Outpatient Medications  Medication Sig Dispense Refill  . ALPRAZolam (XANAX) 0.25 MG tablet Take 1 tablet (0.25 mg total) by mouth 2 (two) times daily as needed for anxiety. 45 tablet 0  . apixaban (ELIQUIS) 5 MG TABS tablet Take 1 tablet (5 mg total) by mouth 2 (two) times daily. 180 tablet 1  . dexamethasone (DECADRON) 4 MG tablet Take 1 tablet (4 mg total) by  mouth daily. (Patient not taking: Reported on 11/21/2019) 14 tablet 0  . lactulose (CHRONULAC) 10 GM/15ML solution Take 15-30 mLs (10-20 g total) by mouth daily as needed (constipation). 236 mL 0  . mirtazapine (REMERON) 7.5 MG tablet Take 1 tablet (7.5 mg total) by mouth at bedtime. (Patient not taking: Reported on 11/21/2019) 30 tablet 2  . morphine (MS CONTIN) 15 MG 12 hr tablet Take 1 tablet (15 mg total) by mouth every 8 (eight) hours. 45 tablet 0  . ondansetron (ZOFRAN) 8 MG tablet Take 1 tablet (8 mg total) by mouth every 8 (eight) hours as needed for nausea or vomiting. 45 tablet 0  . oxybutynin (DITROPAN-XL) 5 MG 24 hr tablet Take 1 tablet (5 mg total) by mouth at bedtime. TAKE 1 TABLET BY MOUTH EVERYDAY AT BEDTIME 30 tablet 0  . oxyCODONE (OXY IR/ROXICODONE) 5 MG immediate release tablet Take 1-2 tablets (5-10 mg total) by mouth every 4 (four) hours as needed for severe pain. 60 tablet 0  . oxyCODONE ER 27 MG C12A Take 1 capsule by mouth every 12 (twelve) hours. (Patient not taking: Reported on 11/21/2019) 60 capsule 0  . tamsulosin (FLOMAX) 0.4 MG CAPS capsule TAKE 1 CAPSULE BY MOUTH EVERY DAY 30 capsule 1   No current facility-administered medications for this visit.   Facility-Administered Medications Ordered in Other Visits  Medication Dose Route Frequency Provider Last Rate Last Admin  . heparin lock flush 100 unit/mL  500 Units Intravenous Once Lloyd Huger, MD      . sodium chloride flush (NS) 0.9 % injection 10 mL  10 mL Intravenous PRN Lloyd Huger, MD        OBJECTIVE: Vitals:   11/27/19 1009  BP: (!) 79/58  Pulse: (!) 107  Resp: 17  Temp: (!) 97.5 F (36.4 C)  SpO2: 99%     Body mass index is 19.61 kg/m.    ECOG FS:0 - Asymptomatic  General: Thin, mild discomfort secondary to pain. Eyes: Pink conjunctiva, anicteric sclera. HEENT: Normocephalic, moist mucous membranes. Lungs: No audible wheezing or coughing. Heart: Regular rate and rhythm. Abdomen:  Soft, nontender, no obvious distention. Musculoskeletal: No edema, cyanosis, or clubbing. Neuro: Alert, answering all questions appropriately. Cranial nerves grossly intact. Skin: No rashes or petechiae noted. Psych: Normal affect.   LAB RESULTS:  Lab Results  Component Value Date   NA 137 11/27/2019   K 3.3 (L) 11/27/2019   CL 99 11/27/2019   CO2 28 11/27/2019   GLUCOSE 118 (H) 11/27/2019   BUN 19 11/27/2019   CREATININE 0.91 11/27/2019   CALCIUM 10.1 11/27/2019   PROT 7.0 11/27/2019   ALBUMIN 3.6 11/27/2019   AST 23 11/27/2019   ALT 10 11/27/2019   ALKPHOS 98 11/27/2019   BILITOT 0.5 11/27/2019   GFRNONAA >60 11/27/2019   GFRAA >60 11/27/2019    Lab Results  Component Value Date   WBC 7.6 11/27/2019   NEUTROABS 5.5 11/27/2019   HGB 10.2 (L) 11/27/2019   HCT 33.3 (L) 11/27/2019   MCV 97.7 11/27/2019  PLT 347 11/27/2019     STUDIES: CT ABDOMEN PELVIS W CONTRAST  Result Date: 10/29/2019 CLINICAL DATA:  54 year old female with history of cervical cancer. History of right ureteral stent placed on 10/02/2019. Abdominal pain since that time. EXAM: CT ABDOMEN AND PELVIS WITH CONTRAST TECHNIQUE: Multidetector CT imaging of the abdomen and pelvis was performed using the standard protocol following bolus administration of intravenous contrast. CONTRAST:  123m OMNIPAQUE IOHEXOL 300 MG/ML  SOLN COMPARISON:  CT the chest, abdomen and pelvis 05/01/2019. FINDINGS: Lower chest: Mild scarring or atelectasis in the lung bases bilaterally. Hepatobiliary: The posterior aspect of the right lobe of the liver involving portions of both segments 6 and 7 there is a 3.5 x 1.4 cm peripheral hypovascular lesion which is favored to represent a capsular implant. Similarly, there is a smaller capsular lesion overlying segment 6 (axial image 22 of series 2). No intra or extrahepatic biliary ductal dilatation. Small non dependent 5 mm lesion in the gallbladder, favored to represent a gallbladder polyp.  Pancreas: No pancreatic mass. No pancreatic ductal dilatation. No pancreatic or peripancreatic fluid collections or inflammatory changes. Spleen: Unremarkable. Adrenals/Urinary Tract: In the upper pole of the right kidney there is a 9 mm low-attenuation lesion, in both kidneys there are subcentimeter low-attenuation lesions, too small to characterize, but favored to represent cysts. Right-sided double-J ureteral stent which appears properly located. Mild right hydroureteronephrosis despite the presence of a stent. No left-sided hydroureteronephrosis. Urinary bladder is normal in appearance. Stomach/Bowel: Normal appearance of the stomach. No pathologic dilatation of small bowel or colon. The appendix is not confidently identified and may be surgically absent. Regardless, there are no inflammatory changes noted adjacent to the cecum to suggest the presence of an acute appendicitis at this time. Vascular/Lymphatic: Aortic atherosclerosis, without evidence of aneurysm or dissection in the abdominal or pelvic vasculature. Left common iliac vein stent which appears patent. Abnormal soft tissue in the cul-de-sac immediately anterior to the distal sigmoid colon and/or proximal rectum best appreciated on axial image 63 of series 2 and coronal image 85 of series 4 measuring 2.4 x 1.6 x 2.6 cm, and on axial image 62 of series 2 and coronal image 85 of series 4 measuring 1.7 x 1.2 x 2.6 cm, compatible with lymph nodes and/or metastatic intraperitoneal implants. Likewise, some abnormal soft tissue is noted along the right pelvic sidewall adjacent to the distal third of the right ureter measuring approximately 2.8 x 2.1 x 3.3 cm (axial image 55 of series 2 and coronal image 71 of series 4), likely an enlarged lymph node. These all appear increased compared to the prior study. Other lymph nodes appear smaller than the prior examination, including a right obturator lymph node (axial image 63 of series 2) which currently measures  only 9 mm in short axis (previously 2.2 cm) additionally, previously noted bulky retroperitoneal lymphadenopathy has largely resolved, now with some amorphous soft tissue in the retroperitoneum encasing the abdominal aorta best appreciated on axial image 27 of series 2. The largest single retroperitoneal lymph node identified on today's examination measures 1 cm in short axis in the left para-aortic nodal station (axial image 25 of series 2). Reproductive: The uterus has further enlarged and is more heterogeneous in appearance than the prior examination, currently measuring approximately 15.0 x 6.4 x 8.0 cm. Diffuse infiltration of the uterus with multiple hypovascular masses, which also demonstrate extensive involvement of the lower uterine segment and cervical region, particularly anteriorly. There is a tiny locule of gas in the vaginal  apex, likely related to recent pelvic examination. Other: Trace volume of ascites, slightly increased compared to the prior examination. No pneumoperitoneum. Musculoskeletal: Tiny sclerotic lesion in the right parasymphyseal region and mixed lucent and sclerotic lesion in the left sacrum and adjacent left ilium all appear similar to the prior examination, and were not hypermetabolic on prior PET-CT, suggesting benign lesions. There are no other aggressive appearing lytic or blastic lesions noted in the visualized portions of the skeleton. IMPRESSION: 1. Today's study demonstrates a mixed response to therapy with further enlargement of the lesions throughout the cervix and uterus, some increasing lymphadenopathy along the right pelvic sidewall adjacent to the distal third of the right ureter, enlarging lymph nodes and/or metastatic deposits in the cul-de-sac, and new capsular implants associated with the right lobe of the liver with slight increase in trace volume of ascites. Other bulky lymphadenopathy in the pelvis and retroperitoneum has regressed compared to the prior  examination. 2. Right-sided double-J ureteral stent appears appropriately located. Despite the presence of the stent there is persistent mild right hydroureteronephrosis. 3. Aortic atherosclerosis. 4. Additional incidental findings, as above. Electronically Signed   By: Vinnie Langton M.D.   On: 10/29/2019 16:27    ASSESSMENT: Progressive stage IV cervical cancer.  PLAN:    1.  Progressive stage IV cervical cancer: Biopsy results from April 24, 2019 confirmed the diagnosis. PET scan on September 17, 2019 revealed progressive disease.  Initial plan was to enroll in clinical trial at [facility], but secondary to significant urine protein patient did not qualify.  PD-L1 was found to be positive (3%, CPS is greater than 1).  Patient received cycle 2 of palliative Keytruda 1 week ago.  She has been instructed to keep her previously scheduled follow-up appointment in 2 weeks for consideration of cycle 3.   2.  Hydronephrosis: Imaging as above.  Ureteral stent appears to be appropriately in place. 3.  Venous access: Patient now has had a port placed. 4.  Renal insufficiency: Resolved.   5.  Anemia: Despite heavy vaginal bleeding, patient's hemoglobin is stable at 10.2.  Although given her hypotension and dehydration there may be some hemoconcentration. 6.  Abdominal/flank pain: Possibly secondary to progressive disease, although given her significant constipation this may be contributing.  Continue current narcotics.  Patient palliative care input. 7.  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  She will require Eliquis for minimum 6 months, but she will likely require extended treatment.  Patient's IVC filter has been removed.  8.  Thrombocytopenia: Resolved. 9.  Anxiety: Continue Xanax as prescribed. 10.  Poor appetite/insomnia: Patient was instructed to take Remeron as prescribed.   11.  Vaginal bleeding: Difficult situation given patient is on Eliquis for her pulmonary embolism.   Appreciate consult input.  Patient had consultation with radiation oncology today. 12.  Constipation: Patient has been instructed to increase lactulose to twice a day.  Patient expressed understanding and was in agreement with this plan. She also understands that She can call clinic at any time with any questions, concerns, or complaints.   Cancer Staging Cervical cancer [facility]) Staging form: Cervix Uteri, AJCC 8th Edition - Clinical stage from 11/17/2019: FIGO Stage IVB (cT3b, cN1, pM1) - Signed by Lloyd Huger, MD on 11/17/2019   Lloyd Huger, MD   11/27/2019 12:36 PM

## 2019-11-26 ENCOUNTER — Other Ambulatory Visit: Payer: Self-pay | Admitting: Hospice and Palliative Medicine

## 2019-11-26 ENCOUNTER — Ambulatory Visit (INDEPENDENT_AMBULATORY_CARE_PROVIDER_SITE_OTHER): Payer: BC Managed Care – PPO | Admitting: Nurse Practitioner

## 2019-11-26 ENCOUNTER — Encounter: Payer: Self-pay | Admitting: Oncology

## 2019-11-26 ENCOUNTER — Encounter (INDEPENDENT_AMBULATORY_CARE_PROVIDER_SITE_OTHER): Payer: Self-pay

## 2019-11-26 MED ORDER — LACTULOSE 10 GM/15ML PO SOLN: 10.0000 g | Freq: Every day | ORAL | 0 refills | Status: AC | PRN

## 2019-11-26 NOTE — Progress Notes (Signed)
I called and spoke with patient. She is having persistent constipation. Likely opioid induced. LBM 4 days ago. No n/v or abd distension. No bloating. Passing gas. Recent CT without evidence of obstruction.   Patient currently taking Senna 2 tabs BID, MiraLAX and dulcolax PR.   Will start lactulose prn. Could also try enemas prn.   Would generally recommend trial of Movantik if that fails but unclear if her insurance will cover.   Case discussed with Dr. Grayland Ormond.

## 2019-11-27 ENCOUNTER — Inpatient Hospital Stay (HOSPITAL_BASED_OUTPATIENT_CLINIC_OR_DEPARTMENT_OTHER): Payer: BC Managed Care – PPO | Admitting: Oncology

## 2019-11-27 ENCOUNTER — Inpatient Hospital Stay: Payer: BC Managed Care – PPO

## 2019-11-27 ENCOUNTER — Encounter: Payer: Self-pay | Admitting: Oncology

## 2019-11-27 ENCOUNTER — Other Ambulatory Visit: Payer: Self-pay

## 2019-11-27 ENCOUNTER — Ambulatory Visit
Admission: RE | Admit: 2019-11-27 | Discharge: 2019-11-27 | Disposition: A | Discharge status: Home / Self Care | Payer: BC Managed Care – PPO | Source: Ambulatory Visit | Attending: Radiation Oncology | Admitting: Radiation Oncology

## 2019-11-27 VITALS — BP 79/58 | HR 107 | Temp 97.5°F | Resp 17 | Wt 107.2 lb

## 2019-11-27 DIAGNOSIS — K5900 Constipation, unspecified: Secondary | ICD-10-CM | POA: Diagnosis not present

## 2019-11-27 DIAGNOSIS — G4700 Insomnia, unspecified: Secondary | ICD-10-CM | POA: Diagnosis not present

## 2019-11-27 DIAGNOSIS — Z515 Encounter for palliative care: Secondary | ICD-10-CM | POA: Diagnosis not present

## 2019-11-27 DIAGNOSIS — D500 Iron deficiency anemia secondary to blood loss (chronic): Secondary | ICD-10-CM | POA: Diagnosis not present

## 2019-11-27 DIAGNOSIS — C539 Malignant neoplasm of cervix uteri, unspecified: Secondary | ICD-10-CM

## 2019-11-27 DIAGNOSIS — R140 Abdominal distension (gaseous): Secondary | ICD-10-CM | POA: Diagnosis not present

## 2019-11-27 DIAGNOSIS — C538 Malignant neoplasm of overlapping sites of cervix uteri: Secondary | ICD-10-CM

## 2019-11-27 DIAGNOSIS — E860 Dehydration: Secondary | ICD-10-CM | POA: Diagnosis not present

## 2019-11-27 DIAGNOSIS — C786 Secondary malignant neoplasm of retroperitoneum and peritoneum: Secondary | ICD-10-CM | POA: Diagnosis not present

## 2019-11-27 DIAGNOSIS — N938 Other specified abnormal uterine and vaginal bleeding: Secondary | ICD-10-CM | POA: Diagnosis not present

## 2019-11-27 DIAGNOSIS — F419 Anxiety disorder, unspecified: Secondary | ICD-10-CM | POA: Diagnosis not present

## 2019-11-27 DIAGNOSIS — C530 Malignant neoplasm of endocervix: Secondary | ICD-10-CM | POA: Diagnosis not present

## 2019-11-27 DIAGNOSIS — Z79899 Other long term (current) drug therapy: Secondary | ICD-10-CM | POA: Diagnosis not present

## 2019-11-27 DIAGNOSIS — I959 Hypotension, unspecified: Secondary | ICD-10-CM

## 2019-11-27 DIAGNOSIS — R630 Anorexia: Secondary | ICD-10-CM | POA: Diagnosis not present

## 2019-11-27 DIAGNOSIS — Z95828 Presence of other vascular implants and grafts: Secondary | ICD-10-CM

## 2019-11-27 DIAGNOSIS — Z5112 Encounter for antineoplastic immunotherapy: Secondary | ICD-10-CM | POA: Diagnosis not present

## 2019-11-27 DIAGNOSIS — C778 Secondary and unspecified malignant neoplasm of lymph nodes of multiple regions: Secondary | ICD-10-CM | POA: Diagnosis not present

## 2019-11-27 DIAGNOSIS — G893 Neoplasm related pain (acute) (chronic): Secondary | ICD-10-CM | POA: Diagnosis not present

## 2019-11-27 DIAGNOSIS — C7911 Secondary malignant neoplasm of bladder: Secondary | ICD-10-CM | POA: Diagnosis not present

## 2019-11-27 LAB — CBC WITH DIFFERENTIAL/PLATELET
Abs Immature Granulocytes: 0.02 10*3/uL (ref 0.00–0.07)
Basophils Absolute: 0 10*3/uL (ref 0.0–0.1)
Basophils Relative: 0 %
Eosinophils Absolute: 0.1 10*3/uL (ref 0.0–0.5)
Eosinophils Relative: 2 %
HCT: 33.3 % — ABNORMAL LOW (ref 36.0–46.0)
Hemoglobin: 10.2 g/dL — ABNORMAL LOW (ref 12.0–15.0)
Immature Granulocytes: 0 %
Lymphocytes Relative: 14 %
Lymphs Abs: 1.1 10*3/uL (ref 0.7–4.0)
MCH: 29.9 pg (ref 26.0–34.0)
MCHC: 30.6 g/dL (ref 30.0–36.0)
MCV: 97.7 fL (ref 80.0–100.0)
Monocytes Absolute: 0.9 10*3/uL (ref 0.1–1.0)
Monocytes Relative: 12 %
Neutro Abs: 5.5 10*3/uL (ref 1.7–7.7)
Neutrophils Relative %: 72 %
Platelets: 347 10*3/uL (ref 150–400)
RBC: 3.41 MIL/uL — ABNORMAL LOW (ref 3.87–5.11)
RDW: 17.8 % — ABNORMAL HIGH (ref 11.5–15.5)
WBC: 7.6 10*3/uL (ref 4.0–10.5)
nRBC: 0 % (ref 0.0–0.2)

## 2019-11-27 LAB — COMPREHENSIVE METABOLIC PANEL
ALT: 10 U/L (ref 0–44)
AST: 23 U/L (ref 15–41)
Albumin: 3.6 g/dL (ref 3.5–5.0)
Alkaline Phosphatase: 98 U/L (ref 38–126)
Anion gap: 10 (ref 5–15)
BUN: 19 mg/dL (ref 6–20)
CO2: 28 mmol/L (ref 22–32)
Calcium: 10.1 mg/dL (ref 8.9–10.3)
Chloride: 99 mmol/L (ref 98–111)
Creatinine, Ser: 0.91 mg/dL (ref 0.44–1.00)
GFR calc Af Amer: >60 mL/min (ref >60)
GFR calc non Af Amer: >60 mL/min (ref >60)
Glucose, Bld: 118 mg/dL — ABNORMAL HIGH (ref 70–99)
Potassium: 3.3 mmol/L — ABNORMAL LOW (ref 3.5–5.1)
Sodium: 137 mmol/L (ref 135–145)
Total Bilirubin: 0.5 mg/dL (ref 0.3–1.2)
Total Protein: 7 g/dL (ref 6.5–8.1)

## 2019-11-27 MED ORDER — MORPHINE SULFATE (PF) 2 MG/ML IV SOLN
2.0000 mg | Freq: Once | INTRAVENOUS | Status: AC
  Administered 2019-11-27: 2 mg via INTRAVENOUS

## 2019-11-27 MED ORDER — MORPHINE SULFATE (PF) 2 MG/ML IV SOLN
INTRAVENOUS | Status: AC
  Filled 2019-11-27: qty 1

## 2019-11-27 MED ORDER — HEPARIN SOD (PORK) LOCK FLUSH 100 UNIT/ML IV SOLN
500.0000 [IU] | Freq: Once | INTRAVENOUS | Status: AC
  Administered 2019-11-27: 500 [IU] via INTRAVENOUS
  Filled 2019-11-27: qty 5

## 2019-11-27 MED ORDER — MORPHINE SULFATE (PF) 2 MG/ML IV SOLN
2.0000 mg | Freq: Once | INTRAVENOUS | Status: AC
  Administered 2019-11-27: 2 mg via INTRAVENOUS
  Filled 2019-11-27: qty 1

## 2019-11-27 MED ORDER — SODIUM CHLORIDE 0.9% FLUSH
10.0000 mL | INTRAVENOUS | Status: AC | PRN
  Administered 2019-11-27: 10 mL via INTRAVENOUS
  Filled 2019-11-27: qty 10

## 2019-11-27 MED ORDER — SODIUM CHLORIDE 0.9 % IV SOLN
Freq: Once | INTRAVENOUS | Status: AC
  Filled 2019-11-27: qty 250

## 2019-11-27 NOTE — Progress Notes (Signed)
Pt here for follow up. Reports no BM since last Wednesday or Thursday, despite OTC meds and lactulose last night. Endorses pelvis pressure, denies pain. Looks uncomfortable. Pt reports continued heavy vaginal bleeding. Tachycardic and hypotensive in clinic.

## 2019-11-27 NOTE — Progress Notes (Signed)
Radiation Oncology Follow up Note  Name: Mariah Mcmahon   Date:   11/27/2019 MRN:  [id number] DOB: [birth year]    This 54 y.o. 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  She is now dealing with stage IV.  Disease currently on palliative Keytruda.  She has now progressed with increasing vaginal bleeding over the past week and has significant pain in her abdominal pelvic region.  PET CT scan back in November showed progression of hypermetabolic nodal disease within the chest abdomen pelvis.  Also evidence of hypermetabolic activity in the vaginal fornix compatible with disease progression.  CT scan back in December showed mixed response to therapy with enlargement of lesions throughout the cervix and uterus with increasing lymphadenopathy along the right pelvic sidewall adjacent to the distal third of the right ureter.  She is seen today and receiving fluids seen for consideration of palliative radiation to prevent further bleeding and abdominal pelvic pain.  COMPLICATIONS OF TREATMENT: none  FOLLOW UP COMPLIANCE: keeps appointments   PHYSICAL EXAM:  There were no vitals taken for this visit. Well-developed well-nourished patient in NAD. HEENT reveals PERLA, EOMI, discs not visualized.  Oral cavity is clear. No oral mucosal lesions are identified. Neck is clear without evidence of cervical or supraclavicular adenopathy. Lungs are clear to A&P. Cardiac examination is essentially unremarkable with regular rate and rhythm without murmur rub or thrill. Abdomen is benign with no organomegaly or masses noted. Motor  sensory and DTR levels are equal and symmetric in the upper and lower extremities. Cranial nerves II through XII are grossly intact. Proprioception is intact. No peripheral adenopathy or edema is identified. No motor or sensory levels are noted. Crude visual fields are within normal range.  RADIOLOGY RESULTS: PET/CT and CT scans reviewed compatible with above-stated findings  PLAN: At this time like to go ahead with another 1980 cGy in 11 fractions encompassing all areas of seen disease in her pelvic abdominal region.  I believe we can reevaluate after hypofractionated course of radiation therapy for benefits as far as bleeding and pelvic pain is concerned.  Risks and benefits of treatment including reirradiating small bowel and colon as well as bladder were discussed with the patient.  Increase in lower urinary tract symptoms and diarrhea are possible.  Patient is having some significant problems now with constipation.  Patient comprehends my treatment plan well.  I personally set up and ordered CT simulation for later this week.  I would like to take this opportunity to thank you for allowing me to participate in the care of your patient.Noreene Filbert, MD

## 2019-11-28 ENCOUNTER — Other Ambulatory Visit: Payer: Self-pay | Admitting: *Deleted

## 2019-11-28 ENCOUNTER — Other Ambulatory Visit: Payer: Self-pay | Admitting: Oncology

## 2019-11-28 ENCOUNTER — Inpatient Hospital Stay (HOSPITAL_BASED_OUTPATIENT_CLINIC_OR_DEPARTMENT_OTHER): Payer: BC Managed Care – PPO | Admitting: Hospice and Palliative Medicine

## 2019-11-28 DIAGNOSIS — Z515 Encounter for palliative care: Secondary | ICD-10-CM

## 2019-11-28 DIAGNOSIS — C539 Malignant neoplasm of cervix uteri, unspecified: Secondary | ICD-10-CM | POA: Diagnosis not present

## 2019-11-28 DIAGNOSIS — K5903 Drug induced constipation: Secondary | ICD-10-CM | POA: Diagnosis not present

## 2019-11-28 DIAGNOSIS — G893 Neoplasm related pain (acute) (chronic): Secondary | ICD-10-CM | POA: Diagnosis not present

## 2019-11-28 DIAGNOSIS — E860 Dehydration: Secondary | ICD-10-CM

## 2019-11-28 LAB — THYROID PANEL WITH TSH
Free Thyroxine Index: 2.6 (ref 1.2–4.9)
T3 Uptake Ratio: 29 % (ref 24–39)
T4, Total: 8.8 ug/dL (ref 4.5–12.0)
TSH: 2.69 u[IU]/mL (ref 0.450–4.500)

## 2019-11-28 MED ORDER — OXYCODONE HCL 5 MG PO TABS: 5.0000 mg | ORAL_TABLET | ORAL | 0 refills | Status: DC | PRN

## 2019-11-28 NOTE — Telephone Encounter (Signed)
Please see refill request.

## 2019-11-28 NOTE — Telephone Encounter (Signed)
Dr. Finnegan pt ° °

## 2019-11-28 NOTE — Progress Notes (Signed)
Virtual Visit via Telephone Note  I connected with Mariah Mcmahon on 11/28/19 at 11:00 AM EST by telephone and verified that I am speaking with the correct person using two identifiers.   I discussed the limitations, risks, security and privacy concerns of performing an evaluation and management service by telephone and the availability of in person appointments. I also discussed with the patient that there may be a patient responsible charge related to this service. The patient expressed understanding and agreed to proceed.   History of Present Illness: [name] is a 54 y.o. 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  Patient was felt not to be a surgical candidate and treatment was initiated with concurrent chemoradiation.  Patient was hospitalized 06/05/2019 -06/07/2019 with acute left lower extremity DVT/pulmonary embolism status post thrombolysis and IVC filter placement.  IVC filter was later removed.  Patient had disease progression on carbotaxol and was referred to [facility] for a clinical trial.  PET on 09/17/2019, which revealed interval progression of hypermetabolic nodal disease in chest, abdomen, and pelvis with new hypermetabolic disease in the region of the right vaginal fornix/cervix. Decision was made to proceed with systemic chemotherapy. CT scan on 12/21 revealed mixed response to treatment. Patient was rotated to [facility].  She has had vaginal bleeding and is pending initiation of RT.  Patient was referred to palliative care to help address goals and manage ongoing symptoms.   Observations/Objective: Spoke with patient by phone. Lactulose dose, which was started earlier this week was increased yesterday by Dr. Grayland Ormond to twice daily.  Patient says she is still yet to have a bowel movement.  She is having crampy abdominal pain with flatus but no distention, nausea or vomiting.  Patient required  IV fluids yesterday due to dehydration.  Patient reports that she still not drinking much.  Assessment and Plan: Stage IV cervical cancer -on current treatment with Keytruda.  Patient is pending RT for vaginal bleeding.  Neoplasm related pain -continue MS Contin with oxycodone as needed for breakthrough pain  Constipation -likely opioid induced.  Increase lactulose to 30 mL twice daily.  May continue senna, MiraLAX, and Dulcolax suppositories.  Consider enema.  Will consider trial of Movantik if current bowel regimen fails.  We will plan to bring patient into [facility] tomorrow for eval, labs, and possible IV fluids.   Follow Up Instructions: Follow-up visit on 2/2   I discussed the assessment and treatment plan with the patient. The patient was provided an opportunity to ask questions and all were answered. The patient agreed with the plan and demonstrated an understanding of the instructions.   The patient was advised to call back or seek an in-person evaluation if the symptoms worsen or if the condition fails to improve as anticipated.  I provided 12 minutes of non-face-to-face time during this encounter.   Irean Hong, NP

## 2019-11-29 ENCOUNTER — Inpatient Hospital Stay (HOSPITAL_BASED_OUTPATIENT_CLINIC_OR_DEPARTMENT_OTHER): Payer: BC Managed Care – PPO | Admitting: Oncology

## 2019-11-29 ENCOUNTER — Ambulatory Visit
Admission: RE | Admit: 2019-11-29 | Discharge: 2019-11-29 | Disposition: A | Discharge status: Home / Self Care | Payer: BC Managed Care – PPO | Source: Ambulatory Visit | Attending: Radiation Oncology | Admitting: Radiation Oncology

## 2019-11-29 ENCOUNTER — Inpatient Hospital Stay: Payer: BC Managed Care – PPO

## 2019-11-29 ENCOUNTER — Other Ambulatory Visit: Payer: Self-pay

## 2019-11-29 VITALS — BP 86/63 | HR 118 | Temp 99.0°F | Resp 18

## 2019-11-29 DIAGNOSIS — K5903 Drug induced constipation: Secondary | ICD-10-CM

## 2019-11-29 DIAGNOSIS — T402X5A Adverse effect of other opioids, initial encounter: Secondary | ICD-10-CM

## 2019-11-29 DIAGNOSIS — Z5112 Encounter for antineoplastic immunotherapy: Secondary | ICD-10-CM | POA: Diagnosis not present

## 2019-11-29 DIAGNOSIS — E860 Dehydration: Secondary | ICD-10-CM

## 2019-11-29 DIAGNOSIS — G4700 Insomnia, unspecified: Secondary | ICD-10-CM | POA: Diagnosis not present

## 2019-11-29 DIAGNOSIS — C538 Malignant neoplasm of overlapping sites of cervix uteri: Secondary | ICD-10-CM

## 2019-11-29 DIAGNOSIS — R140 Abdominal distension (gaseous): Secondary | ICD-10-CM | POA: Diagnosis not present

## 2019-11-29 DIAGNOSIS — Z510 Encounter for antineoplastic radiation therapy: Secondary | ICD-10-CM | POA: Insufficient documentation

## 2019-11-29 DIAGNOSIS — Z95828 Presence of other vascular implants and grafts: Secondary | ICD-10-CM

## 2019-11-29 DIAGNOSIS — G893 Neoplasm related pain (acute) (chronic): Secondary | ICD-10-CM | POA: Diagnosis not present

## 2019-11-29 DIAGNOSIS — K5900 Constipation, unspecified: Secondary | ICD-10-CM | POA: Diagnosis not present

## 2019-11-29 DIAGNOSIS — F419 Anxiety disorder, unspecified: Secondary | ICD-10-CM | POA: Diagnosis not present

## 2019-11-29 DIAGNOSIS — C786 Secondary malignant neoplasm of retroperitoneum and peritoneum: Secondary | ICD-10-CM | POA: Diagnosis not present

## 2019-11-29 DIAGNOSIS — N938 Other specified abnormal uterine and vaginal bleeding: Secondary | ICD-10-CM | POA: Diagnosis not present

## 2019-11-29 DIAGNOSIS — C539 Malignant neoplasm of cervix uteri, unspecified: Secondary | ICD-10-CM | POA: Diagnosis not present

## 2019-11-29 DIAGNOSIS — R630 Anorexia: Secondary | ICD-10-CM | POA: Diagnosis not present

## 2019-11-29 DIAGNOSIS — Z79899 Other long term (current) drug therapy: Secondary | ICD-10-CM | POA: Diagnosis not present

## 2019-11-29 DIAGNOSIS — D500 Iron deficiency anemia secondary to blood loss (chronic): Secondary | ICD-10-CM | POA: Diagnosis not present

## 2019-11-29 DIAGNOSIS — C7911 Secondary malignant neoplasm of bladder: Secondary | ICD-10-CM | POA: Diagnosis not present

## 2019-11-29 DIAGNOSIS — Z515 Encounter for palliative care: Secondary | ICD-10-CM | POA: Diagnosis not present

## 2019-11-29 DIAGNOSIS — C778 Secondary and unspecified malignant neoplasm of lymph nodes of multiple regions: Secondary | ICD-10-CM | POA: Diagnosis not present

## 2019-11-29 DIAGNOSIS — C530 Malignant neoplasm of endocervix: Secondary | ICD-10-CM | POA: Diagnosis not present

## 2019-11-29 LAB — BASIC METABOLIC PANEL
Anion gap: 10 (ref 5–15)
BUN: 15 mg/dL (ref 6–20)
CO2: 26 mmol/L (ref 22–32)
Calcium: 9.8 mg/dL (ref 8.9–10.3)
Chloride: 98 mmol/L (ref 98–111)
Creatinine, Ser: 0.91 mg/dL (ref 0.44–1.00)
GFR calc Af Amer: >60 mL/min (ref >60)
GFR calc non Af Amer: >60 mL/min (ref >60)
Glucose, Bld: 116 mg/dL — ABNORMAL HIGH (ref 70–99)
Potassium: 3.4 mmol/L — ABNORMAL LOW (ref 3.5–5.1)
Sodium: 134 mmol/L — ABNORMAL LOW (ref 135–145)

## 2019-11-29 LAB — CBC
HCT: 32.9 % — ABNORMAL LOW (ref 36.0–46.0)
Hemoglobin: 10.3 g/dL — ABNORMAL LOW (ref 12.0–15.0)
MCH: 30 pg (ref 26.0–34.0)
MCHC: 31.3 g/dL (ref 30.0–36.0)
MCV: 95.9 fL (ref 80.0–100.0)
Platelets: 321 10*3/uL (ref 150–400)
RBC: 3.43 MIL/uL — ABNORMAL LOW (ref 3.87–5.11)
RDW: 17.9 % — ABNORMAL HIGH (ref 11.5–15.5)
WBC: 8.7 10*3/uL (ref 4.0–10.5)
nRBC: 0 % (ref 0.0–0.2)

## 2019-11-29 MED ORDER — HEPARIN SOD (PORK) LOCK FLUSH 100 UNIT/ML IV SOLN
500.0000 [IU] | Freq: Once | INTRAVENOUS | Status: AC
  Administered 2019-11-29: 500 [IU] via INTRAVENOUS
  Filled 2019-11-29: qty 5

## 2019-11-29 MED ORDER — SODIUM CHLORIDE 0.9% FLUSH
10.0000 mL | Freq: Once | INTRAVENOUS | Status: AC
  Administered 2019-11-29: 10 mL via INTRAVENOUS
  Filled 2019-11-29: qty 10

## 2019-11-29 MED ORDER — SODIUM CHLORIDE 0.9 % IV SOLN
Freq: Once | INTRAVENOUS | Status: AC
  Filled 2019-11-29: qty 250

## 2019-11-29 MED ORDER — FLEET ENEMA 7-19 GM/118ML RE ENEM: 1.0000 | ENEMA | Freq: Every day | RECTAL | 0 refills | Status: AC | PRN

## 2019-11-29 NOTE — Progress Notes (Signed)
Symptom Management Consult note [facility]  [phone] [phone]  Patient Care Team: Venita Lick, NP as PCP - General (Nurse Practitioner) Clent Jacks, RN as Oncology Nurse Navigator   Name of the patient: Mariah Mcmahon  [id number]  02/24/1966   Date of visit: 11/29/2019   Diagnosis-  Stage IV cervical cancer   Chief complaint/ Reason for visit- Constipation  Heme/Onc history:  Oncology History  Cervical cancer ([place])  05/04/2019 Initial Diagnosis   Cervical cancer ([place])   05/17/2019 - 07/04/2019 Chemotherapy   The patient had palonosetron (ALOXI) injection 0.25 mg, 0.25 mg, Intravenous,  Once, 6 of 7 cycles Administration: 0.25 mg (05/17/2019), 0.25 mg (05/24/2019), 0.25 mg (05/31/2019), 0.25 mg (06/14/2019), 0.25 mg (06/22/2019), 0.25 mg (06/28/2019) CISplatin (PLATINOL) 61 mg in sodium chloride 0.9 % 250 mL chemo infusion, 40 mg/m2 = 61 mg, Intravenous,  Once, 6 of 7 cycles Administration: 61 mg (05/17/2019), 61 mg (05/24/2019), 61 mg (05/31/2019), 61 mg (06/14/2019), 61 mg (06/22/2019), 61 mg (06/28/2019) fosaprepitant (EMEND) 150 mg, dexamethasone (DECADRON) 12 mg in sodium chloride 0.9 % 145 mL IVPB, , Intravenous,  Once, 6 of 7 cycles Administration:  (05/17/2019),  (05/24/2019),  (05/31/2019),  (06/14/2019),  (06/22/2019),  (06/28/2019)  for chemotherapy treatment.    07/20/2019 - 09/20/2019 Chemotherapy   The patient had palonosetron (ALOXI) injection 0.25 mg, 0.25 mg, Intravenous,  Once, 3 of 6 cycles Administration: 0.25 mg (07/20/2019), 0.25 mg (08/10/2019), 0.25 mg (08/31/2019) pegfilgrastim (NEULASTA ONPRO KIT) injection 6 mg, 6 mg, Subcutaneous, Once, 2 of 5 cycles Administration: 6 mg (07/20/2019), 6 mg (08/31/2019) CARBOplatin (PARAPLATIN) 520 mg in sodium chloride 0.9 % 250 mL chemo infusion, 520 mg (95.1 % of original dose 541.8 mg), Intravenous,  Once, 3 of 6 cycles Dose modification:   (original dose 541.8 mg, Cycle  1) Administration: 520 mg (07/20/2019), 520 mg (08/10/2019), 520 mg (08/31/2019) PACLitaxel (TAXOL) 300 mg in sodium chloride 0.9 % 250 mL chemo infusion (> '80mg'$ /m2), 200 mg/m2 = 300 mg, Intravenous,  Once, 3 of 6 cycles Administration: 300 mg (07/20/2019), 300 mg (08/10/2019), 300 mg (08/31/2019) fosaprepitant (EMEND) 150 mg, dexamethasone (DECADRON) 12 mg in sodium chloride 0.9 % 145 mL IVPB, , Intravenous,  Once, 3 of 6 cycles Administration:  (07/20/2019),  (08/10/2019),  (08/31/2019)  for chemotherapy treatment.    10/30/2019 -  Chemotherapy   The patient had pembrolizumab (KEYTRUDA) 200 mg in sodium chloride 0.9 % 50 mL chemo infusion, 200 mg, Intravenous, Once, 2 of 6 cycles Administration: 200 mg (10/30/2019), 200 mg (11/20/2019)  for chemotherapy treatment.    11/17/2019 Cancer Staging   Staging form: Cervix Uteri, AJCC 8th Edition - Clinical stage from 11/17/2019: FIGO Stage IVB (cT3b, cN1, pM1) - Signed by Lloyd Huger, MD on 11/17/2019    Interval history- Mariah Mcmahon is a 54 year old female who presents to symptom management for complaints of constipation.  Patient was diagnosed back in June 2020 with a biopsy that was positive for cervical cancer.  She has completed 6 cycles of cisplatin as of 06/28/2019 with radiation.  PET scan revealed a mixed response.  She received 3 cycles of carbo Taxol from 07/20/2019 through 08/31/2019.  PET scan revealed progression.  She had right ureteral stent placed on 10/02/2019.  She was screened to participate in a clinical trial at [facility] but due to persistent proteinuria she was not a candidate.  Patient had PD-L1 +3% on 10/09/2019.  On 10/29/2019 presented for acute abdominal pain.  Had imaging which showed a mixed response to therapy with further enlargement of lesions of the cervix and uterus and increased lymphadenopathy along the right pelvic sidewall adjacent to distal third of right ureter.  She was started on Keytruda on 10/29/2021 and she is currently  status post 2 cycles.  She was evaluated by gynecologic oncology on 11/21/2019 for increased vaginal bleeding and saturating 4 heavy pads per day.  She also complained of worsening pelvic pain.  She was evaluated by Billey Chang, NP for pain control optimization.  She was started on OxyContin.  She was also evaluated by Dr. Donella Stade who agrees for a course of palliative pelvic radiation to control bleeding.  Today, patient states she has not had a bowel movement in almost 1 week.  She has been evaluated by both Dr. Grayland Ormond and Vonna Kotyk. Borders this week and instructed to take MiraLAX, Senokot, lactulose and dulcolax suppositories.  Unfortunately, did not produce a bowel movement.  She feels bloated but denies any nausea or vomiting.  States it is uncomfortable but not in any additional pain.  She denies any fevers.  Her appetite has been fair and she has been eating very little due to bloating and feeling of "fullness".  She denies any chest pain, nausea, vomiting or diarrhea.  She denies any urinary concerns.  ECOG FS:1 - Symptomatic but completely ambulatory  Review of systems- Review of Systems  Constitutional: Positive for malaise/fatigue. Negative for chills, fever and weight loss.  HENT: Negative for congestion, ear pain and tinnitus.   Eyes: Negative.  Negative for blurred vision and double vision.  Respiratory: Negative.  Negative for cough, sputum production and shortness of breath.   Cardiovascular: Negative.  Negative for chest pain, palpitations and leg swelling.  Gastrointestinal: Positive for abdominal pain and constipation. Negative for diarrhea, nausea and vomiting.  Genitourinary: Negative for dysuria, frequency and urgency.  Musculoskeletal: Negative for back pain and falls.  Skin: Negative.  Negative for rash.  Neurological: Negative.  Negative for weakness and headaches.  Endo/Heme/Allergies: Negative.  Does not bruise/bleed easily.  Psychiatric/Behavioral: Negative.  Negative  for depression. The patient is not nervous/anxious and does not have insomnia.      Current treatment- s/p cycle 2 Keytruda (10/30/19-present). S/p 3 cycles carbo/taol (07/20/19-08/31/19). S/p 6 cycles cisplatin (05/17/19-06/28/19)  No Known Allergies   Past Medical History:  Diagnosis Date  . Anemia   . Cancer ([place])    cwervical cancer  . Cervical cancer ([place])   . No pertinent past medical history      Past Surgical History:  Procedure Laterality Date  . CYSTOSCOPY W/ URETERAL STENT PLACEMENT Right 10/02/2019   Procedure: CYSTOSCOPY WITH RETROGRADE PYELOGRAM/URETERAL STENT PLACEMENT;  Surgeon: Hollice Espy, MD;  Location: [facility] ORS;  Service: Urology;  Laterality: Right;  . IVC FILTER REMOVAL N/A 08/07/2019   Procedure: IVC FILTER REMOVAL;  Surgeon: Katha Cabal, MD;  Location: [place] [facility];  Service: Cardiovascular;  Laterality: N/A;  . MASS EXCISION     Throat  . PERIPHERAL VASCULAR THROMBECTOMY Left 06/05/2019   Procedure: PERIPHERAL VASCULAR THROMBECTOMY WITH IVC FILTER (LEFT LOWER EXTREMITY);  Surgeon: Katha Cabal, MD;  Location: [place] [facility];  Service: Cardiovascular;  Laterality: Left;  . PORTA CATH INSERTION N/A 06/06/2019   Procedure: PORTA CATH INSERTION;  Surgeon: Katha Cabal, MD;  Location: [place] [facility];  Service: Cardiovascular;  Laterality: N/A;  . TONSILLECTOMY      Social History   Socioeconomic History  .  Marital status: Divorced    Spouse name: Not on file  . Number of children: Not on file  . Years of education: Not on file  . Highest education level: Not on file  Occupational History  . Occupation: [employer]    Employer: [employer] [place]  Tobacco Use  . Smoking status: Former Smoker    Packs/day: 0.50    Years: 30.00    Pack years: 15.00    Types: Cigarettes    Quit date: 07/03/2019    Years since quitting: 0.4  . Smokeless tobacco: Never Used  Substance and Sexual Activity  . Alcohol use: Not  Currently  . Drug use: Never  . Sexual activity: Yes    Comment: not at moment  Other Topics Concern  . Not on file  Social History Narrative  . Not on file   Social Determinants of Health   Financial Resource Strain: Low Risk   . Difficulty of Paying Living Expenses: Not hard at all  Food Insecurity: No Food Insecurity  . Worried About Charity fundraiser in the Last Year: Never true  . Ran Out of Food in the Last Year: Never true  Transportation Needs: No Transportation Needs  . Lack of Transportation (Medical): No  . Lack of Transportation (Non-Medical): No  Physical Activity: Sufficiently Active  . Days of Exercise per Week: 6 days  . Minutes of Exercise per Session: 40 min  Stress: No Stress Concern Present  . Feeling of Stress : Not at all  Social Connections: Moderately Isolated  . Frequency of Communication with Friends and Family: More than three times a week  . Frequency of Social Gatherings with Friends and Family: More than three times a week  . Attends Religious Services: Never  . Active Member of Clubs or Organizations: No  . Attends Archivist Meetings: Never  . Marital Status: Divorced  Human resources officer Violence: Not At Risk  . Fear of Current or Ex-Partner: No  . Emotionally Abused: No  . Physically Abused: No  . Sexually Abused: No    Family History  Problem Relation Age of Onset  . Brain cancer Mother 45  . Alcohol abuse Father   . Heart disease Father   . Prostate cancer Father   . Heart attack Father   . Diabetes Father      Current Outpatient Medications:  .  ALPRAZolam (XANAX) 0.25 MG tablet, Take 1 tablet (0.25 mg total) by mouth 2 (two) times daily as needed for anxiety. (Patient taking differently: Take 0.25 mg by mouth 2 (two) times daily as needed for anxiety. Rarely takes), Disp: 45 tablet, Rfl: 0 .  apixaban (ELIQUIS) 5 MG TABS tablet, Take 1 tablet (5 mg total) by mouth 2 (two) times daily., Disp: 180 tablet, Rfl: 1 .   dexamethasone (DECADRON) 4 MG tablet, Take 1 tablet (4 mg total) by mouth daily., Disp: 14 tablet, Rfl: 0 .  lactulose (CHRONULAC) 10 GM/15ML solution, Take 15-30 mLs (10-20 g total) by mouth daily as needed (constipation). (Patient taking differently: Take 10-20 g by mouth 2 (two) times daily. ), Disp: 236 mL, Rfl: 0 .  morphine (MS CONTIN) 15 MG 12 hr tablet, Take 1 tablet (15 mg total) by mouth every 8 (eight) hours., Disp: 45 tablet, Rfl: 0 .  ondansetron (ZOFRAN) 8 MG tablet, Take 1 tablet (8 mg total) by mouth every 8 (eight) hours as needed for nausea or vomiting., Disp: 45 tablet, Rfl: 0 .  oxybutynin (DITROPAN-XL) 5  MG 24 hr tablet, Take 1 tablet (5 mg total) by mouth at bedtime. TAKE 1 TABLET BY MOUTH EVERYDAY AT BEDTIME, Disp: 30 tablet, Rfl: 0 .  oxyCODONE (OXY IR/ROXICODONE) 5 MG immediate release tablet, Take 1-2 tablets (5-10 mg total) by mouth every 4 (four) hours as needed for severe pain., Disp: 60 tablet, Rfl: 0 .  tamsulosin (FLOMAX) 0.4 MG CAPS capsule, TAKE 1 CAPSULE BY MOUTH EVERY DAY, Disp: 30 capsule, Rfl: 1 .  mirtazapine (REMERON) 7.5 MG tablet, Take 1 tablet (7.5 mg total) by mouth at bedtime. (Patient not taking: Reported on 11/21/2019), Disp: 30 tablet, Rfl: 2 .  oxyCODONE ER 27 MG C12A, Take 1 capsule by mouth every 12 (twelve) hours. (Patient not taking: Reported on 11/21/2019), Disp: 60 capsule, Rfl: 0 .  sodium phosphate (FLEET) 7-19 GM/118ML ENEM, Place 133 mLs (1 enema total) rectally daily as needed for severe constipation., Disp: 2 enema, Rfl: 0 No current facility-administered medications for this visit.  Facility-Administered Medications Ordered in Other Visits:  .  sodium chloride flush (NS) 0.9 % injection 10 mL, 10 mL, Intravenous, PRN, Lloyd Huger, MD, 10 mL at 11/27/19 1028  Physical exam:  Vitals:   11/29/19 0920  BP: (!) 86/63  Pulse: (!) 118  Resp: 18  Temp: 99 F (37.2 C)  TempSrc: Tympanic   Physical Exam Constitutional:       Appearance: Normal appearance.  HENT:     Head: Normocephalic and atraumatic.  Eyes:     Pupils: Pupils are equal, round, and reactive to light.  Cardiovascular:     Rate and Rhythm: Normal rate and regular rhythm.     Heart sounds: Normal heart sounds. No murmur.  Pulmonary:     Effort: Pulmonary effort is normal.     Breath sounds: Normal breath sounds. No wheezing.  Abdominal:     General: Bowel sounds are normal. There is no distension.     Palpations: Abdomen is soft.     Tenderness: There is no abdominal tenderness.     Comments: Auscultated bowel sounds in all 4 quadrants.  Musculoskeletal:        General: Normal range of motion.     Cervical back: Normal range of motion.  Skin:    General: Skin is warm and dry.     Findings: No rash.  Neurological:     Mental Status: She is alert and oriented to person, place, and time.  Psychiatric:        Judgment: Judgment normal.      CMP Latest Ref Rng & Units 11/29/2019  Glucose 70 - 99 mg/dL 116(H)  BUN 6 - 20 mg/dL 15  Creatinine 0.44 - 1.00 mg/dL 0.91  Sodium 135 - 145 mmol/L 134(L)  Potassium 3.5 - 5.1 mmol/L 3.4(L)  Chloride 98 - 111 mmol/L 98  CO2 22 - 32 mmol/L 26  Calcium 8.9 - 10.3 mg/dL 9.8  Total Protein 6.5 - 8.1 g/dL -  Total Bilirubin 0.3 - 1.2 mg/dL -  Alkaline Phos 38 - 126 U/L -  AST 15 - 41 U/L -  ALT 0 - 44 U/L -   CBC Latest Ref Rng & Units 11/29/2019  WBC 4.0 - 10.5 K/uL 8.7  Hemoglobin 12.0 - 15.0 g/dL 10.3(L)  Hematocrit 36.0 - 46.0 % 32.9(L)  Platelets 150 - 400 K/uL 321    No images are attached to the encounter.  No results found.   Assessment and plan- Patient is a 54 y.o. female who  presents to symptom management for constipation x1 week.  Stage IV cervical cancer: Status post several lines of treatment most recently on palliative Keytruda.  She is status post 2 cycles.  She is also awaiting radiation therapy for increased vaginal bleeding.  She is scheduled to return to clinic on  12/04/2019 to begin radiation therapy and on 12/05/2019 for cycle 3 of Keytruda.  Constipation: Likely opioid induced.  Can try magnesium citrate x2 bottles and a fleets enema.  Patient has good bowel sounds on assessment.  Unlikely obstructed given no abdominal pain, nausea or vomiting.  We will have her return to clinic tomorrow for assessment.  Dehydration/hypotension: She has had a series of low blood pressures.  Would recommend additional IV fluids today.  See plan below.  Plan: Labs. Vitals: Hypotensive.  Give 1 L NaCl for hypotension. Recommend magnesium citrate OTC Rx fleets enema. If no BM- possible imaging.   Disposition: RTC tomorrow for lab work, assessment and additional IV fluids if needed.   Visit Diagnosis 1. Dehydration   2. Drug-induced constipation     Patient expressed understanding and was in agreement with this plan. She also understands that She can call clinic at any time with any questions, concerns, or complaints.   Greater than 50% was spent in counseling and coordination of care with this patient including but not limited to discussion of the relevant topics above (See A&P) including, but not limited to diagnosis and management of acute and chronic medical conditions.   Thank you for allowing me to participate in the care of this very pleasant patient.    Jacquelin Hawking, NP [place] [facility] Cell - [phone] Pager- [phone] 11/29/2019 3:43 PM

## 2019-11-29 NOTE — Patient Instructions (Signed)
I am so sorry that you are not feeling well today.  I discussed with Dr. Grayland Ormond and we recommend you to try magnesium citrate which you can purchase over-the-counter at any pharmacy.  I would buy to bottles and try 1 first.  You can mix this with a liquid of your choice.  If you do not have a bowel movement in 4 to 5 hours you can drink half of the entire bottle of the second 1.  I also think we could try fleets enema.  They will give you instructions on how to use at the pharmacy.  I heard great bowel sounds all the way to the very bottom of your colon near your rectum.  If you are able to do a fleets enema may be this will help start the process.  After you have a bowel movement, we will start with 2 Senokot each day and add in MiraLAX daily at first with the intention to increase to twice daily if you are not having daily bowel movements.  If for some reason, you develop any pain, nausea or vomiting during this process please call us and/or go directly to the emergency room.  Like I mentioned before, I did hear great bowel sounds which makes me less worried about an obstruction in your gut.  I will call you later to check on you to see if we have had a bowel movement.  If you need anything please call us at [phone] or [phone].  Faythe Casa, NP 11/29/2019 10:30 AM

## 2019-11-30 ENCOUNTER — Other Ambulatory Visit: Payer: Self-pay

## 2019-11-30 ENCOUNTER — Inpatient Hospital Stay: Payer: BC Managed Care – PPO

## 2019-11-30 ENCOUNTER — Inpatient Hospital Stay (HOSPITAL_BASED_OUTPATIENT_CLINIC_OR_DEPARTMENT_OTHER): Payer: BC Managed Care – PPO | Admitting: Oncology

## 2019-11-30 VITALS — BP 112/71 | HR 78 | Temp 98.3°F | Resp 16

## 2019-11-30 VITALS — BP 99/66 | HR 98 | Temp 99.0°F | Resp 18

## 2019-11-30 DIAGNOSIS — G893 Neoplasm related pain (acute) (chronic): Secondary | ICD-10-CM

## 2019-11-30 DIAGNOSIS — N939 Abnormal uterine and vaginal bleeding, unspecified: Secondary | ICD-10-CM

## 2019-11-30 DIAGNOSIS — Z5112 Encounter for antineoplastic immunotherapy: Secondary | ICD-10-CM | POA: Diagnosis not present

## 2019-11-30 DIAGNOSIS — G4700 Insomnia, unspecified: Secondary | ICD-10-CM | POA: Diagnosis not present

## 2019-11-30 DIAGNOSIS — E860 Dehydration: Secondary | ICD-10-CM | POA: Diagnosis not present

## 2019-11-30 DIAGNOSIS — R140 Abdominal distension (gaseous): Secondary | ICD-10-CM | POA: Diagnosis not present

## 2019-11-30 DIAGNOSIS — C530 Malignant neoplasm of endocervix: Secondary | ICD-10-CM | POA: Diagnosis not present

## 2019-11-30 DIAGNOSIS — D500 Iron deficiency anemia secondary to blood loss (chronic): Secondary | ICD-10-CM | POA: Diagnosis not present

## 2019-11-30 DIAGNOSIS — C539 Malignant neoplasm of cervix uteri, unspecified: Secondary | ICD-10-CM

## 2019-11-30 DIAGNOSIS — Z515 Encounter for palliative care: Secondary | ICD-10-CM | POA: Diagnosis not present

## 2019-11-30 DIAGNOSIS — R630 Anorexia: Secondary | ICD-10-CM | POA: Diagnosis not present

## 2019-11-30 DIAGNOSIS — Z79899 Other long term (current) drug therapy: Secondary | ICD-10-CM | POA: Diagnosis not present

## 2019-11-30 DIAGNOSIS — F419 Anxiety disorder, unspecified: Secondary | ICD-10-CM | POA: Diagnosis not present

## 2019-11-30 DIAGNOSIS — K5900 Constipation, unspecified: Secondary | ICD-10-CM | POA: Diagnosis not present

## 2019-11-30 DIAGNOSIS — C7911 Secondary malignant neoplasm of bladder: Secondary | ICD-10-CM | POA: Diagnosis not present

## 2019-11-30 DIAGNOSIS — N938 Other specified abnormal uterine and vaginal bleeding: Secondary | ICD-10-CM | POA: Diagnosis not present

## 2019-11-30 DIAGNOSIS — C778 Secondary and unspecified malignant neoplasm of lymph nodes of multiple regions: Secondary | ICD-10-CM | POA: Diagnosis not present

## 2019-11-30 DIAGNOSIS — C786 Secondary malignant neoplasm of retroperitoneum and peritoneum: Secondary | ICD-10-CM | POA: Diagnosis not present

## 2019-11-30 DIAGNOSIS — K5903 Drug induced constipation: Secondary | ICD-10-CM | POA: Diagnosis not present

## 2019-11-30 LAB — COMPREHENSIVE METABOLIC PANEL
ALT: 8 U/L (ref 0–44)
AST: 15 U/L (ref 15–41)
Albumin: 2.8 g/dL — ABNORMAL LOW (ref 3.5–5.0)
Alkaline Phosphatase: 78 U/L (ref 38–126)
Anion gap: 9 (ref 5–15)
BUN: 14 mg/dL (ref 6–20)
CO2: 22 mmol/L (ref 22–32)
Calcium: 8.7 mg/dL — ABNORMAL LOW (ref 8.9–10.3)
Chloride: 102 mmol/L (ref 98–111)
Creatinine, Ser: 0.75 mg/dL (ref 0.44–1.00)
GFR calc Af Amer: >60 mL/min (ref >60)
GFR calc non Af Amer: >60 mL/min (ref >60)
Glucose, Bld: 111 mg/dL — ABNORMAL HIGH (ref 70–99)
Potassium: 3.2 mmol/L — ABNORMAL LOW (ref 3.5–5.1)
Sodium: 133 mmol/L — ABNORMAL LOW (ref 135–145)
Total Bilirubin: 0.8 mg/dL (ref 0.3–1.2)
Total Protein: 5.7 g/dL — ABNORMAL LOW (ref 6.5–8.1)

## 2019-11-30 LAB — CBC WITH DIFFERENTIAL/PLATELET
Abs Immature Granulocytes: 0.01 10*3/uL (ref 0.00–0.07)
Basophils Absolute: 0 10*3/uL (ref 0.0–0.1)
Basophils Relative: 1 %
Eosinophils Absolute: 0.1 10*3/uL (ref 0.0–0.5)
Eosinophils Relative: 2 %
HCT: 26 % — ABNORMAL LOW (ref 36.0–46.0)
Hemoglobin: 8 g/dL — ABNORMAL LOW (ref 12.0–15.0)
Immature Granulocytes: 0 %
Lymphocytes Relative: 6 %
Lymphs Abs: 0.4 10*3/uL — ABNORMAL LOW (ref 0.7–4.0)
MCH: 30 pg (ref 26.0–34.0)
MCHC: 30.8 g/dL (ref 30.0–36.0)
MCV: 97.4 fL (ref 80.0–100.0)
Monocytes Absolute: 0.9 10*3/uL (ref 0.1–1.0)
Monocytes Relative: 13 %
Neutro Abs: 5.8 10*3/uL (ref 1.7–7.7)
Neutrophils Relative %: 78 %
Platelets: 258 10*3/uL (ref 150–400)
RBC: 2.67 MIL/uL — ABNORMAL LOW (ref 3.87–5.11)
RDW: 18 % — ABNORMAL HIGH (ref 11.5–15.5)
WBC: 7.3 10*3/uL (ref 4.0–10.5)
nRBC: 0 % (ref 0.0–0.2)

## 2019-11-30 LAB — PREPARE RBC (CROSSMATCH)

## 2019-11-30 MED ORDER — ACETAMINOPHEN 325 MG PO TABS
ORAL_TABLET | ORAL | Status: AC
  Filled 2019-11-30: qty 2

## 2019-11-30 MED ORDER — SODIUM CHLORIDE 0.9% IV SOLUTION
Freq: Once | INTRAVENOUS | Status: AC
  Filled 2019-11-30: qty 250

## 2019-11-30 MED ORDER — DIPHENHYDRAMINE HCL 25 MG PO CAPS
ORAL_CAPSULE | ORAL | Status: AC
  Filled 2019-11-30: qty 1

## 2019-11-30 MED ORDER — ACETAMINOPHEN 325 MG PO TABS
650.0000 mg | ORAL_TABLET | Freq: Once | ORAL | Status: AC
  Administered 2019-11-30: 650 mg via ORAL

## 2019-11-30 MED ORDER — MORPHINE SULFATE (PF) 2 MG/ML IV SOLN
INTRAVENOUS | Status: AC
  Filled 2019-11-30: qty 1

## 2019-11-30 MED ORDER — SODIUM CHLORIDE 0.9 % IV SOLN
Freq: Once | INTRAVENOUS | Status: AC
  Filled 2019-11-30: qty 250

## 2019-11-30 MED ORDER — MORPHINE SULFATE (PF) 2 MG/ML IV SOLN
2.0000 mg | Freq: Once | INTRAVENOUS | Status: AC
  Administered 2019-11-30: 13:00:00 2 mg via INTRAVENOUS

## 2019-11-30 MED ORDER — DIPHENHYDRAMINE HCL 25 MG PO CAPS
25.0000 mg | ORAL_CAPSULE | Freq: Once | ORAL | Status: AC
  Administered 2019-11-30: 13:00:00 25 mg via ORAL

## 2019-11-30 NOTE — Progress Notes (Signed)
Symptom Management Consult note [facility]  [phone] [phone]  Patient Care Team: Venita Lick, NP as PCP - General (Nurse Practitioner) Clent Jacks, RN as Oncology Nurse Navigator   Name of the patient: Mariah Mcmahon  [id number]  07-20-1966   Date of visit: 11/30/2019   Diagnosis- 1. Vaginal bleeding - 0.9 %  sodium chloride infusion (Manually program via Guardrails IV Fluids) - acetaminophen (TYLENOL) tablet 650 mg - diphenhydrAMINE (BENADRYL) capsule 25 mg  Other orders - Practitioner attestation of consent; Standing - Complete patient signature process for consent form; Standing    Chief complaint/ Reason for visit- follow-up-constipation  Heme/Onc history:  Oncology History  Cervical cancer ([place])  05/04/2019 Initial Diagnosis   Cervical cancer ([place])   05/17/2019 - 07/04/2019 Chemotherapy   The patient had palonosetron (ALOXI) injection 0.25 mg, 0.25 mg, Intravenous,  Once, 6 of 7 cycles Administration: 0.25 mg (05/17/2019), 0.25 mg (05/24/2019), 0.25 mg (05/31/2019), 0.25 mg (06/14/2019), 0.25 mg (06/22/2019), 0.25 mg (06/28/2019) CISplatin (PLATINOL) 61 mg in sodium chloride 0.9 % 250 mL chemo infusion, 40 mg/m2 = 61 mg, Intravenous,  Once, 6 of 7 cycles Administration: 61 mg (05/17/2019), 61 mg (05/24/2019), 61 mg (05/31/2019), 61 mg (06/14/2019), 61 mg (06/22/2019), 61 mg (06/28/2019) fosaprepitant (EMEND) 150 mg, dexamethasone (DECADRON) 12 mg in sodium chloride 0.9 % 145 mL IVPB, , Intravenous,  Once, 6 of 7 cycles Administration:  (05/17/2019),  (05/24/2019),  (05/31/2019),  (06/14/2019),  (06/22/2019),  (06/28/2019)  for chemotherapy treatment.    07/20/2019 - 09/20/2019 Chemotherapy   The patient had palonosetron (ALOXI) injection 0.25 mg, 0.25 mg, Intravenous,  Once, 3 of 6 cycles Administration: 0.25 mg (07/20/2019), 0.25 mg (08/10/2019), 0.25 mg (08/31/2019) pegfilgrastim (NEULASTA ONPRO KIT) injection 6 mg, 6 mg, Subcutaneous,  Once, 2 of 5 cycles Administration: 6 mg (07/20/2019), 6 mg (08/31/2019) CARBOplatin (PARAPLATIN) 520 mg in sodium chloride 0.9 % 250 mL chemo infusion, 520 mg (95.1 % of original dose 541.8 mg), Intravenous,  Once, 3 of 6 cycles Dose modification:   (original dose 541.8 mg, Cycle 1) Administration: 520 mg (07/20/2019), 520 mg (08/10/2019), 520 mg (08/31/2019) PACLitaxel (TAXOL) 300 mg in sodium chloride 0.9 % 250 mL chemo infusion (> '80mg'$ /m2), 200 mg/m2 = 300 mg, Intravenous,  Once, 3 of 6 cycles Administration: 300 mg (07/20/2019), 300 mg (08/10/2019), 300 mg (08/31/2019) fosaprepitant (EMEND) 150 mg, dexamethasone (DECADRON) 12 mg in sodium chloride 0.9 % 145 mL IVPB, , Intravenous,  Once, 3 of 6 cycles Administration:  (07/20/2019),  (08/10/2019),  (08/31/2019)  for chemotherapy treatment.    10/30/2019 -  Chemotherapy   The patient had pembrolizumab (KEYTRUDA) 200 mg in sodium chloride 0.9 % 50 mL chemo infusion, 200 mg, Intravenous, Once, 2 of 6 cycles Administration: 200 mg (10/30/2019), 200 mg (11/20/2019)  for chemotherapy treatment.    11/17/2019 Cancer Staging   Staging form: Cervix Uteri, AJCC 8th Edition - Clinical stage from 11/17/2019: FIGO Stage IVB (cT3b, cN1, pM1) - Signed by Lloyd Huger, MD on 11/17/2019    Interval history-Mariah Mcmahon is a 54 year old female who presents to symptom management for follow-up.  She was evaluated yesterday in [facility] for constipation and was given IV fluids and instructed to try magnesium citrate and a fleets enema to try to produce a bowel movement.  Earlier in the week she was asked to try lactulose and Dulcolax suppositories which unfortunately did not produce a bowel movement.  Today, patient states she was unable to tolerate  the magnesium citrate but did have a bowel movement with the fleets enema.  She feels like it was a large bowel movement but does not feel "cleaned out".  A few minutes after her bowel movement she had 3-4 fist-sized blood clots  appear from her vagina.  Patient states this is not a new symptom and occurred a few months ago. She was evaluated by gynecological oncology on 11/21/2019 for vaginal bleeding and pelvic pain.  Examination revealed a very friable mass which appeared to be causing the bleeding. She is scheduled to begin radiation next Tuesday to try to minimize bleeding and help with pelvic discomfort She does report feeling slightly dizzy and unsteady after this incident.  She continues to deny any fevers, shortness of breath, chest pain, diarrhea.  Patient was able to eat a small amount of food without nausea or vomiting.  Patient was diagnosed back in June 2020 with a biopsy that was positive for cervical cancer.  She is completed 6 cycles of cisplatin as of 06/28/2019 with radiation.  PET scan revealed a mixed response.  She received 3 cycles of carbotaxol from 07/20/2019 through 08/31/2019.  PET scan results revealed progression.  She had right ureteral stent placed on 10/02/2019.  She was screened to participate in a clinical trial at [facility] but due to persistent proteinuria she was not a candidate.  Patient PD-L1 positive.  On 10/29/2019 she presented for abdominal pain.  Subsequent imaging showed a mixed response to therapy with further enlargement of the lesion of the cervix and uterus and increased lymphadenopathy along the right pelvic sidewall.  She was started on Keytruda on 10/30/2019.  She is currently status post 2 cycles.  ECOG FS:2 - Symptomatic, <50% confined to bed  Review of systems- Review of Systems  Constitutional: Negative.  Negative for chills, fever, malaise/fatigue and weight loss.  HENT: Negative for congestion, ear pain and tinnitus.   Eyes: Negative.  Negative for blurred vision and double vision.  Respiratory: Negative.  Negative for cough, sputum production and shortness of breath.   Cardiovascular: Negative.  Negative for chest pain, palpitations and leg swelling.  Gastrointestinal: Positive  for abdominal pain and constipation. Negative for diarrhea, nausea and vomiting.  Genitourinary: Negative for dysuria, frequency and urgency.       Vaginal bleeding  Musculoskeletal: Positive for back pain. Negative for falls.  Skin: Negative.  Negative for rash.  Neurological: Positive for dizziness and weakness. Negative for headaches.  Endo/Heme/Allergies: Negative.  Does not bruise/bleed easily.  Psychiatric/Behavioral: Negative for depression. The patient is nervous/anxious. The patient does not have insomnia.      Current treatment- s/p 2 cycles of Keytruda.  No Known Allergies   Past Medical History:  Diagnosis Date  . Anemia   . Cancer ([place])    cwervical cancer  . Cervical cancer ([place])   . No pertinent past medical history      Past Surgical History:  Procedure Laterality Date  . CYSTOSCOPY W/ URETERAL STENT PLACEMENT Right 10/02/2019   Procedure: CYSTOSCOPY WITH RETROGRADE PYELOGRAM/URETERAL STENT PLACEMENT;  Surgeon: Hollice Espy, MD;  Location: [facility] ORS;  Service: Urology;  Laterality: Right;  . IVC FILTER REMOVAL N/A 08/07/2019   Procedure: IVC FILTER REMOVAL;  Surgeon: Katha Cabal, MD;  Location: [place] [facility];  Service: Cardiovascular;  Laterality: N/A;  . MASS EXCISION     Throat  . PERIPHERAL VASCULAR THROMBECTOMY Left 06/05/2019   Procedure: PERIPHERAL VASCULAR THROMBECTOMY WITH IVC FILTER (LEFT LOWER EXTREMITY);  Surgeon:  Schnier, Dolores Lory, MD;  Location: [place] [facility];  Service: Cardiovascular;  Laterality: Left;  . PORTA CATH INSERTION N/A 06/06/2019   Procedure: PORTA CATH INSERTION;  Surgeon: Katha Cabal, MD;  Location: [place] [facility];  Service: Cardiovascular;  Laterality: N/A;  . TONSILLECTOMY      Social History   Socioeconomic History  . Marital status: Divorced    Spouse name: Not on file  . Number of children: Not on file  . Years of education: Not on file  . Highest education level: Not on file    Occupational History  . Occupation: [employer]    Employer: [employer]  Tobacco Use  . Smoking status: Former Smoker    Packs/day: 0.50    Years: 30.00    Pack years: 15.00    Types: Cigarettes    Quit date: 07/03/2019    Years since quitting: 0.4  . Smokeless tobacco: Never Used  Substance and Sexual Activity  . Alcohol use: Not Currently  . Drug use: Never  . Sexual activity: Yes    Comment: not at moment  Other Topics Concern  . Not on file  Social History Narrative  . Not on file   Social Determinants of Health   Financial Resource Strain: Low Risk   . Difficulty of Paying Living Expenses: Not hard at all  Food Insecurity: No Food Insecurity  . Worried About Charity fundraiser in the Last Year: Never true  . Ran Out of Food in the Last Year: Never true  Transportation Needs: No Transportation Needs  . Lack of Transportation (Medical): No  . Lack of Transportation (Non-Medical): No  Physical Activity: Sufficiently Active  . Days of Exercise per Week: 6 days  . Minutes of Exercise per Session: 40 min  Stress: No Stress Concern Present  . Feeling of Stress : Not at all  Social Connections: Moderately Isolated  . Frequency of Communication with Friends and Family: More than three times a week  . Frequency of Social Gatherings with Friends and Family: More than three times a week  . Attends Religious Services: Never  . Active Member of Clubs or Organizations: No  . Attends Archivist Meetings: Never  . Marital Status: Divorced  Human resources officer Violence: Not At Risk  . Fear of Current or Ex-Partner: No  . Emotionally Abused: No  . Physically Abused: No  . Sexually Abused: No    Family History  Problem Relation Age of Onset  . Brain cancer Mother 34  . Alcohol abuse Father   . Heart disease Father   . Prostate cancer Father   . Heart attack Father   . Diabetes Father      Current Outpatient Medications:  .  ALPRAZolam (XANAX) 0.25  MG tablet, Take 1 tablet (0.25 mg total) by mouth 2 (two) times daily as needed for anxiety. (Patient taking differently: Take 0.25 mg by mouth 2 (two) times daily as needed for anxiety. Rarely takes), Disp: 45 tablet, Rfl: 0 .  apixaban (ELIQUIS) 5 MG TABS tablet, Take 1 tablet (5 mg total) by mouth 2 (two) times daily., Disp: 180 tablet, Rfl: 1 .  dexamethasone (DECADRON) 4 MG tablet, Take 1 tablet (4 mg total) by mouth daily., Disp: 14 tablet, Rfl: 0 .  lactulose (CHRONULAC) 10 GM/15ML solution, Take 15-30 mLs (10-20 g total) by mouth daily as needed (constipation). (Patient taking differently: Take 10-20 g by mouth 2 (two) times daily. ), Disp: 236 mL,  Rfl: 0 .  mirtazapine (REMERON) 7.5 MG tablet, Take 1 tablet (7.5 mg total) by mouth at bedtime., Disp: 30 tablet, Rfl: 2 .  morphine (MS CONTIN) 15 MG 12 hr tablet, Take 1 tablet (15 mg total) by mouth every 8 (eight) hours., Disp: 45 tablet, Rfl: 0 .  ondansetron (ZOFRAN) 8 MG tablet, Take 1 tablet (8 mg total) by mouth every 8 (eight) hours as needed for nausea or vomiting., Disp: 45 tablet, Rfl: 0 .  oxybutynin (DITROPAN-XL) 5 MG 24 hr tablet, Take 1 tablet (5 mg total) by mouth at bedtime. TAKE 1 TABLET BY MOUTH EVERYDAY AT BEDTIME, Disp: 30 tablet, Rfl: 0 .  oxyCODONE (OXY IR/ROXICODONE) 5 MG immediate release tablet, Take 1-2 tablets (5-10 mg total) by mouth every 4 (four) hours as needed for severe pain., Disp: 60 tablet, Rfl: 0 .  oxyCODONE ER 27 MG C12A, Take 1 capsule by mouth every 12 (twelve) hours., Disp: 60 capsule, Rfl: 0 .  sodium phosphate (FLEET) 7-19 GM/118ML ENEM, Place 133 mLs (1 enema total) rectally daily as needed for severe constipation., Disp: 2 enema, Rfl: 0 .  tamsulosin (FLOMAX) 0.4 MG CAPS capsule, TAKE 1 CAPSULE BY MOUTH EVERY DAY, Disp: 30 capsule, Rfl: 1 No current facility-administered medications for this visit.  Facility-Administered Medications Ordered in Other Visits:  .  sodium chloride flush (NS) 0.9 %  injection 10 mL, 10 mL, Intravenous, PRN, Lloyd Huger, MD, 10 mL at 11/27/19 1028  Physical exam:  Vitals:   11/30/19 1051  BP: 99/66  Pulse: 98  Resp: 18  Temp: 99 F (37.2 C)  TempSrc: Tympanic   Physical Exam Constitutional:      Appearance: Normal appearance.  HENT:     Head: Normocephalic and atraumatic.  Eyes:     Pupils: Pupils are equal, round, and reactive to light.  Cardiovascular:     Rate and Rhythm: Normal rate and regular rhythm.     Heart sounds: Normal heart sounds. No murmur.  Pulmonary:     Effort: Pulmonary effort is normal.     Breath sounds: Normal breath sounds. No wheezing.  Abdominal:     General: Bowel sounds are normal. There is no distension.     Palpations: Abdomen is soft.     Tenderness: There is abdominal tenderness.  Musculoskeletal:        General: Normal range of motion.     Cervical back: Normal range of motion.  Skin:    General: Skin is warm and dry.     Findings: No rash.  Neurological:     Mental Status: She is alert and oriented to person, place, and time.  Psychiatric:        Judgment: Judgment normal.      CMP Latest Ref Rng & Units 11/30/2019  Glucose 70 - 99 mg/dL 111(H)  BUN 6 - 20 mg/dL 14  Creatinine 0.44 - 1.00 mg/dL 0.75  Sodium 135 - 145 mmol/L 133(L)  Potassium 3.5 - 5.1 mmol/L 3.2(L)  Chloride 98 - 111 mmol/L 102  CO2 22 - 32 mmol/L 22  Calcium 8.9 - 10.3 mg/dL 8.7(L)  Total Protein 6.5 - 8.1 g/dL 5.7(L)  Total Bilirubin 0.3 - 1.2 mg/dL 0.8  Alkaline Phos 38 - 126 U/L 78  AST 15 - 41 U/L 15  ALT 0 - 44 U/L 8   CBC Latest Ref Rng & Units 11/30/2019  WBC 4.0 - 10.5 K/uL 7.3  Hemoglobin 12.0 - 15.0 g/dL 8.0(L)  Hematocrit 36.0 -  46.0 % 26.0(L)  Platelets 150 - 400 K/uL 258    No images are attached to the encounter.  No results found.   Assessment and plan- Patient is a 55 y.o. female who presents to symptom management for follow-up of constipation x1 week.  Stage IV cervical cancer: Status  post several lines of treatment most recently on palliative Keytruda.  She is status post 2 cycles.  She is awaiting radiation therapy for increased vaginal bleeding that will begin next week.  She is scheduled for cycle 2 of Keytruda on 12/05/2019.  Constipation: Likely opiate induced.  Produced a large bowel movement with fleets enema.  Recommend 1 additional fleets enema tonight and then resume prophylactic bowel regimen.  This will include Senokot twice daily and MiraLAX twice daily.  Altha Harm, NP recommended Movantik but unfortunately this is too expensive at this time.  We will continue to monitor.  Vaginal bleeding: Patient admits to 3-4 for size blood clots after bowel movement last night.  Given her history, would recommend lab work to evaluate her hemoglobin.  Anemia/dizziness: Due to vaginal bleeding.  Patient's hemoglobin yesterday was 10.3 and today is 8.0.  Attempted to get her scheduled for blood on Monday but infusion and short stay or full.  Spoke with Dr. Grayland Ormond, and given that she dropped 2 g in 1 day and she continues to actively bleed will give her 1 unit packed red blood cells today.  Patient in agreement.  She is scheduled to begin radiation on Tuesday.  This will hopefully help with the bleeding.  Abdominal pain/right-sided back pain: Malignancy pain.  Chronic.  Will give pain medicine while in clinic today.  See plan below.  Plan: Labs.  Hemoglobin 8 (10.3) Vital signs.  Improved. Give 1 L NaCl. Give 1 unit packed red blood cells. Give 2 mg morphine IV. Give 1 additional fleets enema when she returns home. Continue MiraLAX twice daily and Senokot twice daily. Continue narcotics as needed.  Disposition: Return to clinic as scheduled on 12/03/2018 for radiation. RTC on 12/04/2018 for consideration of cycle 3 Keytruda.   Visit Diagnosis 1. Vaginal bleeding   2. Neoplasm related pain   3. Drug-induced constipation     Patient expressed understanding and was in  agreement with this plan. She also understands that She can call clinic at any time with any questions, concerns, or complaints.   Greater than 50% was spent in counseling and coordination of care with this patient including but not limited to discussion of the relevant topics above (See A&P) including, but not limited to diagnosis and management of acute and chronic medical conditions.   Thank you for allowing me to participate in the care of this very pleasant patient.    Jacquelin Hawking, NP [place] [facility] Cell - [phone] Pager- [phone] 11/30/2019 3:52 PM

## 2019-11-30 NOTE — Patient Instructions (Signed)
I am so sorry you are not feeling well today.  While you were here you were given 1 L of normal saline, 2 mg IV morphine and 1 unit of packed red blood cells.  I am glad that you are able to produce a bowel movement.  Try 1 additional fleets enema tonight to see if you can have another good bowel movement.  Try and eat bland foods.  I provided a sample diet for you.  Regarding your vaginal bleeding.  We gave you a unit of blood today because you were symptomatic with dizziness.  If this worsens over the weekend please report to the emergency room.  You are scheduled to begin radiation on Tuesday which hopefully will help with the bleeding.  Start MiraLAX twice a day and Senokot twice a day tomorrow to keep your bowels normal.  Continue your pain medicine as needed.  Call us with questions.  Bland Diet A bland diet consists of foods that are often soft and do not have a lot of fat, fiber, or extra seasonings. Foods without fat, fiber, or seasoning are easier for the body to digest. They are also less likely to irritate your mouth, throat, stomach, and other parts of your digestive system. A bland diet is sometimes called a BRAT diet. What is my plan? Your health care provider or food and nutrition specialist (dietitian) may recommend specific changes to your diet to prevent symptoms or to treat your symptoms. These changes may include:  Eating small meals often.  Cooking food until it is soft enough to chew easily.  Chewing your food well.  Drinking fluids slowly.  Not eating foods that are very spicy, sour, or fatty.  Not eating citrus fruits, such as oranges and grapefruit. What do I need to know about this diet?  Eat a variety of foods from the bland diet food list.  Do not follow a bland diet longer than needed.  Ask your health care provider whether you should take vitamins or supplements. What foods can I eat? Grains  Hot cereals, such as cream of wheat. Rice. Bread,  crackers, or tortillas made from refined white flour. Vegetables Canned or cooked vegetables. Mashed or boiled potatoes. Fruits  Bananas. Applesauce. Other types of cooked or canned fruit with the skin and seeds removed, such as canned peaches or pears. Meats and other proteins  Scrambled eggs. Creamy peanut butter or other nut butters. Lean, well-cooked meats, such as chicken or fish. Tofu. Soups or broths. Dairy Low-fat dairy products, such as milk, cottage cheese, or yogurt. Beverages  Water. Herbal tea. Apple juice. Fats and oils Mild salad dressings. Canola or olive oil. Sweets and desserts Pudding. Custard. Fruit gelatin. Ice cream. The items listed above may not be a complete list of recommended foods and beverages. Contact a dietitian for more options. What foods are not recommended? Grains Whole grain breads and cereals. Vegetables Raw vegetables. Fruits Raw fruits, especially citrus, berries, or dried fruits. Dairy Whole fat dairy foods. Beverages Caffeinated drinks. Alcohol. Seasonings and condiments Strongly flavored seasonings or condiments. Hot sauce. Salsa. Other foods Spicy foods. Fried foods. Sour foods, such as pickled or fermented foods. Foods with high sugar content. Foods high in fiber. The items listed above may not be a complete list of foods and beverages to avoid. Contact a dietitian for more information. Summary  A bland diet consists of foods that are often soft and do not have a lot of fat, fiber, or extra seasonings.  Foods  without fat, fiber, or seasoning are easier for the body to digest.  Check with your health care provider to see how long you should follow this diet plan. It is not meant to be followed for long periods. This information is not intended to replace advice given to you by your health care provider. Make sure you discuss any questions you have with your health care provider. Document Revised: 11/23/2017 Document Reviewed:  11/23/2017 Elsevier Patient Education  2020 [employer].

## 2019-12-01 LAB — TYPE AND SCREEN
ABO/RH(D): A POS
Antibody Screen: NEGATIVE
Unit division: 0

## 2019-12-01 LAB — BPAM RBC
Blood Product Expiration Date: 202102222359
ISSUE DATE / TIME: 202101221414
Unit Type and Rh: 6200

## 2019-12-01 NOTE — Progress Notes (Signed)
[facility]  [phone] [phone]  ID: Mariah Mcmahon OB: 08/07/1966  MR#: [id number]  CSN#:[id number]  Patient Care Team: Cannady, Jolene T, NP as PCP - General (Nurse Practitioner) Stanton, Kristi D, RN as Oncology Nurse Navigator   CHIEF COMPLAINT: Progressive stage IV cervical cancer  INTERVAL HISTORY: Patient returns to clinic today for further evaluation.  Her constipation has resolved and she feels significantly improved.  She denies any further abdominal pain and her appetite has improved.  She continues to have vaginal bleeding, but just initiated XRT this week.  She has no neurologic complaints.  She denies any recent fevers or illnesses.  She has no chest pain, shortness of breath, cough, or hemoptysis.  She denies any nausea, vomiting, constipation, or diarrhea.  She has no urinary complaints.  Patient offers no further specific complaints today.  REVIEW OF SYSTEMS:   Review of Systems  Constitutional: Negative.  Negative for fever, malaise/fatigue and weight loss.  Respiratory: Negative.  Negative for cough and shortness of breath.   Cardiovascular: Negative.  Negative for chest pain and leg swelling.  Gastrointestinal: Negative.  Negative for abdominal pain and constipation.  Genitourinary: Negative.  Negative for flank pain and hematuria.       Vaginal bleeding.  Musculoskeletal: Negative for back pain.  Skin: Negative.  Negative for rash.  Neurological: Negative.  Negative for dizziness, focal weakness, weakness and headaches.  Psychiatric/Behavioral: Negative.  The patient is not nervous/anxious and does not have insomnia.     As per HPI. Otherwise, a complete review of systems is negative.  PAST MEDICAL HISTORY: Past Medical History:  Diagnosis Date  . Anemia   . Cancer (HCC)    cwervical cancer  . Cervical cancer (HCC)   . No pertinent past medical history     PAST SURGICAL HISTORY: Past Surgical History:  Procedure  Laterality Date  . CYSTOSCOPY W/ URETERAL STENT PLACEMENT Right 10/02/2019   Procedure: CYSTOSCOPY WITH RETROGRADE PYELOGRAM/URETERAL STENT PLACEMENT;  Surgeon: Brandon, Ashley, MD;  Location: [facility] ORS;  Service: Urology;  Laterality: Right;  . IVC FILTER REMOVAL N/A 08/07/2019   Procedure: IVC FILTER REMOVAL;  Surgeon: Schnier, Gregory G, MD;  Location: [facility] INVASIVE CV LAB;  Service: Cardiovascular;  Laterality: N/A;  . MASS EXCISION     Throat  . PERIPHERAL VASCULAR THROMBECTOMY Left 06/05/2019   Procedure: PERIPHERAL VASCULAR THROMBECTOMY WITH IVC FILTER (LEFT LOWER EXTREMITY);  Surgeon: Schnier, Gregory G, MD;  Location: [facility] INVASIVE CV LAB;  Service: Cardiovascular;  Laterality: Left;  . PORTA CATH INSERTION N/A 06/06/2019   Procedure: PORTA CATH INSERTION;  Surgeon: Schnier, Gregory G, MD;  Location: [facility] INVASIVE CV LAB;  Service: Cardiovascular;  Laterality: N/A;  . TONSILLECTOMY      FAMILY HISTORY: Family History  Problem Relation Age of Onset  . Brain cancer Mother 32  . Alcohol abuse Father   . Heart disease Father   . Prostate cancer Father   . Heart attack Father   . Diabetes Father     ADVANCED DIRECTIVES (Y/N):  N  HEALTH MAINTENANCE: Social History   Tobacco Use  . Smoking status: Former Smoker    Packs/day: 0.50    Years: 30.00    Pack years: 15.00    Types: Cigarettes    Quit date: 07/03/2019    Years since quitting: 0.4  . Smokeless tobacco: Never Used  Substance Use Topics  . Alcohol use: Not Currently  . Drug use: Never     Colonoscopy:    PAP:  Bone density:  Lipid panel:  No Known Allergies  Current Outpatient Medications  Medication Sig Dispense Refill  . ALPRAZolam (XANAX) 0.25 MG tablet Take 1 tablet (0.25 mg total) by mouth 2 (two) times daily as needed for anxiety. (Patient taking differently: Take 0.25 mg by mouth 2 (two) times daily as needed for anxiety. Rarely takes) 45 tablet 0  . apixaban (ELIQUIS) 5 MG TABS tablet Take 1 tablet (5  mg total) by mouth 2 (two) times daily. 180 tablet 1  . dexamethasone (DECADRON) 4 MG tablet Take 1 tablet (4 mg total) by mouth daily. 14 tablet 0  . lactulose (CHRONULAC) 10 GM/15ML solution Take 15-30 mLs (10-20 g total) by mouth daily as needed (constipation). (Patient taking differently: Take 10-20 g by mouth 2 (two) times daily. ) 236 mL 0  . mirtazapine (REMERON) 7.5 MG tablet Take 1 tablet (7.5 mg total) by mouth at bedtime. 30 tablet 2  . ondansetron (ZOFRAN) 8 MG tablet Take 1 tablet (8 mg total) by mouth every 8 (eight) hours as needed for nausea or vomiting. 45 tablet 0  . oxybutynin (DITROPAN-XL) 5 MG 24 hr tablet Take 1 tablet (5 mg total) by mouth at bedtime. TAKE 1 TABLET BY MOUTH EVERYDAY AT BEDTIME 30 tablet 0  . oxyCODONE (OXY IR/ROXICODONE) 5 MG immediate release tablet Take 1-2 tablets (5-10 mg total) by mouth every 4 (four) hours as needed for severe pain. 60 tablet 0  . sodium phosphate (FLEET) 7-19 GM/118ML ENEM Place 133 mLs (1 enema total) rectally daily as needed for severe constipation. 2 enema 0  . tamsulosin (FLOMAX) 0.4 MG CAPS capsule TAKE 1 CAPSULE BY MOUTH EVERY DAY 30 capsule 1  . morphine (MS CONTIN) 15 MG 12 hr tablet Take 1 tablet (15 mg total) by mouth every 8 (eight) hours. 90 tablet 0   No current facility-administered medications for this visit.   Facility-Administered Medications Ordered in Other Visits  Medication Dose Route Frequency Provider Last Rate Last Admin  . heparin lock flush 100 unit/mL  500 Units Intracatheter Once PRN Finnegan, Timothy J, MD      . sodium chloride flush (NS) 0.9 % injection 10 mL  10 mL Intravenous PRN Finnegan, Timothy J, MD   10 mL at 11/27/19 1028    OBJECTIVE: Vitals:   12/05/19 0901  BP: 97/67  Pulse: (!) 113  Temp: 98.4 F (36.9 C)     Body mass index is 19.61 kg/m.    ECOG FS:0 - Asymptomatic  General: Thin, no acute distress. Eyes: Pink conjunctiva, anicteric sclera. HEENT: Normocephalic, moist mucous  membranes. Lungs: No audible wheezing or coughing. Heart: Tachycardic. Abdomen: Soft, nontender, no obvious distention. Musculoskeletal: No edema, cyanosis, or clubbing. Neuro: Alert, answering all questions appropriately. Cranial nerves grossly intact. Skin: No rashes or petechiae noted. Psych: Normal affect.   LAB RESULTS:  Lab Results  Component Value Date   NA 134 (L) 12/05/2019   K 3.5 12/05/2019   CL 101 12/05/2019   CO2 26 12/05/2019   GLUCOSE 141 (H) 12/05/2019   BUN 8 12/05/2019   CREATININE 0.96 12/05/2019   CALCIUM 9.4 12/05/2019   PROT 6.7 12/05/2019   ALBUMIN 3.0 (L) 12/05/2019   AST 25 12/05/2019   ALT 10 12/05/2019   ALKPHOS 169 (H) 12/05/2019   BILITOT 0.6 12/05/2019   GFRNONAA >60 12/05/2019   GFRAA >60 12/05/2019    Lab Results  Component Value Date   WBC 6.7 12/05/2019   NEUTROABS 4.9   12/05/2019   HGB 10.2 (L) 12/05/2019   HCT 32.5 (L) 12/05/2019   MCV 95.0 12/05/2019   PLT 306 12/05/2019     STUDIES: No results found.  ASSESSMENT: Progressive stage IV cervical cancer.  PLAN:    1.  Progressive stage IV cervical cancer: Biopsy results from April 24, 2019 confirmed the diagnosis. PET scan on September 17, 2019 revealed progressive disease.  Initial plan was to enroll in clinical trial at [facility], but secondary to significant urine protein patient did not qualify.  PD-L1 was found to be positive (3%, CPS is greater than 1).  Patient received cycle 2 of palliative Keytruda 2 weeks ago.  Return to clinic in 1 week for further evaluation and consideration of cycle 3.  2.  Hydronephrosis: Imaging as above.  Ureteral stent appears to be appropriately in place. 3.  Venous access: Patient now has had a port placed. 4.  Renal insufficiency: Resolved.   5.  Anemia: Despite heavy vaginal bleeding, patient's hemoglobin is stable at 10.2.   6.  Abdominal/flank pain: Resolved.  Possibly secondary to severe constipation.  Continue current narcotics as  prescribed.  Appreciate palliative care input. 7.  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  She will require Eliquis for minimum 6 months, but she will likely require extended treatment.  Patient's IVC filter has been removed.  8.  Thrombocytopenia: Resolved. 9.  Anxiety: Continue Xanax as prescribed. 10.  Poor appetite/insomnia: Continue Remeron. 11.  Vaginal bleeding: Difficult situation given patient is on Eliquis for her pulmonary embolism.  Continue daily XRT as scheduled. 12.  Constipation: Resolved.  Continue current bowel regimen.  Patient expressed understanding and was in agreement with this plan. She also understands that She can call clinic at any time with any questions, concerns, or complaints.   Cancer Staging Cervical cancer [facility]) Staging form: Cervix Uteri, AJCC 8th Edition - Clinical stage from 11/17/2019: FIGO Stage IVB (cT3b, cN1, pM1) - Signed by Lloyd Huger, MD on 11/17/2019   Lloyd Huger, MD   12/06/2019 10:32 AM

## 2019-12-03 DIAGNOSIS — C539 Malignant neoplasm of cervix uteri, unspecified: Secondary | ICD-10-CM | POA: Diagnosis not present

## 2019-12-03 DIAGNOSIS — Z510 Encounter for antineoplastic radiation therapy: Secondary | ICD-10-CM | POA: Diagnosis not present

## 2019-12-04 ENCOUNTER — Other Ambulatory Visit: Payer: Self-pay

## 2019-12-04 ENCOUNTER — Ambulatory Visit: Payer: BC Managed Care – PPO

## 2019-12-04 ENCOUNTER — Encounter: Payer: Self-pay | Admitting: Obstetrics and Gynecology

## 2019-12-04 ENCOUNTER — Ambulatory Visit
Admission: RE | Admit: 2019-12-04 | Discharge: 2019-12-04 | Disposition: A | Discharge status: Home / Self Care | Payer: BC Managed Care – PPO | Source: Ambulatory Visit | Attending: Radiation Oncology | Admitting: Radiation Oncology

## 2019-12-04 ENCOUNTER — Ambulatory Visit: Payer: BC Managed Care – PPO | Admitting: Oncology

## 2019-12-04 ENCOUNTER — Telehealth: Payer: Self-pay

## 2019-12-04 ENCOUNTER — Other Ambulatory Visit: Payer: BC Managed Care – PPO

## 2019-12-04 DIAGNOSIS — C539 Malignant neoplasm of cervix uteri, unspecified: Secondary | ICD-10-CM | POA: Diagnosis not present

## 2019-12-04 DIAGNOSIS — Z510 Encounter for antineoplastic radiation therapy: Secondary | ICD-10-CM | POA: Diagnosis not present

## 2019-12-04 NOTE — Progress Notes (Signed)
Patient pre screened for office appointment, no questions or concerns today. Patient reminded of upcoming appointment time and date. 

## 2019-12-04 NOTE — Telephone Encounter (Addendum)
Received [employer] CDx report from [place]. Sent to HIM for scanning.

## 2019-12-05 ENCOUNTER — Inpatient Hospital Stay: Payer: BC Managed Care – PPO

## 2019-12-05 ENCOUNTER — Inpatient Hospital Stay (HOSPITAL_BASED_OUTPATIENT_CLINIC_OR_DEPARTMENT_OTHER): Payer: BC Managed Care – PPO | Admitting: Oncology

## 2019-12-05 ENCOUNTER — Other Ambulatory Visit: Payer: Self-pay | Admitting: Nurse Practitioner

## 2019-12-05 ENCOUNTER — Ambulatory Visit
Admission: RE | Admit: 2019-12-05 | Discharge: 2019-12-05 | Disposition: A | Discharge status: Home / Self Care | Payer: BC Managed Care – PPO | Source: Ambulatory Visit | Attending: Radiation Oncology | Admitting: Radiation Oncology

## 2019-12-05 ENCOUNTER — Other Ambulatory Visit: Payer: Self-pay

## 2019-12-05 ENCOUNTER — Encounter: Payer: Self-pay | Admitting: Oncology

## 2019-12-05 VITALS — BP 97/67 | HR 113 | Temp 98.4°F | Wt 107.2 lb

## 2019-12-05 DIAGNOSIS — Z510 Encounter for antineoplastic radiation therapy: Secondary | ICD-10-CM | POA: Diagnosis not present

## 2019-12-05 DIAGNOSIS — C7911 Secondary malignant neoplasm of bladder: Secondary | ICD-10-CM | POA: Diagnosis not present

## 2019-12-05 DIAGNOSIS — F419 Anxiety disorder, unspecified: Secondary | ICD-10-CM | POA: Diagnosis not present

## 2019-12-05 DIAGNOSIS — Z5112 Encounter for antineoplastic immunotherapy: Secondary | ICD-10-CM | POA: Diagnosis not present

## 2019-12-05 DIAGNOSIS — N939 Abnormal uterine and vaginal bleeding, unspecified: Secondary | ICD-10-CM

## 2019-12-05 DIAGNOSIS — E860 Dehydration: Secondary | ICD-10-CM | POA: Diagnosis not present

## 2019-12-05 DIAGNOSIS — R630 Anorexia: Secondary | ICD-10-CM | POA: Diagnosis not present

## 2019-12-05 DIAGNOSIS — C778 Secondary and unspecified malignant neoplasm of lymph nodes of multiple regions: Secondary | ICD-10-CM | POA: Diagnosis not present

## 2019-12-05 DIAGNOSIS — R140 Abdominal distension (gaseous): Secondary | ICD-10-CM | POA: Diagnosis not present

## 2019-12-05 DIAGNOSIS — K5900 Constipation, unspecified: Secondary | ICD-10-CM | POA: Diagnosis not present

## 2019-12-05 DIAGNOSIS — Z515 Encounter for palliative care: Secondary | ICD-10-CM | POA: Diagnosis not present

## 2019-12-05 DIAGNOSIS — C530 Malignant neoplasm of endocervix: Secondary | ICD-10-CM | POA: Diagnosis not present

## 2019-12-05 DIAGNOSIS — G4700 Insomnia, unspecified: Secondary | ICD-10-CM | POA: Diagnosis not present

## 2019-12-05 DIAGNOSIS — C538 Malignant neoplasm of overlapping sites of cervix uteri: Secondary | ICD-10-CM

## 2019-12-05 DIAGNOSIS — C786 Secondary malignant neoplasm of retroperitoneum and peritoneum: Secondary | ICD-10-CM | POA: Diagnosis not present

## 2019-12-05 DIAGNOSIS — G893 Neoplasm related pain (acute) (chronic): Secondary | ICD-10-CM | POA: Diagnosis not present

## 2019-12-05 DIAGNOSIS — N938 Other specified abnormal uterine and vaginal bleeding: Secondary | ICD-10-CM | POA: Diagnosis not present

## 2019-12-05 DIAGNOSIS — C539 Malignant neoplasm of cervix uteri, unspecified: Secondary | ICD-10-CM | POA: Diagnosis not present

## 2019-12-05 DIAGNOSIS — D500 Iron deficiency anemia secondary to blood loss (chronic): Secondary | ICD-10-CM | POA: Diagnosis not present

## 2019-12-05 DIAGNOSIS — Z79899 Other long term (current) drug therapy: Secondary | ICD-10-CM | POA: Diagnosis not present

## 2019-12-05 LAB — CBC WITH DIFFERENTIAL/PLATELET
Abs Immature Granulocytes: 0.01 10*3/uL (ref 0.00–0.07)
Basophils Absolute: 0 10*3/uL (ref 0.0–0.1)
Basophils Relative: 1 %
Eosinophils Absolute: 0.1 10*3/uL (ref 0.0–0.5)
Eosinophils Relative: 2 %
HCT: 32.5 % — ABNORMAL LOW (ref 36.0–46.0)
Hemoglobin: 10.2 g/dL — ABNORMAL LOW (ref 12.0–15.0)
Immature Granulocytes: 0 %
Lymphocytes Relative: 10 %
Lymphs Abs: 0.6 10*3/uL — ABNORMAL LOW (ref 0.7–4.0)
MCH: 29.8 pg (ref 26.0–34.0)
MCHC: 31.4 g/dL (ref 30.0–36.0)
MCV: 95 fL (ref 80.0–100.0)
Monocytes Absolute: 1 10*3/uL (ref 0.1–1.0)
Monocytes Relative: 14 %
Neutro Abs: 4.9 10*3/uL (ref 1.7–7.7)
Neutrophils Relative %: 73 %
Platelets: 306 10*3/uL (ref 150–400)
RBC: 3.42 MIL/uL — ABNORMAL LOW (ref 3.87–5.11)
RDW: 17.2 % — ABNORMAL HIGH (ref 11.5–15.5)
WBC: 6.7 10*3/uL (ref 4.0–10.5)
nRBC: 0 % (ref 0.0–0.2)

## 2019-12-05 LAB — COMPREHENSIVE METABOLIC PANEL
ALT: 10 U/L (ref 0–44)
AST: 25 U/L (ref 15–41)
Albumin: 3 g/dL — ABNORMAL LOW (ref 3.5–5.0)
Alkaline Phosphatase: 169 U/L — ABNORMAL HIGH (ref 38–126)
Anion gap: 7 (ref 5–15)
BUN: 8 mg/dL (ref 6–20)
CO2: 26 mmol/L (ref 22–32)
Calcium: 9.4 mg/dL (ref 8.9–10.3)
Chloride: 101 mmol/L (ref 98–111)
Creatinine, Ser: 0.96 mg/dL (ref 0.44–1.00)
GFR calc Af Amer: >60 mL/min (ref >60)
GFR calc non Af Amer: >60 mL/min (ref >60)
Glucose, Bld: 141 mg/dL — ABNORMAL HIGH (ref 70–99)
Potassium: 3.5 mmol/L (ref 3.5–5.1)
Sodium: 134 mmol/L — ABNORMAL LOW (ref 135–145)
Total Bilirubin: 0.6 mg/dL (ref 0.3–1.2)
Total Protein: 6.7 g/dL (ref 6.5–8.1)

## 2019-12-05 MED ORDER — SODIUM CHLORIDE 0.9% FLUSH
10.0000 mL | Freq: Once | INTRAVENOUS | Status: AC
  Administered 2019-12-05: 10 mL via INTRAVENOUS
  Filled 2019-12-05: qty 10

## 2019-12-05 MED ORDER — HEPARIN SOD (PORK) LOCK FLUSH 100 UNIT/ML IV SOLN
500.0000 [IU] | Freq: Once | INTRAVENOUS | Status: AC
  Administered 2019-12-05: 500 [IU] via INTRAVENOUS
  Filled 2019-12-05: qty 5

## 2019-12-05 MED ORDER — HEPARIN SOD (PORK) LOCK FLUSH 100 UNIT/ML IV SOLN
500.0000 [IU] | Freq: Once | INTRAVENOUS | Status: AC | PRN
  Filled 2019-12-05: qty 5

## 2019-12-05 MED ORDER — MORPHINE SULFATE ER 15 MG PO TBCR: 15.0000 mg | EXTENDED_RELEASE_TABLET | Freq: Three times a day (TID) | ORAL | 0 refills | Status: DC

## 2019-12-06 ENCOUNTER — Other Ambulatory Visit: Payer: Self-pay | Admitting: Nurse Practitioner

## 2019-12-06 ENCOUNTER — Other Ambulatory Visit: Payer: Self-pay | Admitting: Emergency Medicine

## 2019-12-06 ENCOUNTER — Other Ambulatory Visit: Payer: Self-pay | Admitting: Oncology

## 2019-12-06 ENCOUNTER — Telehealth: Payer: Self-pay

## 2019-12-06 ENCOUNTER — Other Ambulatory Visit: Payer: Self-pay

## 2019-12-06 ENCOUNTER — Ambulatory Visit
Admission: RE | Admit: 2019-12-06 | Discharge: 2019-12-06 | Disposition: A | Discharge status: Home / Self Care | Payer: BC Managed Care – PPO | Source: Ambulatory Visit | Attending: Radiation Oncology | Admitting: Radiation Oncology

## 2019-12-06 DIAGNOSIS — C539 Malignant neoplasm of cervix uteri, unspecified: Secondary | ICD-10-CM

## 2019-12-06 DIAGNOSIS — Z510 Encounter for antineoplastic radiation therapy: Secondary | ICD-10-CM | POA: Diagnosis not present

## 2019-12-06 MED ORDER — MORPHINE SULFATE ER 15 MG PO TBCR: 15.0000 mg | EXTENDED_RELEASE_TABLET | Freq: Three times a day (TID) | ORAL | 0 refills | Status: DC

## 2019-12-06 NOTE — Telephone Encounter (Signed)
Called pharmacy & confirmed that they have received medication. They had, it has been filled, and patient has picked up.

## 2019-12-06 NOTE — Telephone Encounter (Signed)
Updated insurance information sent to [employer].

## 2019-12-07 ENCOUNTER — Encounter: Payer: Self-pay | Admitting: Oncology

## 2019-12-07 ENCOUNTER — Ambulatory Visit
Admission: RE | Admit: 2019-12-07 | Discharge: 2019-12-07 | Disposition: A | Discharge status: Home / Self Care | Payer: BC Managed Care – PPO | Source: Ambulatory Visit | Attending: Radiation Oncology | Admitting: Radiation Oncology

## 2019-12-07 ENCOUNTER — Other Ambulatory Visit: Payer: Self-pay | Admitting: Hospice and Palliative Medicine

## 2019-12-07 ENCOUNTER — Other Ambulatory Visit: Payer: Self-pay | Admitting: Emergency Medicine

## 2019-12-07 ENCOUNTER — Other Ambulatory Visit: Payer: Self-pay

## 2019-12-07 DIAGNOSIS — C539 Malignant neoplasm of cervix uteri, unspecified: Secondary | ICD-10-CM | POA: Diagnosis not present

## 2019-12-07 DIAGNOSIS — Z510 Encounter for antineoplastic radiation therapy: Secondary | ICD-10-CM | POA: Diagnosis not present

## 2019-12-07 MED ORDER — OXYCODONE HCL 5 MG PO TABS: 5.0000 mg | ORAL_TABLET | ORAL | 0 refills | Status: DC | PRN

## 2019-12-07 NOTE — Progress Notes (Signed)
Mariah Mcmahon  [phone] [phone]  ID: Mariah Mcmahon OB: [birth year]  MR#: [id number]  TKW#:[id number]  Patient Care Team: Venita Lick, NP as PCP - General (Nurse Practitioner) Clent Jacks, RN as Oncology Nurse Navigator   CHIEF COMPLAINT: Progressive stage IV cervical cancer  INTERVAL HISTORY: Patient returns to clinic today for further evaluation and consideration of cycle 3 of Keytruda.  She feels significantly worse today.  She has continued vaginal bleeding and was using approximately 1 pad per hour overnight.  She has worsening weakness and fatigue.  She also has persistent nausea and pain. She has no neurologic complaints.  She denies any recent fevers or illnesses.  She has no chest pain, shortness of breath, cough, or hemoptysis.  She denies any nausea, vomiting, constipation, or diarrhea.  She has no urinary complaints.  Patient feels generally terrible, but offers no further specific complaints today.  REVIEW OF SYSTEMS:   Review of Systems  Constitutional: Positive for malaise/fatigue. Negative for fever and weight loss.  Respiratory: Negative.  Negative for cough and shortness of breath.   Cardiovascular: Negative.  Negative for chest pain and leg swelling.  Gastrointestinal: Positive for nausea. Negative for abdominal pain and constipation.  Genitourinary: Negative.  Negative for flank pain and hematuria.       Vaginal bleeding.  Musculoskeletal: Negative for back pain.  Skin: Negative.  Negative for rash.  Neurological: Positive for weakness. Negative for dizziness, focal weakness and headaches.  Psychiatric/Behavioral: The patient is nervous/anxious. The patient does not have insomnia.     As per HPI. Otherwise, a complete review of systems is negative.  PAST MEDICAL HISTORY: Past Medical History:  Diagnosis Date  . Anemia   . Cancer ([place])    cwervical cancer  . Cervical cancer ([place])   . No pertinent past medical  history     PAST SURGICAL HISTORY: Past Surgical History:  Procedure Laterality Date  . CYSTOSCOPY W/ URETERAL STENT PLACEMENT Right 10/02/2019   Procedure: CYSTOSCOPY WITH RETROGRADE PYELOGRAM/URETERAL STENT PLACEMENT;  Surgeon: Hollice Espy, MD;  Location: [facility] ORS;  Service: Urology;  Laterality: Right;  . IVC FILTER REMOVAL N/A 08/07/2019   Procedure: IVC FILTER REMOVAL;  Surgeon: Katha Cabal, MD;  Location: [place] [facility];  Service: Cardiovascular;  Laterality: N/A;  . MASS EXCISION     Throat  . PERIPHERAL VASCULAR THROMBECTOMY Left 06/05/2019   Procedure: PERIPHERAL VASCULAR THROMBECTOMY WITH IVC FILTER (LEFT LOWER EXTREMITY);  Surgeon: Katha Cabal, MD;  Location: [place] [facility];  Service: Cardiovascular;  Laterality: Left;  . PORTA CATH INSERTION N/A 06/06/2019   Procedure: PORTA CATH INSERTION;  Surgeon: Katha Cabal, MD;  Location: [place] CV [facility];  Service: Cardiovascular;  Laterality: N/A;  . TONSILLECTOMY      FAMILY HISTORY: Family History  Problem Relation Age of Onset  . Brain cancer Mother 46  . Alcohol abuse Father   . Heart disease Father   . Prostate cancer Father   . Heart attack Father   . Diabetes Father     ADVANCED DIRECTIVES (Y/N):  N  HEALTH MAINTENANCE: Social History   Tobacco Use  . Smoking status: Former Smoker    Packs/day: 0.50    Years: 30.00    Pack years: 15.00    Types: Cigarettes    Quit date: 07/03/2019    Years since quitting: 0.4  . Smokeless tobacco: Never Used  Substance Use Topics  . Alcohol use: Not Currently  .  Drug use: Never     Colonoscopy:  PAP:  Bone density:  Lipid panel:  No Known Allergies  Current Outpatient Medications  Medication Sig Dispense Refill  . ALPRAZolam (XANAX) 0.25 MG tablet Take 1 tablet (0.25 mg total) by mouth 2 (two) times daily as needed for anxiety. (Patient taking differently: Take 0.25 mg by mouth 2 (two) times daily as needed for anxiety. Rarely  takes) 45 tablet 0  . apixaban (ELIQUIS) 5 MG TABS tablet Take 1 tablet (5 mg total) by mouth 2 (two) times daily. 180 tablet 1  . dexamethasone (DECADRON) 4 MG tablet Take 1 tablet (4 mg total) by mouth daily. 14 tablet 0  . lactulose (CHRONULAC) 10 GM/15ML solution Take 15-30 mLs (10-20 g total) by mouth daily as needed (constipation). (Patient taking differently: Take 10-20 g by mouth 2 (two) times daily. ) 236 mL 0  . mirtazapine (REMERON) 7.5 MG tablet Take 1 tablet (7.5 mg total) by mouth at bedtime. 30 tablet 2  . morphine (MS CONTIN) 15 MG 12 hr tablet Take 1 tablet (15 mg total) by mouth every 8 (eight) hours. 90 tablet 0  . ondansetron (ZOFRAN) 8 MG tablet Take 1 tablet (8 mg total) by mouth every 8 (eight) hours as needed for nausea or vomiting. 45 tablet 0  . oxybutynin (DITROPAN-XL) 5 MG 24 hr tablet TAKE 1 TABLET (5 MG TOTAL) BY MOUTH AT BEDTIME 30 tablet 0  . oxyCODONE (OXY IR/ROXICODONE) 5 MG immediate release tablet Take 1-2 tablets (5-10 mg total) by mouth every 4 (four) hours as needed for severe pain. 60 tablet 0  . promethazine (PHENERGAN) 25 MG tablet Take 1 tablet (25 mg total) by mouth every 6 (six) hours as needed for nausea or vomiting. 30 tablet 0  . sodium phosphate (FLEET) 7-19 GM/118ML ENEM Place 133 mLs (1 enema total) rectally daily as needed for severe constipation. 2 enema 0  . tamsulosin (FLOMAX) 0.4 MG CAPS capsule TAKE 1 CAPSULE BY MOUTH EVERY DAY 30 capsule 1   No current facility-administered medications for this visit.   Facility-Administered Medications Ordered in Other Visits  Medication Dose Route Frequency Provider Last Rate Last Admin  . heparin lock flush 100 unit/mL  500 Units Intracatheter Once PRN Lloyd Huger, MD      . sodium chloride flush (NS) 0.9 % injection 10 mL  10 mL Intravenous PRN Lloyd Huger, MD   10 mL at 11/27/19 1028    OBJECTIVE: Vitals:   12/11/19 1025  BP: 96/63  Pulse: (!) 108  Resp: 18  SpO2: 98%     Body  mass index is 18.89 kg/m.    ECOG FS:0 - Asymptomatic  General: Thin, no acute distress. Eyes: Pink conjunctiva, anicteric sclera. HEENT: Normocephalic, moist mucous membranes. Lungs: No audible wheezing or coughing. Heart: Regular rate and rhythm. Abdomen: Soft, nontender, no obvious distention. Musculoskeletal: No edema, cyanosis, or clubbing. Neuro: Alert, answering all questions appropriately. Cranial nerves grossly intact. Skin: No rashes or petechiae noted. Psych: Normal affect.    LAB RESULTS:  Lab Results  Component Value Date   NA 134 (L) 12/11/2019   K 3.7 12/11/2019   CL 97 (L) 12/11/2019   CO2 27 12/11/2019   GLUCOSE 134 (H) 12/11/2019   BUN 10 12/11/2019   CREATININE 0.88 12/11/2019   CALCIUM 10.4 (H) 12/11/2019   PROT 6.4 (L) 12/11/2019   ALBUMIN 2.9 (L) 12/11/2019   AST 16 12/11/2019   ALT 7 12/11/2019   ALKPHOS  132 (H) 12/11/2019   BILITOT 0.6 12/11/2019   GFRNONAA >60 12/11/2019   GFRAA >60 12/11/2019    Lab Results  Component Value Date   WBC 5.9 12/11/2019   NEUTROABS 4.8 12/11/2019   HGB 7.8 (L) 12/11/2019   HCT 25.4 (L) 12/11/2019   MCV 95.1 12/11/2019   PLT 344 12/11/2019     STUDIES: No results found.  ASSESSMENT: Progressive stage IV cervical cancer.  PLAN:    1.  Progressive stage IV cervical cancer: Biopsy results from April 24, 2019 confirmed the diagnosis. PET scan on September 17, 2019 revealed progressive disease.  Initial plan was to enroll in clinical trial at [facility], but secondary to significant urine protein patient did not qualify.  PD-L1 was found to be positive (3%, CPS is greater than 1).  Proceed with cycle 3 of Keytruda today.  Return to clinic in 3 weeks for further evaluation and consideration of cycle 4. 2.  Hydronephrosis: Imaging as above.  Ureteral stent appears to be appropriately in place. 3.  Venous access: Patient now has had a port placed. 4.  Renal insufficiency: Resolved.   5.  Anemia: Patient's  hemoglobin has significantly dropped over the past several days from 10.2-7.8 and she is actively bleeding.  Return to clinic tomorrow for 2 units of packed red blood cells.  Continue daily XRT.  Patient also was inquiring about palliative hysterectomy if XRT does not work. 6.  Abdominal/flank pain: Continue current narcotics as prescribed.  Appreciate palliative care input. 7.  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  She will require Eliquis for minimum 6 months, but she will likely require extended treatment.  Patient's IVC filter has been removed.  Given patient's extensive bleeding, will have to consider risk-benefit of additional Eliquis. 8.  Thrombocytopenia: Resolved. 9.  Anxiety: Continue Xanax as prescribed. 10.  Poor appetite/insomnia: Continue Remeron. 11.  Vaginal bleeding: XRT as above.  2 units of packed red blood cells tomorrow. 12.  Constipation: Resolved.  Continue current bowel regimen.  Patient expressed understanding and was in agreement with this plan. She also understands that She can call clinic at any time with any questions, concerns, or complaints.   Cancer Staging Cervical cancer [facility]) Staging form: Cervix Uteri, AJCC 8th Edition - Clinical stage from 11/17/2019: FIGO Stage IVB (cT3b, cN1, pM1) - Signed by Lloyd Huger, MD on 11/17/2019   Lloyd Huger, MD   12/11/2019 1:46 PM

## 2019-12-07 NOTE — Progress Notes (Signed)
I spoke with patient. She reports pain is improved on current regimen of MS Contin/oxycodone. She needs refill of oxycodone. PDMP reviewed.

## 2019-12-10 ENCOUNTER — Ambulatory Visit
Admission: RE | Admit: 2019-12-10 | Discharge: 2019-12-10 | Disposition: A | Discharge status: Home / Self Care | Payer: BC Managed Care – PPO | Source: Ambulatory Visit | Attending: Radiation Oncology | Admitting: Radiation Oncology

## 2019-12-10 ENCOUNTER — Encounter: Payer: Self-pay | Admitting: Oncology

## 2019-12-10 ENCOUNTER — Other Ambulatory Visit: Payer: Self-pay | Admitting: Oncology

## 2019-12-10 ENCOUNTER — Other Ambulatory Visit: Payer: Self-pay

## 2019-12-10 DIAGNOSIS — C539 Malignant neoplasm of cervix uteri, unspecified: Secondary | ICD-10-CM | POA: Diagnosis not present

## 2019-12-10 DIAGNOSIS — Z510 Encounter for antineoplastic radiation therapy: Secondary | ICD-10-CM | POA: Diagnosis not present

## 2019-12-10 DIAGNOSIS — N1330 Unspecified hydronephrosis: Secondary | ICD-10-CM

## 2019-12-10 NOTE — Progress Notes (Signed)
Patient here for follow up. Reports increase in bleeding, states she was going through 1 pad/hr last night. Endorses dizziness, nausea. Pain 7/10.

## 2019-12-11 ENCOUNTER — Inpatient Hospital Stay: Payer: BC Managed Care – PPO

## 2019-12-11 ENCOUNTER — Inpatient Hospital Stay: Payer: BC Managed Care – PPO | Attending: Oncology | Admitting: Oncology

## 2019-12-11 ENCOUNTER — Inpatient Hospital Stay: Payer: BC Managed Care – PPO | Admitting: *Deleted

## 2019-12-11 ENCOUNTER — Ambulatory Visit
Admission: RE | Admit: 2019-12-11 | Discharge: 2019-12-11 | Disposition: A | Discharge status: Home / Self Care | Payer: BC Managed Care – PPO | Source: Ambulatory Visit | Attending: Radiation Oncology | Admitting: Radiation Oncology

## 2019-12-11 ENCOUNTER — Inpatient Hospital Stay (HOSPITAL_BASED_OUTPATIENT_CLINIC_OR_DEPARTMENT_OTHER): Payer: BC Managed Care – PPO | Admitting: Hospice and Palliative Medicine

## 2019-12-11 ENCOUNTER — Other Ambulatory Visit: Payer: Self-pay

## 2019-12-11 VITALS — BP 96/63 | HR 108 | Resp 18 | Wt 103.3 lb

## 2019-12-11 VITALS — Temp 96.9°F

## 2019-12-11 DIAGNOSIS — N939 Abnormal uterine and vaginal bleeding, unspecified: Secondary | ICD-10-CM | POA: Diagnosis not present

## 2019-12-11 DIAGNOSIS — Z86718 Personal history of other venous thrombosis and embolism: Secondary | ICD-10-CM | POA: Diagnosis not present

## 2019-12-11 DIAGNOSIS — Z87891 Personal history of nicotine dependence: Secondary | ICD-10-CM | POA: Diagnosis not present

## 2019-12-11 DIAGNOSIS — R531 Weakness: Secondary | ICD-10-CM | POA: Insufficient documentation

## 2019-12-11 DIAGNOSIS — R5383 Other fatigue: Secondary | ICD-10-CM | POA: Insufficient documentation

## 2019-12-11 DIAGNOSIS — C538 Malignant neoplasm of overlapping sites of cervix uteri: Secondary | ICD-10-CM

## 2019-12-11 DIAGNOSIS — C539 Malignant neoplasm of cervix uteri, unspecified: Secondary | ICD-10-CM

## 2019-12-11 DIAGNOSIS — Z5112 Encounter for antineoplastic immunotherapy: Secondary | ICD-10-CM | POA: Insufficient documentation

## 2019-12-11 DIAGNOSIS — G893 Neoplasm related pain (acute) (chronic): Secondary | ICD-10-CM | POA: Insufficient documentation

## 2019-12-11 DIAGNOSIS — F419 Anxiety disorder, unspecified: Secondary | ICD-10-CM | POA: Insufficient documentation

## 2019-12-11 DIAGNOSIS — Z515 Encounter for palliative care: Secondary | ICD-10-CM | POA: Insufficient documentation

## 2019-12-11 DIAGNOSIS — C7951 Secondary malignant neoplasm of bone: Secondary | ICD-10-CM | POA: Diagnosis not present

## 2019-12-11 DIAGNOSIS — G4700 Insomnia, unspecified: Secondary | ICD-10-CM | POA: Insufficient documentation

## 2019-12-11 DIAGNOSIS — Z79899 Other long term (current) drug therapy: Secondary | ICD-10-CM | POA: Diagnosis not present

## 2019-12-11 DIAGNOSIS — C778 Secondary and unspecified malignant neoplasm of lymph nodes of multiple regions: Secondary | ICD-10-CM | POA: Diagnosis not present

## 2019-12-11 DIAGNOSIS — Z66 Do not resuscitate: Secondary | ICD-10-CM | POA: Diagnosis not present

## 2019-12-11 DIAGNOSIS — C7989 Secondary malignant neoplasm of other specified sites: Secondary | ICD-10-CM | POA: Diagnosis not present

## 2019-12-11 DIAGNOSIS — R300 Dysuria: Secondary | ICD-10-CM | POA: Diagnosis not present

## 2019-12-11 DIAGNOSIS — C7800 Secondary malignant neoplasm of unspecified lung: Secondary | ICD-10-CM | POA: Insufficient documentation

## 2019-12-11 DIAGNOSIS — Z7901 Long term (current) use of anticoagulants: Secondary | ICD-10-CM | POA: Insufficient documentation

## 2019-12-11 DIAGNOSIS — Z7952 Long term (current) use of systemic steroids: Secondary | ICD-10-CM | POA: Diagnosis not present

## 2019-12-11 DIAGNOSIS — Z510 Encounter for antineoplastic radiation therapy: Secondary | ICD-10-CM | POA: Diagnosis not present

## 2019-12-11 DIAGNOSIS — Z9221 Personal history of antineoplastic chemotherapy: Secondary | ICD-10-CM | POA: Insufficient documentation

## 2019-12-11 DIAGNOSIS — R630 Anorexia: Secondary | ICD-10-CM | POA: Diagnosis not present

## 2019-12-11 DIAGNOSIS — D500 Iron deficiency anemia secondary to blood loss (chronic): Secondary | ICD-10-CM | POA: Insufficient documentation

## 2019-12-11 DIAGNOSIS — N131 Hydronephrosis with ureteral stricture, not elsewhere classified: Secondary | ICD-10-CM | POA: Insufficient documentation

## 2019-12-11 DIAGNOSIS — R110 Nausea: Secondary | ICD-10-CM | POA: Insufficient documentation

## 2019-12-11 DIAGNOSIS — C530 Malignant neoplasm of endocervix: Secondary | ICD-10-CM | POA: Insufficient documentation

## 2019-12-11 DIAGNOSIS — Z95828 Presence of other vascular implants and grafts: Secondary | ICD-10-CM

## 2019-12-11 LAB — COMPREHENSIVE METABOLIC PANEL
ALT: 7 U/L (ref 0–44)
AST: 16 U/L (ref 15–41)
Albumin: 2.9 g/dL — ABNORMAL LOW (ref 3.5–5.0)
Alkaline Phosphatase: 132 U/L — ABNORMAL HIGH (ref 38–126)
Anion gap: 10 (ref 5–15)
BUN: 10 mg/dL (ref 6–20)
CO2: 27 mmol/L (ref 22–32)
Calcium: 10.4 mg/dL — ABNORMAL HIGH (ref 8.9–10.3)
Chloride: 97 mmol/L — ABNORMAL LOW (ref 98–111)
Creatinine, Ser: 0.88 mg/dL (ref 0.44–1.00)
GFR calc Af Amer: >60 mL/min (ref >60)
GFR calc non Af Amer: >60 mL/min (ref >60)
Glucose, Bld: 134 mg/dL — ABNORMAL HIGH (ref 70–99)
Potassium: 3.7 mmol/L (ref 3.5–5.1)
Sodium: 134 mmol/L — ABNORMAL LOW (ref 135–145)
Total Bilirubin: 0.6 mg/dL (ref 0.3–1.2)
Total Protein: 6.4 g/dL — ABNORMAL LOW (ref 6.5–8.1)

## 2019-12-11 LAB — CBC WITH DIFFERENTIAL/PLATELET
Abs Immature Granulocytes: 0.02 10*3/uL (ref 0.00–0.07)
Basophils Absolute: 0 10*3/uL (ref 0.0–0.1)
Basophils Relative: 1 %
Eosinophils Absolute: 0.1 10*3/uL (ref 0.0–0.5)
Eosinophils Relative: 1 %
HCT: 25.4 % — ABNORMAL LOW (ref 36.0–46.0)
Hemoglobin: 7.8 g/dL — ABNORMAL LOW (ref 12.0–15.0)
Immature Granulocytes: 0 %
Lymphocytes Relative: 5 %
Lymphs Abs: 0.3 10*3/uL — ABNORMAL LOW (ref 0.7–4.0)
MCH: 29.2 pg (ref 26.0–34.0)
MCHC: 30.7 g/dL (ref 30.0–36.0)
MCV: 95.1 fL (ref 80.0–100.0)
Monocytes Absolute: 0.7 10*3/uL (ref 0.1–1.0)
Monocytes Relative: 11 %
Neutro Abs: 4.8 10*3/uL (ref 1.7–7.7)
Neutrophils Relative %: 82 %
Platelets: 344 10*3/uL (ref 150–400)
RBC: 2.67 MIL/uL — ABNORMAL LOW (ref 3.87–5.11)
RDW: 17.6 % — ABNORMAL HIGH (ref 11.5–15.5)
WBC: 5.9 10*3/uL (ref 4.0–10.5)
nRBC: 0 % (ref 0.0–0.2)

## 2019-12-11 LAB — PREPARE RBC (CROSSMATCH)

## 2019-12-11 LAB — SAMPLE TO BLOOD BANK

## 2019-12-11 MED ORDER — HEPARIN SOD (PORK) LOCK FLUSH 100 UNIT/ML IV SOLN
500.0000 [IU] | Freq: Once | INTRAVENOUS | Status: AC
  Administered 2019-12-11: 500 [IU] via INTRAVENOUS
  Filled 2019-12-11: qty 5

## 2019-12-11 MED ORDER — PROMETHAZINE HCL 25 MG PO TABS: 25.0000 mg | ORAL_TABLET | Freq: Four times a day (QID) | ORAL | 0 refills | Status: AC | PRN

## 2019-12-11 MED ORDER — HEPARIN SOD (PORK) LOCK FLUSH 100 UNIT/ML IV SOLN
INTRAVENOUS | Status: AC
  Filled 2019-12-11: qty 5

## 2019-12-11 MED ORDER — SODIUM CHLORIDE 0.9 % IV SOLN
Freq: Once | INTRAVENOUS | Status: AC
  Filled 2019-12-11: qty 250

## 2019-12-11 MED ORDER — SODIUM CHLORIDE 0.9% FLUSH
10.0000 mL | Freq: Once | INTRAVENOUS | Status: AC
  Administered 2019-12-11: 10 mL via INTRAVENOUS
  Filled 2019-12-11: qty 10

## 2019-12-11 MED ORDER — SODIUM CHLORIDE 0.9 % IV SOLN
200.0000 mg | Freq: Once | INTRAVENOUS | Status: AC
  Administered 2019-12-11: 200 mg via INTRAVENOUS
  Filled 2019-12-11: qty 8

## 2019-12-11 NOTE — Progress Notes (Signed)
Blood Pressure: 96/63, Pulse Rate:108. Hemoglobin: 7.8. MD, Dr. Grayland Ormond, notified and already aware. Per MD order: proceed with scheduled Keytruda treatment today. Patient returning to clinic tomorrow for pRBCs transfusion.

## 2019-12-11 NOTE — Progress Notes (Signed)
Patient pre screened for office appointment, no questions or concerns today. Patient reminded of upcoming appointment time and date. 

## 2019-12-11 NOTE — Progress Notes (Signed)
Mariah Mcmahon  [phone] [phone]   Name: Mariah Mcmahon Date: 12/11/2019 MRN: [id number]  DOB: 1966-06-22  Patient Care Team: Venita Lick, NP as PCP - General (Nurse Practitioner) Clent Jacks, RN as Oncology Nurse Navigator    REASON FOR CONSULTATION: [name] is a 54 y.o. 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  Patient is felt not to be a surgical candidate and treatment was initiated with concurrent chemoradiation.  Patient was hospitalized 06/05/2019 -06/07/2019 with acute left lower extremity DVT/pulmonary embolism status post thrombolysis and IVC filter placement.  IVC filter was later removed.  Patient had disease progression on carbotaxol and was referred to [facility] for a clinical trial.  PET on 09/17/2019, which revealed interval progression of hypermetabolic nodal disease in chest, abdomen, and pelvis with new hypermetabolic disease in the region of the right vaginal fornix/cervix. Decision was made to proceed with systemic chemotherapy. CT scan on 12/21 revealed mixed response to treatment. Patient was rotated to [facility].  She has had vaginal bleeding and was started on RT.  Patient was referred to palliative care to help address goals and manage ongoing symptoms.  SOCIAL HISTORY:     reports that she quit smoking about 5 months ago. Her smoking use included cigarettes. She has a 15.00 pack-year smoking history. She has never used smokeless tobacco. She reports previous alcohol use. She reports that she does not use drugs.  Patient is not married.  She lives at home alone.  She has a son who is involved in her care, who lives nearby.  Patient previously worked at [employer] in [place] and recently stopped working due to [employer].  ADVANCE DIRECTIVES:  Does not have  CODE STATUS:   PAST MEDICAL  HISTORY: Past Medical History:  Diagnosis Date  . Anemia   . Cancer ([place])    cwervical cancer  . Cervical cancer ([place])   . No pertinent past medical history     PAST SURGICAL HISTORY:  Past Surgical History:  Procedure Laterality Date  . CYSTOSCOPY W/ URETERAL STENT PLACEMENT Right 10/02/2019   Procedure: CYSTOSCOPY WITH RETROGRADE PYELOGRAM/URETERAL STENT PLACEMENT;  Surgeon: Hollice Espy, MD;  Location: [facility] ORS;  Service: Urology;  Laterality: Right;  . IVC FILTER REMOVAL N/A 08/07/2019   Procedure: IVC FILTER REMOVAL;  Surgeon: Katha Cabal, MD;  Location: [place]  CV LAB;  Service: Cardiovascular;  Laterality: N/A;  . MASS EXCISION     Throat  . PERIPHERAL VASCULAR THROMBECTOMY Left 06/05/2019   Procedure: PERIPHERAL VASCULAR THROMBECTOMY WITH IVC FILTER (LEFT LOWER EXTREMITY);  Surgeon: Katha Cabal, MD;  Location: [place] CV LAB;  Service: Cardiovascular;  Laterality: Left;  . PORTA CATH INSERTION N/A 06/06/2019   Procedure: PORTA CATH INSERTION;  Surgeon: Katha Cabal, MD;  Location: [place] CV LAB;  Service: Cardiovascular;  Laterality: N/A;  . TONSILLECTOMY      HEMATOLOGY/ONCOLOGY HISTORY:  Oncology History  Cervical cancer ([place])  05/04/2019 Initial Diagnosis   Cervical cancer ([place])   05/17/2019 - 07/04/2019 Chemotherapy   The patient had palonosetron (ALOXI) injection 0.25 mg, 0.25 mg, Intravenous,  Once, 6 of 7 cycles Administration: 0.25 mg (05/17/2019), 0.25 mg (05/24/2019), 0.25 mg (05/31/2019), 0.25 mg (06/14/2019), 0.25 mg (06/22/2019), 0.25 mg (06/28/2019) CISplatin (PLATINOL) 61 mg in sodium chloride 0.9 % 250 mL chemo infusion, 40 mg/m2 = 61 mg, Intravenous,  Once, 6  of 7 cycles Administration: 61 mg (05/17/2019), 61 mg (05/24/2019), 61 mg (05/31/2019), 61 mg (06/14/2019), 61 mg (06/22/2019), 61 mg (06/28/2019) fosaprepitant (EMEND) 150 mg, dexamethasone (DECADRON) 12 mg in sodium chloride 0.9 % 145 mL IVPB, , Intravenous,  Once, 6 of 7  cycles Administration:  (05/17/2019),  (05/24/2019),  (05/31/2019),  (06/14/2019),  (06/22/2019),  (06/28/2019)  for chemotherapy treatment.    07/20/2019 - 09/20/2019 Chemotherapy   The patient had palonosetron (ALOXI) injection 0.25 mg, 0.25 mg, Intravenous,  Once, 3 of 6 cycles Administration: 0.25 mg (07/20/2019), 0.25 mg (08/10/2019), 0.25 mg (08/31/2019) pegfilgrastim (NEULASTA ONPRO KIT) injection 6 mg, 6 mg, Subcutaneous, Once, 2 of 5 cycles Administration: 6 mg (07/20/2019), 6 mg (08/31/2019) CARBOplatin (PARAPLATIN) 520 mg in sodium chloride 0.9 % 250 mL chemo infusion, 520 mg (95.1 % of original dose 541.8 mg), Intravenous,  Once, 3 of 6 cycles Dose modification:   (original dose 541.8 mg, Cycle 1) Administration: 520 mg (07/20/2019), 520 mg (08/10/2019), 520 mg (08/31/2019) PACLitaxel (TAXOL) 300 mg in sodium chloride 0.9 % 250 mL chemo infusion (> '80mg'$ /m2), 200 mg/m2 = 300 mg, Intravenous,  Once, 3 of 6 cycles Administration: 300 mg (07/20/2019), 300 mg (08/10/2019), 300 mg (08/31/2019) fosaprepitant (EMEND) 150 mg, dexamethasone (DECADRON) 12 mg in sodium chloride 0.9 % 145 mL IVPB, , Intravenous,  Once, 3 of 6 cycles Administration:  (07/20/2019),  (08/10/2019),  (08/31/2019)  for chemotherapy treatment.    10/30/2019 -  Chemotherapy   The patient had pembrolizumab (KEYTRUDA) 200 mg in sodium chloride 0.9 % 50 mL chemo infusion, 200 mg, Intravenous, Once, 3 of 6 cycles Administration: 200 mg (10/30/2019), 200 mg (12/11/2019), 200 mg (11/20/2019)  for chemotherapy treatment.    11/17/2019 Cancer Staging   Staging form: Cervix Uteri, AJCC 8th Edition - Clinical stage from 11/17/2019: FIGO Stage IVB (cT3b, cN1, pM1) - Signed by Lloyd Huger, MD on 11/17/2019     ALLERGIES:  has No Known Allergies.  MEDICATIONS:  Current Outpatient Medications  Medication Sig Dispense Refill  . ALPRAZolam (XANAX) 0.25 MG tablet Take 1 tablet (0.25 mg total) by mouth 2 (two) times daily as needed for anxiety.  (Patient taking differently: Take 0.25 mg by mouth 2 (two) times daily as needed for anxiety. Rarely takes) 45 tablet 0  . apixaban (ELIQUIS) 5 MG TABS tablet Take 1 tablet (5 mg total) by mouth 2 (two) times daily. 180 tablet 1  . dexamethasone (DECADRON) 4 MG tablet Take 1 tablet (4 mg total) by mouth daily. 14 tablet 0  . lactulose (CHRONULAC) 10 GM/15ML solution Take 15-30 mLs (10-20 g total) by mouth daily as needed (constipation). (Patient taking differently: Take 10-20 g by mouth 2 (two) times daily. ) 236 mL 0  . mirtazapine (REMERON) 7.5 MG tablet Take 1 tablet (7.5 mg total) by mouth at bedtime. 30 tablet 2  . morphine (MS CONTIN) 15 MG 12 hr tablet Take 1 tablet (15 mg total) by mouth every 8 (eight) hours. 90 tablet 0  . ondansetron (ZOFRAN) 8 MG tablet Take 1 tablet (8 mg total) by mouth every 8 (eight) hours as needed for nausea or vomiting. 45 tablet 0  . oxybutynin (DITROPAN-XL) 5 MG 24 hr tablet TAKE 1 TABLET (5 MG TOTAL) BY MOUTH AT BEDTIME 30 tablet 0  . oxyCODONE (OXY IR/ROXICODONE) 5 MG immediate release tablet Take 1-2 tablets (5-10 mg total) by mouth every 4 (four) hours as needed for severe pain. 60 tablet 0  . promethazine (PHENERGAN) 25 MG tablet  Take 1 tablet (25 mg total) by mouth every 6 (six) hours as needed for nausea or vomiting. 30 tablet 0  . sodium phosphate (FLEET) 7-19 GM/118ML ENEM Place 133 mLs (1 enema total) rectally daily as needed for severe constipation. 2 enema 0  . tamsulosin (FLOMAX) 0.4 MG CAPS capsule TAKE 1 CAPSULE BY MOUTH EVERY DAY 30 capsule 1   No current facility-administered medications for this visit.   Facility-Administered Medications Ordered in Other Visits  Medication Dose Route Frequency Provider Last Rate Last Admin  . heparin lock flush 100 unit/mL  500 Units Intracatheter Once PRN Lloyd Huger, MD      . sodium chloride flush (NS) 0.9 % injection 10 mL  10 mL Intravenous PRN Lloyd Huger, MD   10 mL at 11/27/19 1028     VITAL SIGNS: There were no vitals taken for this visit. There were no vitals filed for this visit.  Estimated body mass index is 18.89 kg/m as calculated from the following:   Height as of 10/02/19: '5\' 2"'$  (1.575 m).   Weight as of an earlier encounter on 12/11/19: 103 lb 4.8 oz (46.9 kg).  LABS: CBC:    Component Value Date/Time   WBC 5.9 12/11/2019 0952   HGB 7.8 (L) 12/11/2019 0952   HGB 14.5 04/09/2019 0918   HCT 25.4 (L) 12/11/2019 0952   HCT 44.2 04/09/2019 0918   PLT 344 12/11/2019 0952   PLT 401 04/09/2019 0918   MCV 95.1 12/11/2019 0952   MCV 99 (H) 04/09/2019 0918   NEUTROABS 4.8 12/11/2019 0952   NEUTROABS 5.9 04/09/2019 0918   LYMPHSABS 0.3 (L) 12/11/2019 0952   LYMPHSABS 1.3 04/09/2019 0918   MONOABS 0.7 12/11/2019 0952   EOSABS 0.1 12/11/2019 0952   EOSABS 0.2 04/09/2019 0918   BASOSABS 0.0 12/11/2019 0952   BASOSABS 0.1 04/09/2019 0918   Comprehensive Metabolic Panel:    Component Value Date/Time   NA 134 (L) 12/11/2019 0952   NA 138 04/09/2019 0918   K 3.7 12/11/2019 0952   CL 97 (L) 12/11/2019 0952   CO2 27 12/11/2019 0952   BUN 10 12/11/2019 0952   BUN 12 04/09/2019 0918   CREATININE 0.88 12/11/2019 0952   GLUCOSE 134 (H) 12/11/2019 0952   CALCIUM 10.4 (H) 12/11/2019 0952   AST 16 12/11/2019 0952   ALT 7 12/11/2019 0952   ALKPHOS 132 (H) 12/11/2019 0952   BILITOT 0.6 12/11/2019 0952   BILITOT 0.4 04/09/2019 0918   PROT 6.4 (L) 12/11/2019 0952   PROT 6.8 04/09/2019 0918   ALBUMIN 2.9 (L) 12/11/2019 0952   ALBUMIN 4.0 04/09/2019 0918    RADIOGRAPHIC STUDIES: No results found.  PERFORMANCE STATUS (ECOG) : 1 - Symptomatic but completely ambulatory  Review of Systems Unless otherwise noted, a complete review of systems is negative.  Physical Exam General: NAD, frail appearing, thin Pulmonary: Unlabored Extremities: no edema Skin: no rashes Neurological: Weakness but otherwise nonfocal  IMPRESSION: Patient was seen in the infusion  area.  She endorses persistent vaginal bleeding over the past several days.  Hemoglobin is noted to have dropped from 10.2 on 1/27 to 7.8 today.  Discussed with Dr. Grayland Ormond.  Patient has been referred back to GYN oncology.  She continues to receive radiation but so far it has not improved her symptoms.  Discussed ER utilization in the event of worsening bleeding or signs of hemodynamic instability.  Patient does endorse improved overall pain on current pain regimen with MS Contin and  oxycodone.   PLAN: -Continue current scope of treatment -Continue MS Contin 15 mg every 8 hours -Continue oxycodone IR 5 to 10 mg every 3-4 hours as needed for breakthrough pain -Continue dexamethasone 4 mg daily -ACP documents and most form previously reviewed. Will need to readdress during future visit -Follow-up telephone visit in 2 weeks   Patient expressed understanding and was in agreement with this plan. She also understands that She can call the clinic at any time with any questions, concerns, or complaints.     Time Total: 20 minutes  Visit consisted of counseling and education dealing with the complex and emotionally intense issues of symptom management and palliative care in the setting of serious and potentially life-threatening illness.Greater than 50%  of this time was spent counseling and coordinating care related to the above assessment and plan.  Signed by: Altha Harm, PhD, NP-C [phone] (Work Cell)

## 2019-12-12 ENCOUNTER — Inpatient Hospital Stay (HOSPITAL_BASED_OUTPATIENT_CLINIC_OR_DEPARTMENT_OTHER): Payer: BC Managed Care – PPO | Admitting: Oncology

## 2019-12-12 ENCOUNTER — Telehealth: Payer: Self-pay

## 2019-12-12 ENCOUNTER — Ambulatory Visit
Admission: RE | Admit: 2019-12-12 | Discharge: 2019-12-12 | Disposition: A | Discharge status: Home / Self Care | Payer: BC Managed Care – PPO | Source: Ambulatory Visit | Attending: Radiation Oncology | Admitting: Radiation Oncology

## 2019-12-12 ENCOUNTER — Other Ambulatory Visit: Payer: Self-pay

## 2019-12-12 ENCOUNTER — Inpatient Hospital Stay (HOSPITAL_BASED_OUTPATIENT_CLINIC_OR_DEPARTMENT_OTHER): Payer: BC Managed Care – PPO | Admitting: Hospice and Palliative Medicine

## 2019-12-12 ENCOUNTER — Other Ambulatory Visit: Payer: Self-pay | Admitting: *Deleted

## 2019-12-12 ENCOUNTER — Inpatient Hospital Stay (HOSPITAL_BASED_OUTPATIENT_CLINIC_OR_DEPARTMENT_OTHER): Payer: BC Managed Care – PPO | Admitting: Obstetrics and Gynecology

## 2019-12-12 ENCOUNTER — Inpatient Hospital Stay: Payer: BC Managed Care – PPO

## 2019-12-12 VITALS — BP 121/75 | HR 78 | Temp 98.6°F | Resp 18

## 2019-12-12 VITALS — BP 109/75 | HR 94 | Resp 17

## 2019-12-12 VITALS — BP 116/75 | HR 80 | Temp 98.5°F | Resp 16 | Wt 103.0 lb

## 2019-12-12 DIAGNOSIS — Z5112 Encounter for antineoplastic immunotherapy: Secondary | ICD-10-CM | POA: Diagnosis not present

## 2019-12-12 DIAGNOSIS — C7989 Secondary malignant neoplasm of other specified sites: Secondary | ICD-10-CM

## 2019-12-12 DIAGNOSIS — C539 Malignant neoplasm of cervix uteri, unspecified: Secondary | ICD-10-CM

## 2019-12-12 DIAGNOSIS — Z66 Do not resuscitate: Secondary | ICD-10-CM | POA: Diagnosis not present

## 2019-12-12 DIAGNOSIS — Z95828 Presence of other vascular implants and grafts: Secondary | ICD-10-CM

## 2019-12-12 DIAGNOSIS — Z7189 Other specified counseling: Secondary | ICD-10-CM

## 2019-12-12 DIAGNOSIS — F419 Anxiety disorder, unspecified: Secondary | ICD-10-CM | POA: Diagnosis not present

## 2019-12-12 DIAGNOSIS — R110 Nausea: Secondary | ICD-10-CM | POA: Diagnosis not present

## 2019-12-12 DIAGNOSIS — R5383 Other fatigue: Secondary | ICD-10-CM | POA: Diagnosis not present

## 2019-12-12 DIAGNOSIS — C7800 Secondary malignant neoplasm of unspecified lung: Secondary | ICD-10-CM | POA: Diagnosis not present

## 2019-12-12 DIAGNOSIS — C778 Secondary and unspecified malignant neoplasm of lymph nodes of multiple regions: Secondary | ICD-10-CM | POA: Diagnosis not present

## 2019-12-12 DIAGNOSIS — C7951 Secondary malignant neoplasm of bone: Secondary | ICD-10-CM | POA: Diagnosis not present

## 2019-12-12 DIAGNOSIS — R300 Dysuria: Secondary | ICD-10-CM | POA: Diagnosis not present

## 2019-12-12 DIAGNOSIS — D500 Iron deficiency anemia secondary to blood loss (chronic): Secondary | ICD-10-CM

## 2019-12-12 DIAGNOSIS — G893 Neoplasm related pain (acute) (chronic): Secondary | ICD-10-CM

## 2019-12-12 DIAGNOSIS — N939 Abnormal uterine and vaginal bleeding, unspecified: Secondary | ICD-10-CM

## 2019-12-12 DIAGNOSIS — Z515 Encounter for palliative care: Secondary | ICD-10-CM | POA: Diagnosis not present

## 2019-12-12 DIAGNOSIS — Z510 Encounter for antineoplastic radiation therapy: Secondary | ICD-10-CM | POA: Diagnosis not present

## 2019-12-12 DIAGNOSIS — R531 Weakness: Secondary | ICD-10-CM | POA: Diagnosis not present

## 2019-12-12 DIAGNOSIS — C530 Malignant neoplasm of endocervix: Secondary | ICD-10-CM | POA: Diagnosis not present

## 2019-12-12 LAB — THYROID PANEL WITH TSH
Free Thyroxine Index: 2 (ref 1.2–4.9)
T3 Uptake Ratio: 28 % (ref 24–39)
T4, Total: 7.2 ug/dL (ref 4.5–12.0)
TSH: 1.45 u[IU]/mL (ref 0.450–4.500)

## 2019-12-12 LAB — COMPREHENSIVE METABOLIC PANEL
ALT: 7 U/L (ref 0–44)
AST: 17 U/L (ref 15–41)
Albumin: 2.9 g/dL — ABNORMAL LOW (ref 3.5–5.0)
Alkaline Phosphatase: 142 U/L — ABNORMAL HIGH (ref 38–126)
Anion gap: 12 (ref 5–15)
BUN: 10 mg/dL (ref 6–20)
CO2: 25 mmol/L (ref 22–32)
Calcium: 10.6 mg/dL — ABNORMAL HIGH (ref 8.9–10.3)
Chloride: 95 mmol/L — ABNORMAL LOW (ref 98–111)
Creatinine, Ser: 0.8 mg/dL (ref 0.44–1.00)
GFR calc Af Amer: >60 mL/min (ref >60)
GFR calc non Af Amer: >60 mL/min (ref >60)
Glucose, Bld: 116 mg/dL — ABNORMAL HIGH (ref 70–99)
Potassium: 3.6 mmol/L (ref 3.5–5.1)
Sodium: 132 mmol/L — ABNORMAL LOW (ref 135–145)
Total Bilirubin: 0.7 mg/dL (ref 0.3–1.2)
Total Protein: 6.6 g/dL (ref 6.5–8.1)

## 2019-12-12 LAB — CBC WITH DIFFERENTIAL/PLATELET
Abs Immature Granulocytes: 0.04 10*3/uL (ref 0.00–0.07)
Basophils Absolute: 0 10*3/uL (ref 0.0–0.1)
Basophils Relative: 0 %
Eosinophils Absolute: 0.1 10*3/uL (ref 0.0–0.5)
Eosinophils Relative: 2 %
HCT: 25.5 % — ABNORMAL LOW (ref 36.0–46.0)
Hemoglobin: 7.8 g/dL — ABNORMAL LOW (ref 12.0–15.0)
Immature Granulocytes: 1 %
Lymphocytes Relative: 7 %
Lymphs Abs: 0.4 10*3/uL — ABNORMAL LOW (ref 0.7–4.0)
MCH: 29.2 pg (ref 26.0–34.0)
MCHC: 30.6 g/dL (ref 30.0–36.0)
MCV: 95.5 fL (ref 80.0–100.0)
Monocytes Absolute: 0.7 10*3/uL (ref 0.1–1.0)
Monocytes Relative: 11 %
Neutro Abs: 5.3 10*3/uL (ref 1.7–7.7)
Neutrophils Relative %: 79 %
Platelets: 364 10*3/uL (ref 150–400)
RBC: 2.67 MIL/uL — ABNORMAL LOW (ref 3.87–5.11)
RDW: 17.6 % — ABNORMAL HIGH (ref 11.5–15.5)
WBC: 6.7 10*3/uL (ref 4.0–10.5)
nRBC: 0 % (ref 0.0–0.2)

## 2019-12-12 LAB — HEMOGLOBIN: Hemoglobin: 9.9 g/dL — ABNORMAL LOW (ref 12.0–15.0)

## 2019-12-12 MED ORDER — SODIUM CHLORIDE 0.9% FLUSH
10.0000 mL | Freq: Once | INTRAVENOUS | Status: AC
  Administered 2019-12-12: 10 mL via INTRAVENOUS
  Filled 2019-12-12: qty 10

## 2019-12-12 MED ORDER — DIPHENHYDRAMINE HCL 50 MG/ML IJ SOLN
25.0000 mg | Freq: Once | INTRAMUSCULAR | Status: AC
  Administered 2019-12-12: 25 mg via INTRAVENOUS

## 2019-12-12 MED ORDER — ACETAMINOPHEN 325 MG PO TABS
650.0000 mg | ORAL_TABLET | Freq: Once | ORAL | Status: AC
  Administered 2019-12-12: 12:00:00 650 mg via ORAL
  Filled 2019-12-12: qty 2

## 2019-12-12 MED ORDER — HEPARIN SOD (PORK) LOCK FLUSH 100 UNIT/ML IV SOLN
500.0000 [IU] | Freq: Once | INTRAVENOUS | Status: AC | PRN
  Administered 2019-12-12: 500 [IU]
  Filled 2019-12-12: qty 5

## 2019-12-12 MED ORDER — DIPHENHYDRAMINE HCL 50 MG/ML IJ SOLN
25.0000 mg | Freq: Once | INTRAMUSCULAR | Status: AC
  Administered 2019-12-12: 25 mg via INTRAVENOUS
  Filled 2019-12-12: qty 1

## 2019-12-12 MED ORDER — ALPRAZOLAM 0.25 MG PO TABS: 0.2500 mg | ORAL_TABLET | Freq: Two times a day (BID) | ORAL | 0 refills | Status: AC | PRN

## 2019-12-12 MED ORDER — DIPHENHYDRAMINE HCL 50 MG/ML IJ SOLN
INTRAMUSCULAR | Status: AC
  Filled 2019-12-12: qty 1

## 2019-12-12 MED ORDER — SODIUM CHLORIDE 0.9% IV SOLUTION
250.0000 mL | Freq: Once | INTRAVENOUS | Status: AC
  Administered 2019-12-12: 250 mL via INTRAVENOUS
  Filled 2019-12-12: qty 250

## 2019-12-12 NOTE — Telephone Encounter (Addendum)
Per Dr. Lucky Cowboy either he or Dr. Delana Meyer can see her either tomorrow or Friday depending on her schedule. It appears Dr. Delana Meyer has performed her thrombectomy and port removal. Call [facility] to arrange appointment. New referral sent for evaluation for uterine artery embolization. Appointment was made with Dr. Delana Meyer tomorrow, 2/4 at 1430. Mariah Mcmahon is still in clinic and notified of this appointment.

## 2019-12-12 NOTE — Progress Notes (Signed)
Mariah Mcmahon  Telephone:([phone] Fax:(336) [phone]  ID: YASAMIN KAREL OB: 06-16-1966  MR#: [id number]  QTM#:[id number]  Patient Care Team: Venita Lick, NP as PCP - General (Nurse Practitioner) Clent Jacks, RN as Oncology Nurse Navigator   CHIEF COMPLAINT: Progressive stage IV cervical cancer  INTERVAL HISTORY: Patient returns to clinic today for further evaluation and blood transfusion.  She has continued vaginal bleeding, but significantly decreased over the past 24 hours.  She continues to have increased weakness and fatigue.  She continues to have nausea and increased pain. She has no neurologic complaints.  She denies any recent fevers or illnesses.  She has no chest pain, shortness of breath, cough, or hemoptysis.  She denies any nausea, vomiting, constipation, or diarrhea.  She has no urinary complaints.  Patient offers no further specific complaints today.  REVIEW OF SYSTEMS:   Review of Systems  Constitutional: Positive for malaise/fatigue. Negative for fever and weight loss.  Respiratory: Negative.  Negative for cough and shortness of breath.   Cardiovascular: Negative.  Negative for chest pain and leg swelling.  Gastrointestinal: Positive for nausea. Negative for abdominal pain and constipation.  Genitourinary: Negative.  Negative for flank pain and hematuria.       Vaginal bleeding.  Musculoskeletal: Negative for back pain.  Skin: Negative.  Negative for rash.  Neurological: Positive for weakness. Negative for dizziness, focal weakness and headaches.  Psychiatric/Behavioral: The patient is nervous/anxious. The patient does not have insomnia.     As per HPI. Otherwise, a complete review of systems is negative.  PAST MEDICAL HISTORY: Past Medical History:  Diagnosis Date  . Anemia   . Cancer ([place])    cwervical cancer  . Cervical cancer ([place])   . No pertinent past medical history     PAST SURGICAL HISTORY: Past Surgical History:   Procedure Laterality Date  . CYSTOSCOPY W/ URETERAL STENT PLACEMENT Right 10/02/2019   Procedure: CYSTOSCOPY WITH RETROGRADE PYELOGRAM/URETERAL STENT PLACEMENT;  Surgeon: Hollice Espy, MD;  Location: [facility] ORS;  Service: Urology;  Laterality: Right;  . IVC FILTER REMOVAL N/A 08/07/2019   Procedure: IVC FILTER REMOVAL;  Surgeon: Katha Cabal, MD;  Location: [place] CV LAB;  Service: Cardiovascular;  Laterality: N/A;  . MASS EXCISION     Throat  . PERIPHERAL VASCULAR THROMBECTOMY Left 06/05/2019   Procedure: PERIPHERAL VASCULAR THROMBECTOMY WITH IVC FILTER (LEFT LOWER EXTREMITY);  Surgeon: Katha Cabal, MD;  Location: [place] CV LAB;  Service: Cardiovascular;  Laterality: Left;  . PORTA CATH INSERTION N/A 06/06/2019   Procedure: PORTA CATH INSERTION;  Surgeon: Katha Cabal, MD;  Location: [place] CV LAB;  Service: Cardiovascular;  Laterality: N/A;  . TONSILLECTOMY      FAMILY HISTORY: Family History  Problem Relation Age of Onset  . Brain cancer Mother 43  . Alcohol abuse Father   . Heart disease Father   . Prostate cancer Father   . Heart attack Father   . Diabetes Father     ADVANCED DIRECTIVES (Y/N):  N  HEALTH MAINTENANCE: Social History   Tobacco Use  . Smoking status: Former Smoker    Packs/day: 0.50    Years: 30.00    Pack years: 15.00    Types: Cigarettes    Quit date: 07/03/2019    Years since quitting: 0.4  . Smokeless tobacco: Never Used  Substance Use Topics  . Alcohol use: Not Currently  . Drug use: Never     Colonoscopy:  PAP:  Bone density:  Lipid panel:  No Known Allergies  Current Outpatient Medications  Medication Sig Dispense Refill  . ALPRAZolam (XANAX) 0.25 MG tablet Take 1 tablet (0.25 mg total) by mouth 2 (two) times daily as needed for anxiety. 45 tablet 0  . apixaban (ELIQUIS) 5 MG TABS tablet Take 1 tablet (5 mg total) by mouth 2 (two) times daily. 180 tablet 1  . dexamethasone (DECADRON) 4 MG tablet  Take 1 tablet (4 mg total) by mouth daily. 14 tablet 0  . lactulose (CHRONULAC) 10 GM/15ML solution Take 15-30 mLs (10-20 g total) by mouth daily as needed (constipation). (Patient taking differently: Take 10-20 g by mouth 2 (two) times daily. ) 236 mL 0  . mirtazapine (REMERON) 7.5 MG tablet Take 1 tablet (7.5 mg total) by mouth at bedtime. 30 tablet 2  . morphine (MS CONTIN) 15 MG 12 hr tablet Take 1 tablet (15 mg total) by mouth every 8 (eight) hours. 90 tablet 0  . ondansetron (ZOFRAN) 8 MG tablet Take 1 tablet (8 mg total) by mouth every 8 (eight) hours as needed for nausea or vomiting. 45 tablet 0  . oxybutynin (DITROPAN-XL) 5 MG 24 hr tablet TAKE 1 TABLET (5 MG TOTAL) BY MOUTH AT BEDTIME 30 tablet 0  . oxyCODONE (OXY IR/ROXICODONE) 5 MG immediate release tablet Take 1-2 tablets (5-10 mg total) by mouth every 4 (four) hours as needed for severe pain. 60 tablet 0  . promethazine (PHENERGAN) 25 MG tablet Take 1 tablet (25 mg total) by mouth every 6 (six) hours as needed for nausea or vomiting. 30 tablet 0  . sodium phosphate (FLEET) 7-19 GM/118ML ENEM Place 133 mLs (1 enema total) rectally daily as needed for severe constipation. 2 enema 0  . tamsulosin (FLOMAX) 0.4 MG CAPS capsule TAKE 1 CAPSULE BY MOUTH EVERY DAY 30 capsule 1   No current facility-administered medications for this visit.   Facility-Administered Medications Ordered in Other Visits  Medication Dose Route Frequency Provider Last Rate Last Admin  . heparin lock flush 100 unit/mL  500 Units Intracatheter Once PRN Lloyd Huger, MD      . sodium chloride flush (NS) 0.9 % injection 10 mL  10 mL Intravenous PRN Lloyd Huger, MD   10 mL at 11/27/19 1028    OBJECTIVE: Vitals:   12/12/19 1131  BP: 109/75  Pulse: 94  Resp: 17  SpO2: 100%     There is no height or weight on file to calculate BMI.    ECOG FS:0 - Asymptomatic  General: Thin, no acute distress. Eyes: Pink conjunctiva, anicteric sclera. HEENT:  Normocephalic, moist mucous membranes. Lungs: No audible wheezing or coughing. Heart: Regular rate and rhythm. Abdomen: Soft, nontender, no obvious distention. Musculoskeletal: No edema, cyanosis, or clubbing. Neuro: Alert, answering all questions appropriately. Cranial nerves grossly intact. Skin: No rashes or petechiae noted. Psych: Normal affect.  LAB RESULTS:  Lab Results  Component Value Date   NA 132 (L) 12/12/2019   K 3.6 12/12/2019   CL 95 (L) 12/12/2019   CO2 25 12/12/2019   GLUCOSE 116 (H) 12/12/2019   BUN 10 12/12/2019   CREATININE 0.80 12/12/2019   CALCIUM 10.6 (H) 12/12/2019   PROT 6.6 12/12/2019   ALBUMIN 2.9 (L) 12/12/2019   AST 17 12/12/2019   ALT 7 12/12/2019   ALKPHOS 142 (H) 12/12/2019   BILITOT 0.7 12/12/2019   GFRNONAA >60 12/12/2019   GFRAA >60 12/12/2019    Lab Results  Component Value Date  WBC 6.7 12/12/2019   NEUTROABS 5.3 12/12/2019   HGB 7.8 (L) 12/12/2019   HCT 25.5 (L) 12/12/2019   MCV 95.5 12/12/2019   PLT 364 12/12/2019     STUDIES: No results found.  ASSESSMENT: Progressive stage IV cervical cancer.  PLAN:    1.  Progressive stage IV cervical cancer: Biopsy results from April 24, 2019 confirmed the diagnosis. PET scan on September 17, 2019 revealed progressive disease.  Initial plan was to enroll in clinical trial at [facility], but secondary to significant urine protein patient did not qualify.  PD-L1 was found to be positive (3%, CPS is greater than 1).  Patient received cycle 3 of Keytruda yesterday.  Gynecologic exam from today suggest possible progression of disease.  Patient return to clinic in 1 week for laboratory work and further evaluation and 3 weeks for further evaluation and consideration of cycle 4 of Keytruda.  Plan to do restaging CT scan 1 to 2 weeks prior to next treatment. 2.  Hydronephrosis: Imaging as above.  Ureteral stent appears to be appropriately in place. 3.  Venous access: Patient now has had a port  placed. 4.  Renal insufficiency: Resolved.   5.  Anemia: Patient receiving 2 units of packed red blood cells today.  Continue daily XRT.  Dr. Theora Gianotti also discussed case with vascular surgery for consideration of embolization.  We will discontinue Eliquis at this time. 6.  Abdominal/flank pain: Continue current narcotics as prescribed.  Appreciate palliative care input. 7.  DVT/PE: Diagnosed in July 2020.  Patient required stent placement and thrombolytics.  IVC filter has been removed.  Given extensive bleeding, will discontinue Eliquis at this time. 8.  Thrombocytopenia: Resolved. 9.  Anxiety: Continue Xanax as prescribed. 10.  Poor appetite/insomnia: Continue Remeron. 11.  Vaginal bleeding: XRT and possible vascular surgery evaluation as above. 12.  Constipation: Resolved.  Continue current bowel regimen.  Patient expressed understanding and was in agreement with this plan. She also understands that She can call clinic at any time with any questions, concerns, or complaints.   Cancer Staging Cervical cancer [facility]) Staging form: Cervix Uteri, AJCC 8th Edition - Clinical stage from 11/17/2019: FIGO Stage IVB (cT3b, cN1, pM1) - Signed by Lloyd Huger, MD on 11/17/2019   Lloyd Huger, MD   12/12/2019 4:03 PM

## 2019-12-12 NOTE — Progress Notes (Signed)
[place]  [phone] [phone]   Name: Mariah Mcmahon Date: 12/12/2019 MRN: [id number]  DOB: [birth year]  Patient Care Team: Venita Lick, NP as PCP - General (Nurse Practitioner) Clent Jacks, RN as Oncology Nurse Navigator    REASON FOR CONSULTATION: [name] is a 54 y.o. 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  Patient is felt not to be a surgical candidate and treatment was initiated with concurrent chemoradiation.  Patient was hospitalized 06/05/2019 -06/07/2019 with acute left lower extremity DVT/pulmonary embolism status post thrombolysis and IVC filter placement.  IVC filter was later removed.  Patient had disease progression on carbotaxol and was referred to [facility] for a clinical trial.  PET on 09/17/2019, which revealed interval progression of hypermetabolic nodal disease in chest, abdomen, and pelvis with new hypermetabolic disease in the region of the right vaginal fornix/cervix. Decision was made to proceed with systemic chemotherapy. CT scan on 12/21 revealed mixed response to treatment. Patient was rotated to [facility].  She has had vaginal bleeding and was started on RT.  Patient was referred to palliative care to help address goals and manage ongoing symptoms.  SOCIAL HISTORY:     reports that she quit smoking about 5 months ago. Her smoking use included cigarettes. She has a 15.00 pack-year smoking history. She has never used smokeless tobacco. She reports previous alcohol use. She reports that she does not use drugs.  Patient is not married.  She lives at home alone.  She has a son who is involved in her care, who lives nearby.  Patient previously worked at [employer] in [place] and recently stopped working due to [employer].  ADVANCE DIRECTIVES:  Does not have  CODE STATUS: DNR/DNI (DNR form completed  on 12/12/19)  PAST MEDICAL HISTORY: Past Medical History:  Diagnosis Date  . Anemia   . Cancer ([place])    cwervical cancer  . Cervical cancer ([place])   . No pertinent past medical history     PAST SURGICAL HISTORY:  Past Surgical History:  Procedure Laterality Date  . CYSTOSCOPY W/ URETERAL STENT PLACEMENT Right 10/02/2019   Procedure: CYSTOSCOPY WITH RETROGRADE PYELOGRAM/URETERAL STENT PLACEMENT;  Surgeon: Hollice Espy, MD;  Location: [facility] ORS;  Service: Urology;  Laterality: Right;  . IVC FILTER REMOVAL N/A 08/07/2019   Procedure: IVC FILTER REMOVAL;  Surgeon: Katha Cabal, MD;  Location: [place] [facility];  Service: Cardiovascular;  Laterality: N/A;  . MASS EXCISION     Throat  . PERIPHERAL VASCULAR THROMBECTOMY Left 06/05/2019   Procedure: PERIPHERAL VASCULAR THROMBECTOMY WITH IVC FILTER (LEFT LOWER EXTREMITY);  Surgeon: Katha Cabal, MD;  Location: [place] [facility];  Service: Cardiovascular;  Laterality: Left;  . PORTA CATH INSERTION N/A 06/06/2019   Procedure: PORTA CATH INSERTION;  Surgeon: Katha Cabal, MD;  Location: [place] [facility];  Service: Cardiovascular;  Laterality: N/A;  . TONSILLECTOMY      HEMATOLOGY/ONCOLOGY HISTORY:  Oncology History  Cervical cancer ([place])  05/04/2019 Initial Diagnosis   Cervical cancer ([place])   05/17/2019 - 07/04/2019 Chemotherapy   The patient had palonosetron (ALOXI) injection 0.25 mg, 0.25 mg, Intravenous,  Once, 6 of 7 cycles Administration: 0.25 mg (05/17/2019), 0.25 mg (05/24/2019), 0.25 mg (05/31/2019), 0.25 mg (06/14/2019), 0.25 mg (06/22/2019), 0.25 mg (06/28/2019) CISplatin (PLATINOL) 61 mg in sodium chloride 0.9 % 250 mL chemo infusion, 40 mg/m2 = 61  mg, Intravenous,  Once, 6 of 7 cycles Administration: 61 mg (05/17/2019), 61 mg (05/24/2019), 61 mg (05/31/2019), 61 mg (06/14/2019), 61 mg (06/22/2019), 61 mg (06/28/2019) fosaprepitant (EMEND) 150 mg, dexamethasone (DECADRON) 12 mg in sodium chloride 0.9 % 145 mL IVPB, ,  Intravenous,  Once, 6 of 7 cycles Administration:  (05/17/2019),  (05/24/2019),  (05/31/2019),  (06/14/2019),  (06/22/2019),  (06/28/2019)  for chemotherapy treatment.    07/20/2019 - 09/20/2019 Chemotherapy   The patient had palonosetron (ALOXI) injection 0.25 mg, 0.25 mg, Intravenous,  Once, 3 of 6 cycles Administration: 0.25 mg (07/20/2019), 0.25 mg (08/10/2019), 0.25 mg (08/31/2019) pegfilgrastim (NEULASTA ONPRO KIT) injection 6 mg, 6 mg, Subcutaneous, Once, 2 of 5 cycles Administration: 6 mg (07/20/2019), 6 mg (08/31/2019) CARBOplatin (PARAPLATIN) 520 mg in sodium chloride 0.9 % 250 mL chemo infusion, 520 mg (95.1 % of original dose 541.8 mg), Intravenous,  Once, 3 of 6 cycles Dose modification:   (original dose 541.8 mg, Cycle 1) Administration: 520 mg (07/20/2019), 520 mg (08/10/2019), 520 mg (08/31/2019) PACLitaxel (TAXOL) 300 mg in sodium chloride 0.9 % 250 mL chemo infusion (> 47m/m2), 200 mg/m2 = 300 mg, Intravenous,  Once, 3 of 6 cycles Administration: 300 mg (07/20/2019), 300 mg (08/10/2019), 300 mg (08/31/2019) fosaprepitant (EMEND) 150 mg, dexamethasone (DECADRON) 12 mg in sodium chloride 0.9 % 145 mL IVPB, , Intravenous,  Once, 3 of 6 cycles Administration:  (07/20/2019),  (08/10/2019),  (08/31/2019)  for chemotherapy treatment.    10/30/2019 -  Chemotherapy   The patient had pembrolizumab (KEYTRUDA) 200 mg in sodium chloride 0.9 % 50 mL chemo infusion, 200 mg, Intravenous, Once, 3 of 6 cycles Administration: 200 mg (10/30/2019), 200 mg (12/11/2019), 200 mg (11/20/2019)  for chemotherapy treatment.    11/17/2019 Cancer Staging   Staging form: Cervix Uteri, AJCC 8th Edition - Clinical stage from 11/17/2019: FIGO Stage IVB (cT3b, cN1, pM1) - Signed by FLloyd Huger MD on 11/17/2019     ALLERGIES:  has No Known Allergies.  MEDICATIONS:  Current Outpatient Medications  Medication Sig Dispense Refill  . ALPRAZolam (XANAX) 0.25 MG tablet Take 1 tablet (0.25 mg total) by mouth 2 (two) times  daily as needed for anxiety. 45 tablet 0  . apixaban (ELIQUIS) 5 MG TABS tablet Take 1 tablet (5 mg total) by mouth 2 (two) times daily. 180 tablet 1  . dexamethasone (DECADRON) 4 MG tablet Take 1 tablet (4 mg total) by mouth daily. 14 tablet 0  . lactulose (CHRONULAC) 10 GM/15ML solution Take 15-30 mLs (10-20 g total) by mouth daily as needed (constipation). (Patient taking differently: Take 10-20 g by mouth 2 (two) times daily. ) 236 mL 0  . mirtazapine (REMERON) 7.5 MG tablet Take 1 tablet (7.5 mg total) by mouth at bedtime. 30 tablet 2  . morphine (MS CONTIN) 15 MG 12 hr tablet Take 1 tablet (15 mg total) by mouth every 8 (eight) hours. 90 tablet 0  . ondansetron (ZOFRAN) 8 MG tablet Take 1 tablet (8 mg total) by mouth every 8 (eight) hours as needed for nausea or vomiting. 45 tablet 0  . oxybutynin (DITROPAN-XL) 5 MG 24 hr tablet TAKE 1 TABLET (5 MG TOTAL) BY MOUTH AT BEDTIME 30 tablet 0  . oxyCODONE (OXY IR/ROXICODONE) 5 MG immediate release tablet Take 1-2 tablets (5-10 mg total) by mouth every 4 (four) hours as needed for severe pain. 60 tablet 0  . promethazine (PHENERGAN) 25 MG tablet Take 1 tablet (25 mg total) by mouth every 6 (six) hours as  needed for nausea or vomiting. 30 tablet 0  . sodium phosphate (FLEET) 7-19 GM/118ML ENEM Place 133 mLs (1 enema total) rectally daily as needed for severe constipation. 2 enema 0  . tamsulosin (FLOMAX) 0.4 MG CAPS capsule TAKE 1 CAPSULE BY MOUTH EVERY DAY 30 capsule 1   No current facility-administered medications for this visit.   Facility-Administered Medications Ordered in Other Visits  Medication Dose Route Frequency Provider Last Rate Last Admin  . heparin lock flush 100 unit/mL  500 Units Intracatheter Once PRN Lloyd Huger, MD      . sodium chloride flush (NS) 0.9 % injection 10 mL  10 mL Intravenous PRN Lloyd Huger, MD   10 mL at 11/27/19 1028    VITAL SIGNS: There were no vitals taken for this visit. There were no  vitals filed for this visit.  Estimated body mass index is 18.84 kg/m as calculated from the following:   Height as of 10/02/19: _0  (1.575 m).   Weight as of an earlier encounter on 12/12/19: 103 lb (46.7 kg).  LABS: CBC:    Component Value Date/Time   WBC 6.7 12/12/2019 1037   HGB 7.8 (L) 12/12/2019 1037   HGB 14.5 04/09/2019 0918   HCT 25.5 (L) 12/12/2019 1037   HCT 44.2 04/09/2019 0918   PLT 364 12/12/2019 1037   PLT 401 04/09/2019 0918   MCV 95.5 12/12/2019 1037   MCV 99 (H) 04/09/2019 0918   NEUTROABS 5.3 12/12/2019 1037   NEUTROABS 5.9 04/09/2019 0918   LYMPHSABS 0.4 (L) 12/12/2019 1037   LYMPHSABS 1.3 04/09/2019 0918   MONOABS 0.7 12/12/2019 1037   EOSABS 0.1 12/12/2019 1037   EOSABS 0.2 04/09/2019 0918   BASOSABS 0.0 12/12/2019 1037   BASOSABS 0.1 04/09/2019 0918   Comprehensive Metabolic Panel:    Component Value Date/Time   NA 132 (L) 12/12/2019 1037   NA 138 04/09/2019 0918   K 3.6 12/12/2019 1037   CL 95 (L) 12/12/2019 1037   CO2 25 12/12/2019 1037   BUN 10 12/12/2019 1037   BUN 12 04/09/2019 0918   CREATININE 0.80 12/12/2019 1037   GLUCOSE 116 (H) 12/12/2019 1037   CALCIUM 10.6 (H) 12/12/2019 1037   AST 17 12/12/2019 1037   ALT 7 12/12/2019 1037   ALKPHOS 142 (H) 12/12/2019 1037   BILITOT 0.7 12/12/2019 1037   BILITOT 0.4 04/09/2019 0918   PROT 6.6 12/12/2019 1037   PROT 6.8 04/09/2019 0918   ALBUMIN 2.9 (L) 12/12/2019 1037   ALBUMIN 4.0 04/09/2019 0918    RADIOGRAPHIC STUDIES: No results found.  PERFORMANCE STATUS (ECOG) : 1 - Symptomatic but completely ambulatory  Review of Systems Unless otherwise noted, a complete review of systems is negative.  Physical Exam General: NAD, frail appearing, thin Pulmonary: Unlabored Extremities: no edema Skin: no rashes Neurological: Weakness but otherwise nonfocal  IMPRESSION: Patient was an add-on today following her visit with GYN oncology.  Per report, cervical exam showed severe erosion with  possible exposure of the small bowel.  Bleeding had stopped but patient is being referred to vascular for consideration of embolization.  Anticoagulation is also being discontinued.  Patient was quite tearful with me following her visit with other providers.  She says that she was given "bad news."  She verbalized feeling strongly that she does not want to give up hope and wants to continue forward with treatment if any options are available.  She says she hopes "for a miracle."  She worries about  her son and grandson.  She says she has been trying to protect her son and is afraid of telling him how sick she is.  We did discuss ways that she could facilitate having that conversation.  I also invited him to the clinic if needed.  We discussed advance care planning.  Patient has documents at home but has been hesitant to have that conversation.  Today, she says clearly that she would not want to be resuscitated or have her life prolonged artificially machines.  She says that she would like to "go with grace and dignity" when it is her time.  I completed a DNR order for her to take home.  Chaplain was called.  Emotional support was provided.  I will refill her alprazolam.  PLAN: -Continue current scope of treatment -Continue MS Contin 15 mg every 8 hours -Continue oxycodone IR 5 to 10 mg every 3-4 hours as needed for breakthrough pain -Continue dexamethasone 4 mg daily -Refill alprazolam 0.25 mg twice daily as needed (#45) -DNR/DNI -Follow-up telephone visit in 2 weeks   Patient expressed understanding and was in agreement with this plan. She also understands that She can call the clinic at any time with any questions, concerns, or complaints.     Time Total: 30 minutes  Visit consisted of counseling and education dealing with the complex and emotionally intense issues of symptom management and palliative care in the setting of serious and potentially life-threatening illness.Greater than 50%   of this time was spent counseling and coordinating care related to the above assessment and plan.  Signed by: Altha Harm, PhD, NP-C [phone] (Work Cell)

## 2019-12-12 NOTE — Progress Notes (Signed)
Gynecologic Oncology Interval Visit   Referring Provider: Dr. Georgianne Fick  Chief Complaint: Malignant Neoplasm of Endocervix  Subjective:  Mariah Mcmahon is a 54 y.o. G12P0011 female, diagnosed with stage IIIB cervical cancer, initially seen in consultation from Dr. Georgianne Fick, who returns to clinic for acute visit increased vaginal bleeding.   Today, she reports increased vaginal bleeding with significant hemorrhage requiring blood transfusions with 2 U PRBCs on today (plus 1 U PRBC on 11/30/19) and requiring initiation of radiation with palliative intent. Plan another 1980 cGy in 11 fractions encompassing all areas of seen disease in her pelvic abdominal region.  She started radiation on 12/04/19 with Dr. Baruch Gouty.   Pembrolizumab cycle #2 11/20/19 and cycle #3 12/11/19.   Last night she has more bleeding a pad an hour. Her bleeding as slowed down now. She is very worried about another exam as that causes increased bleeding. She is on Eliquis.    Gynecologic Oncology History:  Initially presented to PCP for abnormal uterine bleeding, pelvic and back pain.  Lumbar spine x-ray was normal.  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  She reported heavy and painful irregular bleeding.  Pain is present outside of menses.  She is a smoker with 15-pack-year history.  Denies history of abnormal Paps but prior results not available for review.  She presented to Dr. Georgianne Fick on 04/24/2019.  On exam, barrel-shaped with mass-effect of left portion of cervix, cervix grossly enlarged, friable with contact bleeding colposcopy was performed.  Urine pregnancy test was negative.    Diagnosis: 1.  Cervix, biopsy, 12:00 -High-grade squamous intraepithelial lesion, CIN-3 2.  Cervix, biopsy, 3:00 -High-grade squamous intraepithelial lesion, CIN-3 3.  Cervix, biopsy, 5:00 -High-grade squamous intraepithelial lesion, CIN-3 with foci suspicious for early stromal microinvasion 4.  Endocervix,  curettage -High-grade squamous intraepithelial lesion, CIN-3  HIV screening-non-reactive (04/09/2019)  05/02/2019- CT C/A/P 1. Heterogeneous enlargement of the uterus and cervix with apparent soft tissue thickening in the upper vagina. Cervical and vaginal tissues not well evaluated by CT. 2. Bulky retroperitoneal lymphadenopathy in the abdomen with bilateral common iliac and pelvic sidewall lymphadenopathy in the pelvis. Imaging features consistent with metastatic disease. 3. 5 cm soft tissue lesion to the left of the bladder is consistent with a metastatic deposit. 4. Mild to moderate right hydroureteronephrosis with decreased perfusion to the right kidney. Right ureteral obstruction is at the level of the right pelvic sidewall. 5. 2.2 cm mixed lytic and lucent lesion in the left sacrum, indeterminate, but metastatic disease not excluded.  6. Several 3-4 mm nodules identified in the lungs. Close attention on follow-up recommended as metastatic disease not excluded.  7. Small volume ascites.  05/07/2019- PET- Initial for staging IMPRESSION: 1. Hypermetabolic mass in the cervix compatible with known primary cervical malignancy. Hypermetabolism throughout the enlarged lower uterine segment and body of the uterus compatible with malignant involvement of the uterus. 2. Hypermetabolic right internal and external iliac, bilateral common iliac, aortocaval, left para-aortic and bilateral retrocrural lymphadenopathy compatible with metastatic nodal disease. 3. Hypermetabolic soft tissue implant in the anterior left pelvis surrounded by small volume pelvic ascites, favor pelvic peritoneal metastasis. 4. No hypermetabolic osseous metastatic disease. No hypermetabolism associated with the mixed lytic and sclerotic upper left sacral lesion, which is probably benign. 5. Tiny bilateral scattered solid pulmonary nodules, below PET resolution, indeterminate, recommend attention on close chest CT follow-up in 3  months. 6.  Aortic Atherosclerosis (ICD10-I70.0).  05/17/2019-06/28/2019- She completed 6 cycles of cisplatin on 06/28/2019 &  radiation. PET showed mixed response.   PET- 07/10/2019- mixed response; new hypermetabolic left supraclavicular, paratracheal, and subcarinal lymph nodes compatible with nodal progression in chest and neck. Decreased hypermetabolism within retroperitoneal nodal metastases. Complete resolution of hypermetabolic activity within cervix, uterus, and low left pelvic peritoneal mets.   She received 3 cycles of carbo (AUC 6) and paclitaxel (200 mg/m2) 07/20/2019-08/31/2019.   She suffered several small pulmonary emboli in July 2020 with extensive acute occlusive DVT in the left common femoral vein, popliteal veins.  She continues anticoagulation.  09/17/2019 PET showed progression.  IMPRESSION: 1. Interval progression of hypermetabolic nodal disease in the chest, abdomen, and pelvis. Lymph nodes are not well demonstrated on noncontrast CT images. 2. New hypermetabolic disease in the region of the right vaginal fornix/cervix compatible with disease progression.   There is mild to moderate right hydroureteronephrosis, progressive since 07/10/2019. Level of ureteral obstruction appears to be pelvic sidewall in the pelvis.  10/02/2019 Right ureteral stent placed  She was screened at [facility] for CLOVIS LIO-1 trial. LIO-1: A Phase 1b/2, Open-Label Study to Evaluate the Safety and Efficacy of Lucitanib in Combination With Nivolumab in Patients With An Advanced, Metastatic Solid Tumor Continuing the Study Drug Regimen Beyond Disease Progression IRB# [id number].  Cervical biopsies were obtained. Due to persistent proteinuria she was not a candidate for trail.   Pathology- 10/09/2019 at [place]. Cervix, biopsy: Invasive squamous cell carcinoma, moderately differentiated. B. Cervix, biopsy: High grade squamous intraepithelial lesion (HSIL)/cervical intraepithelial neoplasia-3 (CIN-3) at least,  with foci highly suspicious for invasive carcinoma. C. Cervix, biopsy: Invasive squamous cell carcinoma, moderately differentiated.  10/09/2019- PD-L1 was positive- TPS 3% (results scanned under media tab in epic)  10/29/2019 for acute abdominal pain showed mixed response to therapy with further enlargement lesions of the cervix and uterus, increased lymphadenopathy along right pelvic sidewall adjacent to the distal third of the right ureter, enlarging lymph nodes, and/or metastatic deposits in the cul-de-sac, and new capsular implants associated with the right lymphoproliferative with slight increase in trace volume of ascites, bulky lymphadenopathy and atrophic symmetric peritoneum has regressed considerably prior to disease.  Despite presence of distant biliary stents mild persistent right hydroureteronephrosis.  She initiated pembrolizumab on 10/30/2019     Problem List: Patient Active Problem List   Diagnosis Date Noted  . Goals of care, counseling/discussion 11/01/2019  . Current smoker 09/25/2019  . High risk medication use 09/24/2019  . Other specified personal risk factors, not elsewhere classified 09/24/2019  . DVT of lower limb, acute ([place]) 06/05/2019  . Iron deficiency anemia secondary to blood loss (chronic) 05/24/2019  . Examination of participant in clinical trial 05/12/2019  . Cervical cancer ([place]) 05/04/2019  . Generalized abdominal pain 04/09/2019  . Acute midline low back pain without sciatica 04/09/2019  . Nicotine dependence, cigarettes, uncomplicated [id number]    Past Medical History: Past Medical History:  Diagnosis Date  . Anemia   . Cancer ([place])    cwervical cancer  . Cervical cancer ([place])   . No pertinent past medical history     Past Surgical History: Past Surgical History:  Procedure Laterality Date  . CYSTOSCOPY W/ URETERAL STENT PLACEMENT Right 10/02/2019   Procedure: CYSTOSCOPY WITH RETROGRADE PYELOGRAM/URETERAL STENT PLACEMENT;  Surgeon:  Hollice Espy, MD;  Location: [facility] OR[facility];  Service: Urology;  Laterality: Right;  . IVC FILTER REMOVAL N/A 08/07/2019   Procedure: IVC FILTER REMOVAL;  Surgeon: Katha Cabal, MD;  Location: [place] [facility];  Service: Cardiovascular;  Laterality: N/A;  .  MASS EXCISION     Throat  . PERIPHERAL VASCULAR THROMBECTOMY Left 06/05/2019   Procedure: PERIPHERAL VASCULAR THROMBECTOMY WITH IVC FILTER (LEFT LOWER EXTREMITY);  Surgeon: Katha Cabal, MD;  Location: [place] [facility];  Service: Cardiovascular;  Laterality: Left;  . PORTA CATH INSERTION N/A 06/06/2019   Procedure: PORTA CATH INSERTION;  Surgeon: Katha Cabal, MD;  Location: [place] [facility];  Service: Cardiovascular;  Laterality: N/A;  . TONSILLECTOMY      Past Gynecologic History:  History of abnormal Paps: Per HPI Contraception: Sexually active: Not currently Menarche: 8th grade Details: 5 days  History of OCP/HRT use:   OB History:  OB History  Gravida Para Term Preterm AB Living  '2 1     1 1  '$ SAB TAB Ectopic Multiple Live Births               # Outcome Date GA Lbr Len/2nd Weight Sex Delivery Anes PTL Lv  2 AB           1 Para             Family History: Family History  Problem Relation Age of Onset  . Brain cancer Mother 1  . Alcohol abuse Father   . Heart disease Father   . Prostate cancer Father   . Heart attack Father   . Diabetes Father     Social History: Social History   Socioeconomic History  . Marital status: Divorced    Spouse name: Not on file  . Number of children: Not on file  . Years of education: Not on file  . Highest education level: Not on file  Occupational History  . Occupation: [employer]    Employer: [employer] [place]  Tobacco Use  . Smoking status: Former Smoker    Packs/day: 0.50    Years: 30.00    Pack years: 15.00    Types: Cigarettes    Quit date: 07/03/2019    Years since quitting: 0.4  . Smokeless tobacco: Never Used  Substance and  Sexual Activity  . Alcohol use: Not Currently  . Drug use: Never  . Sexual activity: Yes    Comment: not at moment  Other Topics Concern  . Not on file  Social History Narrative  . Not on file   Social Determinants of Health   Financial Resource Strain: Low Risk   . Difficulty of Paying Living Expenses: Not hard at all  Food Insecurity: No Food Insecurity  . Worried About Charity fundraiser in the Last Year: Never true  . Ran Out of Food in the Last Year: Never true  Transportation Needs: No Transportation Needs  . Lack of Transportation (Medical): No  . Lack of Transportation (Non-Medical): No  Physical Activity: Sufficiently Active  . Days of Exercise per Week: 6 days  . Minutes of Exercise per Session: 40 min  Stress: No Stress Concern Present  . Feeling of Stress : Not at all  Social Connections: Moderately Isolated  . Frequency of Communication with Friends and Family: More than three times a week  . Frequency of Social Gatherings with Friends and Family: More than three times a week  . Attends Religious Services: Never  . Active Member of Clubs or Organizations: No  . Attends Archivist Meetings: Never  . Marital Status: Divorced  Human resources officer Violence: Not At Risk  . Fear of Current or Ex-Partner: No  . Emotionally Abused: No  . Physically Abused: No  .  Sexually Abused: No    Allergies: No Known Allergies  Current Medications: Current Outpatient Medications  Medication Sig Dispense Refill  . ALPRAZolam (XANAX) 0.25 MG tablet Take 1 tablet (0.25 mg total) by mouth 2 (two) times daily as needed for anxiety. (Patient taking differently: Take 0.25 mg by mouth 2 (two) times daily as needed for anxiety. Rarely takes) 45 tablet 0  . apixaban (ELIQUIS) 5 MG TABS tablet Take 1 tablet (5 mg total) by mouth 2 (two) times daily. 180 tablet 1  . dexamethasone (DECADRON) 4 MG tablet Take 1 tablet (4 mg total) by mouth daily. 14 tablet 0  . lactulose (CHRONULAC)  10 GM/15ML solution Take 15-30 mLs (10-20 g total) by mouth daily as needed (constipation). (Patient taking differently: Take 10-20 g by mouth 2 (two) times daily. ) 236 mL 0  . mirtazapine (REMERON) 7.5 MG tablet Take 1 tablet (7.5 mg total) by mouth at bedtime. 30 tablet 2  . morphine (MS CONTIN) 15 MG 12 hr tablet Take 1 tablet (15 mg total) by mouth every 8 (eight) hours. 90 tablet 0  . ondansetron (ZOFRAN) 8 MG tablet Take 1 tablet (8 mg total) by mouth every 8 (eight) hours as needed for nausea or vomiting. 45 tablet 0  . oxybutynin (DITROPAN-XL) 5 MG 24 hr tablet TAKE 1 TABLET (5 MG TOTAL) BY MOUTH AT BEDTIME 30 tablet 0  . oxyCODONE (OXY IR/ROXICODONE) 5 MG immediate release tablet Take 1-2 tablets (5-10 mg total) by mouth every 4 (four) hours as needed for severe pain. 60 tablet 0  . promethazine (PHENERGAN) 25 MG tablet Take 1 tablet (25 mg total) by mouth every 6 (six) hours as needed for nausea or vomiting. 30 tablet 0  . sodium phosphate (FLEET) 7-19 GM/118ML ENEM Place 133 mLs (1 enema total) rectally daily as needed for severe constipation. 2 enema 0  . tamsulosin (FLOMAX) 0.4 MG CAPS capsule TAKE 1 CAPSULE BY MOUTH EVERY DAY 30 capsule 1   No current facility-administered medications for this visit.   Facility-Administered Medications Ordered in Other Visits  Medication Dose Route Frequency Provider Last Rate Last Admin  . heparin lock flush 100 unit/mL  500 Units Intracatheter Once PRN Lloyd Huger, MD      . sodium chloride flush (NS) 0.9 % injection 10 mL  10 mL Intravenous PRN Lloyd Huger, MD   10 mL at 11/27/19 1028   Review of Systems General:  Fatigue, weakness Skin: no complaints Eyes: no complaints HEENT: no complaints Breasts: no complaints Pulmonary: no complaints Cardiac: no complaints Gastrointestinal: abdominal pain, dec appetite, n/v, constipation Genitourinary/Sexual: no complaints Ob/Gyn: no complaints Musculoskeletal: back pain Hematology:  no complaints Neurologic/Psych: no complaints   Objective:  Physical Examination:  Today's Vitals   12/12/19 1510  BP: 116/75  Pulse: 80  Resp: 16  Temp: 98.5 F (36.9 C)  TempSrc: Tympanic  Weight: 103 lb (46.7 kg)   Body mass index is 18.84 kg/m.  BP 116/75 (BP Location: Left Arm, Patient Position: Sitting)   Pulse 80   Temp 98.5 F (36.9 C) (Tympanic)   Resp 16   Wt 103 lb (46.7 kg) Comment: 1345 vital signs  BMI 18.84 kg/m     ECOG Performance Status: 2 - Symptomatic, <50% confined to bed  GENERAL: Patient is a well appearing female in no acute distress HEENT:  PERRL, neck supple with midline trachea. Thyroid without masses.  NODES:  No cervical, supraclavicular, axillary, or inguinal lymphadenopathy palpated.  LUNGS:  Clear to auscultation bilaterally.  No wheezes or rhonchi. HEART:  Regular rate and rhythm. No murmur appreciated. ABDOMEN:  Soft, nontender.  Positive, normoactive bowel sounds.  MSK:  No focal spinal tenderness to palpation. Full range of motion bilaterally in the upper extremities. EXTREMITIES:  No peripheral edema.   SKIN:  Clear with no obvious rashes or skin changes. No nail dyscrasia. NEURO:  Nonfocal. Well oriented.  Appropriate affect.  Pelvic: EGBUS: no lesions Cervix: no longer present - completely eroded Vagina: no lesions, the anterior vaginal and posterior vaginal walls are visible, but upper vaginal apex is completely absent. The was bloody fluid in the vaginal canal. Above the apex there is nodular vascular tumor ~3-4 cm, but not evidence of active bleeding. There is tumor located in the pelvis visible on exam but unable to visualize fully. There may be bleeding from a source higher than can be visualized. Based on exam - packing deferred.  Uterus: BME deferred Adnexa: BME deferred Rectovaginal: confirmatory   Lab Review Lab Results  Component Value Date   WBC 6.7 12/12/2019   HGB 7.8 (L) 12/12/2019   HCT 25.5 (L) 12/12/2019    MCV 95.5 12/12/2019   PLT 364 12/12/2019     Chemistry      Component Value Date/Time   NA 132 (L) 12/12/2019 1037   NA 138 04/09/2019 0918   K 3.6 12/12/2019 1037   CL 95 (L) 12/12/2019 1037   CO2 25 12/12/2019 1037   BUN 10 12/12/2019 1037   BUN 12 04/09/2019 0918   CREATININE 0.80 12/12/2019 1037      Component Value Date/Time   CALCIUM 10.6 (H) 12/12/2019 1037   ALKPHOS 142 (H) 12/12/2019 1037   AST 17 12/12/2019 1037   ALT 7 12/12/2019 1037   BILITOT 0.7 12/12/2019 1037   BILITOT 0.4 04/09/2019 0918      Radiologic Imaging: As per HPI and interval history    Assessment:  AARILYN DYE is a 54 y.o. 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  There is 2.2 cm mixed lytic and lucent lesion in the left sacrum, indeterminate, but metastatic disease not excluded.  Several 3-4 mm nodules identified in the lungs.  Also has area in the sacrum concerning for metastatic disease.Cervical biopsy only suggestive of microinvasion, but biopsy surely just was not deep enough as the tumor is all submucosal and up in the cervix. CT scan showed extensive bulky adenopathy and she was treated with primary radiation and cisplatin with excellent response overall, but new disease in chest and neck.  Then received 3 cycles of carbo/taxol with progression on CT in 11/20. Now receiving Keytruda s/p 2 cycles.  PD-L1 was found to be positive (3%, CPS is greater than 1).  Currently on pembrolizumab s/p 3 cycles - last on 12/11/2019.   Vaginal hemorrhage due to malignancy, concern for progression.  Symptomatic anemia due to vaginal hemorrhage requiring 3 U PRBCs  Pain symptoms due to malignancy, optimal control.  Ureteral stent in place on right due to hydronephrosis.   VTE, currently on Eliquis  Medical co-morbidities complicating care: VTE Plan:   Problem List Items  Addressed This Visit      Genitourinary   Cervical cancer ([place]) - Primary     Other   Goals of care, counseling/discussion    Other Visit Diagnoses    Vaginal bleeding  Unfortunately this is a challenging situation. She has h/o progressive disease currently on pembrolizumab (s/p 3 cycles), and currently receiving palliative radiation for vaginal bleeding. She is anemic due to bleeding, but hemodynamically stable. I discussed with Dr. Baruch Gouty. She is currently receiving 200 cGy per day. Since she was treated with radiatio therapy before he does not think she is a candidate for higher dosing. He is hopeful her bleeding will improve by the end of this week. She may need embolization either in the near future or at some point given the tumor load. We have recommended she follow up with Vascular surgeons, Dr. Melbourne Abts and/or Dr. Lucky Cowboy, for evaluation so that if she needs embolization they can have a plan in place and are aware of her status. I spoke with Dr. Melbourne Abts and Dr. Lucky Cowboy today and they are happy to help. She has seen Dr. Melbourne Abts before when she had the VTE and we will arrange an appointment for her.   I also spoke with Dr. Grayland Ormond and he will arrange for follow up labs and imaging. I recommended stopping Eliquis for the time being and assess if this will help control the bleeding. Her risk of recurrent VTE will increase but right now her more imminent issue is hemorrhage. Dr. Grayland Ormond will review this with Ms. Lehr.   Arrange for Palliative Care visit .    We reviewed that her cancer is most likely incurable, prognosis is poor, and important goals of care. Hopefully therapy can enhance duration of survival, manage cancer-related symptoms, specifically reduce her bleeding, and maximize quality of life. At this point we are not certain of her response to therapy and further discussion will be had once the radiologic results are available. We reviewed Hospice, DNAR, Advanced directives, and  HCPOA. She would like her son to be her HCPOA, they have not signed the paper work. She does not have Advanced directives. She is not quite ready to make a decision about DNAR. The conversation was had with Ms. Imelda Pillow NP, and me.   A total of 40 minutes were spent with the patient/family today; >50 % was spent in education, counseling and coordination of care for cervical cancer.  Keyunna Coco Gaetana Michaelis, MD  CC:  Dr. Georgianne Fick

## 2019-12-13 ENCOUNTER — Ambulatory Visit
Admission: RE | Admit: 2019-12-13 | Discharge: 2019-12-13 | Disposition: A | Discharge status: Home / Self Care | Payer: BC Managed Care – PPO | Source: Ambulatory Visit | Attending: Radiation Oncology | Admitting: Radiation Oncology

## 2019-12-13 ENCOUNTER — Inpatient Hospital Stay: Payer: BC Managed Care – PPO

## 2019-12-13 ENCOUNTER — Telehealth: Payer: Self-pay

## 2019-12-13 ENCOUNTER — Ambulatory Visit (INDEPENDENT_AMBULATORY_CARE_PROVIDER_SITE_OTHER): Payer: BC Managed Care – PPO | Admitting: Vascular Surgery

## 2019-12-13 ENCOUNTER — Encounter (INDEPENDENT_AMBULATORY_CARE_PROVIDER_SITE_OTHER): Payer: Self-pay | Admitting: Vascular Surgery

## 2019-12-13 ENCOUNTER — Other Ambulatory Visit: Payer: Self-pay

## 2019-12-13 VITALS — BP 109/76 | HR 121 | Resp 16 | Ht 62.0 in | Wt 101.0 lb

## 2019-12-13 DIAGNOSIS — C539 Malignant neoplasm of cervix uteri, unspecified: Secondary | ICD-10-CM | POA: Diagnosis not present

## 2019-12-13 DIAGNOSIS — I82422 Acute embolism and thrombosis of left iliac vein: Secondary | ICD-10-CM | POA: Diagnosis not present

## 2019-12-13 DIAGNOSIS — Z510 Encounter for antineoplastic radiation therapy: Secondary | ICD-10-CM | POA: Diagnosis not present

## 2019-12-13 DIAGNOSIS — N939 Abnormal uterine and vaginal bleeding, unspecified: Secondary | ICD-10-CM | POA: Diagnosis not present

## 2019-12-13 LAB — TYPE AND SCREEN
ABO/RH(D): A POS
Antibody Screen: NEGATIVE
Unit division: 0
Unit division: 0

## 2019-12-13 LAB — BPAM RBC
Blood Product Expiration Date: 202102282359
Blood Product Expiration Date: 202102282359
ISSUE DATE / TIME: 202102031155
ISSUE DATE / TIME: 202102031430
Unit Type and Rh: 6200
Unit Type and Rh: 6200

## 2019-12-13 NOTE — Telephone Encounter (Signed)
Faxed insurance information to [place] x4 attempts over two days. Continue to get failed faxed, "busy", "no answer". Called [facility] for alternate fax number. None available. They took my name/number and will get someone to call me back.

## 2019-12-13 NOTE — Telephone Encounter (Signed)
Nutrition  Patient was a no show for nutrition appointment today.  RD called patient to see if she wanted to reschedule or wanted a phone visit.  Patient reports that she will call RD back to reschedule.  Contact number provided.   Malia Corsi B. Zenia Resides, [place], [place] Registered Dietitian [phone] (pager)

## 2019-12-14 ENCOUNTER — Ambulatory Visit
Admission: RE | Admit: 2019-12-14 | Discharge: 2019-12-14 | Disposition: A | Discharge status: Home / Self Care | Payer: BC Managed Care – PPO | Source: Ambulatory Visit | Attending: Radiation Oncology | Admitting: Radiation Oncology

## 2019-12-14 ENCOUNTER — Other Ambulatory Visit: Payer: Self-pay

## 2019-12-14 ENCOUNTER — Telehealth: Payer: Self-pay

## 2019-12-14 DIAGNOSIS — Z510 Encounter for antineoplastic radiation therapy: Secondary | ICD-10-CM | POA: Diagnosis not present

## 2019-12-14 DIAGNOSIS — C539 Malignant neoplasm of cervix uteri, unspecified: Secondary | ICD-10-CM | POA: Diagnosis not present

## 2019-12-14 NOTE — Telephone Encounter (Signed)
Called and spoke with [facility]. Provided insurance information for [employer].

## 2019-12-14 NOTE — Progress Notes (Signed)
[place]  [phone] [phone]  ID: Mariah Mcmahon OB: Dec 08, 1965  MR#: [id number]  IRC#:[id number]  Patient Care Team: Venita Lick, NP as PCP - General (Nurse Practitioner) Clent Jacks, RN as Oncology Nurse Navigator   CHIEF COMPLAINT: Progressive stage IV cervical cancer  INTERVAL HISTORY: Patient returns to clinic today for repeat laboratory can further evaluation.  Her vaginal bleeding had improved this past week, but just this morning she has noticed an increased amount.  She continues to have worsening weakness and fatigue. She continues to have nausea and increased pain, particularly with urination. She has no neurologic complaints.  She denies any fevers.  She has no chest pain, shortness of breath, cough, or hemoptysis.  She denies any vomiting, constipation, or diarrhea.  Patient offers no further specific complaints today.  REVIEW OF SYSTEMS:   Review of Systems  Constitutional: Positive for malaise/fatigue. Negative for fever and weight loss.  Respiratory: Negative.  Negative for cough and shortness of breath.   Cardiovascular: Negative.  Negative for chest pain and leg swelling.  Gastrointestinal: Positive for nausea. Negative for abdominal pain and constipation.  Genitourinary: Positive for dysuria. Negative for flank pain and hematuria.       Vaginal bleeding.  Musculoskeletal: Negative.  Negative for back pain.  Skin: Negative.  Negative for rash.  Neurological: Positive for weakness. Negative for dizziness, focal weakness and headaches.  Psychiatric/Behavioral: The patient is nervous/anxious. The patient does not have insomnia.     As per HPI. Otherwise, a complete review of systems is negative.  PAST MEDICAL HISTORY: Past Medical History:  Diagnosis Date  . Anemia   . Cancer ([place])    cwervical cancer  . Cervical cancer ([place])   . No pertinent past medical history     PAST SURGICAL HISTORY: Past Surgical  History:  Procedure Laterality Date  . CYSTOSCOPY W/ URETERAL STENT PLACEMENT Right 10/02/2019   Procedure: CYSTOSCOPY WITH RETROGRADE PYELOGRAM/URETERAL STENT PLACEMENT;  Surgeon: Hollice Espy, MD;  Location: [facility];  Service: Urology;  Laterality: Right;  . IVC FILTER REMOVAL N/A 08/07/2019   Procedure: IVC FILTER REMOVAL;  Surgeon: Katha Cabal, MD;  Location: [place] [facility];  Service: Cardiovascular;  Laterality: N/A;  . MASS EXCISION     Throat  . PERIPHERAL VASCULAR THROMBECTOMY Left 06/05/2019   Procedure: PERIPHERAL VASCULAR THROMBECTOMY WITH IVC FILTER (LEFT LOWER EXTREMITY);  Surgeon: Katha Cabal, MD;  Location: [place] [facility];  Service: Cardiovascular;  Laterality: Left;  . PORTA CATH INSERTION N/A 06/06/2019   Procedure: PORTA CATH INSERTION;  Surgeon: Katha Cabal, MD;  Location: [place] [facility];  Service: Cardiovascular;  Laterality: N/A;  . TONSILLECTOMY      FAMILY HISTORY: Family History  Problem Relation Age of Onset  . Brain cancer Mother 89  . Alcohol abuse Father   . Heart disease Father   . Prostate cancer Father   . Heart attack Father   . Diabetes Father     ADVANCED DIRECTIVES (Y/N):  N  HEALTH MAINTENANCE: Social History   Tobacco Use  . Smoking status: Former Smoker    Packs/day: 0.50    Years: 30.00    Pack years: 15.00    Types: Cigarettes    Quit date: 07/03/2019    Years since quitting: 0.4  . Smokeless tobacco: Never Used  Substance Use Topics  . Alcohol use: Not Currently  . Drug use: Never     Colonoscopy:  PAP:  Bone density:  Lipid panel:  No Known Allergies  Current Outpatient Medications  Medication Sig Dispense Refill  . ALPRAZolam (XANAX) 0.25 MG tablet Take 1 tablet (0.25 mg total) by mouth 2 (two) times daily as needed for anxiety. 45 tablet 0  . apixaban (ELIQUIS) 5 MG TABS tablet Take 1 tablet (5 mg total) by mouth 2 (two) times daily. (Patient not taking: Reported on 12/13/2019) 180  tablet 1  . ciprofloxacin (CIPRO) 500 MG tablet Take 1 tablet (500 mg total) by mouth 2 (two) times daily. 14 tablet 0  . dexamethasone (DECADRON) 4 MG tablet Take 1 tablet (4 mg total) by mouth daily. (Patient not taking: Reported on 12/13/2019) 14 tablet 0  . lactulose (CHRONULAC) 10 GM/15ML solution Take 15-30 mLs (10-20 g total) by mouth daily as needed (constipation). (Patient not taking: Reported on 12/13/2019) 236 mL 0  . mirtazapine (REMERON) 7.5 MG tablet Take 1 tablet (7.5 mg total) by mouth at bedtime. (Patient not taking: Reported on 12/13/2019) 30 tablet 2  . morphine (MS CONTIN) 15 MG 12 hr tablet Take 1 tablet (15 mg total) by mouth every 8 (eight) hours. 90 tablet 0  . ondansetron (ZOFRAN) 8 MG tablet Take 1 tablet (8 mg total) by mouth every 8 (eight) hours as needed for nausea or vomiting. 45 tablet 0  . oxybutynin (DITROPAN-XL) 5 MG 24 hr tablet TAKE 1 TABLET (5 MG TOTAL) BY MOUTH AT BEDTIME 30 tablet 0  . oxyCODONE (OXY IR/ROXICODONE) 5 MG immediate release tablet Take 2 tablets (10 mg total) by mouth every 4 (four) hours as needed for severe pain. 60 tablet 0  . phenazopyridine (PYRIDIUM) 100 MG tablet Take 1 tablet (100 mg total) by mouth 3 (three) times daily as needed for pain. 10 tablet 0  . promethazine (PHENERGAN) 25 MG tablet Take 1 tablet (25 mg total) by mouth every 6 (six) hours as needed for nausea or vomiting. (Patient not taking: Reported on 12/13/2019) 30 tablet 0  . sodium phosphate (FLEET) 7-19 GM/118ML ENEM Place 133 mLs (1 enema total) rectally daily as needed for severe constipation. (Patient not taking: Reported on 12/13/2019) 2 enema 0  . tamsulosin (FLOMAX) 0.4 MG CAPS capsule TAKE 1 CAPSULE BY MOUTH EVERY DAY 30 capsule 1   No current facility-administered medications for this visit.   Facility-Administered Medications Ordered in Other Visits  Medication Dose Route Frequency Provider Last Rate Last Admin  . heparin lock flush 100 unit/mL  500 Units Intracatheter  Once PRN Lloyd Huger, MD      . sodium chloride flush (NS) 0.9 % injection 10 mL  10 mL Intravenous PRN Lloyd Huger, MD   10 mL at 11/27/19 1028    OBJECTIVE: Vitals:   12/19/19 0932  BP: 105/73  Pulse: (!) 113  Resp: 18  Temp: 97.8 F (36.6 C)  SpO2: 98%     Body mass index is 17.89 kg/m.    ECOG FS:2 - Symptomatic, <50% confined to bed  General: Thin, no acute distress. Eyes: Pink conjunctiva, anicteric sclera. HEENT: Normocephalic, moist mucous membranes. Lungs: No audible wheezing or coughing. Heart: Regular rate and rhythm. Abdomen: Soft, nontender, no obvious distention. Musculoskeletal: No edema, cyanosis, or clubbing. Neuro: Alert, answering all questions appropriately. Cranial nerves grossly intact. Skin: No rashes or petechiae noted. Psych: Normal affect.  LAB RESULTS:  Lab Results  Component Value Date   NA 133 (L) 12/18/2019   K 3.5 12/18/2019   CL 96 (L) 12/18/2019  CO2 25 12/18/2019   GLUCOSE 144 (H) 12/18/2019   BUN 11 12/18/2019   CREATININE 0.83 12/18/2019   CALCIUM 10.7 (H) 12/18/2019   PROT 6.8 12/18/2019   ALBUMIN 2.8 (L) 12/18/2019   AST 102 (H) 12/18/2019   ALT 31 12/18/2019   ALKPHOS 544 (H) 12/18/2019   BILITOT 1.2 12/18/2019   GFRNONAA >60 12/18/2019   GFRAA >60 12/18/2019    Lab Results  Component Value Date   WBC 8.0 12/18/2019   NEUTROABS 6.6 12/18/2019   HGB 10.6 (L) 12/18/2019   HCT 34.1 (L) 12/18/2019   MCV 90.5 12/18/2019   PLT 407 (H) 12/18/2019     STUDIES: No results found.  ASSESSMENT: Progressive stage IV cervical cancer.  PLAN:    1.  Progressive stage IV cervical cancer: Biopsy results from April 24, 2019 confirmed the diagnosis. PET scan on September 17, 2019 revealed progressive disease.  Initial plan was to enroll in clinical trial at [facility], but secondary to significant urine protein patient did not qualify.  PD-L1 was found to be positive (3%, CPS is greater than 1).  Patient received  cycle 3 of Keytruda last week.  Gynecologic exam from last week as well suggested possible progression of disease, will get CT scan prior to cycle 4 of Keytruda in 2 weeks. 2.  Hydronephrosis: Imaging as above.  Ureteral stent appears to be appropriately in place. 3.  Venous access: Patient now has had a port placed. 4.  Renal insufficiency: Resolved.   5.  Anemia: Significantly improved to 10.6, monitor. 6.  Abdominal/flank pain: Continue current narcotics as prescribed.  Appreciate palliative care input. 7.  DVT/PE: Diagnosed in July 2020.  Patient required stent placement and thrombolytics.  IVC filter has been removed.  Given extensive bleeding, Eliquis has been discontinued. 8.  Thrombocytopenia: Resolved. 9.  Anxiety: Continue Xanax as prescribed. 10.  Poor appetite/insomnia: Continue Remeron. 11.  Vaginal bleeding: Patient has now completed XRT.  Eliquis has been discontinued.  Refer back to vascular surgery for consideration of embolization. 12.  Constipation: Resolved.  Continue current bowel regimen. 13. Burning with urination: Patient was given a prescription for antibiotic and Pyridium. 14.  Declining performance status: Patient expressed understanding that there may be no additional treatment options, will reevaluate after CT scan as above.  Appreciate palliative care input. 15.  Hypercalcemia: Likely secondary to underlying malignancy, consider Zometa in the future.  Patient expressed understanding and was in agreement with this plan. She also understands that She can call clinic at any time with any questions, concerns, or complaints.   Cancer Staging Cervical cancer [facility]) Staging form: Cervix Uteri, AJCC 8th Edition - Clinical stage from 11/17/2019: FIGO Stage IVB (cT3b, cN1, pM1) - Signed by Lloyd Huger, MD on 11/17/2019   Lloyd Huger, MD   12/20/2019 11:26 AM

## 2019-12-16 ENCOUNTER — Encounter (INDEPENDENT_AMBULATORY_CARE_PROVIDER_SITE_OTHER): Payer: Self-pay | Admitting: Vascular Surgery

## 2019-12-16 DIAGNOSIS — N939 Abnormal uterine and vaginal bleeding, unspecified: Secondary | ICD-10-CM | POA: Insufficient documentation

## 2019-12-16 NOTE — Progress Notes (Signed)
MRN : [id number]  Mariah Mcmahon is a 54 y.o. (31-Mar-1966) female who presents with chief complaint of No chief complaint on file. Marland Kitchen  History of Present Illness:   The patient was recently admitted to [facility] secondary to heavy dysfunctional uterine bleeding.  She did require 3 unit transfusion.  At that time the decision was made to stop her Eliquis.  Furthermore she has restarted radiation treatments with the hope of controlling her bleeding.  Today she notes that she has had just a small amount of spotting.  She is continuing her radiation treatments and seems to be tolerating these well.  She denies any increase in her leg swelling or leg pain.  Current Meds  Medication Sig  . ALPRAZolam (XANAX) 0.25 MG tablet Take 1 tablet (0.25 mg total) by mouth 2 (two) times daily as needed for anxiety.  Marland Kitchen morphine (MS CONTIN) 15 MG 12 hr tablet Take 1 tablet (15 mg total) by mouth every 8 (eight) hours.  . ondansetron (ZOFRAN) 8 MG tablet Take 1 tablet (8 mg total) by mouth every 8 (eight) hours as needed for nausea or vomiting.  Marland Kitchen oxybutynin (DITROPAN-XL) 5 MG 24 hr tablet TAKE 1 TABLET (5 MG TOTAL) BY MOUTH AT BEDTIME  . oxyCODONE (OXY IR/ROXICODONE) 5 MG immediate release tablet Take 1-2 tablets (5-10 mg total) by mouth every 4 (four) hours as needed for severe pain.  . tamsulosin (FLOMAX) 0.4 MG CAPS capsule TAKE 1 CAPSULE BY MOUTH EVERY DAY    Past Medical History:  Diagnosis Date  . Anemia   . Cancer ([place])    cwervical cancer  . Cervical cancer ([place])   . No pertinent past medical history     Past Surgical History:  Procedure Laterality Date  . CYSTOSCOPY W/ URETERAL STENT PLACEMENT Right 10/02/2019   Procedure: CYSTOSCOPY WITH RETROGRADE PYELOGRAM/URETERAL STENT PLACEMENT;  Surgeon: Hollice Espy, MD;  Location: [facility];  Service: Urology;  Laterality: Right;  . IVC FILTER REMOVAL N/A 08/07/2019   Procedure: IVC FILTER REMOVAL;  Surgeon: Katha Cabal, MD;  Location: [place] [facility];  Service: Cardiovascular;  Laterality: N/A;  . MASS EXCISION     Throat  . PERIPHERAL VASCULAR THROMBECTOMY Left 06/05/2019   Procedure: PERIPHERAL VASCULAR THROMBECTOMY WITH IVC FILTER (LEFT LOWER EXTREMITY);  Surgeon: Katha Cabal, MD;  Location: [place] [facility];  Service: Cardiovascular;  Laterality: Left;  . PORTA CATH INSERTION N/A 06/06/2019   Procedure: PORTA CATH INSERTION;  Surgeon: Katha Cabal, MD;  Location: [place] [facility];  Service: Cardiovascular;  Laterality: N/A;  . TONSILLECTOMY      Social History Social History   Tobacco Use  . Smoking status: Former Smoker    Packs/day: 0.50    Years: 30.00    Pack years: 15.00    Types: Cigarettes    Quit date: 07/03/2019    Years since quitting: 0.4  . Smokeless tobacco: Never Used  Substance Use Topics  . Alcohol use: Not Currently  . Drug use: Never    Family History Family History  Problem Relation Age of Onset  . Brain cancer Mother 70  . Alcohol abuse Father   . Heart disease Father   . Prostate cancer Father   . Heart attack Father   . Diabetes Father     No Known Allergies   REVIEW OF SYSTEMS (Negative unless checked)  Constitutional: [] Weight loss  [] Fever  [] Chills Cardiac: [] Chest pain   [] Chest pressure   []   Palpitations   [] Shortness of breath when laying flat   [] Shortness of breath with exertion. Vascular:  [] Pain in legs with walking   [] Pain in legs at rest  [x] History of DVT   [] Phlebitis   [] Swelling in legs   [] Varicose veins   [] Non-healing ulcers Pulmonary:   [] Uses home oxygen   [] Productive cough   [] Hemoptysis   [] Wheeze  [] COPD   [] Asthma Neurologic:  [] Dizziness   [] Seizures   [] History of stroke   [] History of TIA  [] Aphasia   [] Vissual changes   [] Weakness or numbness in arm   [] Weakness or numbness in leg Musculoskeletal:   [] Joint swelling   [] Joint pain   [] Low back pain Hematologic:  [] Easy bruising  [] Easy bleeding   [] Hypercoagulable state    [x] Anemic Gastrointestinal:  [] Diarrhea   [] Vomiting  [] Gastroesophageal reflux/heartburn   [] Difficulty swallowing. Genitourinary:  [] Chronic kidney disease   [] Difficult urination  [] Frequent urination   [] Blood in urine Skin:  [] Rashes   [] Ulcers  Psychological:  [] History of anxiety   []  History of major depression.  Physical Examination  Vitals:   12/13/19 1427  BP: 109/76  Pulse: (!) 121  Resp: 16  Weight: 101 lb (45.8 kg)  Height: 5\' 2"  (1.575 m)   Body mass index is 18.47 kg/m. Gen: WD/WN, NAD Head: [place]/AT, No temporalis wasting.  Ear/Nose/Throat: Hearing grossly intact, nares w/o erythema or drainage Eyes: PER, EOMI, sclera nonicteric.  Neck: Supple, no large masses.   Pulmonary:  Good air movement, no audible wheezing bilaterally, no use of accessory muscles.  Cardiac: RRR, no JVD Vascular:  Vessel Right Left  Radial Palpable Palpable  Gastrointestinal: Non-distended. No guarding/no peritoneal signs.  Musculoskeletal: M/S 5/5 throughout.  No deformity or atrophy.  Neurologic: CN 2-12 intact. Symmetrical.  Speech is fluent. Motor exam as listed above. Psychiatric: Judgment intact, Mood & affect appropriate for pt's clinical situation. Dermatologic: No rashes or ulcers noted.  No changes consistent with cellulitis. Lymph : No lichenification or skin changes of chronic lymphedema.  CBC Lab Results  Component Value Date   WBC 6.7 12/12/2019   HGB 9.9 (L) 12/12/2019   HCT 25.5 (L) 12/12/2019   MCV 95.5 12/12/2019   PLT 364 12/12/2019    BMET    Component Value Date/Time   NA 132 (L) 12/12/2019 1037   NA 138 04/09/2019 0918   K 3.6 12/12/2019 1037   CL 95 (L) 12/12/2019 1037   CO2 25 12/12/2019 1037   GLUCOSE 116 (H) 12/12/2019 1037   BUN 10 12/12/2019 1037   BUN 12 04/09/2019 0918   CREATININE 0.80 12/12/2019 1037   CALCIUM 10.6 (H) 12/12/2019 1037   GFRNONAA >60 12/12/2019 1037   GFRAA >60 12/12/2019 1037   Estimated Creatinine Clearance: 60.2 mL/min  (by C-G formula based on SCr of 0.8 mg/dL).  COAG Lab Results  Component Value Date   INR 1.1 06/05/2019    Radiology No results found.   Assessment/Plan 1. Abnormal uterine bleeding I concur that we should stop her anticoagulation.  I have discussed with her an IVC filter but at this time we have both agreed not to place a filter.  If she does bleed again I have reviewed the plan to perform embolization if her bleeding picks up.  Risk and benefits were reviewed the patient.  Indications for the procedure were reviewed.  All questions were answered, the patient agrees to proceed with angiography and embolization if needed.   2. Acute deep vein thrombosis (  DVT) of iliac vein of left lower extremity ([place]) See #1  Hortencia Pilar, MD  12/16/2019 5:05 PM

## 2019-12-17 ENCOUNTER — Ambulatory Visit
Admission: RE | Admit: 2019-12-17 | Discharge: 2019-12-17 | Disposition: A | Discharge status: Home / Self Care | Payer: BC Managed Care – PPO | Source: Ambulatory Visit | Attending: Radiation Oncology | Admitting: Radiation Oncology

## 2019-12-17 ENCOUNTER — Other Ambulatory Visit: Payer: Self-pay

## 2019-12-17 DIAGNOSIS — Z510 Encounter for antineoplastic radiation therapy: Secondary | ICD-10-CM | POA: Diagnosis not present

## 2019-12-17 DIAGNOSIS — C539 Malignant neoplasm of cervix uteri, unspecified: Secondary | ICD-10-CM | POA: Diagnosis not present

## 2019-12-18 ENCOUNTER — Ambulatory Visit
Admission: RE | Admit: 2019-12-18 | Discharge: 2019-12-18 | Disposition: A | Discharge status: Home / Self Care | Payer: BC Managed Care – PPO | Source: Ambulatory Visit | Attending: Radiation Oncology | Admitting: Radiation Oncology

## 2019-12-18 ENCOUNTER — Other Ambulatory Visit: Payer: Self-pay | Admitting: Emergency Medicine

## 2019-12-18 ENCOUNTER — Encounter: Payer: Self-pay | Admitting: Oncology

## 2019-12-18 ENCOUNTER — Inpatient Hospital Stay: Payer: BC Managed Care – PPO

## 2019-12-18 ENCOUNTER — Other Ambulatory Visit: Payer: Self-pay

## 2019-12-18 DIAGNOSIS — Z5112 Encounter for antineoplastic immunotherapy: Secondary | ICD-10-CM | POA: Diagnosis not present

## 2019-12-18 DIAGNOSIS — D500 Iron deficiency anemia secondary to blood loss (chronic): Secondary | ICD-10-CM | POA: Diagnosis not present

## 2019-12-18 DIAGNOSIS — R110 Nausea: Secondary | ICD-10-CM | POA: Diagnosis not present

## 2019-12-18 DIAGNOSIS — R300 Dysuria: Secondary | ICD-10-CM | POA: Diagnosis not present

## 2019-12-18 DIAGNOSIS — G893 Neoplasm related pain (acute) (chronic): Secondary | ICD-10-CM | POA: Diagnosis not present

## 2019-12-18 DIAGNOSIS — R5383 Other fatigue: Secondary | ICD-10-CM | POA: Diagnosis not present

## 2019-12-18 DIAGNOSIS — F419 Anxiety disorder, unspecified: Secondary | ICD-10-CM | POA: Diagnosis not present

## 2019-12-18 DIAGNOSIS — C530 Malignant neoplasm of endocervix: Secondary | ICD-10-CM | POA: Diagnosis not present

## 2019-12-18 DIAGNOSIS — R531 Weakness: Secondary | ICD-10-CM | POA: Diagnosis not present

## 2019-12-18 DIAGNOSIS — Z66 Do not resuscitate: Secondary | ICD-10-CM | POA: Diagnosis not present

## 2019-12-18 DIAGNOSIS — Z510 Encounter for antineoplastic radiation therapy: Secondary | ICD-10-CM | POA: Diagnosis not present

## 2019-12-18 DIAGNOSIS — C7989 Secondary malignant neoplasm of other specified sites: Secondary | ICD-10-CM | POA: Diagnosis not present

## 2019-12-18 DIAGNOSIS — Z515 Encounter for palliative care: Secondary | ICD-10-CM | POA: Diagnosis not present

## 2019-12-18 DIAGNOSIS — C7951 Secondary malignant neoplasm of bone: Secondary | ICD-10-CM | POA: Diagnosis not present

## 2019-12-18 DIAGNOSIS — C539 Malignant neoplasm of cervix uteri, unspecified: Secondary | ICD-10-CM

## 2019-12-18 DIAGNOSIS — C7800 Secondary malignant neoplasm of unspecified lung: Secondary | ICD-10-CM | POA: Diagnosis not present

## 2019-12-18 DIAGNOSIS — C778 Secondary and unspecified malignant neoplasm of lymph nodes of multiple regions: Secondary | ICD-10-CM | POA: Diagnosis not present

## 2019-12-18 LAB — COMPREHENSIVE METABOLIC PANEL
ALT: 31 U/L (ref 0–44)
AST: 102 U/L — ABNORMAL HIGH (ref 15–41)
Albumin: 2.8 g/dL — ABNORMAL LOW (ref 3.5–5.0)
Alkaline Phosphatase: 544 U/L — ABNORMAL HIGH (ref 38–126)
Anion gap: 12 (ref 5–15)
BUN: 11 mg/dL (ref 6–20)
CO2: 25 mmol/L (ref 22–32)
Calcium: 10.7 mg/dL — ABNORMAL HIGH (ref 8.9–10.3)
Chloride: 96 mmol/L — ABNORMAL LOW (ref 98–111)
Creatinine, Ser: 0.83 mg/dL (ref 0.44–1.00)
GFR calc Af Amer: >60 mL/min (ref >60)
GFR calc non Af Amer: >60 mL/min (ref >60)
Glucose, Bld: 144 mg/dL — ABNORMAL HIGH (ref 70–99)
Potassium: 3.5 mmol/L (ref 3.5–5.1)
Sodium: 133 mmol/L — ABNORMAL LOW (ref 135–145)
Total Bilirubin: 1.2 mg/dL (ref 0.3–1.2)
Total Protein: 6.8 g/dL (ref 6.5–8.1)

## 2019-12-18 LAB — CBC WITH DIFFERENTIAL/PLATELET
Abs Immature Granulocytes: 0.06 10*3/uL (ref 0.00–0.07)
Basophils Absolute: 0 10*3/uL (ref 0.0–0.1)
Basophils Relative: 0 %
Eosinophils Absolute: 0.2 10*3/uL (ref 0.0–0.5)
Eosinophils Relative: 2 %
HCT: 34.1 % — ABNORMAL LOW (ref 36.0–46.0)
Hemoglobin: 10.6 g/dL — ABNORMAL LOW (ref 12.0–15.0)
Immature Granulocytes: 1 %
Lymphocytes Relative: 4 %
Lymphs Abs: 0.3 10*3/uL — ABNORMAL LOW (ref 0.7–4.0)
MCH: 28.1 pg (ref 26.0–34.0)
MCHC: 31.1 g/dL (ref 30.0–36.0)
MCV: 90.5 fL (ref 80.0–100.0)
Monocytes Absolute: 0.9 10*3/uL (ref 0.1–1.0)
Monocytes Relative: 11 %
Neutro Abs: 6.6 10*3/uL (ref 1.7–7.7)
Neutrophils Relative %: 82 %
Platelets: 407 10*3/uL — ABNORMAL HIGH (ref 150–400)
RBC: 3.77 MIL/uL — ABNORMAL LOW (ref 3.87–5.11)
RDW: 16.7 % — ABNORMAL HIGH (ref 11.5–15.5)
WBC: 8 10*3/uL (ref 4.0–10.5)
nRBC: 0 % (ref 0.0–0.2)

## 2019-12-18 LAB — SAMPLE TO BLOOD BANK

## 2019-12-18 MED ORDER — OXYCODONE HCL 5 MG PO TABS: 10.0000 mg | ORAL_TABLET | ORAL | 0 refills | Status: DC | PRN

## 2019-12-18 NOTE — Progress Notes (Signed)
Pt prescreened for appt. Reports increased pain with urination. Only having light spotting.

## 2019-12-19 ENCOUNTER — Inpatient Hospital Stay: Payer: BC Managed Care – PPO

## 2019-12-19 ENCOUNTER — Inpatient Hospital Stay (HOSPITAL_BASED_OUTPATIENT_CLINIC_OR_DEPARTMENT_OTHER): Payer: BC Managed Care – PPO | Admitting: Hospice and Palliative Medicine

## 2019-12-19 ENCOUNTER — Inpatient Hospital Stay (HOSPITAL_BASED_OUTPATIENT_CLINIC_OR_DEPARTMENT_OTHER): Payer: BC Managed Care – PPO | Admitting: Oncology

## 2019-12-19 ENCOUNTER — Ambulatory Visit
Admission: RE | Admit: 2019-12-19 | Discharge: 2019-12-19 | Disposition: A | Discharge status: Home / Self Care | Payer: BC Managed Care – PPO | Source: Ambulatory Visit | Attending: Radiation Oncology | Admitting: Radiation Oncology

## 2019-12-19 ENCOUNTER — Other Ambulatory Visit: Payer: Self-pay

## 2019-12-19 VITALS — BP 105/73 | HR 113 | Temp 97.8°F | Resp 18 | Wt 97.8 lb

## 2019-12-19 DIAGNOSIS — Z5112 Encounter for antineoplastic immunotherapy: Secondary | ICD-10-CM | POA: Diagnosis not present

## 2019-12-19 DIAGNOSIS — C538 Malignant neoplasm of overlapping sites of cervix uteri: Secondary | ICD-10-CM | POA: Diagnosis not present

## 2019-12-19 DIAGNOSIS — C7800 Secondary malignant neoplasm of unspecified lung: Secondary | ICD-10-CM | POA: Diagnosis not present

## 2019-12-19 DIAGNOSIS — Z66 Do not resuscitate: Secondary | ICD-10-CM | POA: Diagnosis not present

## 2019-12-19 DIAGNOSIS — R531 Weakness: Secondary | ICD-10-CM | POA: Diagnosis not present

## 2019-12-19 DIAGNOSIS — C778 Secondary and unspecified malignant neoplasm of lymph nodes of multiple regions: Secondary | ICD-10-CM | POA: Diagnosis not present

## 2019-12-19 DIAGNOSIS — R110 Nausea: Secondary | ICD-10-CM | POA: Diagnosis not present

## 2019-12-19 DIAGNOSIS — C7989 Secondary malignant neoplasm of other specified sites: Secondary | ICD-10-CM | POA: Diagnosis not present

## 2019-12-19 DIAGNOSIS — G893 Neoplasm related pain (acute) (chronic): Secondary | ICD-10-CM | POA: Diagnosis not present

## 2019-12-19 DIAGNOSIS — R5383 Other fatigue: Secondary | ICD-10-CM | POA: Diagnosis not present

## 2019-12-19 DIAGNOSIS — C530 Malignant neoplasm of endocervix: Secondary | ICD-10-CM | POA: Diagnosis not present

## 2019-12-19 DIAGNOSIS — Z515 Encounter for palliative care: Secondary | ICD-10-CM | POA: Diagnosis not present

## 2019-12-19 DIAGNOSIS — D500 Iron deficiency anemia secondary to blood loss (chronic): Secondary | ICD-10-CM | POA: Diagnosis not present

## 2019-12-19 DIAGNOSIS — R300 Dysuria: Secondary | ICD-10-CM

## 2019-12-19 DIAGNOSIS — Z510 Encounter for antineoplastic radiation therapy: Secondary | ICD-10-CM | POA: Diagnosis not present

## 2019-12-19 DIAGNOSIS — F419 Anxiety disorder, unspecified: Secondary | ICD-10-CM | POA: Diagnosis not present

## 2019-12-19 DIAGNOSIS — C539 Malignant neoplasm of cervix uteri, unspecified: Secondary | ICD-10-CM | POA: Diagnosis not present

## 2019-12-19 DIAGNOSIS — C7951 Secondary malignant neoplasm of bone: Secondary | ICD-10-CM | POA: Diagnosis not present

## 2019-12-19 MED ORDER — CIPROFLOXACIN HCL 500 MG PO TABS: 500.0000 mg | ORAL_TABLET | Freq: Two times a day (BID) | ORAL | 0 refills | Status: AC

## 2019-12-19 MED ORDER — PHENAZOPYRIDINE HCL 100 MG PO TABS: 100.0000 mg | ORAL_TABLET | Freq: Three times a day (TID) | ORAL | 0 refills | Status: AC | PRN

## 2019-12-19 NOTE — Progress Notes (Signed)
[place]  [phone] [phone]   Name: Mariah Mcmahon Date: 12/19/2019 MRN: [id number]  DOB: Dec 15, 1965  Patient Care Team: Venita Lick, NP as PCP - General (Nurse Practitioner) Clent Jacks, RN as Oncology Nurse Navigator    REASON FOR CONSULTATION: [name] is a 54 y.o. 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  Patient is felt not to be a surgical candidate and treatment was initiated with concurrent chemoradiation.  Patient was hospitalized 06/05/2019 -06/07/2019 with acute left lower extremity DVT/pulmonary embolism status post thrombolysis and IVC filter placement.  IVC filter was later removed.  Patient had disease progression on carbotaxol and was referred to [facility] for a clinical trial.  PET on 09/17/2019, which revealed interval progression of hypermetabolic nodal disease in chest, abdomen, and pelvis with new hypermetabolic disease in the region of the right vaginal fornix/cervix. Decision was made to proceed with systemic chemotherapy. CT scan on 12/21 revealed mixed response to treatment. Patient was rotated to [facility].  She has had vaginal bleeding and was started on RT.  Patient was referred to palliative care to help address goals and manage ongoing symptoms.  SOCIAL HISTORY:     reports that she quit smoking about 5 months ago. Her smoking use included cigarettes. She has a 15.00 pack-year smoking history. She has never used smokeless tobacco. She reports previous alcohol use. She reports that she does not use drugs.  Patient is not married.  She lives at home alone.  She has a son who is involved in her care, who lives nearby.  Patient previously worked at [employer] in [place] and recently stopped working due to [employer].  ADVANCE DIRECTIVES:  Does not have  CODE STATUS: DNR/DNI (DNR form  completed on 12/12/19)  PAST MEDICAL HISTORY: Past Medical History:  Diagnosis Date  . Anemia   . Cancer ([place])    cwervical cancer  . Cervical cancer ([place])   . No pertinent past medical history     PAST SURGICAL HISTORY:  Past Surgical History:  Procedure Laterality Date  . CYSTOSCOPY W/ URETERAL STENT PLACEMENT Right 10/02/2019   Procedure: CYSTOSCOPY WITH RETROGRADE PYELOGRAM/URETERAL STENT PLACEMENT;  Surgeon: Hollice Espy, MD;  Location: [facility] ORS;  Service: Urology;  Laterality: Right;  . IVC FILTER REMOVAL N/A 08/07/2019   Procedure: IVC FILTER REMOVAL;  Surgeon: Katha Cabal, MD;  Location: [place] [facility];  Service: Cardiovascular;  Laterality: N/A;  . MASS EXCISION     Throat  . PERIPHERAL VASCULAR THROMBECTOMY Left 06/05/2019   Procedure: PERIPHERAL VASCULAR THROMBECTOMY WITH IVC FILTER (LEFT LOWER EXTREMITY);  Surgeon: Katha Cabal, MD;  Location: [place] [facility];  Service: Cardiovascular;  Laterality: Left;  . PORTA CATH INSERTION N/A 06/06/2019   Procedure: PORTA CATH INSERTION;  Surgeon: Katha Cabal, MD;  Location: [place] [facility];  Service: Cardiovascular;  Laterality: N/A;  . TONSILLECTOMY      HEMATOLOGY/ONCOLOGY HISTORY:  Oncology History  Cervical cancer ([place])  05/04/2019 Initial Diagnosis   Cervical cancer ([place])   05/17/2019 - 07/04/2019 Chemotherapy   The patient had palonosetron (ALOXI) injection 0.25 mg, 0.25 mg, Intravenous,  Once, 6 of 7 cycles Administration: 0.25 mg (05/17/2019), 0.25 mg (05/24/2019), 0.25 mg (05/31/2019), 0.25 mg (06/14/2019), 0.25 mg (06/22/2019), 0.25 mg (06/28/2019) CISplatin (PLATINOL) 61 mg in sodium chloride 0.9 % 250 mL chemo infusion, 40 mg/m2 = 61  mg, Intravenous,  Once, 6 of 7 cycles Administration: 61 mg (05/17/2019), 61 mg (05/24/2019), 61 mg (05/31/2019), 61 mg (06/14/2019), 61 mg (06/22/2019), 61 mg (06/28/2019) fosaprepitant (EMEND) 150 mg, dexamethasone (DECADRON) 12 mg in sodium chloride 0.9 % 145 mL IVPB,  , Intravenous,  Once, 6 of 7 cycles Administration:  (05/17/2019),  (05/24/2019),  (05/31/2019),  (06/14/2019),  (06/22/2019),  (06/28/2019)  for chemotherapy treatment.    07/20/2019 - 09/20/2019 Chemotherapy   The patient had palonosetron (ALOXI) injection 0.25 mg, 0.25 mg, Intravenous,  Once, 3 of 6 cycles Administration: 0.25 mg (07/20/2019), 0.25 mg (08/10/2019), 0.25 mg (08/31/2019) pegfilgrastim (NEULASTA ONPRO KIT) injection 6 mg, 6 mg, Subcutaneous, Once, 2 of 5 cycles Administration: 6 mg (07/20/2019), 6 mg (08/31/2019) CARBOplatin (PARAPLATIN) 520 mg in sodium chloride 0.9 % 250 mL chemo infusion, 520 mg (95.1 % of original dose 541.8 mg), Intravenous,  Once, 3 of 6 cycles Dose modification:   (original dose 541.8 mg, Cycle 1) Administration: 520 mg (07/20/2019), 520 mg (08/10/2019), 520 mg (08/31/2019) PACLitaxel (TAXOL) 300 mg in sodium chloride 0.9 % 250 mL chemo infusion (> '80mg'$ /m2), 200 mg/m2 = 300 mg, Intravenous,  Once, 3 of 6 cycles Administration: 300 mg (07/20/2019), 300 mg (08/10/2019), 300 mg (08/31/2019) fosaprepitant (EMEND) 150 mg, dexamethasone (DECADRON) 12 mg in sodium chloride 0.9 % 145 mL IVPB, , Intravenous,  Once, 3 of 6 cycles Administration:  (07/20/2019),  (08/10/2019),  (08/31/2019)  for chemotherapy treatment.    10/30/2019 -  Chemotherapy   The patient had pembrolizumab (KEYTRUDA) 200 mg in sodium chloride 0.9 % 50 mL chemo infusion, 200 mg, Intravenous, Once, 3 of 6 cycles Administration: 200 mg (10/30/2019), 200 mg (12/11/2019), 200 mg (11/20/2019)  for chemotherapy treatment.    11/17/2019 Cancer Staging   Staging form: Cervix Uteri, AJCC 8th Edition - Clinical stage from 11/17/2019: FIGO Stage IVB (cT3b, cN1, pM1) - Signed by Lloyd Huger, MD on 11/17/2019     ALLERGIES:  has No Known Allergies.  MEDICATIONS:  Current Outpatient Medications  Medication Sig Dispense Refill  . ALPRAZolam (XANAX) 0.25 MG tablet Take 1 tablet (0.25 mg total) by mouth 2 (two) times  daily as needed for anxiety. 45 tablet 0  . apixaban (ELIQUIS) 5 MG TABS tablet Take 1 tablet (5 mg total) by mouth 2 (two) times daily. (Patient not taking: Reported on 12/13/2019) 180 tablet 1  . dexamethasone (DECADRON) 4 MG tablet Take 1 tablet (4 mg total) by mouth daily. (Patient not taking: Reported on 12/13/2019) 14 tablet 0  . lactulose (CHRONULAC) 10 GM/15ML solution Take 15-30 mLs (10-20 g total) by mouth daily as needed (constipation). (Patient not taking: Reported on 12/13/2019) 236 mL 0  . mirtazapine (REMERON) 7.5 MG tablet Take 1 tablet (7.5 mg total) by mouth at bedtime. (Patient not taking: Reported on 12/13/2019) 30 tablet 2  . morphine (MS CONTIN) 15 MG 12 hr tablet Take 1 tablet (15 mg total) by mouth every 8 (eight) hours. 90 tablet 0  . ondansetron (ZOFRAN) 8 MG tablet Take 1 tablet (8 mg total) by mouth every 8 (eight) hours as needed for nausea or vomiting. 45 tablet 0  . oxybutynin (DITROPAN-XL) 5 MG 24 hr tablet TAKE 1 TABLET (5 MG TOTAL) BY MOUTH AT BEDTIME 30 tablet 0  . oxyCODONE (OXY IR/ROXICODONE) 5 MG immediate release tablet Take 2 tablets (10 mg total) by mouth every 4 (four) hours as needed for severe pain. 60 tablet 0  . promethazine (PHENERGAN) 25 MG tablet Take 1  tablet (25 mg total) by mouth every 6 (six) hours as needed for nausea or vomiting. (Patient not taking: Reported on 12/13/2019) 30 tablet 0  . sodium phosphate (FLEET) 7-19 GM/118ML ENEM Place 133 mLs (1 enema total) rectally daily as needed for severe constipation. (Patient not taking: Reported on 12/13/2019) 2 enema 0  . tamsulosin (FLOMAX) 0.4 MG CAPS capsule TAKE 1 CAPSULE BY MOUTH EVERY DAY 30 capsule 1   No current facility-administered medications for this visit.   Facility-Administered Medications Ordered in Other Visits  Medication Dose Route Frequency Provider Last Rate Last Admin  . heparin lock flush 100 unit/mL  500 Units Intracatheter Once PRN Lloyd Huger, MD      . sodium chloride flush  (NS) 0.9 % injection 10 mL  10 mL Intravenous PRN Lloyd Huger, MD   10 mL at 11/27/19 1028    VITAL SIGNS: There were no vitals taken for this visit. There were no vitals filed for this visit.  Estimated body mass index is 17.89 kg/m as calculated from the following:   Height as of 12/13/19: '5\' 2"'$  (1.575 m).   Weight as of an earlier encounter on 12/19/19: 97 lb 12.8 oz (44.4 kg).  LABS: CBC:    Component Value Date/Time   WBC 8.0 12/18/2019 1102   HGB 10.6 (L) 12/18/2019 1102   HGB 14.5 04/09/2019 0918   HCT 34.1 (L) 12/18/2019 1102   HCT 44.2 04/09/2019 0918   PLT 407 (H) 12/18/2019 1102   PLT 401 04/09/2019 0918   MCV 90.5 12/18/2019 1102   MCV 99 (H) 04/09/2019 0918   NEUTROABS 6.6 12/18/2019 1102   NEUTROABS 5.9 04/09/2019 0918   LYMPHSABS 0.3 (L) 12/18/2019 1102   LYMPHSABS 1.3 04/09/2019 0918   MONOABS 0.9 12/18/2019 1102   EOSABS 0.2 12/18/2019 1102   EOSABS 0.2 04/09/2019 0918   BASOSABS 0.0 12/18/2019 1102   BASOSABS 0.1 04/09/2019 0918   Comprehensive Metabolic Panel:    Component Value Date/Time   NA 133 (L) 12/18/2019 1102   NA 138 04/09/2019 0918   K 3.5 12/18/2019 1102   CL 96 (L) 12/18/2019 1102   CO2 25 12/18/2019 1102   BUN 11 12/18/2019 1102   BUN 12 04/09/2019 0918   CREATININE 0.83 12/18/2019 1102   GLUCOSE 144 (H) 12/18/2019 1102   CALCIUM 10.7 (H) 12/18/2019 1102   AST 102 (H) 12/18/2019 1102   ALT 31 12/18/2019 1102   ALKPHOS 544 (H) 12/18/2019 1102   BILITOT 1.2 12/18/2019 1102   BILITOT 0.4 04/09/2019 0918   PROT 6.8 12/18/2019 1102   PROT 6.8 04/09/2019 0918   ALBUMIN 2.8 (L) 12/18/2019 1102   ALBUMIN 4.0 04/09/2019 0918    RADIOGRAPHIC STUDIES: No results found.  PERFORMANCE STATUS (ECOG) : 1 - Symptomatic but completely ambulatory  Review of Systems Unless otherwise noted, a complete review of systems is negative.  Physical Exam General: NAD, frail appearing, thin Pulmonary: Unlabored Extremities: no edema Skin:  no rashes Neurological: Weakness but otherwise nonfocal  IMPRESSION: Routine follow-up visit.  Patient reports that the vaginal bleeding had improved but started again last night.  She did have consultation visit with Dr. Delana Meyer and will be referred back to him for consideration of ablation.  Patient says that she did not sleep well last night due to urinary burning and dysuria.  She does report 24 hours of increased urinary frequency.  Would ideally check UA to rule out infection but patient has so much bleeding that  there would likely be poor yield to that test.  Given ureteral stent, will need to treat as complicated cystitis.  Discussed with Dr. Grayland Ormond will empirically treat.  PLAN: -Continue current scope of treatment -Continue MS Contin 15 mg every 8 hours -Continue oxycodone IR 5 to 10 mg every 3-4 hours as needed for breakthrough pain -Start Cipro 500 mg p.o. twice daily x10 days -Pyridium 100 mg 3 times daily as needed -RTC next week  Case and plan discussed with Dr. Grayland Ormond  Patient expressed understanding and was in agreement with this plan. She also understands that She can call the clinic at any time with any questions, concerns, or complaints.     Time Total: 15 minutes  Visit consisted of counseling and education dealing with the complex and emotionally intense issues of symptom management and palliative care in the setting of serious and potentially life-threatening illness.Greater than 50%  of this time was spent counseling and coordinating care related to the above assessment and plan.  Signed by: Altha Harm, PhD, NP-C [phone] (Work Cell)

## 2019-12-25 ENCOUNTER — Inpatient Hospital Stay (HOSPITAL_BASED_OUTPATIENT_CLINIC_OR_DEPARTMENT_OTHER): Payer: BC Managed Care – PPO | Admitting: Hospice and Palliative Medicine

## 2019-12-25 DIAGNOSIS — Z515 Encounter for palliative care: Secondary | ICD-10-CM

## 2019-12-25 NOTE — Progress Notes (Signed)
Was unable to reach patient at time of scheduled visit.  Was also unable to leave a voicemail.  Will reschedule visit.

## 2019-12-27 NOTE — Progress Notes (Deleted)
[place]  [phone] Fax:(336) [phone]  ID: Mariah Mcmahon OB: 1966/01/19  MR#: [id number]  ACZ#:[id number]  Patient Care Team: Venita Lick, NP as PCP - General (Nurse Practitioner) Clent Jacks, RN as Oncology Nurse Navigator   CHIEF COMPLAINT: Progressive stage IV cervical cancer  INTERVAL HISTORY: Patient returns to clinic today for repeat laboratory can further evaluation.  Her vaginal bleeding had improved this past week, but just this morning she has noticed an increased amount.  She continues to have worsening weakness and fatigue. She continues to have nausea and increased pain, particularly with urination. She has no neurologic complaints.  She denies any fevers.  She has no chest pain, shortness of breath, cough, or hemoptysis.  She denies any vomiting, constipation, or diarrhea.  Patient offers no further specific complaints today.  REVIEW OF SYSTEMS:   Review of Systems  Constitutional: Positive for malaise/fatigue. Negative for fever and weight loss.  Respiratory: Negative.  Negative for cough and shortness of breath.   Cardiovascular: Negative.  Negative for chest pain and leg swelling.  Gastrointestinal: Positive for nausea. Negative for abdominal pain and constipation.  Genitourinary: Positive for dysuria. Negative for flank pain and hematuria.       Vaginal bleeding.  Musculoskeletal: Negative.  Negative for back pain.  Skin: Negative.  Negative for rash.  Neurological: Positive for weakness. Negative for dizziness, focal weakness and headaches.  Psychiatric/Behavioral: The patient is nervous/anxious. The patient does not have insomnia.     As per HPI. Otherwise, a complete review of systems is negative.  PAST MEDICAL HISTORY: Past Medical History:  Diagnosis Date  . Anemia   . Cancer ([place])    cwervical cancer  . Cervical cancer ([place])   . No pertinent past medical history     PAST SURGICAL HISTORY: Past Surgical  History:  Procedure Laterality Date  . CYSTOSCOPY W/ URETERAL STENT PLACEMENT Right 10/02/2019   Procedure: CYSTOSCOPY WITH RETROGRADE PYELOGRAM/URETERAL STENT PLACEMENT;  Surgeon: Hollice Espy, MD;  Location: [facility] ORS;  Service: Urology;  Laterality: Right;  . IVC FILTER REMOVAL N/A 08/07/2019   Procedure: IVC FILTER REMOVAL;  Surgeon: Katha Cabal, MD;  Location: [place] [facility];  Service: Cardiovascular;  Laterality: N/A;  . MASS EXCISION     Throat  . PERIPHERAL VASCULAR THROMBECTOMY Left 06/05/2019   Procedure: PERIPHERAL VASCULAR THROMBECTOMY WITH IVC FILTER (LEFT LOWER EXTREMITY);  Surgeon: Katha Cabal, MD;  Location: [place] [facility];  Service: Cardiovascular;  Laterality: Left;  . PORTA CATH INSERTION N/A 06/06/2019   Procedure: PORTA CATH INSERTION;  Surgeon: Katha Cabal, MD;  Location: [place] [facility];  Service: Cardiovascular;  Laterality: N/A;  . TONSILLECTOMY      FAMILY HISTORY: Family History  Problem Relation Age of Onset  . Brain cancer Mother 48  . Alcohol abuse Father   . Heart disease Father   . Prostate cancer Father   . Heart attack Father   . Diabetes Father     ADVANCED DIRECTIVES (Y/N):  N  HEALTH MAINTENANCE: Social History   Tobacco Use  . Smoking status: Former Smoker    Packs/day: 0.50    Years: 30.00    Pack years: 15.00    Types: Cigarettes    Quit date: 07/03/2019    Years since quitting: 0.4  . Smokeless tobacco: Never Used  Substance Use Topics  . Alcohol use: Not Currently  . Drug use: Never     Colonoscopy:  PAP:  Bone density:  Lipid panel:  No Known Allergies  Current Outpatient Medications  Medication Sig Dispense Refill  . ALPRAZolam (XANAX) 0.25 MG tablet Take 1 tablet (0.25 mg total) by mouth 2 (two) times daily as needed for anxiety. 45 tablet 0  . apixaban (ELIQUIS) 5 MG TABS tablet Take 1 tablet (5 mg total) by mouth 2 (two) times daily. (Patient not taking: Reported on 12/13/2019) 180  tablet 1  . ciprofloxacin (CIPRO) 500 MG tablet Take 1 tablet (500 mg total) by mouth 2 (two) times daily. 14 tablet 0  . dexamethasone (DECADRON) 4 MG tablet Take 1 tablet (4 mg total) by mouth daily. (Patient not taking: Reported on 12/13/2019) 14 tablet 0  . lactulose (CHRONULAC) 10 GM/15ML solution Take 15-30 mLs (10-20 g total) by mouth daily as needed (constipation). (Patient not taking: Reported on 12/13/2019) 236 mL 0  . mirtazapine (REMERON) 7.5 MG tablet Take 1 tablet (7.5 mg total) by mouth at bedtime. (Patient not taking: Reported on 12/13/2019) 30 tablet 2  . morphine (MS CONTIN) 15 MG 12 hr tablet Take 1 tablet (15 mg total) by mouth every 8 (eight) hours. 90 tablet 0  . ondansetron (ZOFRAN) 8 MG tablet Take 1 tablet (8 mg total) by mouth every 8 (eight) hours as needed for nausea or vomiting. 45 tablet 0  . oxybutynin (DITROPAN-XL) 5 MG 24 hr tablet TAKE 1 TABLET (5 MG TOTAL) BY MOUTH AT BEDTIME 30 tablet 0  . oxyCODONE (OXY IR/ROXICODONE) 5 MG immediate release tablet Take 2 tablets (10 mg total) by mouth every 4 (four) hours as needed for severe pain. 60 tablet 0  . phenazopyridine (PYRIDIUM) 100 MG tablet Take 1 tablet (100 mg total) by mouth 3 (three) times daily as needed for pain. 10 tablet 0  . promethazine (PHENERGAN) 25 MG tablet Take 1 tablet (25 mg total) by mouth every 6 (six) hours as needed for nausea or vomiting. (Patient not taking: Reported on 12/13/2019) 30 tablet 0  . sodium phosphate (FLEET) 7-19 GM/118ML ENEM Place 133 mLs (1 enema total) rectally daily as needed for severe constipation. (Patient not taking: Reported on 12/13/2019) 2 enema 0  . tamsulosin (FLOMAX) 0.4 MG CAPS capsule TAKE 1 CAPSULE BY MOUTH EVERY DAY 30 capsule 1   No current facility-administered medications for this visit.   Facility-Administered Medications Ordered in Other Visits  Medication Dose Route Frequency Provider Last Rate Last Admin  . heparin lock flush 100 unit/mL  500 Units Intracatheter  Once PRN Lloyd Huger, MD      . sodium chloride flush (NS) 0.9 % injection 10 mL  10 mL Intravenous PRN Lloyd Huger, MD   10 mL at 11/27/19 1028    OBJECTIVE: There were no vitals filed for this visit.   There is no height or weight on file to calculate BMI.    ECOG FS:2 - Symptomatic, <50% confined to bed  General: Thin, no acute distress. Eyes: Pink conjunctiva, anicteric sclera. HEENT: Normocephalic, moist mucous membranes. Lungs: No audible wheezing or coughing. Heart: Regular rate and rhythm. Abdomen: Soft, nontender, no obvious distention. Musculoskeletal: No edema, cyanosis, or clubbing. Neuro: Alert, answering all questions appropriately. Cranial nerves grossly intact. Skin: No rashes or petechiae noted. Psych: Normal affect.  LAB RESULTS:  Lab Results  Component Value Date   NA 133 (L) 12/18/2019   K 3.5 12/18/2019   CL 96 (L) 12/18/2019   CO2 25 12/18/2019   GLUCOSE 144 (H) 12/18/2019   BUN 11  12/18/2019   CREATININE 0.83 12/18/2019   CALCIUM 10.7 (H) 12/18/2019   PROT 6.8 12/18/2019   ALBUMIN 2.8 (L) 12/18/2019   AST 102 (H) 12/18/2019   ALT 31 12/18/2019   ALKPHOS 544 (H) 12/18/2019   BILITOT 1.2 12/18/2019   GFRNONAA >60 12/18/2019   GFRAA >60 12/18/2019    Lab Results  Component Value Date   WBC 8.0 12/18/2019   NEUTROABS 6.6 12/18/2019   HGB 10.6 (L) 12/18/2019   HCT 34.1 (L) 12/18/2019   MCV 90.5 12/18/2019   PLT 407 (H) 12/18/2019     STUDIES: No results found.  ASSESSMENT: Progressive stage IV cervical cancer.  PLAN:    1.  Progressive stage IV cervical cancer: Biopsy results from April 24, 2019 confirmed the diagnosis. PET scan on September 17, 2019 revealed progressive disease.  Initial plan was to enroll in clinical trial at [facility], but secondary to significant urine protein patient did not qualify.  PD-L1 was found to be positive (3%, CPS is greater than 1).  Patient received cycle 3 of Keytruda last week.   Gynecologic exam from last week as well suggested possible progression of disease, will get CT scan prior to cycle 4 of Keytruda in 2 weeks. 2.  Hydronephrosis: Imaging as above.  Ureteral stent appears to be appropriately in place. 3.  Venous access: Patient now has had a port placed. 4.  Renal insufficiency: Resolved.   5.  Anemia: Significantly improved to 10.6, monitor. 6.  Abdominal/flank pain: Continue current narcotics as prescribed.  Appreciate palliative care input. 7.  DVT/PE: Diagnosed in July 2020.  Patient required stent placement and thrombolytics.  IVC filter has been removed.  Given extensive bleeding, Eliquis has been discontinued. 8.  Thrombocytopenia: Resolved. 9.  Anxiety: Continue Xanax as prescribed. 10.  Poor appetite/insomnia: Continue Remeron. 11.  Vaginal bleeding: Patient has now completed XRT.  Eliquis has been discontinued.  Refer back to vascular surgery for consideration of embolization. 12.  Constipation: Resolved.  Continue current bowel regimen. 13. Burning with urination: Patient was given a prescription for antibiotic and Pyridium. 14.  Declining performance status: Patient expressed understanding that there may be no additional treatment options, will reevaluate after CT scan as above.  Appreciate palliative care input. 15.  Hypercalcemia: Likely secondary to underlying malignancy, consider Zometa in the future.  Patient expressed understanding and was in agreement with this plan. She also understands that She can call clinic at any time with any questions, concerns, or complaints.   Cancer Staging Cervical cancer [facility]) Staging form: Cervix Uteri, AJCC 8th Edition - Clinical stage from 11/17/2019: FIGO Stage IVB (cT3b, cN1, pM1) - Signed by Lloyd Huger, MD on 11/17/2019   Lloyd Huger, MD   12/27/2019 2:44 PM

## 2019-12-29 ENCOUNTER — Other Ambulatory Visit: Payer: Self-pay | Admitting: Oncology

## 2019-12-30 ENCOUNTER — Other Ambulatory Visit: Payer: Self-pay | Admitting: Oncology

## 2019-12-31 ENCOUNTER — Ambulatory Visit: Admission: RE | Admit: 2019-12-31 | Payer: BC Managed Care – PPO | Source: Ambulatory Visit

## 2019-12-31 MED ORDER — OXYCODONE HCL 5 MG PO TABS: 10.0000 mg | ORAL_TABLET | ORAL | 0 refills | Status: DC | PRN

## 2020-01-01 ENCOUNTER — Inpatient Hospital Stay: Payer: BC Managed Care – PPO | Admitting: Hospice and Palliative Medicine

## 2020-01-01 ENCOUNTER — Inpatient Hospital Stay: Payer: BC Managed Care – PPO | Admitting: Nurse Practitioner

## 2020-01-01 ENCOUNTER — Telehealth: Payer: Self-pay | Admitting: Nurse Practitioner

## 2020-01-01 ENCOUNTER — Telehealth: Payer: Self-pay | Admitting: *Deleted

## 2020-01-01 ENCOUNTER — Ambulatory Visit: Payer: BC Managed Care – PPO

## 2020-01-01 NOTE — Telephone Encounter (Signed)
I'll fax it as soon as Finn signs the referral. Thanks

## 2020-01-01 NOTE — Telephone Encounter (Signed)
Patient aunt Pamala Hurry called and reports that patient was unable to drink CT contrast this morning and so she cancelled her CT for this morning. She is concerned about patient, she just lies in bed and won't eat, stating "I am sick and in pain" She states she is weak and doe snot think she will be able to get patient to her appointment tomorrow either. She states she thinks the patient is depressed as she just recently signed a DNR. She is asking what she needs to do. Please advise

## 2020-01-01 NOTE — Telephone Encounter (Signed)
See phone note. Hassan Rowan- please add encounters for palliative care and symptom management. Family trying to bring patient now. Thanks!

## 2020-01-01 NOTE — Telephone Encounter (Signed)
Called patient but vm full and unable to leave message. Called [place]. She stayed with patient last night. Patient did not drink contrast liquid and they cancelled scan. Per Coral Ceo has not eaten in several days, is quite weak, experiencing significant pain unrelieved by current pain regimen. Family will try to get patient to cancer center and otherwise will call 911 to have ambulance transport to ER. Dr. Grayland Ormond & Billey Chang, NP updated.

## 2020-01-01 NOTE — Telephone Encounter (Signed)
Mariah Mcmahon called asking for a hospice referral She states that she will be moving in with her son Harrell Gave [phone] later this evening. His address is [place] [phone]

## 2020-01-01 NOTE — Telephone Encounter (Signed)
Appts entered

## 2020-01-01 NOTE — Telephone Encounter (Signed)
I called and spoke with patient's aunt Pamala Hurry.  She describes patient as declining.  Oral intake is minimal.  Patient has had progressive weakness.  Family are unsure if they can get her to the clinic today for evaluation.  We discussed ER utilization but likely symptoms are secondary to disease progression.  We also discussed option of hospice either at home or residential hospice facility.  I offered a home visit if that would help with decision-making.  Her aunt says that she will let us know what patient/family decide.

## 2020-01-02 ENCOUNTER — Inpatient Hospital Stay: Payer: BC Managed Care – PPO | Admitting: Oncology

## 2020-01-02 ENCOUNTER — Inpatient Hospital Stay: Payer: BC Managed Care – PPO

## 2020-01-02 ENCOUNTER — Other Ambulatory Visit: Payer: Self-pay | Admitting: *Deleted

## 2020-01-02 ENCOUNTER — Inpatient Hospital Stay: Payer: BC Managed Care – PPO | Admitting: Hospice and Palliative Medicine

## 2020-01-02 ENCOUNTER — Ambulatory Visit: Payer: BC Managed Care – PPO | Admitting: Urology

## 2020-01-02 DIAGNOSIS — C539 Malignant neoplasm of cervix uteri, unspecified: Secondary | ICD-10-CM | POA: Diagnosis not present

## 2020-01-02 MED ORDER — POLYETHYLENE GLYCOL 3350 17 GM/SCOOP PO POWD: 1.0000 | Freq: Once | ORAL | 0 refills | Status: AC

## 2020-01-02 NOTE — Telephone Encounter (Signed)
Hospice called asking for prescription for Miralax for this patient.

## 2020-01-03 ENCOUNTER — Telehealth: Payer: Self-pay | Admitting: *Deleted

## 2020-01-03 ENCOUNTER — Other Ambulatory Visit: Payer: Self-pay | Admitting: Hospice and Palliative Medicine

## 2020-01-03 DIAGNOSIS — C539 Malignant neoplasm of cervix uteri, unspecified: Secondary | ICD-10-CM | POA: Diagnosis not present

## 2020-01-03 MED ORDER — MORPHINE SULFATE ER 15 MG PO TBCR: 30.0000 mg | EXTENDED_RELEASE_TABLET | Freq: Two times a day (BID) | ORAL | 0 refills | Status: AC

## 2020-01-03 MED ORDER — MORPHINE SULFATE (CONCENTRATE) 10 MG /0.5 ML PO SOLN: 10.0000 mg | ORAL | 0 refills | Status: AC | PRN

## 2020-01-03 MED ORDER — OXYCODONE HCL 5 MG PO TABS: 10.0000 mg | ORAL_TABLET | ORAL | 0 refills | Status: AC | PRN

## 2020-01-03 NOTE — Progress Notes (Signed)
I spoke with the on-call hospice nurse regarding patient's pain.  Reportedly patient has had persistent generalized pain which is rated as severe.  Will increase MS Contin to 30 mg every 12 hours with plan that patient can then increase to 30 mg every 8 hours in a few days if needed.  Will increase frequency of oxycodone 10 mg to every 3 hours from every 4 hours as needed.  Per hospice request, I will also send in a prescription for morphine elixir to have as needed in the event that swallowing becomes impaired.

## 2020-01-03 NOTE — Telephone Encounter (Signed)
I called and spoke with on-call hospice nurse.  Orders entered.

## 2020-01-03 NOTE — Telephone Encounter (Signed)
Deandra called reporting that patient needs refill of her pain medications Oxycodone and MS ER, but they are not controlling her pain, 4 hours post taking last Oxy dose of 10 mg she is rating her pain at 0/10. She is requesting an increase of her MS ER 15 mg and her Oxycodone as well as prescription for Roxanol for this patient. Please advise Deandras call back is [phone]

## 2020-01-04 DIAGNOSIS — C539 Malignant neoplasm of cervix uteri, unspecified: Secondary | ICD-10-CM | POA: Diagnosis not present

## 2020-01-05 ENCOUNTER — Other Ambulatory Visit: Payer: Self-pay | Admitting: Oncology

## 2020-01-05 DIAGNOSIS — C539 Malignant neoplasm of cervix uteri, unspecified: Secondary | ICD-10-CM | POA: Diagnosis not present

## 2020-01-05 DIAGNOSIS — N1330 Unspecified hydronephrosis: Secondary | ICD-10-CM

## 2020-01-06 DIAGNOSIS — C539 Malignant neoplasm of cervix uteri, unspecified: Secondary | ICD-10-CM | POA: Diagnosis not present

## 2020-01-07 DIAGNOSIS — C539 Malignant neoplasm of cervix uteri, unspecified: Secondary | ICD-10-CM | POA: Diagnosis not present

## 2020-01-08 DIAGNOSIS — C539 Malignant neoplasm of cervix uteri, unspecified: Secondary | ICD-10-CM | POA: Diagnosis not present

## 2020-01-09 ENCOUNTER — Encounter (INDEPENDENT_AMBULATORY_CARE_PROVIDER_SITE_OTHER): Payer: Self-pay | Admitting: Nurse Practitioner

## 2020-01-09 DIAGNOSIS — C539 Malignant neoplasm of cervix uteri, unspecified: Secondary | ICD-10-CM | POA: Diagnosis not present

## 2020-01-10 DIAGNOSIS — C539 Malignant neoplasm of cervix uteri, unspecified: Secondary | ICD-10-CM | POA: Diagnosis not present

## 2020-01-11 DIAGNOSIS — C539 Malignant neoplasm of cervix uteri, unspecified: Secondary | ICD-10-CM | POA: Diagnosis not present

## 2020-01-21 ENCOUNTER — Other Ambulatory Visit: Payer: Self-pay | Admitting: Oncology

## 2020-01-21 ENCOUNTER — Ambulatory Visit: Payer: BC Managed Care – PPO | Admitting: Radiation Oncology

## 2020-01-21 DIAGNOSIS — N1330 Unspecified hydronephrosis: Secondary | ICD-10-CM

## 2020-01-23 ENCOUNTER — Ambulatory Visit: Payer: BC Managed Care – PPO

## 2020-01-27 IMAGING — PT NM PET TUM IMG RESTAG (PS) SKULL BASE T - THIGH
1 of 10 series · 1 of 25 positions shown · non-contrast
Comparison: 05/07/2019 PET-CT.

CLINICAL DATA: Subsequent treatment strategy for stage IIIB
cervical cancer. Completed radiation therapy 07/03/2019. Concurrent
chemotherapy.

EXAM:
NUCLEAR MEDICINE PET SKULL BASE TO THIGH
TECHNIQUE: 5.8 mCi F-18 FDG was injected intravenously. Full-ring PET imaging
was performed from the skull base to thigh after the radiotracer. CT
data was obtained and used for attenuation correction and anatomic
localization.
Fasting blood glucose: 102 mg/dl

[Series 3: ct wb 5.0 b30f · axial · 5.0mm · 0.98mm/px · 1 of 290 slices shown]
[im 290/290  brain]
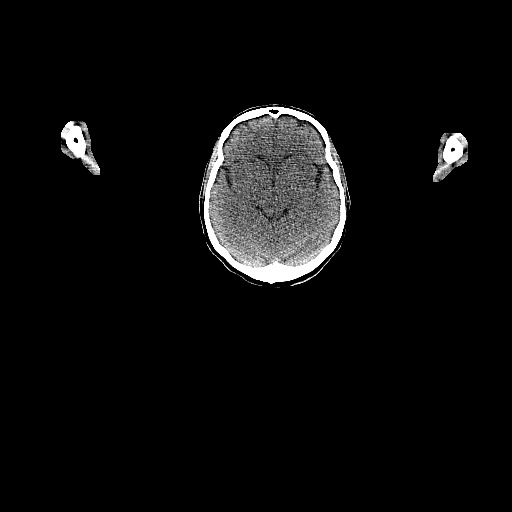

[1 of 25 positions shown; findings below may reference images not displayed]

FINDINGS: Mediastinal blood pool activity: SUV max

Liver activity: SUV max NA

NECK: There are multiple new mildly enlarged hypermetabolic left
supraclavicular lymph nodes, largest 1.0 cm with max SUV 8.0 (series
3/image 50).

Incidental CT findings: None.

CHEST:

Newly hypermetabolic high left paratracheal 0.7 cm lymph node with
max SUV 4.7 (series 3/image 64).

Newly hypermetabolic 0.7 cm subcarinal node with max SUV 6.0 (series
3/image 70).

Newly hypermetabolic 0.7 cm low right paratracheal node with max SUV
3.4 (series 3/image 76).

No hypermetabolic axillary or hilar lymph nodes. No hypermetabolic
pulmonary findings.

Incidental CT findings: Right internal jugular Port-A-Cath
terminates at the cavoatrial junction. Low cardiac blood pool
density indicates anemia. Right upper lobe 3 mm solid pulmonary
nodule (series 3/image 83), stable. Medial basilar left lower lobe 4
mm pulmonary nodule seen on prior PET-CT is not appreciated on
today's scan, potentially obscured by atelectasis. No acute
consolidative airspace disease or new significant pulmonary nodules.

ABDOMEN/PELVIS:

Previously visualized hypermetabolism within bilateral retrocrural
lymph nodes and bilateral iliac lymph nodes has resolved.

Substantially decreased hypermetabolism within retroperitoneal lymph
nodes involving the aortocaval and left para-aortic chains. Residual
mild hypermetabolism within a 1.6 cm left para-aortic node with max
SUV 3.1 (series 3/image 140), previously 2.9 cm with max SUV 12.8.
Residual mild hypermetabolism within a 0.9 cm aortocaval node with
max SUV 2.9 (series 3/image 142), previously 2.1 cm with max SUV
9.9.

No residual hypermetabolism within the previously noted 6.1 x 3.4 cm
soft tissue implant in the anterior left pelvis, now measuring 0.7 x
0.6 cm (series 3/image 211).

Nearly resolved hypermetabolism within cervix and uterus, with tiny
residual focus of hypermetabolism in the posterior upper cervix with
max SUV 4.7, previous max SUV 19.1.

No abnormal hypermetabolic activity within the liver, pancreas,
adrenal glands, or spleen.

Incidental CT findings: Infrarenal IVC filter and left common iliac
vein stent are in place.

SKELETON: No focal hypermetabolic activity to suggest skeletal
metastasis.

Incidental CT findings: Stable mixed lytic and sclerotic left sacral
ala or lesion without significant metabolic uptake.
IMPRESSION: 1. Mixed treatment response.
2. New hypermetabolic left supraclavicular, high left paratracheal,
low right paratracheal and subcarinal lymphadenopathy, compatible
with progression of metastatic nodal disease in the chest and left
lower neck.
3. Substantially decreased hypermetabolism within retroperitoneal
nodal metastases. Resolved hypermetabolism within retrocrural and
pelvic nodal metastases.
4. Near complete resolution of hypermetabolism within the cervix and
uterus. Resolved hypermetabolism within anterior low left pelvic
peritoneal metastasis.
5. No growth of tiny pulmonary nodules, which are below PET
resolution.

## 2020-02-07 DEATH — deceased
